# Patient Record
Sex: Male | Born: 1945 | Race: White | Hispanic: No | Smoking: Never smoker
Health system: Southern US, Community
[De-identification: ages and names within clinical notes are randomized; demographics above are authoritative.]

## PROBLEM LIST (undated history)

## (undated) DIAGNOSIS — C801 Malignant (primary) neoplasm, unspecified: Secondary | ICD-10-CM

## (undated) DIAGNOSIS — Z973 Presence of spectacles and contact lenses: Secondary | ICD-10-CM

## (undated) DIAGNOSIS — G8929 Other chronic pain: Secondary | ICD-10-CM

## (undated) DIAGNOSIS — I10 Essential (primary) hypertension: Secondary | ICD-10-CM

## (undated) DIAGNOSIS — H269 Unspecified cataract: Secondary | ICD-10-CM

## (undated) DIAGNOSIS — G43909 Migraine, unspecified, not intractable, without status migrainosus: Secondary | ICD-10-CM

## (undated) DIAGNOSIS — Z8489 Family history of other specified conditions: Secondary | ICD-10-CM

## (undated) DIAGNOSIS — I509 Heart failure, unspecified: Secondary | ICD-10-CM

## (undated) DIAGNOSIS — M545 Low back pain, unspecified: Secondary | ICD-10-CM

## (undated) DIAGNOSIS — I2510 Atherosclerotic heart disease of native coronary artery without angina pectoris: Secondary | ICD-10-CM

## (undated) DIAGNOSIS — G2581 Restless legs syndrome: Secondary | ICD-10-CM

## (undated) DIAGNOSIS — M1990 Unspecified osteoarthritis, unspecified site: Secondary | ICD-10-CM

## (undated) DIAGNOSIS — E7800 Pure hypercholesterolemia, unspecified: Secondary | ICD-10-CM

## (undated) HISTORY — PX: EYE SURGERY: SHX253

## (undated) HISTORY — PX: LUMBAR DISC SURGERY: SHX700

## (undated) HISTORY — PX: KNEE ARTHROSCOPY: SUR90

## (undated) HISTORY — PX: COLONOSCOPY: SHX174

## (undated) HISTORY — PX: BACK SURGERY: SHX140

## (undated) HISTORY — PX: JOINT REPLACEMENT: SHX530

## (undated) HISTORY — PX: FRACTURE SURGERY: SHX138

## (undated) HISTORY — PX: ANTERIOR CERVICAL DECOMP/DISCECTOMY FUSION: SHX1161

## (undated) HISTORY — PX: ANTERIOR CERVICAL DISCECTOMY: SHX1160

---

## 1999-04-22 ENCOUNTER — Encounter: Admission: RE | Admit: 1999-04-22 | Discharge: 1999-07-12 | Payer: Self-pay | Admitting: Specialist

## 1999-07-15 ENCOUNTER — Encounter: Payer: Self-pay | Admitting: Specialist

## 1999-07-15 ENCOUNTER — Ambulatory Visit (HOSPITAL_COMMUNITY): Admission: RE | Admit: 1999-07-15 | Discharge: 1999-07-15 | Payer: Self-pay | Admitting: Specialist

## 1999-07-29 ENCOUNTER — Ambulatory Visit (HOSPITAL_COMMUNITY): Admission: RE | Admit: 1999-07-29 | Discharge: 1999-07-29 | Payer: Self-pay | Admitting: Specialist

## 1999-07-29 ENCOUNTER — Encounter: Payer: Self-pay | Admitting: Specialist

## 2000-02-07 ENCOUNTER — Encounter: Payer: Self-pay | Admitting: Neurology

## 2000-02-07 ENCOUNTER — Ambulatory Visit (HOSPITAL_COMMUNITY): Admission: RE | Admit: 2000-02-07 | Discharge: 2000-02-07 | Payer: Self-pay | Admitting: Neurology

## 2002-07-15 ENCOUNTER — Encounter: Payer: Self-pay | Admitting: Specialist

## 2002-07-18 ENCOUNTER — Encounter: Payer: Self-pay | Admitting: Specialist

## 2002-07-18 ENCOUNTER — Inpatient Hospital Stay (HOSPITAL_COMMUNITY): Admission: RE | Admit: 2002-07-18 | Discharge: 2002-07-19 | Payer: Self-pay | Admitting: Specialist

## 2003-11-18 ENCOUNTER — Encounter: Admission: RE | Admit: 2003-11-18 | Discharge: 2003-11-18 | Payer: Self-pay | Admitting: Specialist

## 2004-02-01 ENCOUNTER — Encounter: Admission: RE | Admit: 2004-02-01 | Discharge: 2004-02-01 | Payer: Self-pay | Admitting: Specialist

## 2004-11-18 ENCOUNTER — Ambulatory Visit (HOSPITAL_COMMUNITY): Admission: RE | Admit: 2004-11-18 | Discharge: 2004-11-19 | Payer: Self-pay | Admitting: Specialist

## 2005-09-05 ENCOUNTER — Encounter: Admission: RE | Admit: 2005-09-05 | Discharge: 2005-09-05 | Payer: Self-pay | Admitting: Specialist

## 2006-01-19 ENCOUNTER — Inpatient Hospital Stay (HOSPITAL_COMMUNITY): Admission: RE | Admit: 2006-01-19 | Discharge: 2006-01-20 | Payer: Self-pay | Admitting: Specialist

## 2006-04-11 ENCOUNTER — Ambulatory Visit (HOSPITAL_COMMUNITY): Admission: RE | Admit: 2006-04-11 | Discharge: 2006-04-11 | Payer: Self-pay | Admitting: Specialist

## 2006-08-25 ENCOUNTER — Encounter: Admission: RE | Admit: 2006-08-25 | Discharge: 2006-08-25 | Payer: Self-pay | Admitting: Specialist

## 2006-12-25 ENCOUNTER — Encounter: Admission: RE | Admit: 2006-12-25 | Discharge: 2006-12-25 | Payer: Self-pay | Admitting: Specialist

## 2007-01-25 ENCOUNTER — Encounter: Admission: RE | Admit: 2007-01-25 | Discharge: 2007-01-25 | Payer: Self-pay | Admitting: Family Medicine

## 2007-02-06 ENCOUNTER — Encounter: Admission: RE | Admit: 2007-02-06 | Discharge: 2007-02-06 | Payer: Self-pay | Admitting: Specialist

## 2007-02-08 ENCOUNTER — Inpatient Hospital Stay (HOSPITAL_COMMUNITY): Admission: RE | Admit: 2007-02-08 | Discharge: 2007-02-14 | Payer: Self-pay | Admitting: Specialist

## 2007-02-08 HISTORY — PX: MAXIMUM ACCESS (MAS)POSTERIOR LUMBAR INTERBODY FUSION (PLIF) 3 LEVEL: SHX6370

## 2007-06-18 ENCOUNTER — Encounter: Admission: RE | Admit: 2007-06-18 | Discharge: 2007-07-03 | Payer: Self-pay | Admitting: Specialist

## 2007-07-10 ENCOUNTER — Encounter: Admission: RE | Admit: 2007-07-10 | Discharge: 2007-08-12 | Payer: Self-pay | Admitting: Specialist

## 2007-07-15 ENCOUNTER — Encounter: Admission: RE | Admit: 2007-07-15 | Discharge: 2007-08-12 | Payer: Self-pay | Admitting: Specialist

## 2007-09-17 ENCOUNTER — Encounter: Admission: RE | Admit: 2007-09-17 | Discharge: 2007-11-19 | Payer: Self-pay | Admitting: Specialist

## 2008-03-05 ENCOUNTER — Encounter: Admission: RE | Admit: 2008-03-05 | Discharge: 2008-03-05 | Payer: Self-pay | Admitting: Specialist

## 2008-10-02 ENCOUNTER — Encounter: Admission: RE | Admit: 2008-10-02 | Discharge: 2008-10-02 | Payer: Self-pay | Admitting: Family Medicine

## 2009-05-11 ENCOUNTER — Encounter: Admission: RE | Admit: 2009-05-11 | Discharge: 2009-05-11 | Payer: Self-pay | Admitting: Specialist

## 2009-08-05 ENCOUNTER — Encounter
Admission: RE | Admit: 2009-08-05 | Discharge: 2009-09-27 | Payer: Self-pay | Admitting: Physical Medicine & Rehabilitation

## 2009-08-10 ENCOUNTER — Ambulatory Visit: Payer: Self-pay | Admitting: Physical Medicine & Rehabilitation

## 2009-09-27 ENCOUNTER — Ambulatory Visit: Payer: Self-pay | Admitting: Physical Medicine & Rehabilitation

## 2009-10-28 ENCOUNTER — Ambulatory Visit: Payer: Self-pay | Admitting: Physical Medicine & Rehabilitation

## 2009-10-28 ENCOUNTER — Encounter
Admission: RE | Admit: 2009-10-28 | Discharge: 2009-10-28 | Payer: Self-pay | Admitting: Physical Medicine & Rehabilitation

## 2009-11-22 ENCOUNTER — Encounter
Admission: RE | Admit: 2009-11-22 | Discharge: 2009-11-25 | Payer: Self-pay | Admitting: Physical Medicine & Rehabilitation

## 2009-11-25 ENCOUNTER — Ambulatory Visit: Payer: Self-pay | Admitting: Physical Medicine & Rehabilitation

## 2010-02-14 ENCOUNTER — Encounter
Admission: RE | Admit: 2010-02-14 | Discharge: 2010-02-14 | Payer: Self-pay | Admitting: Physical Medicine & Rehabilitation

## 2010-04-19 ENCOUNTER — Inpatient Hospital Stay (HOSPITAL_COMMUNITY): Admission: RE | Admit: 2010-04-19 | Discharge: 2010-04-22 | Payer: Self-pay | Admitting: Specialist

## 2010-05-17 ENCOUNTER — Encounter
Admission: RE | Admit: 2010-05-17 | Discharge: 2010-06-29 | Payer: Self-pay | Source: Home / Self Care | Attending: Specialist | Admitting: Specialist

## 2010-07-05 ENCOUNTER — Encounter
Admission: RE | Admit: 2010-07-05 | Discharge: 2010-08-01 | Payer: Self-pay | Source: Home / Self Care | Attending: Specialist | Admitting: Specialist

## 2010-07-24 ENCOUNTER — Encounter: Payer: Self-pay | Admitting: Specialist

## 2010-08-29 ENCOUNTER — Ambulatory Visit: Payer: Medicare Other | Attending: Specialist | Admitting: Physical Therapy

## 2010-08-29 DIAGNOSIS — M25519 Pain in unspecified shoulder: Secondary | ICD-10-CM | POA: Insufficient documentation

## 2010-08-29 DIAGNOSIS — M25619 Stiffness of unspecified shoulder, not elsewhere classified: Secondary | ICD-10-CM | POA: Insufficient documentation

## 2010-08-29 DIAGNOSIS — IMO0001 Reserved for inherently not codable concepts without codable children: Secondary | ICD-10-CM | POA: Insufficient documentation

## 2010-09-06 ENCOUNTER — Ambulatory Visit: Payer: Medicare Other | Attending: Specialist | Admitting: Physical Therapy

## 2010-09-06 DIAGNOSIS — M25619 Stiffness of unspecified shoulder, not elsewhere classified: Secondary | ICD-10-CM | POA: Insufficient documentation

## 2010-09-06 DIAGNOSIS — IMO0001 Reserved for inherently not codable concepts without codable children: Secondary | ICD-10-CM | POA: Insufficient documentation

## 2010-09-06 DIAGNOSIS — M25519 Pain in unspecified shoulder: Secondary | ICD-10-CM | POA: Insufficient documentation

## 2010-09-09 ENCOUNTER — Ambulatory Visit: Payer: Medicare Other | Admitting: Physical Therapy

## 2010-09-14 LAB — CBC
HCT: 32 % — ABNORMAL LOW (ref 39.0–52.0)
Hemoglobin: 10.7 g/dL — ABNORMAL LOW (ref 13.0–17.0)
Hemoglobin: 9.7 g/dL — ABNORMAL LOW (ref 13.0–17.0)
MCH: 31.2 pg (ref 26.0–34.0)
MCHC: 32.8 g/dL (ref 30.0–36.0)
MCHC: 32.9 g/dL (ref 30.0–36.0)
MCV: 94.9 fL (ref 78.0–100.0)
Platelets: 152 10*3/uL (ref 150–400)
Platelets: 185 10*3/uL (ref 150–400)
RBC: 3.11 MIL/uL — ABNORMAL LOW (ref 4.22–5.81)
RBC: 3.41 MIL/uL — ABNORMAL LOW (ref 4.22–5.81)
RDW: 13.2 % (ref 11.5–15.5)
WBC: 11.1 10*3/uL — ABNORMAL HIGH (ref 4.0–10.5)
WBC: 17.7 10*3/uL — ABNORMAL HIGH (ref 4.0–10.5)

## 2010-09-14 LAB — URINALYSIS, ROUTINE W REFLEX MICROSCOPIC
Glucose, UA: NEGATIVE mg/dL
Protein, ur: 30 mg/dL — AB
Specific Gravity, Urine: 1.019 (ref 1.005–1.030)
Urobilinogen, UA: 0.2 mg/dL (ref 0.0–1.0)

## 2010-09-14 LAB — HEMOGLOBIN A1C
Hgb A1c MFr Bld: 5.6 % (ref <5.7)
Mean Plasma Glucose: 114 mg/dL (ref <117)

## 2010-09-14 LAB — URINE CULTURE: Colony Count: NO GROWTH

## 2010-09-14 LAB — BASIC METABOLIC PANEL
Calcium: 7.8 mg/dL — ABNORMAL LOW (ref 8.4–10.5)
Creatinine, Ser: 1.02 mg/dL (ref 0.4–1.5)
GFR calc Af Amer: >60 mL/min (ref >60)
GFR calc non Af Amer: >60 mL/min (ref >60)
Sodium: 137 mEq/L (ref 135–145)

## 2010-09-14 LAB — PROTIME-INR
INR: 1.06 (ref 0.00–1.49)
INR: 1.2 (ref 0.00–1.49)
INR: 1.88 — ABNORMAL HIGH (ref 0.00–1.49)
Prothrombin Time: 15.4 seconds — ABNORMAL HIGH (ref 11.6–15.2)

## 2010-09-14 LAB — URINE MICROSCOPIC-ADD ON

## 2010-09-15 LAB — COMPREHENSIVE METABOLIC PANEL
ALT: 21 U/L (ref 0–53)
AST: 24 U/L (ref 0–37)
CO2: 29 mEq/L (ref 19–32)
Calcium: 9 mg/dL (ref 8.4–10.5)
Chloride: 107 mEq/L (ref 96–112)
GFR calc Af Amer: >60 mL/min (ref >60)
GFR calc non Af Amer: >60 mL/min (ref >60)
Potassium: 4 mEq/L (ref 3.5–5.1)
Sodium: 141 mEq/L (ref 135–145)
Total Bilirubin: 0.8 mg/dL (ref 0.3–1.2)

## 2010-09-15 LAB — DIFFERENTIAL
Eosinophils Absolute: 0.2 10*3/uL (ref 0.0–0.7)
Eosinophils Relative: 3 % (ref 0–5)
Lymphs Abs: 2.2 10*3/uL (ref 0.7–4.0)

## 2010-09-15 LAB — URINALYSIS, ROUTINE W REFLEX MICROSCOPIC
Glucose, UA: NEGATIVE mg/dL
Hgb urine dipstick: NEGATIVE
Specific Gravity, Urine: 1.011 (ref 1.005–1.030)

## 2010-09-15 LAB — SURGICAL PCR SCREEN
MRSA, PCR: NEGATIVE
Staphylococcus aureus: POSITIVE — AB

## 2010-09-15 LAB — CBC
Hemoglobin: 13 g/dL (ref 13.0–17.0)
RBC: 4.13 MIL/uL — ABNORMAL LOW (ref 4.22–5.81)
WBC: 7.3 10*3/uL (ref 4.0–10.5)

## 2010-11-15 NOTE — Op Note (Signed)
Brian Gallegos, Brian Gallegos            ACCOUNT NO.:  [id number]   MEDICAL RECORD NO.:  [id number]          PATIENT TYPE:  INP   LOCATION:  2550                         FACILITY:  [facility]   PHYSICIAN:  Kerrin Champagne, M.D.   DATE OF BIRTH:  [birth year]   DATE OF PROCEDURE:  02/08/2007  DATE OF DISCHARGE:                               OPERATIVE REPORT   PREOPERATIVE DIAGNOSES:  1. 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  2. Left L4-5 recurrent disk protrusion with foraminal stenosis and      foraminal component to disk protrusion.  3. Right L3-4 herniated nucleus pulposus far lateral and      intraforaminal.  4. Left L5-S1 lateral recess narrowing.  5. Small disk degenerative tear, left L5-S1.   POSTOPERATIVE DIAGNOSES:  1. 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  2. Left L4-5 recurrent disk protrusion with foraminal stenosis and      foraminal component to disk protrusion.  3. Right L3-4 herniated nucleus pulposus far lateral and      intraforaminal.  4. Left L5-S1 lateral recess narrowing.  5. Small disk degenerative tear, left L5-S1.   PROCEDURE:  1. Right sided L3-4 transforaminal lumbar interbody fusion using the 9      mm DePuy Concord cage, local bone graft.  2. Left L2-3 transforaminal lumbar interbody fusion using a 9 mm      Concord DePuy cage with local bone graft.  3. Left L4-5 transforaminal lumbar interbody fusion using a 10 mm      DePuy Concord cage with local bone graft.  4. Left L5-S1 lateral recess decompression, posterolateral fusion L2      to L5 (three levels).  5. Internal fixation with pedicle screws and rods, L2 to L5, total of      three segments.  DePuy Monarch pedicle screws and rods used.  6. Single cross clamp at the L3-4 level.   SURGEON:  Kerrin Champagne, M.D.   ASSISTANT:  Wende Neighbors, P.A.   ANESTHESIA:  General via oral tracheal intubation, Dr. Gypsy Balsam.   FINDINGS:  As above, left L2-3 and left L4-5  foraminal entrapment,  recurrent disk protrusion left L4-5 lateral recess.  Right L3-4  foraminal disk protrusion with stenosis.  Left L5-S1 lateral recess  narrowing.   SPECIMENS:  None.   DISPOSITION:  Not applicable.   ESTIMATED BLOOD LOSS:  750 mL.   The Cell Saver blood returned 2-1/2 units.   COMPLICATIONS:  None.   The patient's intraoperative neural monitoring indicated soft tissue  resistance for the pedicle screws greater than 30 at the L2 level, L3  level and right L5 level.  The L4 level at 28 on both sides and on the  left side L5 22, all greater than 8 indicating normal resistance for  pedicle screw placement.  No intraoperative neural monitoring  abnormality noted during this case.   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  He has undergone numerous  surgical procedures including left sided L3-4 extraforaminal approach to  excision  of HNP, multiple L4-5 disk excisions for recurring herniated  nucleus pulposus, a left sided L5-S1 diskectomy in the past and a disk  protrusion which has been treated conservatively on the left side at the  L2-3 level.  The patient has been experiencing worsening pain and disk  comfort following an injury on the job.  Pain has persisted into his  left lower extremity despite having undergone a recent repeat excision  of herniated nucleus pulposus felt to be recurrent along the left side  at the L4-5 level.  He has required continued narcotic medicines, did  return to work at his usual job duties, however, he has required  persistent use of medicines in the form of strong narcotic medicines and  has shown a tendency to escalation of these medicines.  It is apparent  on his follow-up studies that he has severe foraminal entrapment that is  present on the left side at both L2-3 and L4-5 on the right side at the  L3-4 level.  These are in such areas that disk excision  alone does not  appear to be an option and the patient will likely require excision of  joints in order to undergo excision of disk herniation and foraminal  decompression at each segment.  We therefore recommended that he go  ahead with a three level transforaminal lumbar interbody fusion  procedure with disk excision and foraminal decompression at each segment  in the areas of foraminal disk protrusion, that would be in the left L2-  3, left L4-5, and right L3-4.  His studies dating into June do indicate  some lateral recess narrowing on the left side at the L5-S1 level.   This being the case, I recommended that he go ahead and have lateral  recess decompression on this side, however, as he does not have any  significant foraminal component or disk herniation noted on his studies,  no fusion is felt to be necessary.  It is also felt that this segment is  stabilized by numerous ligaments and to include it in the fusion mass  would decrease his ability to compensate for a three level fusion.   DESCRIPTION OF PROCEDURE:  After adequate general anesthesia, with the  patient in a prone position, the [place] table frame was used.  A Foley  catheter was placed.  He also had an arterial line placed and several  venous IV access.   The patient had preoperative antibiotics.  Neural monitoring leads were  placed.  He had TED compression stockings and PAS hose.  Standard prep  with DuraPrep solution from the mid dorsal spine to the sacral level,  draped in the usual manner, and iodine body drape was used.   The incision was made ellipsing the old incision scar extending from L2  to S1 after infiltration with Marcaine 0.5% with 1:200,000 epinephrine.  This was carried down to the lumbodorsal fascia.  The incision was  carried superiorly to the L2 level.  The lumbodorsal fascia was then  incised on both sides of the spinous processes extending from L1 down to  S1.  Cobbs used to elevate the  paralumbar muscles bilaterally from L1  down to S1.  Initially, cerebellar retractors were used.  Bleeders  controlled using electrocautery.  The exposure was carried out over the  lateral aspects of the facets after first applying clamps to the  expected spinous processes of L3 and L4.  Intraoperative C-arm  fluoroscopy used to assess these levels and determine  they are to be the  correct levels.  Each were then marked with a single suture to continue  their identification for the remainder of the case.   Cobbs were then used to further elevate the paralumbar muscles removing  the facet capsules at the L4-5 level, L3-4 level and L2-3 level  bilaterally, preserving the capsules at the L1-2 level.  The exposure  was carried out laterally to the tips of the transverse processes to the  L2, L3, L4 and L5, these being at the L1-2 facet level, L2-3, L3-4 and  L4-5 facet levels.  Bleeders controlled using bipolar and monopolar  electrocautery.  The Viper retractor was inserted and good retraction  was obtained and excellent exposure.  Each of the gutters were then  packed with sponges.  An osteotome was then used to resect the inferior  articular process on the right side at the L3 level and then the  superior articular process at L4 and then resect the ligamentum flavum  extended from the medial aspect and superior articular process of L4 in  its entirety.  This allowed access into the foramen on the right side at  L3-4.   Bone was morcellized to be used for bone graft later on in the  procedure.  Care was taken to remove the entire superior articular  process of L4 in order to decompress the neuroforamen so that the disk  space could be entered in order to perform both diskectomy as well as  placement of the transforaminal lumbar interbody fusion cage.  Thecal  sac was mobilized medially and the L3 nerve root mobilized superiorly  such that the disk could be excised in its entirety.  The  15-blade  scalpel was used to incise the disk and pituitary rongeurs used to  debride the disk of intravertebral disk material.  Care was taken to  ensure all disk was debrided and careful exploration down laterally  beneath the L3 nerve root to ensure that there was no disk material  fragments extending laterally.  The L3 nerve root was completely  decompressed at this point.  Thrombin soaked Gelfoam was placed into the  lateral recess in this eye and then attention turned to the left side,  where similarly, facetectomies were carried down on the left side at L2-  3 and L4-5, resecting the inferior articular process of L2 and of L4 on  the left side and then the superior articular process overlying the  neuroforamen at the superior articular process of L5 and of L3.  The  ligamentum flavum was excised in its entirety over the left side.  Both  of these segments decompressing the lateral aspect of the thecal sac and  the neuroforamen for the left L2 nerve root and the left L4 nerve root.  The nerve roots were mobilized superiorly and the thecal sac retracted  medially using Penfield 4 and D'Erricos.  The disk space is identified,  bipolar electrocautery used to control small venous bleeders and then an  incision made into the disk using 15 blade scalpel in both segments.  The disk was excised using pituitary rongeurs.  Thrombin soaked Gelfoam  then placed.  Attention turned to the left L5-S1 level where a Boss  McCullough retractor was inserted in order to lift the soft tissues away  from this area.  A curet used to carefully define the borders of the  left L5-S1 level.  The patient then had a 3-mm Kerrison used to remove a  small portion of the anterior aspect of the lamina at L5 on the left  side up to the insertion of ligamentum flavum.  This was then accessed  and lifted and excised off of the thecal sac on the left side.  The  ligamentum flavum was excised off of the medial aspect of  the L5-S1  facet decompressing up to the very top of the facet where on MRI it was  noted to show some impression on the thecal sac in the L5 and S1 nerve  roots.  This decompressed the lateral recess well to the level of the  medial aspect of the S1 pedicle.  The S1 nerve root now felt to be  actually without compromise.  A hockey stick neural probe could be  passed out each of these neuroforamen without difficulty.  Thrombin  soaked Gelfoam then placed over the posterior aspect of this area of  lateral recess decompression.  Attention turned then to placement of  pedicle screws.   This was first done on the left side in which pedicle screws were placed  at the L2, L3, L4 and L5 levels, first at the L2 level using an awl to  make an entry point at the intersection of the transverse process with  lateral aspect of the patient's pedicle at this level at the region of  the superior articular process of L2.  Observing on C-arm correct  alignment position of this, then using a straight pedicle finder to  probe the pedicle, the ball tip probe used to probe the channel ensuring  its patency.  There was no evidence of ruptured cortex.  Tapping was  performed using a 5.5 tap and a 6.25 screw was chosen 40 mm in length  judged by C-arm fluoroscopy with the straight pedicle finder in place.  Note that prior to placement of the pedicle screw, a bone marrow  aspirate was then obtained for a VITOSS charging using 10-mm strips of  VITOSS, 10 mm of bone marrow was aspirated from the left L2 vertebral  body.  This was done using the Jamshidi-type needle.  With this  completed, then the 40 mm x 6.25 screw was placed after decorticating  the transverse process, charging the VITOSS and placing VITOSS over the  transverse process to corticate.  The screw was then placed without  difficulty.  In a similar fashion, this was carried out then on the left  side at L3, L4 and L5.  Each step of the way, the ball  tip probe is used  to probe the channel after using the pedicle screw, pedicle finder to  probe the pedicle and also after using the tapping device.  At L3, a 40  mm x 625 screw was also used.  Good decortication also was carried out  at this point prior to placement of the screw.  Further bone marrow  aspirate was placed on VITOSS aspirating from the left L3 vertebral  body.   After completion of the aspiration, tapping was performed using 1  o'clock tap and then placing a 40 x 6.25 screw.  This completed the  fixation of the L3 level on the left and then noted again that similarly  the same procedure was carried out on the left at L4 pedicle and then L5  pedicle.  Each of them checking with C-arm fluoroscopy as well as ball  tip probe to ensure patencies of the pedicles before and after tapping.  At the L4 and L5 levels,  a 7.0 screw was used and this was done without  difficulty.  Then on the right side similarly, this was performed  exactly the opposite of the left side using an awl for initial  penetration of the lateral cortex of the pedicle, pedicle finder to  probe the pedicle using C-arm fluoroscopy to ascertain correct position  alignment using a ball tip probe to check for patency of the pedicle and  ensure no broaching of the cortex, tapping appropriate sized tap 1 size  smaller, then using the appropriate size screw each at 40 mm in length  were placed.  Decortication of the transverse process were carried out  at each level and VITOSS with a total of 20 mL was used, 10 mL on each  side, the strips cut side to side in 3 to 4 mm widths.  This completed  the screw placement so that intraoperative testing was performed for  soft tissue resistance using the intraoperative neural monitoring.  This  was done without difficulty.  The values greater than 30 at L2 both  side, greater than 30 at L3 both sides, greater than 30 on the right  side at L5, 28 bilaterally at L4 and 22 on the  left side at L5.  These  were all greater than 8 indicating correct soft tissue resistance.  Intraoperatively, there appeared to be no significant nerve changes  during the case.   With this completed, attention was turned to perform the TLIF, first on  the right side at the L3-4 level.  On this level, the thecal sac had already been retracted and the nerve root retracted superiorly and these  were then retracted again using the D'Errico retractor at L4.  Pituitary  rongeurs used to debride the disk space.  The disk space was then  dilated using 8 and 9 mm dilators.  Additional 10-mm dilator was  inserted, however, it was felt that this was too tight and a 9-mm cage  was chosen.  The disk space after dilation was then debrided using  pituitary rongeurs straight and upbiting and then the end plates  debrided of cartilaginous end plate material using first a straight  curet on both sides and then the upbiting to the right and upbiting to  the left curets.  Green curets were then used first straight and then  the upbiting on both sides to further debride the end plates of  cartilaginous material.  With this completed, then trial was performed  using 8 and then 9 mm trial.  The 9 gave the most significant and the  best fit.  The 9-mm cage again had been packed.  Additional local bone  graft had been harvested from facetectomies and placed within the  intravertebral disk space.  Then the [facility] 9-mm cage was then directed  into the disk space about 35 degrees of convergence and then impacted  into place.  Care was taken to ensure the cage was in the correct  position alignment with the teeth towards the end plates on both sides.  This was a parallel type of implant.  Then bleeders controlled using  bipolar electrocautery and thrombin soaked Gelfoam placed.  Care was  taken to ensure the L3 nerve root was exiting without any further nerve  compression within the neuroforamen.  The L4 nerve  root also appeared to  be well decompressed as it passed along the medial aspect of the L4  pedicle.   Attention then turned to the left side  where the left L4-5 TLIF was  performed, again, retracting the thecal sac at the L4-5 level and the  left L4 nerve root was mobilized superiorly.  The disk had previously  been incised.  Pituitary rongeur used to debride the disk of  intervertebral disk material.  The disk space was then dilated, first to  8, then 9, then 10, then 11 mm.  Eventually a 10-mm cage was chosen.  This was done after previous curetting the disk space of cartilaginous  end plate material using the straight curets, upbiting right and  upbiting left curets as well as ring curets straight and upbiting.  A  pituitary rongeur was used to debride the intervertebral disk space of  all cartilaginous end plate material and degenerative disk material and  trial performed using 8, 9, 10 and then an 11-mm trial and the 11 mm is  felt to be the excessive.  The 10-mm implant was chosen.  The implant  was packed with disk material obtained from the previous facetectomies.  Additional bone graft was placed within the intervertebral disk space  from a previous facetectomy.  The cage was then placed over the  neuroforamen and this was a lordotic type of cage.  The missing tooth  placed medially and the cage then impacted into place with the teeth  facing superiorly and inferiorly as appropriate.  This was subset  beneath the posterior aspect of the disk space without difficulty at  about a 35 degree angle.  Care was taken to ensure there was no bone  within the area of the insertion of the cage from bone placement  previously.  The L4 nerve root was noted to be exiting without further  nerve compression using a hockey stick nerve probe to demonstrate the  patency of the neuroforamen as well as the L5 nerve root as it passed  medial to the L5 pedicle out the L5 neuroforamen.   Attention  then turned to the left L2-3 TLIF.  The thecal sac had been  retracted medially as well as superiorly the L2 nerve root previously  and disk excised.  The disk was further excised and then the dilatation  of the disk space carried out 8, 9 and then 10 mm.  Pituitary rongeurs  were used to debride the disk further and then curettage carried out.  Trial was then carried out with 8 mm and 9 mm.  The 9 mm provided  excellent fit, no further distraction was necessary and then we stayed  with the 9 mm Concord cage.  This was packed with local bone graft  material.  The intravertebral disk space had been debrided using  curettage, straight as well as upbiting to the right and upbiting to the  left curets as well as ring curets straight and upbiting.  The disk  space was then packed with further bone graft material.  The cage was  placed over the disk space and impacted into place at a 35 degree angle.  This completed the placement of the TLIF cages at all of the three  levels.  Care was taken to ensure there was no bony debris within the  intravertebral canal within the posterior aspect of the disk space.  Each of the nerve roots again noted to be exiting freely.  There is no  significant bleeding present.  The 95 mm length precontoured rods were  then placed within the fasteners and the screws from L2 to L5 both  sides.  Each  of these fasteners had been loosened with the correct head  breaker.  The 95-mm rods appeared to fit perfect into the fasteners,  first on the left side, then the right side.  On the left side, the cap  was placed at L2, L3, L4 and L5.  At the L2 level, the cap was tightened  to 80 foot pounds using a torque device.  Then compression obtained  between the L2 level and L3 level on the left side and the cap tightened  at the L3 level then to 80 foot pounds.  The right rod was then placed  and each of the caps  placed from L2 to L5 and again the rod position  and good position  alignment lordosis anterior and then the L2 cap was  tightened to 80 foot pounds connecting the fastener to the rod.  With  this completed, then compression obtained between the L2 and L3  fasteners and the L3 fastener was then tightened to 80 foot pounds.  On  the right side, then the compression was obtained between the L3 and L4  fasteners and the L4 fastener cap was tightened to 80 foot pounds.  Then  on the left side, similarly, this was done between L3 and L4 compressing  and then tightening the L4 cap to 80 foot pounds.  Then on the left side  at the L4-5 level, compression obtained between the L4 and L5 fasteners  and the L5 fastener cap was then tightened to 80 foot pounds.  Similarly  on the right side, compression obtained between the L4 and L5 fasteners  and the L5 fastener was then tightened to 80 foot pounds.  Irrigation  was further carried out at this point.  An additional gram of Ancef was  provided as the case had gone almost 5 hours at this point.  Following  irrigation, Leksell rongeur was used to remove the small portion of the  inferior aspect of the spinous process of L3 at the L3-4 interspace and  the device used for measuring the cross-link was used to do such and a  #6 cross-link was chosen.  The cross-link was then attached to the right  transverse rod as well as the left transverse rod, held in place and  then tightened using the torque device to the rods on both sides.  Similarly, then the central screw for the central legs of the cross- clamp were then tightened the appropriate torque.  This completed the  fixation portion of the case.  Any additional remaining local bone graft  was then placed over the posterior-lateral regions both on the right  side and left side.  Medium Hemovac drain was placed in the depth of the  incision exiting over the left lower lumbar spine.  After further  irrigation, debridement was carried out over the paralumbar muscles both   sides and the detrusor tissue did not appear to be viable.  Lumbodorsal  fascia was then approximated to the interspinous ligaments and the  supraspinous ligaments with interrupted #1 Vicryl sutures both  superiorly and inferiorly in the midline of the incision.  Deep  subcutaneous layers were then approximated with interrupted #1 and 0  Vicryl sutures, more superficial layers with interrupted 2-0 Vicryl  sutures, skin closed with a running subcutaneous stitch of 4-0 Vicryl.  Tincture of benzoin and Steri-Strips applied, 4 x 4's, ABD pad affixed  to the skin with Hypafix tape.  Note that intraoperative C-arm  fluoroscopy used obtained  following the cross-link demonstrating  excellent position alignment of hardware.  Pedicle screws and rods all  appeared to be in good position alignment as did the TLIFs at each  level.  Marker was placed on the right side during the film performed.  Following applying dressing, 4 x 4's, ABD pads  fixed to the skin with Hypafix tape, the Hemovac charged and taped to  the skin with a pink tape.  The patient was then returned to the supine  position, reactivated, extubated and returned to the recovery room in  satisfactory condition.  All the sponge counts were correct.      Kerrin Champagne, M.D.  Electronically Signed     JEN/MEDQ  D:  02/08/2007  T:  02/09/2007  Job:  [id number]

## 2010-11-18 NOTE — Discharge Summary (Signed)
Brian Gallegos, Brian Gallegos            ACCOUNT NO.:  [id number]   MEDICAL RECORD NO.:  [id number]          PATIENT TYPE:  INP   LOCATION:  5022                         FACILITY:  [facility]   PHYSICIAN:  Kerrin Champagne, M.D.   DATE OF BIRTH:  [birth year]   DATE OF ADMISSION:  02/08/2007  DATE OF DISCHARGE:  02/14/2007                               DISCHARGE SUMMARY   ADMISSION DIAGNOSES:  1. 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  2. Left L4-5 recurrent disk protrusion with foraminal stenosis and      foraminal component to disk protrusion.  3. Right L3-4 herniated nucleus pulposus, far lateral and      intraforaminal.  4. Left L5-S1 lateral recess narrowing.  5. Small disk degenerative tear, left L5-S1.  6. History of migraine headaches.  7. Benign prostatic hypertrophy.  8. History of venous stasis with recurring lower extremity edema,      possible peripheral vascular disease.  9. Hypercholesterolemia.   DISCHARGE DIAGNOSES:  1. 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  2. Left L4-5 recurrent disk protrusion with foraminal stenosis and      foraminal component to disk protrusion.  3. Right L3-4 herniated nucleus pulposus, far lateral and      intraforaminal.  4. Left L5-S1 lateral recess narrowing.  5. Small disk degenerative tear, left L5-S1.  6. History of migraine headaches.  7. Benign prostatic hypertrophy.  8. History of venous stasis with recurring lower extremity edema,      possible peripheral vascular disease.  9. Hypercholesterolemia.  10.Posthemorrhagic anemia.  11.Postoperative constipation, resolved prior to discharge.   PROCEDURES:  1. On February 08, 2007, the patient underwent right-sided, L3-4,      transforaminal lumbar interbody fusion using local bone graft and      DePuy Concord cage.  2. Left L2-3 transforaminal lumbar interbody fusion, Concord DePuy      cage and local bone graft.  3. Left L4-5 transforaminal lumbar interbody fusion using  DePuy      Concord cage and local bone graft.  4. Left L5-S1 lateral recess decompression and posterolateral fusion      L2-5, three levels.  5. Internal fixation with pedicle screws and rods at L2-5, total of      three segments.  DePuy Monarch pedicle screws and rods used.  6. Single crossclamp at the L3-4 level performed by Dr. Otelia Sergeant,      assisted by Maud Deed, PA-C, under general anesthesia.   CONSULTATIONS:  None.   HISTORY OF PRESENT ILLNESS:  The patient is a 65 year old male with  previous surgical procedures of the lumbar spine including left-sided,  L3-4, extraforaminal approach to excision of HNP as well as multiple L4-  5 disk excisions for recurring herniated nucleus pulposus and a left-  sided, L5-S1 diskectomy and also, conservative treatment for a left-  sided L2-3 disk protrusion.  He has had worsening of his back pain  following an injury on the job.  He has had continual left lower  extremity pain despite recent repeat excision of herniated nucleus  pulposus felt to be recurrent along the left side at  the L4-5 level.  He  now is unable to continue his usual job and has begun requiring  continued narcotic medications.  Followup studies have shown severe  foraminal entrapment at left L2-3 and L4-5 and on the right at L3-4.  It  was felt that he would best benefit from a three-level transforaminal  lumbar interbody fusion procedure with disk excision and foraminal  decompression at each segment in the area of foraminal disk protrusion.  It was felt that he would benefit from the procedure as stated above.  The patient wished to proceed and was admitted for this procedure.   HOSPITAL COURSE:  The patient tolerated the procedure under general  anesthesia without complications.  Postoperatively, neurovascular  function of the lower extremities was noted to be intact.  Hemovac drain  was discontinued on the first postoperative day.  He had daily dressing  changes  thereafter.  He did have small amounts of bloody and serous  drainage from his wound throughout the hospital stay.  The wound was  cleansed with Betadine on a daily basis.  Steri-Strips were reapplied on  the fourth postoperative day.  Steadily, the amount of drainage did  decrease.  No purulence or erythema about the wound during the hospital  stay.  The patient was started on physical therapy for ambulation and  gait training.  He was taught donning and doffing of the Aspen LSO and  was able to do this independently at discharge.  At the time of  discharge, he was ambulating utilizing a walker.  The patient received  occupational therapy for ADLs and was independent with these at  discharge.  He did develop significant constipation with nausea and  vomiting associated with this on postop day #5.  Eventually, he was able  to have a bowel movement and his symptoms of nausea and vomiting  resolved.  The patient's Foley catheter was discontinued and he was able  to void without difficulty.  He did have a mildly elevated.  He did have  an elevated WBC to 21.3 on postop day #3.  He also was noted to have  elevated fever.  Urine was negative for urinary tract infection.  Culture with no growth from the urine.  The patient's WBC decreased to  13.5 on postop day #4 and he was afebrile.  Hemoglobin dropped  postoperatively to 8.8 and 26.  He was treated with Trinsicon for  supplementation.  Oral supplement also given for potassium level  dropping to 3.2.  At the time of discharge, he she was able to take a  regular diet.  The patient utilized PCA analgesics initially for his  discomfort and was weaned to oral analgesics utilizing OxyContin and  OxyIR at the time of discharge.  Other pertinent laboratory values on  February 12, 2007, show WBC was 13.5, hemoglobin 8.8 and hematocrit 26.  Chemistry values within normal limits with exception of potassium 3.2 on  August 13, which was supplemented orally.   Urine culture on August 11,  showed final growth of 25,000 colonies of Staphylococcus species  coagulase-negative.  EKG on admission sinus bradycardia, otherwise  normal EKG.   DISPOSITION:  The patient was discharged to his home.   ACTIVITY:  Arrangements made for home health physical therapy.  He was  instructed in no bending, lifting or twisting and to wear his brace at  all times when out of bed.   WOUND CARE:  He will keep his wound covered with a dressing and  will  keep it dry until there is no further drainage.  He will resume a  regular diet.  The patient was instructed to follow up with Dr. Otelia Sergeant 7-  10 days after discharge.   DISCHARGE MEDICATIONS:  Medication reconciliation form was given to the  patient and he will resume all his home medications as taken prior to  admission with the exception of aspirin.  1. OxyContin 20 mg one every 12 hours.  2. Percocet 5/325 one to two every 4-6 hours as needed for pain.  3. Robaxin 500 mg one every 8 hours as needed for spasm.  4. Ambien 5 mg at bedtime.  5. Trinsicon twice daily.   All questions encouraged and answered prior to discharge.   CONDITION ON DISCHARGE:  Stable.      Wende Neighbors, P.A.      Kerrin Champagne, M.D.  Electronically Signed    SMV/MEDQ  D:  05/06/2007  T:  05/07/2007  Job:  [id number]

## 2010-11-18 NOTE — H&P (Signed)
NAME:  Brian Gallegos, Brian Gallegos                      ACCOUNT NO.:  [id number]   MEDICAL RECORD NO.:  [id number]                   PATIENT TYPE:  INP   LOCATION:                                       FACILITY:  [facility]   PHYSICIAN:  Kerrin Champagne, M.D.                DATE OF BIRTH:  1946-03-18   DATE OF ADMISSION:  07/18/2002  DATE OF DISCHARGE:                                HISTORY & PHYSICAL   CHIEF COMPLAINT:  Neck pain and left arm pain.   HISTORY OF PRESENT ILLNESS:  The patient is a 65 year old male with a long  history of neck pain dating back to the year 2000 and also with left arm  pain.  The patient was picking up a limb in his yard back in 9/03 and had  increasing discomfort.  He had pain into his left arm and shoulder and  radiating downwards over the outer aspect of his arm towards his elbow.  He  also has left parascapular discomfort.  He also states that he has  experienced some mild lumbar pain as well, discomfort at present in the left  arm when he sleeps on that side and pain with elevation.   PAST MEDICAL HISTORY:  Hypertension.   PAST SURGICAL HISTORY:  Lumbar disk surgery, knee surgery two times on the  left and one time on the right.   MEDICATIONS:  Cardura 8 mg one p.o. q.d., aspirin 81 mg one p.o. q.d., HCTZ  25 mg one p.o. q.d., Plendil 5 mg one p.o. q.d., and Prozac 20 mg one p.o.  q.d.   ALLERGIES:  Developed itching with IV pain medicines 18 years ago with his  surgery.   SOCIAL HISTORY:  The patient denies any tobacco, he is a social alcohol  drinker, he is married and lives in a one story house, his wife will be  taking care of him after surgery.   FAMILY HISTORY:  Father deceased, myocardial infarction, hypertension,  cancer.  Mother with stroke.   REVIEW OF SYSTEMS:  GENERAL:  Denies fevers, chills, night sweats, bleeding  tendencies.  CNS:  Denies blurry or double vision, fevers, headaches,  paralysis.  RESPIRATORY:  Denies shortness of breath,  productive cough,  hemoptysis.  CV:  Denies chest pain, angina, orthopnea.  GI:  Positive  diarrhea.  Denies nausea, vomiting, constipation, melena or bloody stools.  GU:  Denies dysuria, hematuria, discharge.  MUSCULOSKELETAL:  Per HPI.   PHYSICAL EXAMINATION:  VITAL SIGNS:  Blood pressure 130/90, pulse 100,  respirations 16.  GENERAL:  Well-developed, well-nourished 65 year old male.  HEENT:  Normocephalic, atraumatic.  Pupils equal, round and reactive to  light.  NECK:  Supple, no carotid bruit noted.  CHEST:  Clear to auscultation bilaterally, no wheezes or crackles.  HEART:  Regular rhythm, tachycardic at 100, no murmurs, rubs or gallops.  ABDOMEN:  Soft, nontender, non-distended, positive bowel sounds.  EXTREMITIES:  Biceps 2+ and symmetric, triceps 2+ and symmetric,  brachioradialis 2+ on the right and 1+ on the left.  Positive impingement  sign, left shoulder.  No paresthesias.  Positive Spurling's sign on left  side of the neck, complaining of numbness on the left outer arm.  SKIN:  No rashes or lesions.   X-RAY:  MRI reveals soft disk herniation at C6-7.   IMPRESSION:  1. Herniated nucleus pulposus at C6-7.  2. Hypertension.   PLAN:  The patient will be admitted to [facility] on 07/18/02 and  undergo AC DF, C6-7 and right iliac crest bone graft by Dr. Vira Browns.        Clarene Reamer, P.A.-C.                   Kerrin Champagne, M.D.    SW/MEDQ  D:  07/07/2002  T:  07/07/2002  Job:  [id number]

## 2011-04-17 LAB — CBC
HCT: 26 — ABNORMAL LOW
Hemoglobin: 10.5 — ABNORMAL LOW
Hemoglobin: 8.8 — ABNORMAL LOW
MCHC: 33.7
MCHC: 33.9
MCHC: 33.9
MCHC: 34
MCV: 93.6
MCV: 94.5
Platelets: 211
RBC: 2.79 — ABNORMAL LOW
RBC: 2.99 — ABNORMAL LOW
RBC: 4.56
RDW: 13.6
RDW: 13.8
RDW: 14.4 — ABNORMAL HIGH
WBC: 21.3 — ABNORMAL HIGH
WBC: 21.9 — ABNORMAL HIGH

## 2011-04-17 LAB — BASIC METABOLIC PANEL
CO2: 29
CO2: 32
Calcium: 7.9 — ABNORMAL LOW
Calcium: 9.6
Chloride: 101
Creatinine, Ser: 1.03
GFR calc Af Amer: >60
GFR calc non Af Amer: >60
Glucose, Bld: 115 — ABNORMAL HIGH
Glucose, Bld: 97
Potassium: 3.6
Potassium: 4.3
Sodium: 139
Sodium: 140

## 2011-04-17 LAB — CROSSMATCH
ABO/RH(D): O POS
Antibody Screen: NEGATIVE

## 2011-04-17 LAB — URINALYSIS, ROUTINE W REFLEX MICROSCOPIC
Bilirubin Urine: NEGATIVE
Ketones, ur: 15 — AB
Protein, ur: NEGATIVE
Urobilinogen, UA: 0.2

## 2011-04-17 LAB — URINE CULTURE: Colony Count: 25000

## 2011-04-17 LAB — URINE MICROSCOPIC-ADD ON

## 2011-04-17 LAB — HEMOGLOBIN AND HEMATOCRIT, BLOOD
HCT: 31.3 — ABNORMAL LOW
Hemoglobin: 10.8 — ABNORMAL LOW

## 2014-03-24 DIAGNOSIS — M5440 Lumbago with sciatica, unspecified side: Secondary | ICD-10-CM | POA: Insufficient documentation

## 2014-03-24 DIAGNOSIS — M961 Postlaminectomy syndrome, not elsewhere classified: Secondary | ICD-10-CM | POA: Insufficient documentation

## 2014-04-24 ENCOUNTER — Encounter (HOSPITAL_COMMUNITY): Payer: Self-pay | Admitting: Pharmacy Technician

## 2014-04-27 ENCOUNTER — Other Ambulatory Visit: Payer: Self-pay | Admitting: Anesthesiology

## 2014-04-29 ENCOUNTER — Encounter (HOSPITAL_COMMUNITY): Payer: Self-pay

## 2014-04-29 ENCOUNTER — Encounter (HOSPITAL_COMMUNITY)
Admission: RE | Admit: 2014-04-29 | Discharge: 2014-04-29 | Disposition: A | Discharge status: Home / Self Care | Payer: Medicare Other | Source: Ambulatory Visit | Attending: Anesthesiology | Admitting: Anesthesiology

## 2014-04-29 DIAGNOSIS — Z6834 Body mass index (BMI) 34.0-34.9, adult: Secondary | ICD-10-CM | POA: Insufficient documentation

## 2014-04-29 DIAGNOSIS — Z01818 Encounter for other preprocedural examination: Secondary | ICD-10-CM | POA: Insufficient documentation

## 2014-04-29 DIAGNOSIS — E780 Pure hypercholesterolemia: Secondary | ICD-10-CM | POA: Insufficient documentation

## 2014-04-29 DIAGNOSIS — G2581 Restless legs syndrome: Secondary | ICD-10-CM | POA: Insufficient documentation

## 2014-04-29 DIAGNOSIS — R001 Bradycardia, unspecified: Secondary | ICD-10-CM | POA: Diagnosis not present

## 2014-04-29 DIAGNOSIS — I10 Essential (primary) hypertension: Secondary | ICD-10-CM | POA: Diagnosis not present

## 2014-04-29 DIAGNOSIS — I447 Left bundle-branch block, unspecified: Secondary | ICD-10-CM | POA: Diagnosis not present

## 2014-04-29 DIAGNOSIS — I440 Atrioventricular block, first degree: Secondary | ICD-10-CM | POA: Insufficient documentation

## 2014-04-29 HISTORY — DX: Restless legs syndrome: G25.81

## 2014-04-29 HISTORY — DX: Essential (primary) hypertension: I10

## 2014-04-29 HISTORY — DX: Unspecified cataract: H26.9

## 2014-04-29 HISTORY — DX: Presence of spectacles and contact lenses: Z97.3

## 2014-04-29 HISTORY — DX: Unspecified osteoarthritis, unspecified site: M19.90

## 2014-04-29 LAB — BASIC METABOLIC PANEL
Anion gap: 12 (ref 5–15)
BUN: 10 mg/dL (ref 6–23)
CALCIUM: 9.3 mg/dL (ref 8.4–10.5)
CO2: 27 meq/L (ref 19–32)
CREATININE: 0.92 mg/dL (ref 0.50–1.35)
Chloride: 99 mEq/L (ref 96–112)
GFR calc Af Amer: >90 mL/min (ref >90)
GFR calc non Af Amer: 85 mL/min — ABNORMAL LOW (ref >90)
GLUCOSE: 106 mg/dL — AB (ref 70–99)
Potassium: 4.1 mEq/L (ref 3.7–5.3)
SODIUM: 138 meq/L (ref 137–147)

## 2014-04-29 LAB — SURGICAL PCR SCREEN
MRSA, PCR: NEGATIVE
Staphylococcus aureus: POSITIVE — AB

## 2014-04-29 LAB — CBC
HCT: 38.4 % — ABNORMAL LOW (ref 39.0–52.0)
Hemoglobin: 13 g/dL (ref 13.0–17.0)
MCH: 32.9 pg (ref 26.0–34.0)
MCHC: 33.9 g/dL (ref 30.0–36.0)
MCV: 97.2 fL (ref 78.0–100.0)
Platelets: 210 10*3/uL (ref 150–400)
RBC: 3.95 MIL/uL — ABNORMAL LOW (ref 4.22–5.81)
RDW: 13.1 % (ref 11.5–15.5)
WBC: 6.5 10*3/uL (ref 4.0–10.5)

## 2014-04-29 NOTE — Progress Notes (Signed)
Anesthesia Chart Review:  Pt is [age] year old male scheduled for lumbar spinal cord stimulator implant on 05/08/2014 with Dr. Ollen BowlHarkins.   PMH: HTN, hypercholesterolemia, restless leg syndrome. Never smoker. BMI 34  Medications include: ASA, lisinopril, HCTZ, atenolol, clonidine, cardura, simvastatin, requip, gabapentin, amitripyline, norco  Preoperative labs reviewed.   EKG: sinus bradycardia (59 bpm) with 1st degree AV block Incomplete LBBB Minimal voltage criteria for LVH, may be normal variant  Reviewed case with Dr. Jean RosenthalJackson. Reassess DOS and as long as pt remains symptom-free, I anticipate he can proceed with surgery as scheduled.   Rica Mastngela Aviance [place], FNP-BC [facility]esthesiology Phone: [phone] 04/30/2014 3:42 PM

## 2014-04-29 NOTE — Progress Notes (Signed)
Pt denies SOB, chest pain, and being under the care of a cardiologist. Pt denies having an echo and cardiac cath. Pt denies having a chest x ray and EKG within the last year. Pt PCP is Dr[facility] Sigmund [facility]elLisa Miller [facility]. Pt chart forwarded to [place] ( anesthesia) to review abnormal EKG.

## 2014-04-29 NOTE — Pre-Procedure Instructions (Signed)
Brian GuernseyClifford P Gallegos  04/29/2014   Your procedure is scheduled on: Friday, May 08, 2014  [facility] at 5:30 AM.  Call this number if you have problems the morning of surgery: [phone]   Remember:   Do not eat food or drink liquids after midnight Thursday, May 07, 2014   Take these medicines the morning of surgery with A SIP OF WATER: atenolol (TENORMIN), cloNIDine (CATAPRES), doxazosin (CARDURA), HYDROcodone- (NORCO)  Stop taking Aspirin, vitamins, and herbal medications. Do not take any NSAIDs ie: Ibuprofen, Advil, Naproxen or any medication containing Aspirin; stop 5 days prior to procedure ( Monday, May 04, 2014 )   Do not wear jewelry, make-up or nail polish.  Do not wear lotions, powders, or perfumes. You may not wear deodorant.  Do not shave 48 hours prior to surgery. Men may shave face and neck.  Do not bring valuables to the hospital.  [facility] any belongings or valuables.               Contacts, dentures or bridgework may not be worn into surgery.  Leave suitcase in the car. After surgery it may be brought to your room.  For patients admitted to the hospital, discharge time is determined by your treatment team.               Patients discharged the day of surgery will not be allowed to drive home.  Name and phone number of your driver:   Special Instructions:  Special Instructions:Special Instructions: [facility] - Preparing for Surgery  Before surgery, you can play an important role.  Because skin is not sterile, your skin needs to be as free of germs as possible.  You can reduce the number of germs on you skin by washing with CHG (chlorahexidine gluconate) soap before surgery.  CHG is an antiseptic cleaner which kills germs and bonds with the skin to continue killing germs even after washing.  Please DO NOT use if you have an allergy to CHG or antibacterial soaps.  If your skin becomes reddened/irritated  stop using the CHG and inform your nurse when you arrive at Short Stay.  Do not shave (including legs and underarms) for at least 48 hours prior to the first CHG shower.  You may shave your face.  Please follow these instructions carefully:   1.  Shower with CHG Soap the night before surgery and the morning of Surgery.  2.  If you choose to wash your hair, wash your hair first as usual with your normal shampoo.  3.  After you shampoo, rinse your hair and body thoroughly to remove the Shampoo.  4.  Use CHG as you would any other liquid soap.  You can apply chg directly  to the skin and wash gently with scrungie or a clean washcloth.  5.  Apply the CHG Soap to your body ONLY FROM THE NECK DOWN.  Do not use on open wounds or open sores.  Avoid contact with your eyes, ears, mouth and genitals (private parts).  Wash genitals (private parts) with your normal soap.  6.  Wash thoroughly, paying special attention to the area where your surgery will be performed.  7.  Thoroughly rinse your body with warm water from the neck down.  8.  DO NOT shower/wash with your normal soap after using and rinsing off the CHG Soap.  9.  Pat yourself dry with a clean towel.  10.  Wear clean pajamas.            11.  Place clean sheets on your bed the night of your first shower and do not sleep with pets.  Day of Surgery  Do not apply any lotions/deodorants the morning of surgery.  Please wear clean clothes to the hospital/surgery center.   Please read over the following fact sheets that you were given: Pain Booklet, Coughing and Deep Breathing, MRSA Information and Surgical Site Infection Prevention

## 2014-04-29 NOTE — Progress Notes (Signed)
04/29/14 1123  OBSTRUCTIVE SLEEP APNEA  Have you ever been diagnosed with sleep apnea through a sleep study? No  Do you snore loudly (loud enough to be heard through closed doors)?  0  Do you often feel tired, fatigued, or sleepy during the daytime? 0  Has anyone observed you stop breathing during your sleep? 0  Do you have, or are you being treated for high blood pressure? 1  BMI more than 35 kg/m2? 0  Age over 68 years old? 1  Neck circumference greater than 40 cm/16 inches? 1  Gender: 1  Obstructive Sleep Apnea Score 4

## 2014-05-07 MED ORDER — CEFAZOLIN SODIUM-DEXTROSE 2-3 GM-% IV SOLR
2.0000 g | INTRAVENOUS | Status: AC
  Administered 2014-05-08: 2 g via INTRAVENOUS
  Filled 2014-05-07: qty 50

## 2014-05-07 NOTE — H&P (Signed)
Brian Gallegos is an 68 y.o. male.   Chief Complaint: back pain with radiation into the lower extremities. HPI: patient with previous history of back pain with radicular symptoms, status post lumbar surgical intervention, failed other conservative management.  Under the care of Dr. Laurence Gallegos, the patient underwent a trial of spinal cord stimulator therapy.  He had excellent results including increased functionality, and greater than 50% reduction in his overall pain complex.  He has referred to me for permanent implantation.  I have met the patient, described surgical procedure to him, and it's accordant risks, benefits and alternatives.  He has expressed his interest in proceeding.    Past Medical History  Diagnosis Date  . Hypertension   . Wears glasses   . Early cataracts, bilateral   . H/O hypercholesterolemia   . Restless leg syndrome   . Chronic back pain   . Arthritis   . Headache     Past Surgical History  Procedure Laterality Date  . Back surgery    . Knee arthroscopy      B/L  . Joint replacement      Left knee  . Colonoscopy      Family History  Problem Relation Age of Onset  . Aneurysm Mother   . Heart attack Father   . Hypertension Father   . Cancer Other    Social History:  reports that he has never smoked. He has never used smokeless tobacco. He reports that he drinks alcohol. He reports that he does not use illicit drugs.  Allergies: No Known Allergies  Medications Prior to Admission  Medication Sig Dispense Refill  . amitriptyline (ELAVIL) 25 MG tablet Take 25 mg by mouth at bedtime.    Marland Kitchen aspirin EC 81 MG tablet Take 81 mg by mouth daily.    Marland Kitchen atenolol (TENORMIN) 50 MG tablet Take 50 mg by mouth daily.    . Cholecalciferol 1000 UNITS tablet Take 1,000 Units by mouth daily.    . cloNIDine (CATAPRES) 0.2 MG tablet Take 0.2 mg by mouth 2 (two) times daily.    Marland Kitchen doxazosin (CARDURA) 8 MG tablet Take 8 mg by mouth daily.    Marland Kitchen gabapentin (NEURONTIN) 400  MG capsule Take 400 mg by mouth at bedtime.    . hydrochlorothiazide (HYDRODIURIL) 25 MG tablet Take 25 mg by mouth daily.    Marland Kitchen HYDROcodone-acetaminophen (NORCO) 7.5-325 MG per tablet Take 1 tablet by mouth every 8 (eight) hours.     Marland Kitchen lisinopril (PRINIVIL,ZESTRIL) 40 MG tablet Take 40 mg by mouth daily.    . methocarbamol (ROBAXIN) 750 MG tablet Take 750 mg by mouth 3 (three) times daily.    Marland Kitchen rOPINIRole (REQUIP) 0.5 MG tablet Take 1.5 mg by mouth at bedtime.    . simvastatin (ZOCOR) 80 MG tablet Take 40 mg by mouth every evening.      No results found for this or any previous visit (from the past 48 hour(s)). No results found.  Review of Systems  Constitutional: Negative.   HENT: Negative.   Eyes: Negative.   Respiratory: Negative.   Cardiovascular: Negative.   Gastrointestinal: Negative.   Musculoskeletal: Positive for back pain and joint pain. Negative for myalgias and falls.  Skin: Negative.   Neurological: Negative.   Endo/Heme/Allergies: Negative.   Psychiatric/Behavioral: Negative.     There were no vitals taken for this visit. Physical Exam  Constitutional: He is oriented to person, place, and time. He appears well-developed and well-nourished.  HENT:  Head: Normocephalic and atraumatic.  Eyes: EOM are normal. Pupils are equal, round, and reactive to light.  Neck: Normal range of motion.  Cardiovascular: Normal rate and regular rhythm.   Musculoskeletal: Normal range of motion.  Neurological: He is alert and oriented to person, place, and time.  Skin: Skin is warm and dry.  Psychiatric: He has a normal mood and affect. His behavior is normal. Judgment and thought content normal.     Assessment/Plan 1) chronic pain 2) lumbar post-laminectomy syndrome  PLAN: permanent SCS implant  Brian Gallegos C 05/07/2014, 7:10 AM

## 2014-05-08 ENCOUNTER — Ambulatory Visit (HOSPITAL_COMMUNITY): Payer: Medicare Other

## 2014-05-08 ENCOUNTER — Encounter (HOSPITAL_COMMUNITY)
Admission: RE | Disposition: A | Discharge status: Home / Self Care | Payer: Self-pay | Source: Ambulatory Visit | Attending: Anesthesiology

## 2014-05-08 ENCOUNTER — Ambulatory Visit (HOSPITAL_COMMUNITY): Payer: Medicare Other | Admitting: Certified Registered Nurse Anesthetist

## 2014-05-08 ENCOUNTER — Ambulatory Visit (HOSPITAL_COMMUNITY): Payer: Medicare Other | Admitting: Emergency Medicine

## 2014-05-08 ENCOUNTER — Ambulatory Visit (HOSPITAL_COMMUNITY)
Admission: RE | Admit: 2014-05-08 | Discharge: 2014-05-08 | Disposition: A | Discharge status: Home / Self Care | Payer: Medicare Other | Source: Ambulatory Visit | Attending: Anesthesiology | Admitting: Anesthesiology

## 2014-05-08 ENCOUNTER — Encounter (HOSPITAL_COMMUNITY): Payer: Self-pay | Admitting: Surgery

## 2014-05-08 DIAGNOSIS — G8929 Other chronic pain: Secondary | ICD-10-CM | POA: Diagnosis not present

## 2014-05-08 DIAGNOSIS — M5432 Sciatica, left side: Secondary | ICD-10-CM

## 2014-05-08 DIAGNOSIS — M961 Postlaminectomy syndrome, not elsewhere classified: Secondary | ICD-10-CM | POA: Diagnosis present

## 2014-05-08 DIAGNOSIS — E780 Pure hypercholesterolemia: Secondary | ICD-10-CM | POA: Insufficient documentation

## 2014-05-08 DIAGNOSIS — Y92239 Unspecified place in hospital as the place of occurrence of the external cause: Secondary | ICD-10-CM | POA: Insufficient documentation

## 2014-05-08 DIAGNOSIS — Y838 Other surgical procedures as the cause of abnormal reaction of the patient, or of later complication, without mention of misadventure at the time of the procedure: Secondary | ICD-10-CM | POA: Insufficient documentation

## 2014-05-08 DIAGNOSIS — M5430 Sciatica, unspecified side: Secondary | ICD-10-CM

## 2014-05-08 DIAGNOSIS — Z79899 Other long term (current) drug therapy: Secondary | ICD-10-CM | POA: Diagnosis not present

## 2014-05-08 DIAGNOSIS — I10 Essential (primary) hypertension: Secondary | ICD-10-CM | POA: Diagnosis not present

## 2014-05-08 DIAGNOSIS — Z7982 Long term (current) use of aspirin: Secondary | ICD-10-CM | POA: Diagnosis not present

## 2014-05-08 DIAGNOSIS — M1990 Unspecified osteoarthritis, unspecified site: Secondary | ICD-10-CM | POA: Insufficient documentation

## 2014-05-08 DIAGNOSIS — G2581 Restless legs syndrome: Secondary | ICD-10-CM | POA: Diagnosis not present

## 2014-05-08 DIAGNOSIS — M5416 Radiculopathy, lumbar region: Secondary | ICD-10-CM | POA: Insufficient documentation

## 2014-05-08 HISTORY — PX: SPINAL CORD STIMULATOR INSERTION: SHX5378

## 2014-05-08 SURGERY — INSERTION, SPINAL CORD STIMULATOR, LUMBAR
Anesthesia: Monitor Anesthesia Care | Site: Back

## 2014-05-08 MED ORDER — SODIUM CHLORIDE 0.9 % IR SOLN
Status: DC | PRN
  Administered 2014-05-08: 08:00:00

## 2014-05-08 MED ORDER — 0.9 % SODIUM CHLORIDE (POUR BTL) OPTIME
TOPICAL | Status: DC | PRN
  Administered 2014-05-08: 1000 mL

## 2014-05-08 MED ORDER — FENTANYL CITRATE 0.05 MG/ML IJ SOLN
INTRAMUSCULAR | Status: AC
  Filled 2014-05-08: qty 2

## 2014-05-08 MED ORDER — HYDROCODONE-ACETAMINOPHEN 7.5-325 MG PO TABS: 1.0000 | ORAL_TABLET | ORAL | Status: DC | PRN

## 2014-05-08 MED ORDER — PHENYLEPHRINE HCL 10 MG/ML IJ SOLN
INTRAMUSCULAR | Status: DC | PRN
  Administered 2014-05-08: 80 ug via INTRAVENOUS
  Administered 2014-05-08: 40 ug via INTRAVENOUS
  Administered 2014-05-08: 80 ug via INTRAVENOUS
  Administered 2014-05-08: 40 ug via INTRAVENOUS
  Administered 2014-05-08: 80 ug via INTRAVENOUS
  Administered 2014-05-08: 40 ug via INTRAVENOUS
  Administered 2014-05-08: 80 ug via INTRAVENOUS
  Administered 2014-05-08: 40 ug via INTRAVENOUS
  Administered 2014-05-08: 80 ug via INTRAVENOUS

## 2014-05-08 MED ORDER — LIDOCAINE HCL (CARDIAC) 20 MG/ML IV SOLN
INTRAVENOUS | Status: DC | PRN
  Administered 2014-05-08: 40 mg via INTRAVENOUS

## 2014-05-08 MED ORDER — ONDANSETRON HCL 4 MG/2ML IJ SOLN
INTRAMUSCULAR | Status: DC | PRN
  Administered 2014-05-08: 4 mg via INTRAVENOUS

## 2014-05-08 MED ORDER — CEPHALEXIN 500 MG PO CAPS: 500.0000 mg | ORAL_CAPSULE | Freq: Three times a day (TID) | ORAL | Status: DC

## 2014-05-08 MED ORDER — MEPERIDINE HCL 25 MG/ML IJ SOLN: 6.2500 mg | INTRAMUSCULAR | Status: DC | PRN

## 2014-05-08 MED ORDER — LACTATED RINGERS IV SOLN
INTRAVENOUS | Status: DC | PRN
  Administered 2014-05-08 (×2): via INTRAVENOUS

## 2014-05-08 MED ORDER — MIDAZOLAM HCL 2 MG/2ML IJ SOLN
INTRAMUSCULAR | Status: AC
  Filled 2014-05-08: qty 2

## 2014-05-08 MED ORDER — PROPOFOL INFUSION 10 MG/ML OPTIME
INTRAVENOUS | Status: DC | PRN
  Administered 2014-05-08: 100 ug/kg/min via INTRAVENOUS

## 2014-05-08 MED ORDER — ONDANSETRON HCL 4 MG/2ML IJ SOLN
INTRAMUSCULAR | Status: AC
  Filled 2014-05-08: qty 2

## 2014-05-08 MED ORDER — BUPIVACAINE-EPINEPHRINE (PF) 0.5% -1:200000 IJ SOLN
INTRAMUSCULAR | Status: DC | PRN
  Administered 2014-05-08: 13 mL via PERINEURAL
  Administered 2014-05-08: 10 mL via PERINEURAL

## 2014-05-08 MED ORDER — FENTANYL CITRATE 0.05 MG/ML IJ SOLN
INTRAMUSCULAR | Status: DC | PRN
  Administered 2014-05-08: 50 ug via INTRAVENOUS
  Administered 2014-05-08: 25 ug via INTRAVENOUS
  Administered 2014-05-08 (×2): 50 ug via INTRAVENOUS
  Administered 2014-05-08: 25 ug via INTRAVENOUS

## 2014-05-08 MED ORDER — FENTANYL CITRATE 0.05 MG/ML IJ SOLN
25.0000 ug | INTRAMUSCULAR | Status: DC | PRN
  Administered 2014-05-08 (×3): 50 ug via INTRAVENOUS

## 2014-05-08 MED ORDER — LIDOCAINE HCL (CARDIAC) 20 MG/ML IV SOLN
INTRAVENOUS | Status: AC
  Filled 2014-05-08: qty 5

## 2014-05-08 MED ORDER — MIDAZOLAM HCL 5 MG/5ML IJ SOLN
INTRAMUSCULAR | Status: DC | PRN
  Administered 2014-05-08: 1 mg via INTRAVENOUS

## 2014-05-08 MED ORDER — PHENYLEPHRINE 40 MCG/ML (10ML) SYRINGE FOR IV PUSH (FOR BLOOD PRESSURE SUPPORT)
PREFILLED_SYRINGE | INTRAVENOUS | Status: AC
  Filled 2014-05-08: qty 10

## 2014-05-08 MED ORDER — PROMETHAZINE HCL 25 MG/ML IJ SOLN: 6.2500 mg | INTRAMUSCULAR | Status: DC | PRN

## 2014-05-08 MED ORDER — FENTANYL CITRATE 0.05 MG/ML IJ SOLN
INTRAMUSCULAR | Status: AC
  Filled 2014-05-08: qty 5

## 2014-05-08 SURGICAL SUPPLY — 66 items
ADHESIVE MEDICAL (Stimulator) ×2 IMPLANT
ANCHOR CLICK (Anchor) ×1 IMPLANT
ANCHOR CLIK NEURO F/LEAD 2 (Anchor) ×1 IMPLANT
BAG DECANTER FOR FLEXI CONT (MISCELLANEOUS) ×2 IMPLANT
BENZOIN TINCTURE PRP APPL 2/3 (GAUZE/BANDAGES/DRESSINGS) IMPLANT
BINDER ABDOMINAL 12 ML 46-62 (SOFTGOODS) ×2 IMPLANT
BLADE CLIPPER SURG (BLADE) ×2 IMPLANT
CABLE/EXTENSION OR 1X16 61 (CABLE) ×4 IMPLANT
CHLORAPREP W/TINT 26ML (MISCELLANEOUS) ×2 IMPLANT
CONT SPEC 4OZ CLIKSEAL STRL BL (MISCELLANEOUS) ×2 IMPLANT
DRAPE C-ARM 42X72 X-RAY (DRAPES) ×4 IMPLANT
DRAPE C-ARMOR (DRAPES) ×2 IMPLANT
DRAPE LAPAROTOMY 100X72X124 (DRAPES) ×2 IMPLANT
DRAPE POUCH INSTRU U-SHP 10X18 (DRAPES) ×2 IMPLANT
DRAPE SURG 17X23 STRL (DRAPES) ×2 IMPLANT
DRSG OPSITE POSTOP 3X4 (GAUZE/BANDAGES/DRESSINGS) ×2 IMPLANT
DRSG OPSITE POSTOP 4X6 (GAUZE/BANDAGES/DRESSINGS) ×2 IMPLANT
DRSG TELFA 3X8 NADH (GAUZE/BANDAGES/DRESSINGS) IMPLANT
ELECT REM PT RETURN 9FT ADLT (ELECTROSURGICAL) ×2
ELECTRODE REM PT RTRN 9FT ADLT (ELECTROSURGICAL) ×1 IMPLANT
GAUZE SPONGE 4X4 16PLY XRAY LF (GAUZE/BANDAGES/DRESSINGS) ×2 IMPLANT
GLOVE BIOGEL PI IND STRL 7.0 (GLOVE) ×1 IMPLANT
GLOVE BIOGEL PI IND STRL 7.5 (GLOVE) ×1 IMPLANT
GLOVE BIOGEL PI INDICATOR 7.0 (GLOVE) ×1
GLOVE BIOGEL PI INDICATOR 7.5 (GLOVE) ×1
GLOVE ECLIPSE 7.5 STRL STRAW (GLOVE) ×2 IMPLANT
GLOVE EXAM NITRILE LRG STRL (GLOVE) IMPLANT
GLOVE EXAM NITRILE MD LF STRL (GLOVE) IMPLANT
GLOVE EXAM NITRILE XL STR (GLOVE) IMPLANT
GLOVE EXAM NITRILE XS STR PU (GLOVE) IMPLANT
GLOVE INDICATOR 7.5 STRL GRN (GLOVE) ×4 IMPLANT
GOWN STRL REUS W/ TWL LRG LVL3 (GOWN DISPOSABLE) ×1 IMPLANT
GOWN STRL REUS W/ TWL XL LVL3 (GOWN DISPOSABLE) ×1 IMPLANT
GOWN STRL REUS W/TWL 2XL LVL3 (GOWN DISPOSABLE) IMPLANT
GOWN STRL REUS W/TWL LRG LVL3 (GOWN DISPOSABLE) ×1
GOWN STRL REUS W/TWL XL LVL3 (GOWN DISPOSABLE) ×1
IPG PRECISION SPECTRA (Stimulator) ×2 IMPLANT
KIT BASIN OR (CUSTOM PROCEDURE TRAY) ×2 IMPLANT
KIT CHARGING (KITS) ×1
KIT CHARGING PRECISION NEURO (KITS) ×1 IMPLANT
KIT REMOTE CONTROL PRECISION (KITS) ×2 IMPLANT
KIT ROOM TURNOVER OR (KITS) ×2 IMPLANT
KIT SPLITTER 30CM 2X8 (Stimulator) ×4 IMPLANT
LEAD KIT CONTACT INFINION 16 (Stimulator) ×4 IMPLANT
LIQUID BAND (GAUZE/BANDAGES/DRESSINGS) ×4 IMPLANT
NEEDLE 18GX1X1/2 (RX/OR ONLY) (NEEDLE) IMPLANT
NEEDLE HYPO 25X1 1.5 SAFETY (NEEDLE) ×2 IMPLANT
NS IRRIG 1000ML POUR BTL (IV SOLUTION) ×2 IMPLANT
PACK LAMINECTOMY NEURO (CUSTOM PROCEDURE TRAY) ×2 IMPLANT
PAD ARMBOARD 7.5X6 YLW CONV (MISCELLANEOUS) ×2 IMPLANT
SPONGE LAP 4X18 X RAY DECT (DISPOSABLE) ×2 IMPLANT
SPONGE SURGIFOAM ABS GEL SZ50 (HEMOSTASIS) IMPLANT
STAPLER SKIN PROX WIDE 3.9 (STAPLE) ×2 IMPLANT
STRIP CLOSURE SKIN 1/2X4 (GAUZE/BANDAGES/DRESSINGS) IMPLANT
SUT MNCRL AB 4-0 PS2 18 (SUTURE) ×2 IMPLANT
SUT SILK 0 (SUTURE) ×1
SUT SILK 0 MO-6 18XCR BRD 8 (SUTURE) ×1 IMPLANT
SUT SILK 0 TIES 10X30 (SUTURE) IMPLANT
SUT SILK 2 0 TIES 10X30 (SUTURE) IMPLANT
SUT VIC AB 2-0 CP2 18 (SUTURE) ×6 IMPLANT
SYR EPIDURAL 5ML GLASS (SYRINGE) ×2 IMPLANT
SYRINGE 10CC LL (SYRINGE) IMPLANT
TOWEL OR 17X24 6PK STRL BLUE (TOWEL DISPOSABLE) ×2 IMPLANT
TOWEL OR 17X26 10 PK STRL BLUE (TOWEL DISPOSABLE) ×2 IMPLANT
WATER STERILE IRR 1000ML POUR (IV SOLUTION) ×2 IMPLANT
YANKAUER SUCT BULB TIP NO VENT (SUCTIONS) ×2 IMPLANT

## 2014-05-08 NOTE — Anesthesia Preprocedure Evaluation (Addendum)
Anesthesia Evaluation  Patient identified by MRN, date of birth, ID band Patient awake    Reviewed: Allergy & Precautions, H&P , NPO status , Patient's Chart, lab work & pertinent test results, reviewed documented beta blocker date and time   Airway Mallampati: II  TM Distance: >3 FB Neck ROM: Full    Dental  (+) Dental Advisory Given, Teeth Intact   Pulmonary  breath sounds clear to auscultation        Cardiovascular hypertension, Pt. on medications and Pt. on home beta blockers Rhythm:Regular     Neuro/Psych  Headaches,    GI/Hepatic   Endo/Other  Morbid obesity  Renal/GU      Musculoskeletal  (+) Arthritis -,   Abdominal (+)  Abdomen: soft.    Peds  Hematology   Anesthesia Other Findings   Reproductive/Obstetrics                           Anesthesia Physical Anesthesia Plan  ASA: III  Anesthesia Plan: MAC   Post-op Pain Management:    Induction: Intravenous  Airway Management Planned: Simple Face Mask and Natural Airway  Additional Equipment:   Intra-op Plan:   Post-operative Plan:   Informed Consent: I have reviewed the patients History and Physical, chart, labs and discussed the procedure including the risks, benefits and alternatives for the proposed anesthesia with the patient or authorized representative who has indicated his/her understanding and acceptance.   Dental advisory given  Plan Discussed with: CRNA, Anesthesiologist and Surgeon  Anesthesia Plan Comments:         Anesthesia Quick Evaluation

## 2014-05-08 NOTE — Op Note (Signed)
PREOP DX: 1) lumbago  2) lumbar radiculopathy  3) lumbar post-laminectomy syndrome  4) chronic pain  POSTOP DX: 1) lumbago  2) lumbar radiculopathy  3) lumbar post-laminectomy syndrome  4) chronic pain PROCEDURES PERFORMED:1) intraop fluoro 2) placement of 2 16 contact [employer] Infinion leads 3) placement of Spectra SCS generator  SURGEON:Marylynne Keelin  ASSISTANT: NONE  ANESTHESIA: MAC  EBL: <20cc  DESCRIPTION OF PROCEDURE: After a discussion of risks, benefits and alternatives, informed consent was obtained. The patient was taken to the OR, turned prone onto a Jackson table, all pressure points padded, SCD's placed, and an adequate plane of anesthesia induced. A timeout was taken to verify the correct patient, position, personnel, availability of appropriate equipment, and administration of perioperative antibiotics.  The thoracic and lumbar areas were widely prepped with chloraprep and draped into a sterile field. Fluoroscopy was used to plan a left paramedian incision at the L1-L2 levels, and an incision made with a 10 blade and carried down to the dorsolumbar fascia with the bovie and blunt dissection. Retractors were placed and a 14g [employer] tuohy needle placed into the epidural space at the T12-L1 interspace using biplanar fluoro and loss-of-resistance technique. The needle was aspirated without any return of fluid. A [employer] INFINION lead was introduced and under live AP fluoro advanced until the distal-most contact overlay the inferior aspect T7 vertebral body shadow with the rest of the contacts distributed over the T8and T9 vertebral bodies in a position just right of anatomic midline. A second Infinion lead was placed just left of anatomic midline in the same levels using the same technique.   The patient was awakened and the leads  tested; impedances were good, and the patient reported good coverage with amplitudes in the 3-7 mA range. 0 silk sutures were placed in the fascia adjacent to the needles. The needles and stylets were removed under fluoroscopy with no lead migration noted. Leads were then fixed to the fascia with clik anchors; repeat images were obtained to verify that there had been no lead migration.  The incision was inspected and hemostasis obtained with the bipolar cautery.  Attention was then turned to creation of a subcutaneous pocket. At the right flank, a 3 cm incision was made with a 10 blade and using the bovie and blunt dissection a pocket of size appropriate to place a SCS generator. The pocket was trialed, and found to be of adequate size. The pocket was inspected for hemostasis, which was found to be excellent. Using a reverse seldinger technique, the leads were tunneled to the pocket site, and the leads inserted into the SCS generator. Impedances were checked, and all found to be excellent. The leads were then all fixed into position with a self-torquing wrench. The wiring was all carefully coiled, placed behind the generator and placed in the pocket.  Both incisions were copiously irrigated with bacitracin-containing irrigation. The lumbar incision was closed in 2 deep layers of interrupted 2-0 vicryl and the skin closed with a running 3-0 monocryl subcuticular suture and dermabond. The pocket incision was closed with a deeper layer of 2-0 vicryl interrupted sutures, and the skin closed with a  running 3-0 monocryl subcuticular suture and dermabond. Sterile dressings were applied. Needle, sponge, and instrument counts were correct x2 at the end of the case.  The patient was then carefully awakened from anesthesia, turned supine, an abdominal binder placed, and the patient taken to the recovery room where he underwent complex spinal cord stimulator programming.  COMPLICATIONS: NONE  CONDITION: Stable  throughout the course of the procedure and immediately afterward  DISPOSITION: discharge to home, with antibiotics and pain medicine. Discussed care with the patient and spouse. Followup in clinic will be scheduled in 10-14 days.

## 2014-05-08 NOTE — Anesthesia Postprocedure Evaluation (Signed)
  Anesthesia Post-op Note  Patient: Brian Gallegos  Procedure(s) Performed: Procedure(s): LUMBAR SPINAL CORD STIMULATOR IMPLANT  (N/A)  Patient Location: PACU  Anesthesia Type:General  Level of Consciousness: awake, alert  and oriented  Airway and Oxygen Therapy: Patient Spontanous Breathing and Patient connected to nasal cannula oxygen  Post-op Pain: mild  Post-op Assessment: Post-op Vital signs reviewed, Patient's Cardiovascular Status Stable, Respiratory Function Stable and Patent Airway  Post-op Vital Signs: Reviewed and stable  Last Vitals:  Filed Vitals:   05/08/14 0942  BP: 138/71  Pulse: 64  Temp:   Resp: 16    Complications: No apparent anesthesia complications

## 2014-05-08 NOTE — Transfer of Care (Signed)
Immediate Anesthesia Transfer of Care Note  Patient: Brian GuernseyClifford P Holaway  Procedure(s) Performed: Procedure(s): LUMBAR SPINAL CORD STIMULATOR IMPLANT  (N/A)  Patient Location: PACU  Anesthesia Type:MAC  Level of Consciousness: awake, alert  and oriented  Airway & Oxygen Therapy: Patient Spontanous Breathing and Patient connected to nasal cannula oxygen  Post-op Assessment: Report given to PACU RN, Post -op Vital signs reviewed and stable and Patient moving all extremities X 4  Post vital signs: Reviewed and stable  Complications: No apparent anesthesia complications

## 2014-05-08 NOTE — Anesthesia Procedure Notes (Signed)
Procedure Name: MAC Date/Time: 05/08/2014 7:30 AM Performed by: Elberta LeatherwoodURNER, Wolfe Camarena E Pre-anesthesia Checklist: Patient identified, Emergency Drugs available, Suction available and Patient being monitored Patient Re-evaluated:Patient Re-evaluated prior to inductionOxygen Delivery Method: Simple face mask Preoxygenation: Pre-oxygenation with 100% oxygen Intubation Type: IV induction Placement Confirmation: positive ETCO2 and breath sounds checked- equal and bilateral Dental Injury: Teeth and Oropharynx as per pre-operative assessment

## 2014-05-11 ENCOUNTER — Ambulatory Visit
Admission: RE | Admit: 2014-05-11 | Discharge: 2014-05-11 | Disposition: A | Discharge status: Home / Self Care | Payer: 59 | Source: Ambulatory Visit | Attending: Anesthesiology | Admitting: Anesthesiology

## 2014-05-11 ENCOUNTER — Other Ambulatory Visit: Payer: Self-pay | Admitting: Anesthesiology

## 2014-05-11 DIAGNOSIS — M5442 Lumbago with sciatica, left side: Secondary | ICD-10-CM

## 2014-05-12 ENCOUNTER — Encounter (HOSPITAL_COMMUNITY): Payer: Self-pay | Admitting: Anesthesiology

## 2014-06-02 HISTORY — PX: TOE FUSION: SHX1070

## 2015-03-04 HISTORY — PX: GLAUCOMA SURGERY: SHX656

## 2015-05-03 ENCOUNTER — Emergency Department (HOSPITAL_COMMUNITY): Payer: Medicare PPO

## 2015-05-03 ENCOUNTER — Emergency Department (HOSPITAL_COMMUNITY)
Admission: EM | Admit: 2015-05-03 | Discharge: 2015-05-03 | Discharge status: Against Medical Advice | Payer: Medicare PPO | Attending: Emergency Medicine | Admitting: Emergency Medicine

## 2015-05-03 ENCOUNTER — Encounter (HOSPITAL_COMMUNITY): Payer: Self-pay | Admitting: *Deleted

## 2015-05-03 DIAGNOSIS — F1010 Alcohol abuse, uncomplicated: Secondary | ICD-10-CM | POA: Insufficient documentation

## 2015-05-03 DIAGNOSIS — I10 Essential (primary) hypertension: Secondary | ICD-10-CM | POA: Insufficient documentation

## 2015-05-03 DIAGNOSIS — R079 Chest pain, unspecified: Secondary | ICD-10-CM | POA: Diagnosis present

## 2015-05-03 DIAGNOSIS — M62838 Other muscle spasm: Secondary | ICD-10-CM | POA: Diagnosis not present

## 2015-05-03 NOTE — ED Notes (Addendum)
Pt reports intermittent central chest pain x3 days, yesterday was having spasms in left side of chest. Today pain was worse. Pain 9/10, pt took oxycodone 10mg  at 0930, pain now 6/10. Denies SOB. Denies n/v. Pt has spinal stimulator. Pt drinks ETOH daily, has weaned down to 4 drinks/day.   pts father died of heart attack at age 69.

## 2015-05-03 NOTE — ED Notes (Signed)
Called pt for blood draw, no answer.  Notified nurse

## 2015-05-03 NOTE — ED Notes (Signed)
rn called for pt, even went outside and called for pt, no response.

## 2015-08-06 DIAGNOSIS — T402X5A Adverse effect of other opioids, initial encounter: Secondary | ICD-10-CM | POA: Insufficient documentation

## 2015-10-11 DIAGNOSIS — R9439 Abnormal result of other cardiovascular function study: Secondary | ICD-10-CM | POA: Diagnosis present

## 2015-10-11 DIAGNOSIS — R0789 Other chest pain: Secondary | ICD-10-CM | POA: Diagnosis present

## 2015-10-11 NOTE — H&P (Signed)
OFFICE VISIT NOTES COPIED TO EPIC FOR DOCUMENTATION  . Brian Gallegos 10/07/2015 9:57 AM Location: [facility] Patient #: [phone] DOB: 1946-05-12 Married / Language: [place] / Race: White Male   History of Present Illness Brian Gallegos K. Vyas MD; 10-07-2015 2:06 PM) Patient words: F/U for abnormal nuclear stress test; last office visit 09/17/2015.  The patient is a 70 year old male who presents for a Follow-up for Preoperative evaluation. [name] is 70 years old white male. Patient comes today to discuss about abnormal stress nuclear scans and further management. He has been advised knee replacement surgery for arthritis of right knee.  Patient denies any complaints of chest pain presently. In October 2016, he had the episode of chest pain associated with high blood pressure. He did not have any further cardiac evaluation. Patient denies shortness of breath, orthopnea or PND. No palpitation, dizziness, near-syncope or syncope. No history of swelling on the legs and no claudication.  Patient has hypertension, prediabetes, hypercholesterolemia. He does not smoke.  No history of thyroid problems. No history of TIA or CVA. No history of myocardial infarction.   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  Family History (Brian Gallegos; 10-07-2015 1:24 PM) Mother Deceased. at age 48 from brain aneurysm; no heart attacks or strokes, no cardiovascular conditions Father Deceased. at age 75 from massive heart attack; one previous MI one week before the fatal event; no strokes, no other known cardiovascular  conditions Sister 1 In good health. 2 years older; no cardiovascular conditions  Social History (Brian Gallegos; 2015/10/07 1:24 PM) Current tobacco use Never smoker. Alcohol Use Moderate alcohol use. 2 beers every night Marital status Married. Living Situation Lives with spouse. Number of Children 2.  Past Surgical History (Brian Gallegos; 2015/10/07 1:24 PM) Spinal Fusion - [id number] Spinal Fusion - Lower [id number] Total Knee Replacement - Left10/2010 Spinal Cord Stimulator Implanted11/2015 Foot Surgery12/2015 Cataract Extraction-Left09/2016  Medication History (Brian Gallegos; 10/07/2015 1:29 PM) Vitamin E (400UNIT Capsule, 1 Oral daily) Active. Aspirin (  Tablet DR, 1 Oral daily) Active. Vitamin B12 ( Tablet ER, 1 Oral daily) Active. Vitamin D3 (1000UNIT Tablet, 1 Oral daily) Active. Amitriptyline HCl (  Tablet, 1 Oral at bedtime) Active. CloNIDine HCl (0.2MG  Tablet, 1 Oral two times daily) Active. HydroCHLOROthiazide (  Tablet, 1 Oral daily) Active. Simvastatin (  Tablet, 1/2 Oral daily) Active. Amitiza ( Capsule, 1 Oral two times daily) Active. Atenolol (  Tablet, 1 Oral two times daily) Active. OxyCODONE HCl (  Tablet, 1 Oral every 8 hours as needed for pain) Active. Doxazosin Mesylate (  Tablet, 1 Oral daily) Active. ROPINIRole HCl (0.5MG  Tablet, 2 Oral two times daily as needed) Active. Lisinopril (  Tablet, 1 Oral two times daily) Active. Methocarbamol (  Tablet, 1 Oral three times daily as needed) Active. Gabapentin (  Capsule, 1 Oral daily) Active. Medications Reconciled (Pt brought list)  Diagnostic Studies History Alysia Penna Reader; 10/07/2015 10:00 AM) Nuclear stress test03/25/2017 1. The resting electrocardiogram demonstrated normal sinus rhythm and no resting arrhythmias. Stress EKG is non-diagnostic for ischemia as it a pharmacologic stress using Lexiscan.Stress symptoms included dyspnea. 2. The  LV is dilated both at rest and appears slightly more dilated in stress images. The LV end diastolic volume was 186 mL. The perfusion imaging study demonstrates a medium sized mild ischemia  in the inferior wall. The LV systolic function is moderately depressed at 36% with mild inferior hypokinesis. This is a high risk study, consider further cardiac work-up.    Review of Systems Brian Gallegos(Brian K. Vyas MD; 09/28/2015 2:56 PM)  Note: GENERAL- Not feeling tired or fatigue, No fever, chills. No recent weight change. CARDIO VASCULAR- No chest pain, shortness of breath, orthopnea or PND. No palpitation, dizziness, fainting. Has hypertension & h/o high cholesterol. No swelling on legs. No claudication in legs, No cramps. No h/o DVT PULMONARY- No cough, phlegm, wheezing, not feeling congested in chest. GASTROINTESTINAL- No abdominal pain, nausea, vomiting or diarrhea. No dark tarry stools. Normal appetite. No heartburn. No jaundice. ENDOCRINE- No Thyroid problem, No feeling of excessive heat or cold, No polydipsia or polyuria. Has Pre Diabetes. NEUROLOGICAL- No focal motor or sensory symptoms, Good coordination. No seizures. MUSCULOSKELETAL- No generalized myalgias or muscle weakness. No joint swelling SKIN- No skin rash, No pruritus HEMATOLOGY- No anemia, petechiae, excessive bruising, epistaxis, GI bleed or any abnormal bleeding.   Vitals Brian Gallegos(Brian K. Vyas MD; 09/28/2015 1:39 PM) 09/28/2015 1:25 PM Weight: 232.56 lb Height: 69in Body Surface Area: 2.2 m Body Mass Index: 34.34 kg/m  Pulse: 74 (Regular)  P.OX: 94% (Room air) BP: 152/80 (Sitting, Left Arm, Standard)       Physical Exam Brian Gallegos(Brian K. Vyas MD; 09/28/2015 2:57 PM) The physical exam findings are as follows: Note:GENERAL APPEARANCE- Alert, Oriented. Well built, obese. Pt. wears glasses. HEENT- Unremarkable, fundi were not examined. NECK- No JVD. Carotid pulses are 2+, No bruits audible. No thyromegaly. No  lymphadenopathy. HEART- Palpation- Apex beat is palpable in left 5th intercostal space, inside midclavicular line. No heave or thrills palpable. Auscultation- Normal S1, S2. No gallops. Grade 1/6 ESM is audible in the aortic area. CHEST- Normal shape. Normal percussion. Auscultation- Normal breath sounds, No crepitations. No wheezing. ABDOMEN- Palpation- Soft, Nontender. No hepatosplenomegaly. No masses felt. Auscultation- Normal bowel sounds. No bruits audible. EXTREMITIES- No Clubbing or Cyanosis. No edema on legs or feet. PERIPHERAL PULSES- Both femoral pulses- 2+, No bruits audible. 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 The resting electrocardiogram demonstrated normal sinus rhythm and no resting arrhythmias. Stress EKG is non-diagnostic for ischemia as it a pharmacologic stress using Lexiscan.Stress symptoms included dyspnea. 2. The LV is dilated both at rest and appears slightly more dilated in stress images. The LV end diastolic volume was 186 mL. The perfusion imaging study demonstrates a medium sized mild ischemia in the inferior wall. The LV systolic function is moderately depressed at 36% with mild inferior hypokinesis. This is a high risk study, consider further cardiac work-up. Cardiomyopathy (I42.9)  Echocardiogram 10/07/2015: Left ventricle cavity is borderline dilated at 5.4cm. Moderate concentric hypertrophy of the left ventricle. Doppler evidence of grade I (impaired) diastolic dysfunction. Left ventricle regional wall motion findings: No wall motion abnormalities. Visual EF is 50-55%. Calculated EF 50%. Left atrial cavity is severely dilated measuring 5.2 cm. Right atrial cavity is mildly dilated. Mild calcification of the mitral valve annulus. Mild mitral regurgitation. Trace tricuspid regurgitation. Unable to estimate PA pressure due to  absence/minimal TR signal.  Chest pain (R07.9) Future Plans 10/04/2015: METABOLIC PANEL, BASIC (16109(80048) - one time 10/04/2015: CBC & PLATELETS (AUTO) (60454(85027) - one time 10/04/2015: PT (PROTHROMBIN TIME) ([id number]) - one time  Benign essential hypertension (I10) Current Plans Started AmLODIPine Besylate 2.5MG , 1 (one) Tablet  daily, #30, 30 days starting 09/28/2015, Ref. x4. Hypercholesteremia (E78.00)  Note/Plan:Results of Lexiscan Myoview scans were explained to the patient and his wife. He has high risk findings of inferior ischemia, dilated left ventricle and reduced ejection fraction of 36%. He does have multiple major coronary risk factors as mentioned above in HPI. In view of high risk findings, I have recommended cardiac catheterization prior to knee surgery. We discussed regarding risks, benefits, alternatives to this including continued medical therapy. Patient wants to proceed. Understands <1-2% risk of death, stroke, MI, urgent CABG, bleeding, infection, renal failure but not limited to these. Pt. verbalized understanding, has been scheduled for cardiac catheterization, and possible angioplasty/stent implant by Dr. Jacinto Halim.  His systolic blood pressure is still mildly elevated, I have added amlodipine 2.5 mg daily. I have advised him to continue all the present medications for blood pressure, cholesterol as well as low-dose aspirin. He has been scheduled for echocardiogram for better assessment of left ventricular size and systolic function.  Primary prevention was again discussed with the patient. He was advised ADA, low-salt, low-cholesterol diet and was also advised calorie restriction to lose weight.  I will see him in follow-up after the cardiac catheterization. I will make further recommendations regarding knee surgery after the cardiac catheterization.  CC: Jarrett Soho, PA; Dr. Vira Browns.  Signed electronically by Earl Many, MD (09/28/2015 4:48 PM)

## 2015-10-12 ENCOUNTER — Encounter (HOSPITAL_COMMUNITY): Payer: Self-pay | Admitting: Cardiology

## 2015-10-12 ENCOUNTER — Encounter (HOSPITAL_COMMUNITY)
Admission: RE | Disposition: A | Discharge status: Home / Self Care | Payer: Self-pay | Source: Ambulatory Visit | Attending: Cardiology

## 2015-10-12 ENCOUNTER — Ambulatory Visit (HOSPITAL_COMMUNITY)
Admission: RE | Admit: 2015-10-12 | Discharge: 2015-10-12 | Disposition: A | Discharge status: Home / Self Care | Payer: Medicare Other | Source: Ambulatory Visit | Attending: Cardiology | Admitting: Cardiology

## 2015-10-12 DIAGNOSIS — I25119 Atherosclerotic heart disease of native coronary artery with unspecified angina pectoris: Secondary | ICD-10-CM | POA: Insufficient documentation

## 2015-10-12 DIAGNOSIS — M1990 Unspecified osteoarthritis, unspecified site: Secondary | ICD-10-CM | POA: Diagnosis not present

## 2015-10-12 DIAGNOSIS — Z7982 Long term (current) use of aspirin: Secondary | ICD-10-CM | POA: Diagnosis not present

## 2015-10-12 DIAGNOSIS — Z955 Presence of coronary angioplasty implant and graft: Secondary | ICD-10-CM

## 2015-10-12 DIAGNOSIS — G43909 Migraine, unspecified, not intractable, without status migrainosus: Secondary | ICD-10-CM | POA: Diagnosis not present

## 2015-10-12 DIAGNOSIS — I2584 Coronary atherosclerosis due to calcified coronary lesion: Secondary | ICD-10-CM | POA: Diagnosis not present

## 2015-10-12 DIAGNOSIS — Z9861 Coronary angioplasty status: Secondary | ICD-10-CM

## 2015-10-12 DIAGNOSIS — I429 Cardiomyopathy, unspecified: Secondary | ICD-10-CM | POA: Diagnosis not present

## 2015-10-12 DIAGNOSIS — E7800 Pure hypercholesterolemia, unspecified: Secondary | ICD-10-CM | POA: Diagnosis not present

## 2015-10-12 DIAGNOSIS — R0789 Other chest pain: Secondary | ICD-10-CM | POA: Diagnosis present

## 2015-10-12 DIAGNOSIS — R9439 Abnormal result of other cardiovascular function study: Secondary | ICD-10-CM | POA: Diagnosis present

## 2015-10-12 DIAGNOSIS — G4700 Insomnia, unspecified: Secondary | ICD-10-CM | POA: Insufficient documentation

## 2015-10-12 DIAGNOSIS — I2510 Atherosclerotic heart disease of native coronary artery without angina pectoris: Secondary | ICD-10-CM | POA: Diagnosis present

## 2015-10-12 DIAGNOSIS — Z981 Arthrodesis status: Secondary | ICD-10-CM | POA: Insufficient documentation

## 2015-10-12 DIAGNOSIS — I10 Essential (primary) hypertension: Secondary | ICD-10-CM | POA: Diagnosis not present

## 2015-10-12 DIAGNOSIS — Z8249 Family history of ischemic heart disease and other diseases of the circulatory system: Secondary | ICD-10-CM | POA: Insufficient documentation

## 2015-10-12 DIAGNOSIS — I081 Rheumatic disorders of both mitral and tricuspid valves: Secondary | ICD-10-CM | POA: Insufficient documentation

## 2015-10-12 HISTORY — DX: Family history of other specified conditions: Z84.89

## 2015-10-12 HISTORY — DX: Low back pain, unspecified: M54.50

## 2015-10-12 HISTORY — PX: CARDIAC CATHETERIZATION: SHX172

## 2015-10-12 HISTORY — PX: CORONARY ANGIOPLASTY: SHX604

## 2015-10-12 HISTORY — DX: Other chronic pain: G89.29

## 2015-10-12 HISTORY — DX: Migraine, unspecified, not intractable, without status migrainosus: G43.909

## 2015-10-12 HISTORY — DX: Pure hypercholesterolemia, unspecified: E78.00

## 2015-10-12 HISTORY — DX: Low back pain: M54.5

## 2015-10-12 LAB — POCT ACTIVATED CLOTTING TIME
Activated Clotting Time: 240 seconds
Activated Clotting Time: 255 seconds

## 2015-10-12 SURGERY — LEFT HEART CATH AND CORONARY ANGIOGRAPHY

## 2015-10-12 MED ORDER — ROPINIROLE HCL 0.5 MG PO TABS
1.5000 mg | ORAL_TABLET | Freq: Every day | ORAL | Status: DC
  Filled 2015-10-12: qty 1

## 2015-10-12 MED ORDER — LIDOCAINE HCL (PF) 1 % IJ SOLN
INTRAMUSCULAR | Status: DC | PRN
  Administered 2015-10-12: 4 mL

## 2015-10-12 MED ORDER — HEPARIN SODIUM (PORCINE) 1000 UNIT/ML IJ SOLN
INTRAMUSCULAR | Status: AC
  Filled 2015-10-12: qty 1

## 2015-10-12 MED ORDER — HYDROMORPHONE HCL 1 MG/ML IJ SOLN
INTRAMUSCULAR | Status: AC
  Filled 2015-10-12: qty 1

## 2015-10-12 MED ORDER — SODIUM CHLORIDE 0.9% FLUSH: 3.0000 mL | Freq: Two times a day (BID) | INTRAVENOUS | Status: DC

## 2015-10-12 MED ORDER — LABETALOL HCL 5 MG/ML IV SOLN
15.0000 mg | Freq: Once | INTRAVENOUS | Status: AC
  Administered 2015-10-12: 15 mg via INTRAVENOUS
  Filled 2015-10-12: qty 4

## 2015-10-12 MED ORDER — HEPARIN (PORCINE) IN NACL 2-0.9 UNIT/ML-% IJ SOLN
INTRAMUSCULAR | Status: AC
  Filled 2015-10-12: qty 1000

## 2015-10-12 MED ORDER — ACETAMINOPHEN 325 MG PO TABS: 650.0000 mg | ORAL_TABLET | ORAL | Status: DC | PRN

## 2015-10-12 MED ORDER — TICAGRELOR 90 MG PO TABS
ORAL_TABLET | ORAL | Status: AC
  Filled 2015-10-12: qty 2

## 2015-10-12 MED ORDER — TICAGRELOR 90 MG PO TABS
ORAL_TABLET | ORAL | Status: DC | PRN
  Administered 2015-10-12: 180 mg via ORAL

## 2015-10-12 MED ORDER — SODIUM CHLORIDE 0.9 % WEIGHT BASED INFUSION: 1.0000 mL/kg/h | INTRAVENOUS | Status: DC

## 2015-10-12 MED ORDER — NITROGLYCERIN 1 MG/10 ML FOR IR/CATH LAB
INTRA_ARTERIAL | Status: AC
  Filled 2015-10-12: qty 10

## 2015-10-12 MED ORDER — ASPIRIN EC 81 MG PO TBEC: 81.0000 mg | DELAYED_RELEASE_TABLET | Freq: Every day | ORAL | Status: DC

## 2015-10-12 MED ORDER — HEPARIN SODIUM (PORCINE) 1000 UNIT/ML IJ SOLN
INTRAMUSCULAR | Status: DC | PRN
  Administered 2015-10-12: 3000 [IU] via INTRAVENOUS
  Administered 2015-10-12: 4000 [IU] via INTRAVENOUS
  Administered 2015-10-12: 5000 [IU] via INTRAVENOUS

## 2015-10-12 MED ORDER — MIDAZOLAM HCL 2 MG/2ML IJ SOLN
INTRAMUSCULAR | Status: AC
  Filled 2015-10-12: qty 2

## 2015-10-12 MED ORDER — SODIUM CHLORIDE 0.9% FLUSH: 3.0000 mL | INTRAVENOUS | Status: DC | PRN

## 2015-10-12 MED ORDER — VERAPAMIL HCL 2.5 MG/ML IV SOLN
INTRA_ARTERIAL | Status: DC | PRN
  Administered 2015-10-12: 11:00:00 via INTRA_ARTERIAL

## 2015-10-12 MED ORDER — IOPAMIDOL (ISOVUE-370) INJECTION 76%
INTRAVENOUS | Status: AC
  Filled 2015-10-12: qty 50

## 2015-10-12 MED ORDER — CLONIDINE HCL 0.1 MG PO TABS
0.2000 mg | ORAL_TABLET | Freq: Two times a day (BID) | ORAL | Status: DC
  Administered 2015-10-12: 0.2 mg via ORAL

## 2015-10-12 MED ORDER — SODIUM CHLORIDE 0.9 % WEIGHT BASED INFUSION
3.0000 mL/kg/h | INTRAVENOUS | Status: AC
  Administered 2015-10-12: 3 mL/kg/h via INTRAVENOUS

## 2015-10-12 MED ORDER — IOPAMIDOL (ISOVUE-370) INJECTION 76%
INTRAVENOUS | Status: DC | PRN
  Administered 2015-10-12: 115 mL via INTRA_ARTERIAL

## 2015-10-12 MED ORDER — IOPAMIDOL (ISOVUE-370) INJECTION 76%
INTRAVENOUS | Status: AC
Start: 2015-10-12 — End: 2015-10-12
  Filled 2015-10-12: qty 100

## 2015-10-12 MED ORDER — AMLODIPINE BESYLATE 5 MG PO TABS: 2.5000 mg | ORAL_TABLET | Freq: Every day | ORAL | Status: DC

## 2015-10-12 MED ORDER — SODIUM CHLORIDE 0.9 % WEIGHT BASED INFUSION
3.0000 mL/kg/h | INTRAVENOUS | Status: DC
  Administered 2015-10-12: 3 mL/kg/h via INTRAVENOUS

## 2015-10-12 MED ORDER — HEPARIN (PORCINE) IN NACL 2-0.9 UNIT/ML-% IJ SOLN
INTRAMUSCULAR | Status: DC | PRN
  Administered 2015-10-12: 1000 mL

## 2015-10-12 MED ORDER — SODIUM CHLORIDE 0.9 % IV SOLN: 250.0000 mL | INTRAVENOUS | Status: DC | PRN

## 2015-10-12 MED ORDER — AMITRIPTYLINE HCL 25 MG PO TABS: 25.0000 mg | ORAL_TABLET | Freq: Every day | ORAL | Status: DC

## 2015-10-12 MED ORDER — ATENOLOL 50 MG PO TABS: 50.0000 mg | ORAL_TABLET | Freq: Every day | ORAL | Status: DC

## 2015-10-12 MED ORDER — ONDANSETRON HCL 4 MG/2ML IJ SOLN: 4.0000 mg | Freq: Four times a day (QID) | INTRAMUSCULAR | Status: DC | PRN

## 2015-10-12 MED ORDER — HYDRALAZINE HCL 20 MG/ML IJ SOLN
10.0000 mg | Freq: Once | INTRAMUSCULAR | Status: AC
  Administered 2015-10-12: 10 mg via INTRAVENOUS
  Filled 2015-10-12: qty 1

## 2015-10-12 MED ORDER — VITAMIN D 1000 UNITS PO TABS: 1000.0000 [IU] | ORAL_TABLET | Freq: Every day | ORAL | Status: DC

## 2015-10-12 MED ORDER — GABAPENTIN 400 MG PO CAPS
400.0000 mg | ORAL_CAPSULE | Freq: Every day | ORAL | Status: DC
  Filled 2015-10-12: qty 1

## 2015-10-12 MED ORDER — METHOCARBAMOL 750 MG PO TABS
750.0000 mg | ORAL_TABLET | Freq: Three times a day (TID) | ORAL | Status: DC | PRN
  Administered 2015-10-12: 13:00:00 750 mg via ORAL
  Filled 2015-10-12 (×3): qty 1

## 2015-10-12 MED ORDER — TICAGRELOR 90 MG PO TABS: 90.0000 mg | ORAL_TABLET | Freq: Two times a day (BID) | ORAL | Status: DC

## 2015-10-12 MED ORDER — CLONIDINE HCL 0.1 MG PO TABS
0.2000 mg | ORAL_TABLET | Freq: Once | ORAL | Status: AC
  Filled 2015-10-12: qty 2

## 2015-10-12 MED ORDER — DOXAZOSIN MESYLATE 8 MG PO TABS: 8.0000 mg | ORAL_TABLET | Freq: Every day | ORAL | Status: DC

## 2015-10-12 MED ORDER — ATORVASTATIN CALCIUM 40 MG PO TABS
40.0000 mg | ORAL_TABLET | Freq: Every day | ORAL | Status: DC
  Administered 2015-10-12: 40 mg via ORAL
  Filled 2015-10-12: qty 1

## 2015-10-12 MED ORDER — HYDROMORPHONE HCL 1 MG/ML IJ SOLN
INTRAMUSCULAR | Status: DC | PRN
  Administered 2015-10-12 (×4): 0.5 mg via INTRAVENOUS

## 2015-10-12 MED ORDER — HYDRALAZINE HCL 20 MG/ML IJ SOLN
10.0000 mg | Freq: Once | INTRAMUSCULAR | Status: AC
  Administered 2015-10-12: 18:00:00 10 mg via INTRAVENOUS
  Filled 2015-10-12: qty 1

## 2015-10-12 MED ORDER — LISINOPRIL 40 MG PO TABS: 40.0000 mg | ORAL_TABLET | Freq: Every day | ORAL | Status: DC

## 2015-10-12 MED ORDER — MIDAZOLAM HCL 2 MG/2ML IJ SOLN
INTRAMUSCULAR | Status: DC | PRN
  Administered 2015-10-12: 1 mg via INTRAVENOUS
  Administered 2015-10-12: 2 mg via INTRAVENOUS
  Administered 2015-10-12: 1 mg via INTRAVENOUS

## 2015-10-12 MED ORDER — HYDROMORPHONE HCL 1 MG/ML IJ SOLN
1.0000 mg | INTRAMUSCULAR | Status: DC | PRN
  Administered 2015-10-12 (×2): 1 mg via INTRAVENOUS
  Filled 2015-10-12 (×2): qty 1

## 2015-10-12 MED ORDER — HYDROCHLOROTHIAZIDE 25 MG PO TABS: 25.0000 mg | ORAL_TABLET | Freq: Every day | ORAL | Status: DC

## 2015-10-12 MED ORDER — VERAPAMIL HCL 2.5 MG/ML IV SOLN
INTRAVENOUS | Status: AC
  Filled 2015-10-12: qty 2

## 2015-10-12 MED ORDER — TICAGRELOR 90 MG PO TABS
90.0000 mg | ORAL_TABLET | Freq: Two times a day (BID) | ORAL | Status: DC
  Filled 2015-10-12: qty 1

## 2015-10-12 MED ORDER — LIDOCAINE HCL (PF) 1 % IJ SOLN
INTRAMUSCULAR | Status: AC
  Filled 2015-10-12: qty 30

## 2015-10-12 MED ORDER — ASPIRIN 81 MG PO CHEW: 81.0000 mg | CHEWABLE_TABLET | ORAL | Status: DC

## 2015-10-12 SURGICAL SUPPLY — 15 items
BALLN ~~LOC~~ EUPHORA RX 4.5X12 (BALLOONS) ×4
BALLOON ~~LOC~~ EUPHORA RX 4.5X12 (BALLOONS) ×2 IMPLANT
CATH OPTITORQUE TIG 4.5 5F (CATHETERS) ×4 IMPLANT
DEVICE RAD COMP TR BAND LRG (VASCULAR PRODUCTS) ×4 IMPLANT
GLIDESHEATH SLEND A-KIT 6F 20G (SHEATH) ×4 IMPLANT
GUIDE CATH RUNWAY 6FR FR4 (CATHETERS) ×4 IMPLANT
KIT ENCORE 26 ADVANTAGE (KITS) ×4 IMPLANT
KIT HEART LEFT (KITS) ×4 IMPLANT
PACK CARDIAC CATHETERIZATION (CUSTOM PROCEDURE TRAY) ×4 IMPLANT
STENT RESOLUTE INTEG 4.0X18 (Permanent Stent) ×4 IMPLANT
STOPCOCK MORSE 400PSI 3WAY (MISCELLANEOUS) ×4 IMPLANT
TRANSDUCER W/STOPCOCK (MISCELLANEOUS) ×4 IMPLANT
TUBING CIL FLEX 10 FLL-RA (TUBING) ×8 IMPLANT
WIRE COUGAR XT STRL 190CM (WIRE) ×4 IMPLANT
WIRE SAFE-T 1.5MM-J .035X260CM (WIRE) ×4 IMPLANT

## 2015-10-12 NOTE — Interval H&P Note (Signed)
History and Physical Interval Note:  10/12/2015 10:14 AM  Brian Gallegos  has presented today for surgery, with the diagnosis of abnormal stress  The various methods of treatment have been discussed with the patient and family. After consideration of risks, benefits and other options for treatment, the patient has consented to  Procedure(s): Left Heart Cath and Coronary Angiography (N/A) and possible PCI  as a surgical intervention .  The patient's history has been reviewed, patient examined, no change in status, stable for surgery.  I have reviewed the patient's chart and labs.  Questions were answered to the patient's satisfaction.    Ischemic Symptoms? CCS I (Ordinary physical activity does not cause anginal symptoms) Anti-ischemic Medical Therapy? Maximal Medical Therapy (2 or more classes of medications) Non-invasive Test Results? High-risk stress test findings: cardiac mortality >3%/yr Prior CABG? No Previous CABG   Patient Information:   1-2V CAD, no prox LAD  A (8)  Indication: 19; Score: 8   Patient Information:   CTO of 1 vessel, no other CAD  A (7)  Indication: 29; Score: 7   Patient Information:   1V CAD with prox LAD  A (9)  Indication: 35; Score: 9   Patient Information:   2V-CAD with prox LAD  A (9)  Indication: 41; Score: 9   Patient Information:   3V-CAD without LMCA  A (9)  Indication: 47; Score: 9   Patient Information:   3V-CAD without LMCA With Abnormal LV systolic function  A (9)  Indication: 48; Score: 9   Patient Information:   LMCA-CAD  A (9)  Indication: 49; Score: 9   Patient Information:   2V-CAD with prox LAD PCI  A (7)  Indication: 62; Score: 7   Patient Information:   2V-CAD with prox LAD CABG  A (8)  Indication: 62; Score: 8   Patient Information:   3V-CAD without LMCA With Low CAD burden(i.e., 3 focal stenoses, low SYNTAX score) PCI  A (7)  Indication: 63; Score: 7   Patient Information:    3V-CAD without LMCA With Low CAD burden(i.e., 3 focal stenoses, low SYNTAX score) CABG  A (9)  Indication: 63; Score: 9   Patient Information:   3V-CAD without LMCA E06c - Intermediate-high CAD burden (i.e., multiple diffuse lesions, presence of CTO, or high SYNTAX score) PCI  U (4)  Indication: 64; Score: 4   Patient Information:   3V-CAD without LMCA E06c - Intermediate-high CAD burden (i.e., multiple diffuse lesions, presence of CTO, or high SYNTAX score) CABG  A (9)  Indication: 64; Score: 9   Patient Information:   LMCA-CAD With Isolated LMCA stenosis  PCI  U (6)  Indication: 65; Score: 6   Patient Information:   LMCA-CAD With Isolated LMCA stenosis  CABG  A (9)  Indication: 65; Score: 9   Patient Information:   LMCA-CAD Additional CAD, low CAD burden (i.e., 1- to 2-vessel additional involvement, low SYNTAX score) PCI  U (5)  Indication: 66; Score: 5   Patient Information:   LMCA-CAD Additional CAD, low CAD burden (i.e., 1- to 2-vessel additional involvement, low SYNTAX score) CABG  A (9)  Indication: 66; Score: 9   Patient Information:   LMCA-CAD Additional CAD, intermediate-high CAD burden (i.e., 3-vessel involvement, presence of CTO, or high SYNTAX score) PCI  I (3)  Indication: 67; Score: 3   Patient Information:   LMCA-CAD Additional CAD, intermediate-high CAD burden (i.e., 3-vessel involvement, presence of CTO, or high SYNTAX score) CABG  A (9)  Indication: 67; Score: 9  Sussan Meter

## 2015-10-12 NOTE — Progress Notes (Signed)
CARDIAC REHAB PHASE I   PRE:  Rate/Rhythm: 69 SR    BP: sitting 195/95    SaO2: 98 RA  MODE:  Ambulation: 260 ft   POST:  Rate/Rhythm: 84 SR    BP: sitting 222/86     SaO2: 100 RA  Tolerated well except for back pain. BP elevated. Return to bed. Ed completed with pt and wife. Voiced understanding. He needs diet work and more ex. Understands importance of Brilinta/ASA. Will send referral to [place] [facility]. [phone]   Brian MassonRandi Kristan [name] CES, ACSM 10/12/2015 2:18 PM

## 2015-10-12 NOTE — Care Management Note (Signed)
Case Management Note  Patient Details  Name: Brian Gallegos MRN: [id number] Date of Birth: 04/03/1946  Subjective/Objective:  Patient is from home with spouse, pta indep,  Has Brilinta 30 day savings card co pay is $6.54, [facility] filled 10 for him and they will get the rest tomorrow.  Patient has no other needs.                   Action/Plan:   Expected Discharge Date:                  Expected Discharge Plan:  Home/Self Care  In-House Referral:     Discharge planning Services  CM Consult  Post Acute Care Choice:    Choice offered to:     DME Arranged:    DME Agency:     HH Arranged:    HH Agency:     Status of Service:  Completed, signed off  Medicare Important Message Given:    Date Medicare IM Given:    Medicare IM give by:    Date Additional Medicare IM Given:    Additional Medicare Important Message give by:     If discussed at Long Length of Stay Meetings, dates discussed:    Additional Comments:  Leone Havenaylor, Francile Woolford Clinton, RN 10/12/2015, 1:25 PM

## 2015-10-12 NOTE — Progress Notes (Signed)
Pt's BP elevated.  Dr. Jacinto HalimGanji paged.  Awaiting return call.

## 2015-10-12 NOTE — Discharge Instructions (Addendum)
Aspirin and Your Heart  Aspirin is a medicine that affects the way blood clots. Aspirin can be used to help reduce the risk of blood clots, heart attacks, and other heart-related problems.  SHOULD I TAKE ASPIRIN? Your health care provider will help you determine whether it is safe and beneficial for you to take aspirin daily. Taking aspirin daily may be beneficial if you:  Have had a heart attack or chest pain.  Have undergone open heart surgery such as coronary artery bypass surgery (CABG).  Have had coronary angioplasty.  Have experienced a stroke or transient ischemic attack (TIA).  Have peripheral vascular disease (PVD).  Have chronic heart rhythm problems such as atrial fibrillation. ARE THERE ANY RISKS OF TAKING ASPIRIN DAILY? Daily use of aspirin can increase your risk of side effects. Some of these include:  Bleeding. Bleeding problems can be minor or serious. An example of a minor problem is a cut that does not stop bleeding. An example of a more serious problem is stomach bleeding or bleeding into the brain. Your risk of bleeding is increased if you are also taking non-steroidal anti-inflammatory medicine (NSAIDs).  Increased bruising.  Upset stomach.  An allergic reaction. People who have nasal polyps have an increased risk of developing an aspirin allergy. WHAT ARE SOME GUIDELINES I SHOULD FOLLOW WHEN TAKING ASPIRIN?   Take aspirin only as directed by your health care provider. Make sure you understand how much you should take and what form you should take. The two forms of aspirin are:  Non-enteric-coated. This type of aspirin does not have a coating and is absorbed quickly. Non-enteric-coated aspirin is usually recommended for people with chest pain. This type of aspirin also comes in a chewable form.  Enteric-coated. This type of aspirin has a special coating that releases the medicine very slowly. Enteric-coated aspirin causes less stomach upset than non-enteric-coated  aspirin. This type of aspirin should not be chewed or crushed.  Drink alcohol in moderation. Drinking alcohol increases your risk of bleeding. WHEN SHOULD I SEEK MEDICAL CARE?   You have unusual bleeding or bruising.  You have stomach pain.  You have an allergic reaction. Symptoms of an allergic reaction include:  Hives.  Itchy skin.  Swelling of the lips, tongue, or face.  You have ringing in your ears. WHEN SHOULD I SEEK IMMEDIATE MEDICAL CARE?   Your bowel movements are bloody, dark red, or black in color.  You vomit or cough up blood.  You have blood in your urine.  You cough, wheeze, or feel short of breath. If you have any of the following symptoms, this is an emergency. Do not wait to see if the pain will go away. Get medical help at once. Call your local emergency services (911 in the [place] Do not drive yourself to the hospital.  You have severe chest pain, especially if the pain is crushing or pressure-like and spreads to the arms, back, neck, or jaw.  You have stroke-like symptoms, such as:   Loss of vision.   Difficulty talking.   Numbness or weakness on one side of your body.   Numbness or weakness in your arm or leg.   Not thinking clearly or feeling confused.    This information is not intended to replace advice given to you by your health care provider. Make sure you discuss any questions you have with your health care provider.   Document Released: 06/01/2008 Document Revised: 07/10/2014 Document Reviewed: 09/24/2013 Elsevier Interactive Patient Education 2016 [employer]  Coronary Angioplasty, Care After Refer to this sheet in the next few weeks. These instructions provide you with information about caring for yourself after your procedure. Your health care provider may also give you more specific instructions. Your treatment has been planned according to current medical practices, but problems sometimes occur. Call your health care  provider if you have any problems or questions after your procedure. WHAT TO EXPECT AFTER THE PROCEDURE After your procedure, it is typical to have the following:  Bruising at the catheter site that usually fades within 1-2 weeks.  Blood collecting in the tissue (hematoma) that may be painful to the touch. It should usually decrease in size and tenderness within 1-2 weeks. HOME CARE INSTRUCTIONS  Take medicines only as directed by your health care provider. Blood thinners may be prescribed after your procedure to improve blood flow.  You may shower 24-48 hours after the procedure or as directed by your health care provider. Remove the bandage (dressing) and gently wash the site with plain soap and water. Pat the area dry with a clean towel. Do not rub the site, because this may cause bleeding.  Do not take baths, swim, or use a hot tub until your health care provider approves.  Check your insertion site every day for redness, swelling, or drainage.  Do not apply powder or lotion to the site.  Do not lift over 10 lb (4.5 kg) for 5 days after your procedure or as directed by your health care provider.  Ask your health care provider when it is okay to:  Return to work or school.  Resume usual physical activities or sports.  Resume sexual activity.  Eat a heart-healthy diet. This should include plenty of fresh fruits and vegetables. Meat should be lean cuts. Avoid the following types of food:  Food that is high in salt.  Canned or highly processed food.  Food that is high in saturated fat or sugar.  Fried food.  Make any other lifestyle changes as recommended by your health care provider. This may include:  Not using any tobacco products, including cigarettes, chewing tobacco, or electronic cigarettes. If you need help quitting, ask your health care provider.  Managing your weight.  Getting regular exercise.  Managing your blood pressure.  Limiting your alcohol  intake.  Managing other health problems, such as diabetes.  Keep all follow-up visits as directed by your health care provider. This is important. SEEK MEDICAL CARE IF:  You have a fever.  You have chills.  You have increased bleeding from the catheter insertion site. Hold pressure on the site. SEEK IMMEDIATE MEDICAL CARE IF:  You develop chest pain or shortness of breath, feel faint, or pass out.  You have unusual pain at the catheter insertion site.  You have redness, warmth, or swelling at the catheter insertion site.  You have drainage (other than a small amount of blood on the dressing) from the catheter insertion site.  The catheter insertion site is bleeding, and the bleeding does not stop after 30 minutes of holding steady pressure on the site.  You develop bleeding from any other place, such as from the rectum. There may be bright red blood in your urine or stool, or it may appear as black, tarry stool.   This information is not intended to replace advice given to you by your health care provider. Make sure you discuss any questions you have with your health care provider.   Document Released: 01/05/2005 Document Revised:  07/10/2014 Document Reviewed: 02/24/2013 Elsevier Interactive Patient Education 2016 [employer].  Exercise Stress Electrocardiogram An exercise stress electrocardiogram is a test to check how blood flows to your heart. It is done to find areas of poor blood flow. You will need to walk on a treadmill for this test. The electrocardiogram will record your heartbeat when you are at rest and when you are exercising. BEFORE THE PROCEDURE  Do not have drinks with caffeine or foods with caffeine for 24 hours before the test, or as told by your doctor. This includes coffee, tea (even decaf tea), sodas, chocolate, and cocoa.  Follow your doctor's instructions about eating and drinking before the test.  Ask your doctor what medicines you should or should not  take before the test. Take your medicines with water unless told by your doctor not to.  If you use an inhaler, bring it with you to the test.  Bring a snack to eat after the test.  Do not  smoke for 4 hours before the test.  Do not put lotions, powders, creams, or oils on your chest before the test.  Wear comfortable shoes and clothing. PROCEDURE  You will have patches put on your chest. Small areas of your chest may need to be shaved. Wires will be connected to the patches.  Your heart rate will be watched while you are resting and while you are exercising.  You will walk on the treadmill. The treadmill will slowly get faster to raise your heart rate.  The test will take about 1-2 hours. AFTER THE PROCEDURE  Your heart rate and blood pressure will be watched after the test.  You may return to your normal diet, activities, and medicines or as told by your doctor.   This information is not intended to replace advice given to you by your health care provider. Make sure you discuss any questions you have with your health care provider.   Document Released: 12/06/2007 Document Revised: 07/10/2014 Document Reviewed: 02/24/2013 Elsevier Interactive Patient Education 2016 [employer]  Coronary Angiogram With Stent, Care After Refer to this sheet in the next few weeks. These instructions provide you with information about caring for yourself after your procedure. Your health care provider may also give you more specific instructions. Your treatment has been planned according to current medical practices, but problems sometimes occur. Call your health care provider if you have any problems or questions after your procedure. WHAT TO EXPECT AFTER THE PROCEDURE  After your procedure, it is typical to have the following:  Bruising at the catheter insertion site that usually fades within 1-2 weeks.  Blood collecting in the tissue (hematoma) that may be painful to the touch. It should  usually decrease in size and tenderness within 1-2 weeks. HOME CARE INSTRUCTIONS  Take medicines only as directed by your health care provider. Blood thinners may be prescribed after your procedure to improve blood flow through the stent.  You may shower 24-48 hours after the procedure or as directed by your health care provider. Remove the bandage (dressing) and gently wash the catheter insertion site with plain soap and water. Pat the area dry with a clean towel. Do not rub the site, because this may cause bleeding.  Do not take baths, swim, or use a hot tub until your health care provider approves.  Check your catheter insertion site every day for redness, swelling, or drainage.  Do not apply powder or lotion to the site.  Do not lift over 10  lb (4.5 kg) for 5 days after your procedure or as directed by your health care provider.  Ask your health care provider when it is okay to:  Return to work or school.  Resume usual physical activities or sports.  Resume sexual activity.  Eat a heart-healthy diet. This should include plenty of fresh fruits and vegetables. Meat should be lean cuts. Avoid the following types of food:  Food that is high in salt.  Canned or highly processed food.  Food that is high in saturated fat or sugar.  Fried food.  Make any other lifestyle changes as recommended by your health care provider. These may include:  Not using any tobacco products, including cigarettes, chewing tobacco, or electronic cigarettes.If you need help quitting, ask your health care provider.  Managing your weight.  Getting regular exercise.  Managing your blood pressure.  Limiting your alcohol intake.  Managing other health problems, such as diabetes.  If you need an MRI after your heart stent has been placed, be sure to tell the health care provider who orders the MRI that you have a heart stent.  Keep all follow-up visits as directed by your health care provider. This  is important. SEEK MEDICAL CARE IF:  You have a fever.  You have chills.  You have increased bleeding from the catheter insertion site. Hold pressure on the site. SEEK IMMEDIATE MEDICAL CARE IF:  You develop chest pain or shortness of breath, feel faint, or pass out.  You have unusual pain at the catheter insertion site.  You have redness, warmth, or swelling at the catheter insertion site.  You have drainage (other than a small amount of blood on the dressing) from the catheter insertion site.  The catheter insertion site is bleeding, and the bleeding does not stop after 30 minutes of holding steady pressure on the site.  You develop bleeding from any other place, such as from your rectum. There may be bright red blood in your urine or stool, or it may appear as black, tarry stool.   This information is not intended to replace advice given to you by your health care provider. Make sure you discuss any questions you have with your health care provider.   Document Released: 01/06/2005 Document Revised: 07/10/2014 Document Reviewed: 11/11/2012 Elsevier Interactive Patient Education 2016 [employer].  Exercise Stress Electrocardiogram An exercise stress electrocardiogram is a test that is done to evaluate the blood supply to your heart. This test may also be called exercise stress electrocardiography. The test is done while you are walking on a treadmill. The goal of this test is to raise your heart rate. This test is done to find areas of poor blood flow to the heart by determining the extent of coronary artery disease (CAD).   CAD is defined as narrowing in one or more heart (coronary) arteries of more than 70%. If you have an abnormal test result, this may mean that you are not getting adequate blood flow to your heart during exercise. Additional testing may be needed to understand why your test was abnormal. LET [facility] CARE PROVIDER KNOW ABOUT:   Any allergies you have.  All  medicines you are taking, including vitamins, herbs, eye drops, creams, and over-the-counter medicines.  Previous problems you or members of your family have had with the use of anesthetics.  Any blood disorders you have.  Previous surgeries you have had.  Medical conditions you have.  Possibility of pregnancy, if this applies. RISKS AND COMPLICATIONS  Generally, this is a safe procedure. However, as with any procedure, complications can occur. Possible complications can include:  Pain or pressure in the following areas:  Chest.  Jaw or neck.  Between your shoulder blades.  Radiating down your left arm.  Dizziness or light-headedness.  Shortness of breath.  Increased or irregular heartbeats.  Nausea or vomiting.  Heart attack (rare). BEFORE THE PROCEDURE  Avoid all forms of caffeine 24 hours before your test or as directed by your health care provider. This includes coffee, tea (even decaffeinated tea), caffeinated sodas, chocolate, cocoa, and certain pain medicines.  Follow your health care provider's instructions regarding eating and drinking before the test.  Take your medicines as directed at regular times with water unless instructed otherwise. Exceptions may include:  If you have diabetes, ask how you are to take your insulin or pills. It is common to adjust insulin dosing the morning of the test.  If you are taking beta-blocker medicines, it is important to talk to your health care provider about these medicines well before the date of your test. Taking beta-blocker medicines may interfere with the test. In some cases, these medicines need to be changed or stopped 24 hours or more before the test.  If you wear a nitroglycerin patch, it may need to be removed prior to the test. Ask your health care provider if the patch should be removed before the test.  If you use an inhaler for any breathing condition, bring it with you to the test.  If you are an outpatient,  bring a snack so you can eat right after the stress phase of the test.  Do not smoke for 4 hours prior to the test or as directed by your health care provider.  Do not apply lotions, powders, creams, or oils on your chest prior to the test.  Wear loose-fitting clothes and comfortable shoes for the test. This test involves walking on a treadmill. PROCEDURE  Multiple patches (electrodes) will be put on your chest. If needed, small areas of your chest may have to be shaved to get better contact with the electrodes. Once the electrodes are attached to your body, multiple wires will be attached to the electrodes and your heart rate will be monitored.  Your heart will be monitored both at rest and while exercising.  You will walk on a treadmill. The treadmill will be started at a slow pace. The treadmill speed and incline will gradually be increased to raise your heart rate. AFTER THE PROCEDURE  Your heart rate and blood pressure will be monitored after the test.  You may return to your normal schedule including diet, activities, and medicines, unless your health care provider tells you otherwise.   This information is not intended to replace advice given to you by your health care provider. Make sure you discuss any questions you have with your health care provider.   Document Released: 06/16/2000 Document Revised: 06/24/2013 Document Reviewed: 02/24/2013 Elsevier Interactive Patient Education 2016 [employer].  Pharmacologic Stress Echocardiogram A pharmacologic stress echocardiogram is a heart (cardiac) test used to look at how well your heart muscle and valves are working. For this test, a medicine is used to increase your heart rate and blood pressure. This medicine is used when you are not able to exercise on a treadmill. It makes your heart work in a way similar to exercise. An echocardiogram uses sound waves (ultrasound) to make an image of your heart.  BEFORE THE PROCEDURE  Do  not  drink or eat foods with caffeine for 24 hours before the test.  Follow your doctor's instructions about eating and drinking before the test.  Ask your doctor what medicines you should or should not take before the test. Take your medicines with water unless told by your doctor not to.  If you use an inhaler, bring it with you to the test.  Bring a snack to eat after the stress part of your test.  Do not  smoke for 4 hours before the test.  Wear comfortable shoes and clothing. PROCEDURE  You will be hooked up to a monitor to watch your heart rate.  A tube (IV) will be put in your hand or arm. The medicine will be given through the tube.  The test will be done before the medicine is given while you are at rest. It will be done again after the medicine is given.  A clear gel is placed on your chest.  A wand-like device is moved over the gel on your chest.  Pictures are then taken using sound waves that come from the wand.  The medicine will cause your heart to beat as if you were exercising. AFTER THE PROCEDURE  Your heart rate and blood pressure will be watched.  You may return to your normal diet, activities, and medicines as told by your doctor.   This information is not intended to replace advice given to you by your health care provider. Make sure you discuss any questions you have with your health care provider.   Document Released: 04/16/2009 Document Revised: 06/24/2013 Document Reviewed: 02/24/2013 Elsevier Interactive Patient Education 2016 Elsevier Inc.  Pharmacologic Stress Electrocardiogram A pharmacologic stress electrocardiogram is a heart (cardiac) test that uses nuclear imaging to evaluate the blood supply to your heart. This test may also be called a pharmacologic stress electrocardiography. Pharmacologic means that a medicine is used to increase your heart rate and blood pressure.  This stress test is done to find areas of poor blood flow to the heart by  determining the extent of coronary artery disease (CAD). Some people exercise on a treadmill, which naturally increases the blood flow to the heart. For those people unable to exercise on a treadmill, a medicine is used. This medicine stimulates your heart and will cause your heart to beat harder and more quickly, as if you were exercising.  Pharmacologic stress tests can help determine:  The adequacy of blood flow to your heart during increased levels of activity in order to clear you for discharge home.  The extent of coronary artery blockage caused by CAD.  Your prognosis if you have suffered a heart attack.  The effectiveness of cardiac procedures done, such as an angioplasty, which can increase the circulation in your coronary arteries.  Causes of chest pain or pressure. [facility] KNOW ABOUT:  Any allergies you have.  All medicines you are taking, including vitamins, herbs, eye drops, creams, and over-the-counter medicines.  Previous problems you or members of your family have had with the use of anesthetics.  Any blood disorders you have.  Previous surgeries you have had.  Medical conditions you have.  Possibility of pregnancy, if this applies.  If you are currently breastfeeding. RISKS AND COMPLICATIONS Generally, this is a safe procedure. However, as with any procedure, complications can occur. Possible complications include:  You develop pain or pressure in the following areas:  Chest.  Jaw or neck.  Between your shoulder blades.  Radiating  down your left arm.  Headache.  Dizziness or light-headedness.  Shortness of breath.  Increased or irregular heartbeat.  Low blood pressure.  Nausea or vomiting.  Flushing.  Redness going up the arm and slight pain during injection of medicine.  Heart attack (rare). BEFORE THE PROCEDURE   Avoid all forms of caffeine for 24 hours before your test or as directed by your health care provider.  This includes coffee, tea (even decaffeinated tea), caffeinated sodas, chocolate, cocoa, and certain pain medicines.  Follow your health care provider's instructions regarding eating and drinking before the test.  Take your medicines as directed at regular times with water unless instructed otherwise. Exceptions may include:  If you have diabetes, ask how you are to take your insulin or pills. It is common to adjust insulin dosing the morning of the test.  If you are taking beta-blocker medicines, it is important to talk to your health care provider about these medicines well before the date of your test. Taking beta-blocker medicines may interfere with the test. In some cases, these medicines need to be changed or stopped 24 hours or more before the test.  If you wear a nitroglycerin patch, it may need to be removed prior to the test. Ask your health care provider if the patch should be removed before the test.  If you use an inhaler for any breathing condition, bring it with you to the test.  If you are an outpatient, bring a snack so you can eat right after the stress phase of the test.  Do not smoke for 4 hours prior to the test or as directed by your health care provider.  Do not apply lotions, powders, creams, or oils on your chest prior to the test.  Wear comfortable shoes and clothing. Let your health care provider know if you were unable to complete or follow the preparations for your test. PROCEDURE   Multiple patches (electrodes) will be put on your chest. If needed, small areas of your chest may be shaved to get better contact with the electrodes. Once the electrodes are attached to your body, multiple wires will be attached to the electrodes, and your heart rate will be monitored.  An IV access will be started. A nuclear trace (isotope) is given. The isotope may be given intravenously, or it may be swallowed. Nuclear refers to several types of radioactive isotopes, and the  nuclear isotope lights up the arteries so that the nuclear images are clear. The isotope is absorbed by your body. This results in low radiation exposure.  A resting nuclear image is taken to show how your heart functions at rest.  A medicine is given through the IV access.  A second scan is done about 1 hour after the medicine injection and determines how your heart functions under stress.  During this stress phase, you will be connected to an electrocardiogram machine. Your blood pressure and oxygen levels will be monitored. AFTER THE PROCEDURE   Your heart rate and blood pressure will be monitored after the test.  You may return to your normal schedule, including diet,activities, and medicines, unless your health care provider tells you otherwise.   This information is not intended to replace advice given to you by your health care provider. Make sure you discuss any questions you have with your health care provider.   Document Released: 11/05/2008 Document Revised: 06/24/2013 Document Reviewed: 02/24/2013 Elsevier Interactive Patient Education 2016 [employer]  Sexual Activity and Heart Disease Sexual intimacy  is an important part of your well-being. After a cardiac event, you may be worried about having sex. Most people can continue to have an active sex life after a cardiac event. If you or your partner have any questions about sexual activity, be sure to talk to your health care provider.  WHEN CAN YOU HAVE SEX AFTER A CARDIAC EVENT?  Resuming sexual activity (intercourse, masturbation, or oral sex) depends on the type of cardiac event you had. If you had a heart attack, it may be okay to have sex after 10 days. If you had a complicated cardiac event or heart surgery, you may not be able to have sex for as long as 8 weeks. Talk to your health care provider about safely resuming sex after your cardiac event. HOW DO YOU KNOW IF YOU ARE READY TO RETURN TO SEXUAL ACTIVITY? Returning  to sexual activity depends on your physical comfort, mental readiness, and previous sexual habits. Physically, sexual activity is no more work than climbing two flights of stairs or walking briskly for 20 minutes. If you can do these activities without having angina, having shortness of breath, or becoming overly tired, it is okay to have sex. WHAT DO I NEED TO KNOW ABOUT SEXUAL ACTIVITY AFTER A CARDIAC EVENT? After a cardiac event, some prescribed medicines may affect your sexual function. These effects can include little or no desire for sex, difficulty achieving or maintaining an erection, decreased vaginal lubrication, or the inability to achieve an orgasm. If any of these happen, do not stop taking your medicine. Talk to your health care provider about sexual dysfunction problems.   Talk to your health care provider before taking herbal medicines or vitamins. These can interfere with prescribed medicines and heart function.  If you take medicine for sexual dysfunction, avoid medicine such as nitroglycerin or long-acting nitrate medicine for 24 hours. Using a sexual dysfunction drug and a nitrate medicine together can cause a serious drop in blood pressure.  If you have angina and have taken a sexual dysfunction drug, go to your local emergency department for treatment. The emotional stress of a cardiac event can affect you and your partner's intimacy. It is important to:  Choose a relaxing atmosphere.  Talk openly and honestly with each other.  Be patient with each other.  Increase and strengthen intimacy by doing things such as caressing, touching, and holding each other. HOW DO I CARE FOR MYSELF AT HOME?  Avoid having sex after a heavy meal.  Avoid having too much alcohol before sex.  Participating in a cardiac rehabilitation program or regular exercise can benefit your sex life. It builds strength and endurance. After exercising, have sex when you feel rested and relaxed.  Ask your  partner to take a more active role during sex.  Ask your health care provider when it is okay to have anal sex.  If you have angina during sex and are not on a sexual dysfunction drug, stop having sex and take a nitroglycerin as directed. If the angina goes away, you may resume sexual activity.  Always talk to your health care provider if you are experiencing sexual dysfunction, depression, or any type of pain.   This information is not intended to replace advice given to you by your health care provider. Make sure you discuss any questions you have with your health care provider.   Document Released: 04/09/2013 Document Revised: 07/10/2014 Document Reviewed: 04/09/2013 [employer] Interactive Patient Education [employer]

## 2015-10-12 NOTE — Progress Notes (Signed)
Per Minna AntisLorna RN, Dr. Nadara EatonGangi states ok to d/c pt home; aware of vitals

## 2015-10-12 NOTE — Progress Notes (Addendum)
Dr. Jacinto HalimGanji paged while in cath lab and informed of pt's BP in the low 200's systolic.  Orders received for labetalol 15mg  IV x 1 and if not effective to give Hydralazine 10mg  IV x 1.

## 2015-10-12 NOTE — Progress Notes (Signed)
Right TR Band removed and gauze drsg with tegaderm applied. Site soft, with bruising but no hematoma. Level 1. CSM wnl. Palp right 3t radial and ulnar pulses. Pt is aware of rue restrictions and to notify MD with any s/s of bleeding or infection.

## 2015-11-02 IMAGING — RF DG THORACIC SPINE 1V
1 series · 1 of 1 positions shown · non-contrast
Comparison: Chest radiographs 04/15/2010

CLINICAL DATA: 68-year-old male undergoing thoracic spinal
stimulator placement. Sciatic pain. Initial encounter.

EXAM:
OPERATIVE THORACIC SPINE 1 VIEW

[Series 1: run · 1 of 1 slices shown]
[im 1/1]
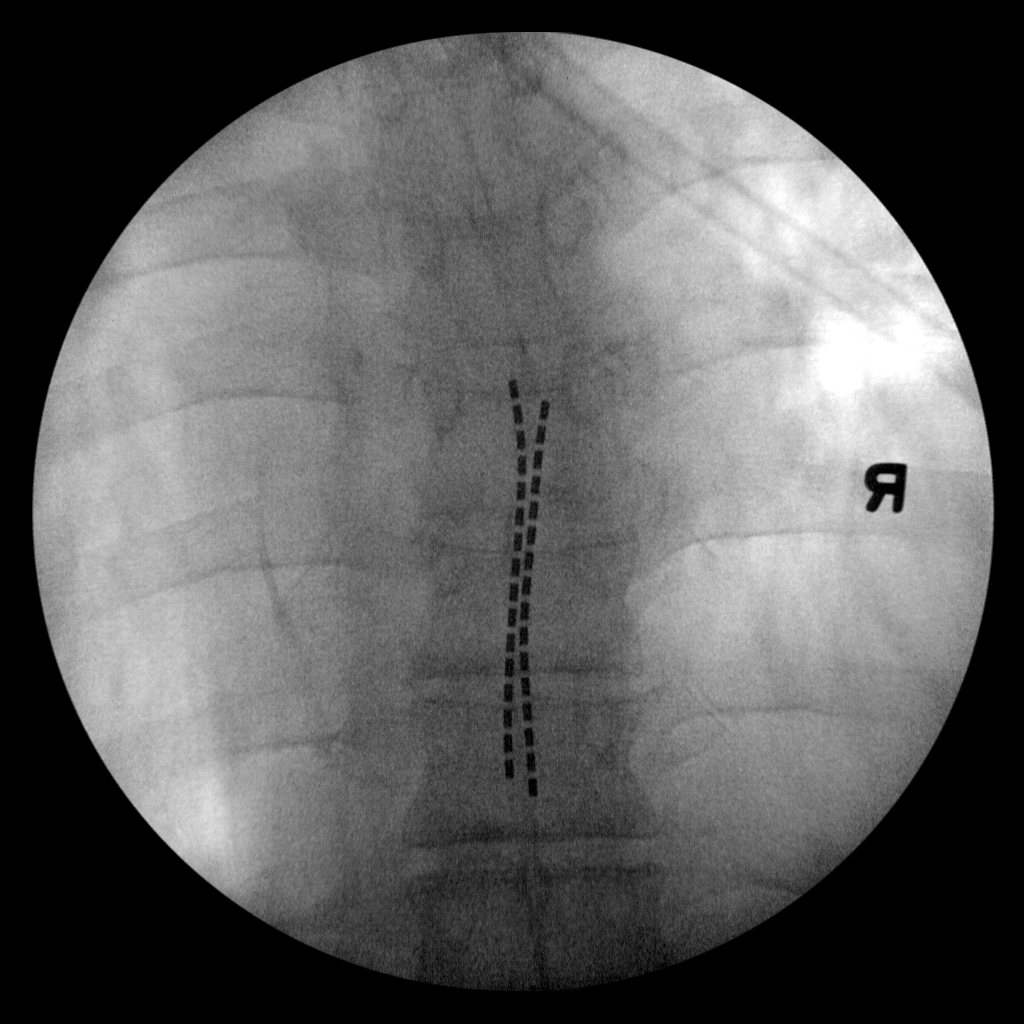

[1 of 1 positions shown; findings below may reference images not displayed]

FINDINGS: Intraoperative fluoroscopic frontal view of the thoracic spine.
Spinal stimulator device projects over the thoracic vertebral column
midline. Visible landmarks are insufficient to establish the exact
thoracic level, but given the mild degree of dextro convex curvature
when compared to the prior chest radiographs suspect the epicenter
of the stimulator is at the T7 level.
IMPRESSION: Thoracic spinal stimulator placement.

## 2015-11-05 IMAGING — CT CT L SPINE W/O CM
4 of 11 series · 13 of 34 positions shown, 14 images · non-contrast
Comparison: Lumbar MRI 07/04/2013

CLINICAL DATA: Low back pain with sciatica.  Left-sided pain.

EXAM:
CT LUMBAR SPINE WITHOUT CONTRAST
TECHNIQUE: Multidetector CT imaging of the lumbar spine was performed without
intravenous contrast administration. Multiplanar CT image
reconstructions were also generated.

[Series 2: l spine bone · axial · 0.34mm/px · z∈[-16,+71]mm · 2 of 106 slices shown, 3 images]
[im 36/106  soft-tissue]
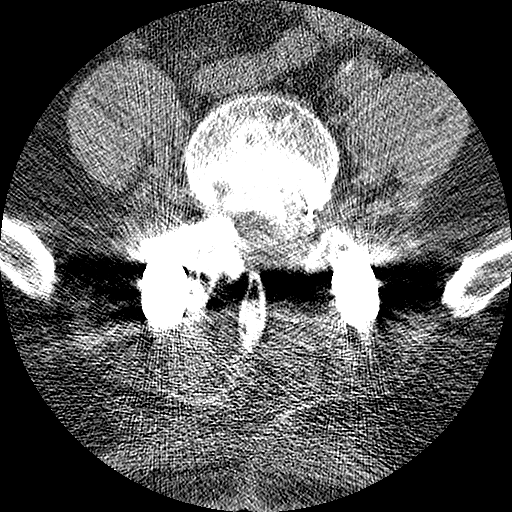
[im 36/106  bone]
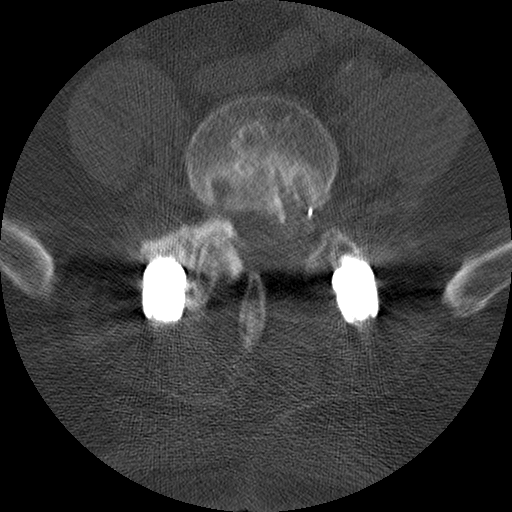
[im 71/106  bone]
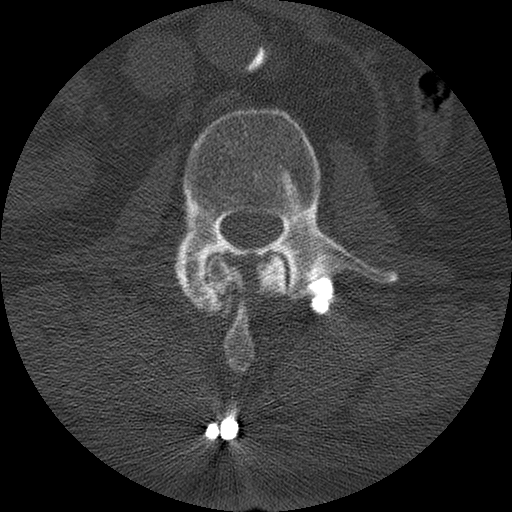

[Series 3: l spine soft · axial · 0.34mm/px · z∈[-16,+71]mm · 2 of 106 slices shown]
[im 36/106  soft-tissue]
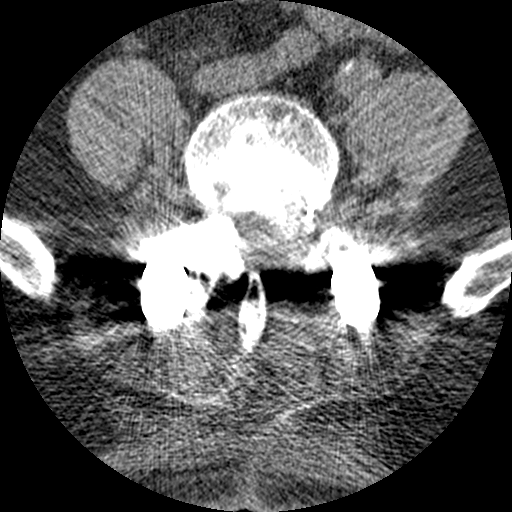
[im 71/106  soft-tissue]
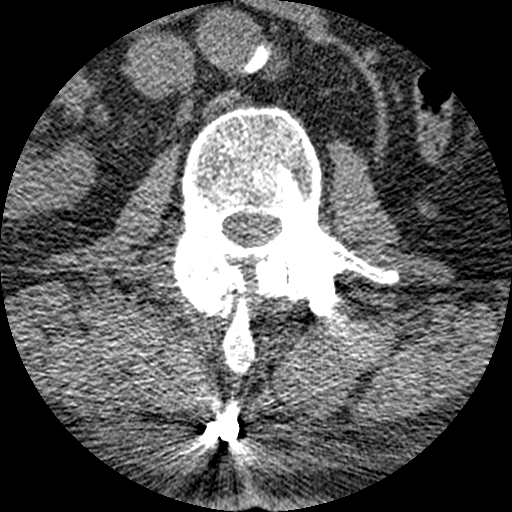

[Series 400: cor upper · coronal · 0.52mm/px · 6 of 79 slices shown]
[im 2/79  soft-tissue]
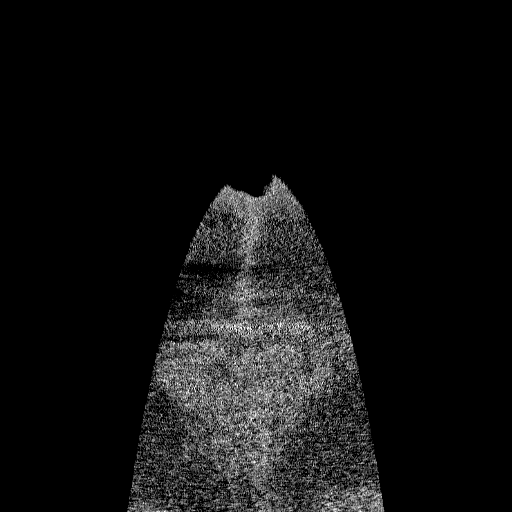
[im 14/79  bone]
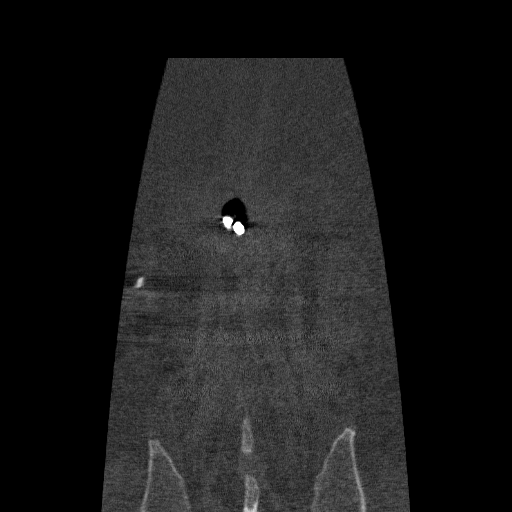
[im 27/79  bone]
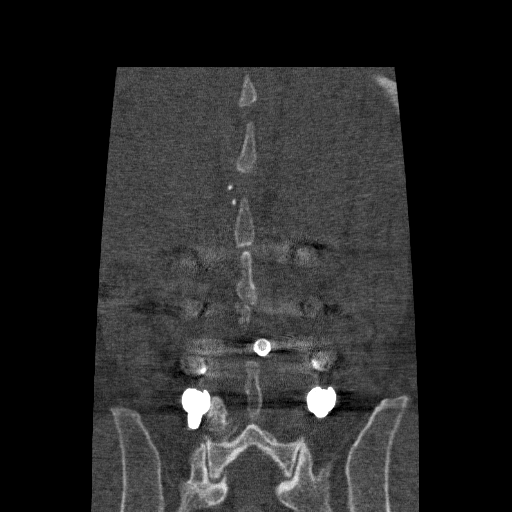
[im 40/79  bone]
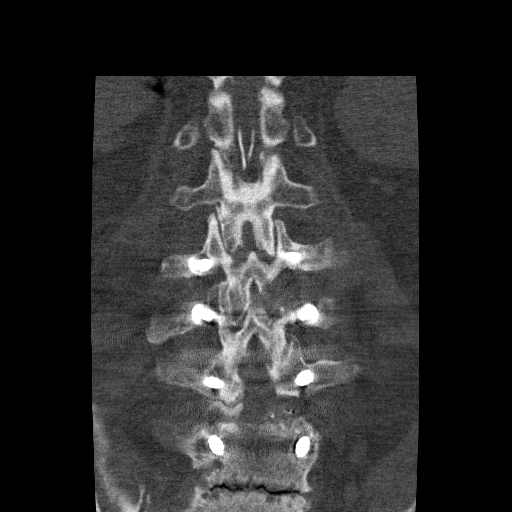
[im 53/79  bone]
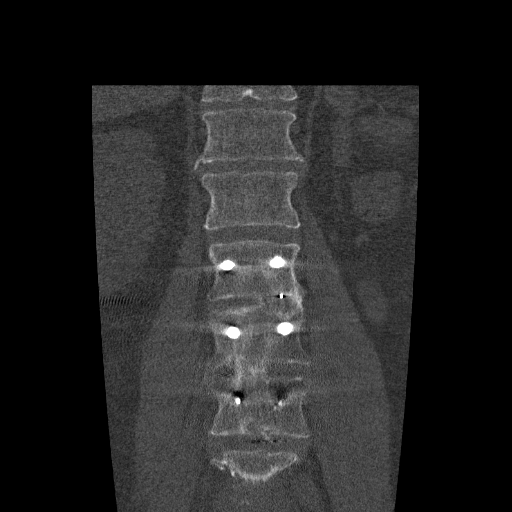
[im 66/79  bone]
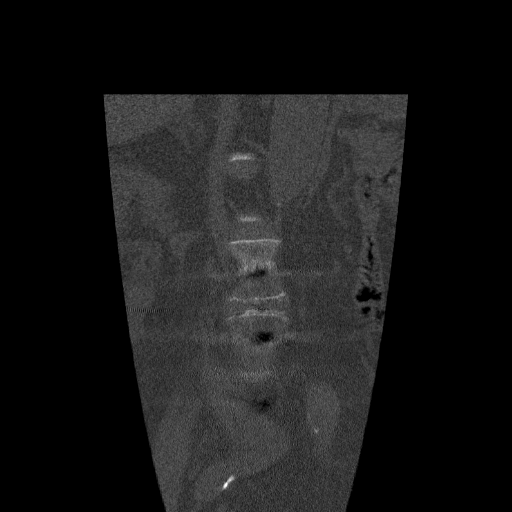

[Series 401: cor lower · coronal · 0.52mm/px · 3 of 79 slices shown]
[im 16/79  bone]
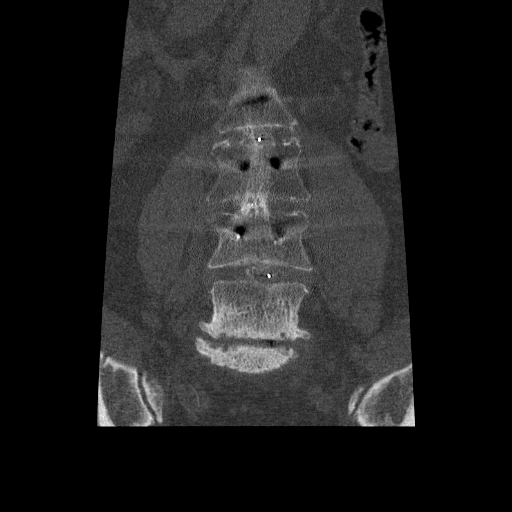
[im 32/79  bone]
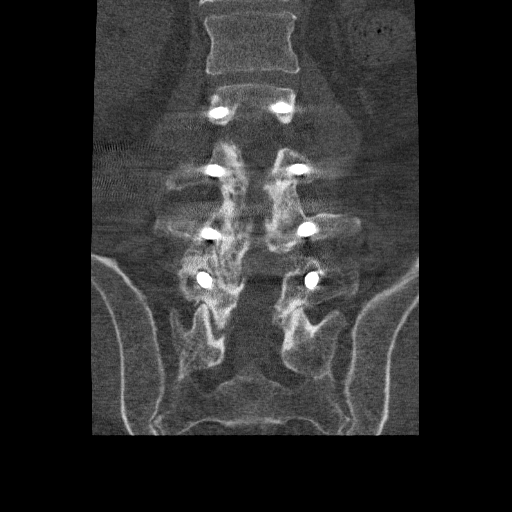
[im 47/79  bone]
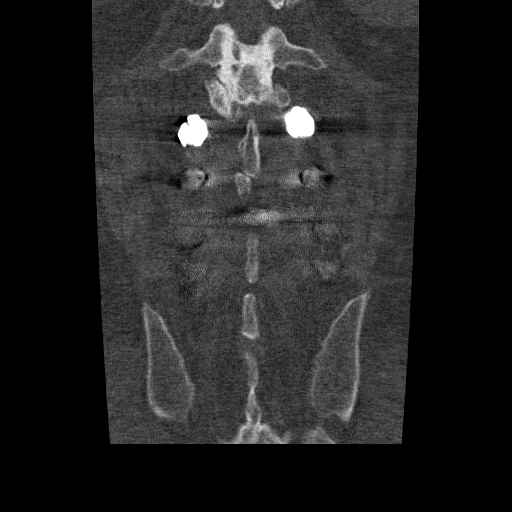

[13 of 34 positions shown; findings below may reference images not displayed]

FINDINGS: Pedicle screw and interbody fusion L2-3, L3-4 and L4-5. Spinal cord
stimulator is noted extending above the field of view.

Normal alignment.  Negative for fracture or mass lesion.

T12-L1:  Mild disc and facet degeneration with mild spinal stenosis

L1-2: Moderate facet hypertrophy. Disc bulging and mild spinal
stenosis.

L2-3: Pedicle screw and interbody fusion. Solid bony fusion. No
significant spinal stenosis

L3-4: Pedicle screw and interbody fusion. Solid fusion. Considerable
streak artifact through the canal without significant spinal
stenosis

L4-5: Pedicle screw and interbody fusion with posterior laminectomy.
Possible early bony fusion however pseudarthrosis not excluded at
this level. Marked right foraminal encroachment due to spurring.
Moderate left foraminal encroachment due to spurring

L5-S1: Disc degeneration and spondylosis. Bilateral facet
hypertrophy. Marked foraminal encroachment bilaterally with
impingement of the L5 nerve root bilaterally.
IMPRESSION: Mild spinal stenosis T12-L1 and L1-2.

Solid fusion L2-3 and L3-4. Possible pseudarthrosis versus early
fusion at L4-5.

Marked right foraminal encroachment at L4-5 with moderate left
foraminal encroachment L4-5

Marked foraminal encroachment bilaterally L5-S1.

## 2015-11-16 ENCOUNTER — Encounter (HOSPITAL_COMMUNITY)
Admission: RE | Admit: 2015-11-16 | Discharge: 2015-11-16 | Disposition: A | Discharge status: Home / Self Care | Payer: Medicare Other | Source: Ambulatory Visit | Attending: Cardiology | Admitting: Cardiology

## 2015-11-16 VITALS — Ht 69.0 in | Wt 223.3 lb

## 2015-11-16 DIAGNOSIS — I10 Essential (primary) hypertension: Secondary | ICD-10-CM | POA: Diagnosis not present

## 2015-11-16 DIAGNOSIS — Z79899 Other long term (current) drug therapy: Secondary | ICD-10-CM | POA: Diagnosis not present

## 2015-11-16 DIAGNOSIS — E7800 Pure hypercholesterolemia, unspecified: Secondary | ICD-10-CM | POA: Diagnosis not present

## 2015-11-16 DIAGNOSIS — Z955 Presence of coronary angioplasty implant and graft: Secondary | ICD-10-CM | POA: Diagnosis present

## 2015-11-16 NOTE — Progress Notes (Signed)
Cardiac Rehab Medication Review by a Pharmacist  Does the patient  feel that his/her medications are working for him/her?  yes  Has the patient been experiencing any side effects to the medications prescribed?  yes SOB from [place]il[place] (has improved).   Does the patient measure his/her own blood pressure or blood glucose at home?  no   Does the patient have any problems obtaining medications due to transportation or finances?   no  Understanding of regimen: excellent Understanding of indications: excellent Potential of compliance: excellent   Pharmacist comments: [name] has good understanding of his medications and what they are for. He reports great compliance and feels that they are working for him.  Greggory Stallionristy Reyes, PharmD Clinical Pharmacy Resident Pager # [phone] 11/16/2015 2:24 PM

## 2015-11-18 ENCOUNTER — Encounter (HOSPITAL_COMMUNITY): Payer: Self-pay

## 2015-11-18 NOTE — Progress Notes (Signed)
Cardiac Individual Treatment Plan  Patient Details  Name: Brian Gallegos MRN: [id number] Date of Birth: [birth year] Referring Provider:        CARDIAC REHAB PHASE II ORIENTATION from 11/16/2015 in [facility] CARDIAC [facility]   Referring Provider  Florian Buff MD      Initial Encounter Date:       CARDIAC REHAB PHASE II ORIENTATION from 11/16/2015 in [facility] CARDIAC REHAB   Date  11/16/15   Referring Provider  Florian Buff MD      Visit Diagnosis: Stented coronary artery  Patient's Home Medications on Admission:  Current outpatient prescriptions:  .  amitriptyline (ELAVIL) 25 MG tablet, Take 50 mg by mouth at bedtime. , Disp: , Rfl:  .  aspirin EC 81 MG tablet, Take 81 mg by mouth daily., Disp: , Rfl:  .  atenolol (TENORMIN) 50 MG tablet, Take 50 mg by mouth 2 (two) times daily. , Disp: , Rfl:  .  Cholecalciferol 1000 UNITS tablet, Take 1,000 Units by mouth daily., Disp: , Rfl:  .  cloNIDine (CATAPRES) 0.2 MG tablet, Take 0.2 mg by mouth 2 (two) times daily., Disp: , Rfl:  .  doxazosin (CARDURA) 8 MG tablet, Take 8 mg by mouth daily., Disp: , Rfl:  .  gabapentin (NEURONTIN) 400 MG capsule, Take 400 mg by mouth at bedtime., Disp: , Rfl:  .  hydrochlorothiazide (HYDRODIURIL) 25 MG tablet, Take 25 mg by mouth daily., Disp: , Rfl:  .  lisinopril (PRINIVIL,ZESTRIL) 40 MG tablet, Take 40 mg by mouth 2 (two) times daily. , Disp: , Rfl:  .  lubiprostone (AMITIZA) 8 MCG capsule, Take 8 mcg by mouth 2 (two) times daily with a meal., Disp: , Rfl:  .  methocarbamol (ROBAXIN) 750 MG tablet, Take 750 mg by mouth 3 (three) times daily as needed for muscle spasms. , Disp: , Rfl:  .  oxycodone (OXY-IR) 5 MG capsule, Take 10 mg by mouth every 8 (eight) hours., Disp: , Rfl:  .  pravastatin (PRAVACHOL) 40 MG tablet, Take 40 mg by mouth at bedtime., Disp: , Rfl:  .  rOPINIRole (REQUIP) 0.5 MG tablet, Take 1.5 mg by mouth 4 (four) times daily as needed. , Disp: ,  Rfl:  .  ticagrelor (BRILINTA) 90 MG TABS tablet, Take 1 tablet (90 mg total) by mouth 2 (two) times daily., Disp: 60 tablet, Rfl: 0 .  vitamin B-12 (CYANOCOBALAMIN) 1000 MCG tablet, Take 1,000 mcg by mouth daily., Disp: , Rfl:  .  vitamin E 400 UNIT capsule, Take 400 Units by mouth daily., Disp: , Rfl:  .  amLODipine (NORVASC) 2.5 MG tablet, Take 2.5 mg by mouth daily. Reported on 11/16/2015, Disp: , Rfl:  .  HYDROcodone-acetaminophen (NORCO) 7.5-325 MG per tablet, Take 1 tablet by mouth every 4 (four) hours as needed for moderate pain. (Patient not taking: Reported on 10/07/2015), Disp: 30 tablet, Rfl: 0 .  simvastatin (ZOCOR) 80 MG tablet, Take 40 mg by mouth every evening. Reported on 11/16/2015, Disp: , Rfl:   Past Medical History: Past Medical History  Diagnosis Date  . Hypertension   . Wears glasses   . Early cataracts, bilateral   . Hypercholesterolemia   . Restless leg syndrome   . Chronic lower back pain   . Family history of adverse reaction to anesthesia     "son had ligament repair of elbow in 2016; woke up nauseated and disoriented"  . Migraine     "none in years" (  10/12/2015)  . Arthritis     "all over"    Tobacco Use: History  Smoking status  . Never Smoker   Smokeless tobacco  . Never Used    Labs: Recent Review Flowsheet Data    Labs for ITP Cardiac and Pulmonary Rehab Latest Ref Rng 04/20/2010   Hemoglobin A1c <5.7 % 5.6 (NOTE)                                                                       According to the ADA Clinical Practice Recommendations for 2011, when HbA1c is used as a screening test:   >=6.5%   Diagnostic of Diabetes Mellitus           (if abnormal result is confirmed)  5.7-6.4%   Increased risk of developing Diabetes Mellitus  References:Diagnosis and Classification of Diabetes Mellitus,Diabetes Care,2011,34(Suppl 1):S62-S69 and Standards of Medical Care in         Diabetes - 2011,Diabetes Care,2011,34 (Suppl 1):S11-S61.      Capillary Blood  Glucose: No results found for: GLUCAP   Exercise Target Goals: Date: 11/16/15  Exercise Program Goal: Individual exercise prescription set with THRR, safety & activity barriers. Participant demonstrates ability to understand and report RPE using BORG scale, to self-measure pulse accurately, and to acknowledge the importance of the exercise prescription.  Exercise Prescription Goal: Starting with aerobic activity 30 plus minutes a day, 3 days per week for initial exercise prescription. Provide home exercise prescription and guidelines that participant acknowledges understanding prior to discharge.  Activity Barriers & Risk Stratification:     Activity Barriers & Cardiac Risk Stratification - 11/16/15 1354    Activity Barriers & Cardiac Risk Stratification   Activity Barriers Neck/Spine Problems;Joint Problems;Left Knee Replacement      6 Minute Walk:     6 Minute Walk      11/16/15 1705       6 Minute Walk   Phase Initial     Distance 770 feet     Walk Time 3.82 minutes     # of Rest Breaks 1     MPH 2.29     METS 1.63     RPE 15     VO2 Peak 5.72     Symptoms Yes (comment)     Comments Bilateral hip pain     Resting HR 66 bpm     Resting BP 132/77 mmHg     Max Ex. HR 73 bpm     Max Ex. BP 162/82 mmHg     2 Minute Post BP 138/62 mmHg        Initial Exercise Prescription:     Initial Exercise Prescription - 11/17/15 0800    Date of Initial Exercise RX and Referring Provider   Date 11/16/15   Referring Provider Florian Buff MD   Recumbant Bike   Level 1   Watts 15   Minutes 10   METs 2.47   NuStep   Level 1   Minutes 10   METs 2   Arm Ergometer   Level 1   Watts 25   Minutes 10   METs 2.31   Prescription Details   Frequency (times per week) 3   Duration Progress to 30 minutes of  continuous aerobic without signs/symptoms of physical distress   Intensity   THRR 40-80% of Max Heartrate 76-121 bpm   Ratings of Perceived Exertion 11-13   Perceived  Dyspnea 0-4   Progression   Progression Continue to progress workloads to maintain intensity without signs/symptoms of physical distress.   Resistance Training   Training Prescription Yes   Weight 1-2 lbs.      Perform Capillary Blood Glucose checks as needed.  Exercise Prescription Changes:   Exercise Comments:   Discharge Exercise Prescription (Final Exercise Prescription Changes):   Nutrition:  Target Goals: Understanding of nutrition guidelines, daily intake of sodium 1500mg , cholesterol 200mg , calories 30% from fat and 7% or less from saturated fats, daily to have 5 or more servings of fruits and vegetables.  Biometrics:     Pre Biometrics - 11/16/15 1703    Pre Biometrics   Height 5\' 9"  (1.753 m)   Weight 223 lb 5.2 oz (101.3 kg)   Waist Circumference 42.5 inches   Hip Circumference 46 inches   Waist to Hip Ratio 0.92 %   BMI (Calculated) 33   Triceps Skinfold 30 mm   % Body Fat 33.5 %   Grip Strength 42 kg   Flexibility 0 in   Single Leg Stand 6.5 seconds       Nutrition Therapy Plan and Nutrition Goals:     Nutrition Therapy & Goals - 11/17/15 0915    Nutrition Therapy   Diet Therapeutic Lifestyle Changes   Personal Nutrition Goals   Personal Goal #1 0.5-2 lb wt loss per week to a goal wt loss of 6-24 lb at graduation from Cardiac Rehab   Intervention Plan   Intervention Prescribe, educate and counsel regarding individualized specific dietary modifications aiming towards targeted core components such as weight, hypertension, lipid management, diabetes, heart failure and other comorbidities.   Expected Outcomes Short Term Goal: Understand basic principles of dietary content, such as calories, fat, sodium, cholesterol and nutrients.;Long Term Goal: Adherence to prescribed nutrition plan.      Nutrition Discharge: Nutrition Scores:   Nutrition Goals Re-Evaluation:   Psychosocial: Target Goals: Acknowledge presence or absence of depression,  maximize coping skills, provide positive support system. Participant is able to verbalize types and ability to use techniques and skills needed for reducing stress and depression.  Initial Review & Psychosocial Screening:     Initial Psych Review & Screening - 11/18/15 1244    Family Dynamics   Good Support System? Yes   Barriers   Psychosocial barriers to participate in program There are no identifiable barriers or psychosocial needs.   Screening Interventions   Interventions Encouraged to exercise      Quality of Life Scores:     Quality of Life - 11/17/15 0824    Quality of Life Scores   Health/Function Pre 22.7 %   Socioeconomic Pre 25.67 %   Psych/Spiritual Pre 26.57 %   Family Pre 27.6 %   GLOBAL Pre 24.8 %      PHQ-9:     Recent Review Flowsheet Data    There is no flowsheet data to display.      Psychosocial Evaluation and Intervention:   Psychosocial Re-Evaluation:   Vocational Rehabilitation: Provide vocational rehab assistance to qualifying candidates.   Vocational Rehab Evaluation & Intervention:     Vocational Rehab - 11/16/15 1358    Initial Vocational Rehab Evaluation & Intervention   Assessment shows need for Vocational Rehabilitation No      Education: Education Goals:  Education classes will be provided on a weekly basis, covering required topics. Participant will state understanding/return demonstration of topics presented.  Learning Barriers/Preferences:     Learning Barriers/Preferences - 11/16/15 1352    Learning Barriers/Preferences   Learning Barriers None   Learning Preferences Computer/Internet;Written Material      Education Topics: Count Your Pulse:  -Group instruction provided by verbal instruction, demonstration, patient participation and written materials to support subject.  Instructors address importance of being able to find your pulse and how to count your pulse when at home without a heart monitor.  Patients get  hands on experience counting their pulse with staff help and individually.   Heart Attack, Angina, and Risk Factor Modification:  -Group instruction provided by verbal instruction, video, and written materials to support subject.  Instructors address signs and symptoms of angina and heart attacks.    Also discuss risk factors for heart disease and how to make changes to improve heart health risk factors.   Functional Fitness:  -Group instruction provided by verbal instruction, demonstration, patient participation, and written materials to support subject.  Instructors address safety measures for doing things around the house.  Discuss how to get up and down off the floor, how to pick things up properly, how to safely get out of a chair without assistance, and balance training.   Meditation and Mindfulness:  -Group instruction provided by verbal instruction, patient participation, and written materials to support subject.  Instructor addresses importance of mindfulness and meditation practice to help reduce stress and improve awareness.  Instructor also leads participants through a meditation exercise.    Stretching for Flexibility and Mobility:  -Group instruction provided by verbal instruction, patient participation, and written materials to support subject.  Instructors lead participants through series of stretches that are designed to increase flexibility thus improving mobility.  These stretches are additional exercise for major muscle groups that are typically performed during regular warm up and cool down.   Hands Only CPR Anytime:  -Group instruction provided by verbal instruction, video, patient participation and written materials to support subject.  Instructors co-teach with AHA video for hands only CPR.  Participants get hands on experience with mannequins.   Nutrition I class: Heart Healthy Eating:  -Group instruction provided by PowerPoint slides, verbal discussion, and written  materials to support subject matter. The instructor gives an explanation and review of the Therapeutic Lifestyle Changes diet recommendations, which includes a discussion on lipid goals, dietary fat, sodium, fiber, plant stanol/sterol esters, sugar, and the components of a well-balanced, healthy diet.   Nutrition II class: Lifestyle Skills:  -Group instruction provided by PowerPoint slides, verbal discussion, and written materials to support subject matter. The instructor gives an explanation and review of label reading, grocery shopping for heart health, heart healthy recipe modifications, and ways to make healthier choices when eating out.   Diabetes Question & Answer:  -Group instruction provided by PowerPoint slides, verbal discussion, and written materials to support subject matter. The instructor gives an explanation and review of diabetes co-morbidities, pre- and post-prandial blood glucose goals, pre-exercise blood glucose goals, signs, symptoms, and treatment of hypoglycemia and hyperglycemia, and foot care basics.   Diabetes Blitz:  -Group instruction provided by PowerPoint slides, verbal discussion, and written materials to support subject matter. The instructor gives an explanation and review of the physiology behind type 1 and type 2 diabetes, diabetes medications and rational behind using different medications, pre- and post-prandial blood glucose recommendations and Hemoglobin A1c goals, diabetes diet, and  exercise including blood glucose guidelines for exercising safely.    Portion Distortion:  -Group instruction provided by PowerPoint slides, verbal discussion, written materials, and food models to support subject matter. The instructor gives an explanation of serving size versus portion size, changes in portions sizes over the last 20 years, and what consists of a serving from each food group.   Stress Management:  -Group instruction provided by verbal instruction, video, and  written materials to support subject matter.  Instructors review role of stress in heart disease and how to cope with stress positively.     Exercising on Your Own:  -Group instruction provided by verbal instruction, power point, and written materials to support subject.  Instructors discuss benefits of exercise, components of exercise, frequency and intensity of exercise, and end points for exercise.  Also discuss use of nitroglycerin and activating EMS.  Review options of places to exercise outside of rehab.  Review guidelines for sex with heart disease.   Cardiac Drugs I:  -Group instruction provided by verbal instruction and written materials to support subject.  Instructor reviews cardiac drug classes: antiplatelets, anticoagulants, beta blockers, and statins.  Instructor discusses reasons, side effects, and lifestyle considerations for each drug class.   Cardiac Drugs II:  -Group instruction provided by verbal instruction and written materials to support subject.  Instructor reviews cardiac drug classes: angiotensin converting enzyme inhibitors (ACE-I), angiotensin II receptor blockers (ARBs), nitrates, and calcium channel blockers.  Instructor discusses reasons, side effects, and lifestyle considerations for each drug class.   Anatomy and Physiology of the Circulatory System:  -Group instruction provided by verbal instruction, video, and written materials to support subject.  Reviews functional anatomy of heart, how it relates to various diagnoses, and what role the heart plays in the overall system.   Knowledge Questionnaire Score:     Knowledge Questionnaire Score - 11/17/15 0823    Knowledge Questionnaire Score   Pre Score 19/24      Core Components/Risk Factors/Patient Goals at Admission:     Personal Goals and Risk Factors at Admission - 11/16/15 1659    Core Components/Risk Factors/Patient Goals on Admission    Weight Management Yes   Intervention Weight Management:  Develop a combined nutrition and exercise program designed to reach desired caloric intake, while maintaining appropriate intake of nutrient and fiber, sodium and fats, and appropriate energy expenditure required for the weight goal.;Weight Management: Provide education and appropriate resources to help participant work on and attain dietary goals.;Weight Management/Obesity: Establish reasonable short term and long term weight goals.;Obesity: Provide education and appropriate resources to help participant work on and attain dietary goals.   Admit Weight 223 lb 5.2 oz (101.3 kg)   Goal Weight: Short Term 217 lb (98.431 kg)   Goal Weight: Long Term 200 lb (90.719 kg)   Expected Outcomes Short Term: Continue to assess and modify interventions until short term weight is achieved;Long Term: Adherence to nutrition and physical activity/exercise program aimed toward attainment of established weight goal;Weight Maintenance: Understanding of the daily nutrition guidelines, which includes 25-35% calories from fat, 7% or less cal from saturated fats, less than 200mg  cholesterol, less than 1.5gm of sodium, & 5 or more servings of fruits and vegetables daily;Weight Loss: Understanding of general recommendations for a balanced deficit meal plan, which promotes 1-2 lb weight loss per week and includes a negative energy balance of [id number] kcal/d;Understanding recommendations for meals to include 15-35% energy as protein, 25-35% energy from fat, 35-60% energy from carbohydrates, less than 200mg   of dietary cholesterol, 20-35 gm of total fiber daily;Understanding of distribution of calorie intake throughout the day with the consumption of 4-5 meals/snacks;Weight Gain: Understanding of general recommendations for a high calorie, high protein meal plan that promotes weight gain by distributing calorie intake throughout the day with the consumption for 4-5 meals, snacks, and/or supplements   Increase Strength and Stamina Yes    Intervention Provide advice, education, support and counseling about physical activity/exercise needs.;Develop an individualized exercise prescription for aerobic and resistive training based on initial evaluation findings, risk stratification, comorbidities and participant's personal goals.   Expected Outcomes Achievement of increased cardiorespiratory fitness and enhanced flexibility, muscular endurance and strength shown through measurements of functional capacity and personal statement of participant.   Hypertension Yes   Intervention Provide education on lifestyle modifcations including regular physical activity/exercise, weight management, moderate sodium restriction and increased consumption of fresh fruit, vegetables, and low fat dairy, alcohol moderation, and smoking cessation.;Monitor prescription use compliance.   Expected Outcomes Short Term: Continued assessment and intervention until BP is < 140/30mm HG in hypertensive participants. < 130/43mm HG in hypertensive participants with diabetes, heart failure or chronic kidney disease.;Long Term: Maintenance of blood pressure at goal levels.   Lipids Yes   Intervention Provide education and support for participant on nutrition & aerobic/resistive exercise along with prescribed medications to achieve LDL 70mg , HDL >40mg .   Expected Outcomes Short Term: Participant states understanding of desired cholesterol values and is compliant with medications prescribed. Participant is following exercise prescription and nutrition guidelines.;Long Term: Cholesterol controlled with medications as prescribed, with individualized exercise RX and with personalized nutrition plan. Value goals: LDL < 70mg , HDL > 40 mg.      Core Components/Risk Factors/Patient Goals Review:    Core Components/Risk Factors/Patient Goals at Discharge (Final Review):    ITP Comments:   Comments:Patient attended orientation from 1330 to 1530 to review rules and guidelines for  program. Completed 6 minute walk test, Intitial ITP, and exercise prescription.  VSS. Telemetry-Sinus Rhythm with Arrythmia. Mr Bowker complained of having bilateral hip pain 3 minutes and 49 minutes into exercise. Exercise stopped. Blood pressure 168/82 with a recheck blood pressure of 138/80 after rest. Mr Kaneshiro wears a spinal cord stimulator for chronic back pain. Will check with Mr Kapusta's orthopedic doctor and neurologist to if there are any restrictions with Mr Glorioso proceeding with exercise at cardiac rehab. Patient is agreeable to the plan.

## 2015-11-22 ENCOUNTER — Encounter (HOSPITAL_COMMUNITY): Payer: Medicare Other

## 2015-11-24 ENCOUNTER — Encounter (HOSPITAL_COMMUNITY): Payer: Medicare Other

## 2015-11-26 ENCOUNTER — Encounter (HOSPITAL_COMMUNITY)
Admission: RE | Admit: 2015-11-26 | Discharge: 2015-11-26 | Disposition: A | Discharge status: Home / Self Care | Payer: Medicare Other | Source: Ambulatory Visit

## 2015-11-26 ENCOUNTER — Encounter (HOSPITAL_COMMUNITY): Payer: Medicare Other

## 2015-11-30 ENCOUNTER — Telehealth (HOSPITAL_COMMUNITY): Payer: Self-pay | Admitting: *Deleted

## 2015-12-01 ENCOUNTER — Encounter (HOSPITAL_COMMUNITY)
Admission: RE | Admit: 2015-12-01 | Discharge: 2015-12-01 | Disposition: A | Discharge status: Home / Self Care | Payer: Medicare Other | Source: Ambulatory Visit | Attending: Cardiology | Admitting: Cardiology

## 2015-12-01 ENCOUNTER — Encounter (HOSPITAL_COMMUNITY): Payer: Medicare Other

## 2015-12-01 DIAGNOSIS — Z955 Presence of coronary angioplasty implant and graft: Secondary | ICD-10-CM | POA: Diagnosis not present

## 2015-12-01 NOTE — Progress Notes (Signed)
Daily Session Note  Patient Details  Name: Brian Gallegos MRN: [id number] Date of Birth: Mar 05, 1946 Referring Provider:        CARDIAC REHAB PHASE II ORIENTATION from 11/16/2015 in [facility]   Referring Provider  Vear Clock MD      Encounter Date: 12/01/2015  Check In:   Capillary Blood Glucose: No results found for this or any previous visit (from the past 24 hour(s)).   Goals Met:  No report of cardiac concerns or symptoms  Goals Unmet:  Not Applicable  Comments: Pt started cardiac rehab today.  Pt tolerated light exercise without difficulty. VSS, telemetry-Sinus Rhythm, asymptomatic.  Medication list reconciled. Pt denies barriers to medicaiton compliance.  PSYCHOSOCIAL ASSESSMENT:  PHQ-0. Pt exhibits positive coping skills, hopeful outlook with supportive family. No psychosocial needs identified at this time, no psychosocial interventions necessary.    Pt enjoys playing golf and spending time with his grandchildren.   Pt oriented to exercise equipment and routine.    Understanding verbalized. [name] uses a spinal cord stimulator for control of his chronic pain and is in a pain management program .  [name] did not complain  Of any pain during exercise today. [name] uses a knee brace with exercise today. [name] did limited walking and did warm up on the arm ergometer instead of walking the track.   Dr. Fransico Him is Medical Director for Cardiac Rehab at [facility].

## 2015-12-03 ENCOUNTER — Encounter (HOSPITAL_COMMUNITY): Payer: Medicare Other

## 2015-12-06 ENCOUNTER — Encounter (HOSPITAL_COMMUNITY): Payer: Medicare Other

## 2015-12-08 ENCOUNTER — Encounter (HOSPITAL_COMMUNITY)
Admission: RE | Admit: 2015-12-08 | Discharge: 2015-12-08 | Disposition: A | Discharge status: Home / Self Care | Payer: Medicare Other | Source: Ambulatory Visit | Attending: Cardiology | Admitting: Cardiology

## 2015-12-08 ENCOUNTER — Telehealth (HOSPITAL_COMMUNITY): Payer: Self-pay | Admitting: *Deleted

## 2015-12-08 ENCOUNTER — Encounter (HOSPITAL_COMMUNITY): Payer: Medicare Other

## 2015-12-08 DIAGNOSIS — Z79899 Other long term (current) drug therapy: Secondary | ICD-10-CM | POA: Insufficient documentation

## 2015-12-08 DIAGNOSIS — Z955 Presence of coronary angioplasty implant and graft: Secondary | ICD-10-CM | POA: Insufficient documentation

## 2015-12-08 DIAGNOSIS — E7800 Pure hypercholesterolemia, unspecified: Secondary | ICD-10-CM | POA: Insufficient documentation

## 2015-12-08 DIAGNOSIS — I10 Essential (primary) hypertension: Secondary | ICD-10-CM | POA: Insufficient documentation

## 2015-12-10 ENCOUNTER — Encounter (HOSPITAL_COMMUNITY): Payer: Medicare Other

## 2015-12-13 ENCOUNTER — Encounter (HOSPITAL_COMMUNITY): Payer: Medicare Other

## 2015-12-15 ENCOUNTER — Encounter (HOSPITAL_COMMUNITY): Payer: Medicare Other

## 2015-12-17 ENCOUNTER — Encounter (HOSPITAL_COMMUNITY): Payer: Medicare Other

## 2015-12-20 ENCOUNTER — Encounter (HOSPITAL_COMMUNITY): Payer: Medicare Other

## 2015-12-22 ENCOUNTER — Encounter (HOSPITAL_COMMUNITY): Payer: Medicare Other

## 2015-12-24 ENCOUNTER — Encounter (HOSPITAL_COMMUNITY): Payer: Medicare Other

## 2015-12-24 ENCOUNTER — Encounter (HOSPITAL_COMMUNITY): Payer: Self-pay | Admitting: *Deleted

## 2015-12-24 NOTE — Progress Notes (Signed)
Discharge Summary  Patient Details  Name: Brian Gallegos MRN: [id number] Date of Birth: [birth year] Referring Provider:            CARDIAC REHAB PHASE II ORIENTATION from 11/16/2015 in [facility] CARDIAC [facility]   Referring Provider  Florian Buff MD       Number of Visits: 2  Reason for Discharge:  Early Exit:  nonattendance  Smoking History:  History  Smoking status  . Never Smoker   Smokeless tobacco  . Never Used    Diagnosis:  No diagnosis found.  ADL UCSD:   Initial Exercise Prescription:     Initial Exercise Prescription - 11/17/15 0800    Date of Initial Exercise RX and Referring Provider   Date 11/16/15   Referring Provider Florian Buff MD   Recumbant Bike   Level 1   Watts 15   Minutes 10   METs 2.47   NuStep   Level 1   Minutes 10   METs 2   Arm Ergometer   Level 1   Watts 25   Minutes 10   METs 2.31   Prescription Details   Frequency (times per week) 3   Duration Progress to 30 minutes of continuous aerobic without signs/symptoms of physical distress   Intensity   THRR 40-80% of Max Heartrate 76-121 bpm   Ratings of Perceived Exertion 11-13   Perceived Dyspnea 0-4   Progression   Progression Continue to progress workloads to maintain intensity without signs/symptoms of physical distress.   Resistance Training   Training Prescription Yes   Weight 1-2 lbs.      Discharge Exercise Prescription (Final Exercise Prescription Changes):   Functional Capacity:     6 Minute Walk      11/16/15 1705       6 Minute Walk   Phase Initial     Distance 770 feet     Walk Time 3.82 minutes     # of Rest Breaks 1     MPH 2.29     METS 1.63     RPE 15     VO2 Peak 5.72     Symptoms Yes (comment)     Comments Bilateral hip pain     Resting HR 66 bpm     Resting BP 132/77 mmHg     Max Ex. HR 73 bpm     Max Ex. BP 162/82 mmHg     2 Minute Post BP 138/62 mmHg        Psychological, QOL, Others - Outcomes: PHQ  2/9: Depression screen PHQ 2/9 12/01/2015  Decreased Interest 0  Down, Depressed, Hopeless 0  PHQ - 2 Score 0    Quality of Life:     Quality of Life - 11/17/15 0824    Quality of Life Scores   Health/Function Pre 22.7 %   Socioeconomic Pre 25.67 %   Psych/Spiritual Pre 26.57 %   Family Pre 27.6 %   GLOBAL Pre 24.8 %      Personal Goals: Goals established at orientation with interventions provided to work toward goal.     Personal Goals and Risk Factors at Admission - 11/16/15 1659    Core Components/Risk Factors/Patient Goals on Admission    Weight Management Yes   Intervention Weight Management: Develop a combined nutrition and exercise program designed to reach desired caloric intake, while maintaining appropriate intake of nutrient and fiber, sodium and fats, and appropriate energy expenditure required for the weight goal.;Weight Management:  Provide education and appropriate resources to help participant work on and attain dietary goals.;Weight Management/Obesity: Establish reasonable short term and long term weight goals.;Obesity: Provide education and appropriate resources to help participant work on and attain dietary goals.   Admit Weight 223 lb 5.2 oz (101.3 kg)   Goal Weight: Short Term 217 lb (98.431 kg)   Goal Weight: Long Term 200 lb (90.719 kg)   Expected Outcomes Short Term: Continue to assess and modify interventions until short term weight is achieved;Long Term: Adherence to nutrition and physical activity/exercise program aimed toward attainment of established weight goal;Weight Maintenance: Understanding of the daily nutrition guidelines, which includes 25-35% calories from fat, 7% or less cal from saturated fats, less than 200mg  cholesterol, less than 1.5gm of sodium, & 5 or more servings of fruits and vegetables daily;Weight Loss: Understanding of general recommendations for a balanced deficit meal plan, which promotes 1-2 lb weight loss per week and includes a  negative energy balance of [phone] kcal/d;Understanding recommendations for meals to include 15-35% energy as protein, 25-35% energy from fat, 35-60% energy from carbohydrates, less than 200mg  of dietary cholesterol, 20-35 gm of total fiber daily;Understanding of distribution of calorie intake throughout the day with the consumption of 4-5 meals/snacks;Weight Gain: Understanding of general recommendations for a high calorie, high protein meal plan that promotes weight gain by distributing calorie intake throughout the day with the consumption for 4-5 meals, snacks, and/or supplements   Increase Strength and Stamina Yes   Intervention Provide advice, education, support and counseling about physical activity/exercise needs.;Develop an individualized exercise prescription for aerobic and resistive training based on initial evaluation findings, risk stratification, comorbidities and participant's personal goals.   Expected Outcomes Achievement of increased cardiorespiratory fitness and enhanced flexibility, muscular endurance and strength shown through measurements of functional capacity and personal statement of participant.   Hypertension Yes   Intervention Provide education on lifestyle modifcations including regular physical activity/exercise, weight management, moderate sodium restriction and increased consumption of fresh fruit, vegetables, and low fat dairy, alcohol moderation, and smoking cessation.;Monitor prescription use compliance.   Expected Outcomes Short Term: Continued assessment and intervention until BP is < 140/9890mm HG in hypertensive participants. < 130/7680mm HG in hypertensive participants with diabetes, heart failure or chronic kidney disease.;Long Term: Maintenance of blood pressure at goal levels.   Lipids Yes   Intervention Provide education and support for participant on nutrition & aerobic/resistive exercise along with prescribed medications to achieve LDL 70mg , HDL >40mg .   Expected  Outcomes Short Term: Participant states understanding of desired cholesterol values and is compliant with medications prescribed. Participant is following exercise prescription and nutrition guidelines.;Long Term: Cholesterol controlled with medications as prescribed, with individualized exercise RX and with personalized nutrition plan. Value goals: LDL < 70mg , HDL > 40 mg.       Personal Goals Discharge:   Nutrition & Weight - Outcomes:     Pre Biometrics - 11/16/15 1703    Pre Biometrics   Height 5\' 9"  (1.753 m)   Weight 223 lb 5.2 oz (101.3 kg)   Waist Circumference 42.5 inches   Hip Circumference 46 inches   Waist to Hip Ratio 0.92 %   BMI (Calculated) 33   Triceps Skinfold 30 mm   % Body Fat 33.5 %   Grip Strength 42 kg   Flexibility 0 in   Single Leg Stand 6.5 seconds       Nutrition:     Nutrition Therapy & Goals - 11/17/15 0915    Nutrition Therapy  Diet Therapeutic Lifestyle Changes   Personal Nutrition Goals   Personal Goal #1 0.5-2 lb wt loss per week to a goal wt loss of 6-24 lb at graduation from Cardiac Rehab   Intervention Plan   Intervention Prescribe, educate and counsel regarding individualized specific dietary modifications aiming towards targeted core components such as weight, hypertension, lipid management, diabetes, heart failure and other comorbidities.   Expected Outcomes Short Term Goal: Understand basic principles of dietary content, such as calories, fat, sodium, cholesterol and nutrients.;Long Term Goal: Adherence to prescribed nutrition plan.      Nutrition Discharge:     Nutrition Assessments - 11/24/15 1553    MEDFICTS Scores   Pre Score 32      Education Questionnaire Score:   Mr Noreene FilbertSchmidt has been discharged from cardiac rehab. Mr Noreene FilbertSchmidt did not return after his first session.Gladstone LighterMaria Whitaker, RN,BSN 01/11/2016 10:16 AM

## 2015-12-27 ENCOUNTER — Encounter (HOSPITAL_COMMUNITY): Payer: Medicare Other

## 2015-12-29 ENCOUNTER — Encounter (HOSPITAL_COMMUNITY): Payer: Medicare Other

## 2015-12-31 ENCOUNTER — Encounter (HOSPITAL_COMMUNITY): Payer: Medicare Other

## 2016-01-03 ENCOUNTER — Encounter (HOSPITAL_COMMUNITY): Payer: Medicare Other

## 2016-01-05 ENCOUNTER — Encounter (HOSPITAL_COMMUNITY): Payer: Medicare Other

## 2016-01-07 ENCOUNTER — Encounter (HOSPITAL_COMMUNITY): Payer: Medicare Other

## 2016-01-10 ENCOUNTER — Encounter (HOSPITAL_COMMUNITY): Payer: Medicare Other

## 2016-01-11 NOTE — Addendum Note (Signed)
Encounter addended by: Cammy CopaMaria W Ronin Crager, RN on: 01/11/2016 10:21 AM<BR>     Documentation filed: Episodes

## 2016-01-12 ENCOUNTER — Encounter (HOSPITAL_COMMUNITY): Payer: Medicare Other

## 2016-01-14 ENCOUNTER — Encounter (HOSPITAL_COMMUNITY): Payer: Medicare Other

## 2016-01-17 ENCOUNTER — Encounter (HOSPITAL_COMMUNITY): Payer: Medicare Other

## 2016-01-19 ENCOUNTER — Encounter (HOSPITAL_COMMUNITY): Payer: Medicare Other

## 2016-01-21 ENCOUNTER — Encounter (HOSPITAL_COMMUNITY): Payer: Medicare Other

## 2016-01-24 ENCOUNTER — Encounter (HOSPITAL_COMMUNITY): Payer: Medicare Other

## 2016-01-26 ENCOUNTER — Encounter (HOSPITAL_COMMUNITY): Payer: Medicare Other

## 2016-01-28 ENCOUNTER — Encounter (HOSPITAL_COMMUNITY): Payer: Medicare Other

## 2016-01-31 ENCOUNTER — Encounter (HOSPITAL_COMMUNITY): Payer: Medicare Other

## 2016-02-02 ENCOUNTER — Encounter (HOSPITAL_COMMUNITY): Payer: Medicare Other

## 2016-02-04 ENCOUNTER — Encounter (HOSPITAL_COMMUNITY): Payer: Medicare Other

## 2016-02-07 ENCOUNTER — Encounter (HOSPITAL_COMMUNITY): Payer: Medicare Other

## 2016-02-09 ENCOUNTER — Encounter (HOSPITAL_COMMUNITY): Payer: Medicare Other

## 2016-02-11 ENCOUNTER — Encounter (HOSPITAL_COMMUNITY): Payer: Medicare Other

## 2016-02-14 ENCOUNTER — Encounter (HOSPITAL_COMMUNITY): Payer: Medicare Other

## 2016-02-16 ENCOUNTER — Encounter (HOSPITAL_COMMUNITY): Payer: Medicare Other

## 2016-02-18 ENCOUNTER — Encounter (HOSPITAL_COMMUNITY): Payer: Medicare Other

## 2016-02-21 ENCOUNTER — Encounter (HOSPITAL_COMMUNITY): Payer: Medicare Other

## 2016-02-23 ENCOUNTER — Encounter (HOSPITAL_COMMUNITY): Payer: Medicare Other

## 2016-02-25 ENCOUNTER — Encounter (HOSPITAL_COMMUNITY): Admission: RE | Admit: 2016-02-25 | Payer: Medicare Other | Source: Ambulatory Visit

## 2016-08-09 ENCOUNTER — Ambulatory Visit (INDEPENDENT_AMBULATORY_CARE_PROVIDER_SITE_OTHER): Payer: Self-pay | Admitting: Specialist

## 2017-06-14 ENCOUNTER — Other Ambulatory Visit (INDEPENDENT_AMBULATORY_CARE_PROVIDER_SITE_OTHER): Payer: Self-pay | Admitting: Specialist

## 2017-06-14 NOTE — Telephone Encounter (Signed)
Rx request 

## 2017-10-17 ENCOUNTER — Ambulatory Visit (INDEPENDENT_AMBULATORY_CARE_PROVIDER_SITE_OTHER): Payer: Medicare Other | Admitting: Specialist

## 2017-10-17 ENCOUNTER — Encounter (INDEPENDENT_AMBULATORY_CARE_PROVIDER_SITE_OTHER): Payer: Self-pay | Admitting: Specialist

## 2017-10-17 ENCOUNTER — Ambulatory Visit (INDEPENDENT_AMBULATORY_CARE_PROVIDER_SITE_OTHER): Payer: Medicare Other

## 2017-10-17 VITALS — BP 123/70 | HR 56 | Ht 69.0 in | Wt 210.0 lb

## 2017-10-17 DIAGNOSIS — M25551 Pain in right hip: Secondary | ICD-10-CM | POA: Diagnosis not present

## 2017-10-17 DIAGNOSIS — M4726 Other spondylosis with radiculopathy, lumbar region: Secondary | ICD-10-CM | POA: Diagnosis not present

## 2017-10-17 DIAGNOSIS — M25552 Pain in left hip: Secondary | ICD-10-CM

## 2017-10-17 DIAGNOSIS — M1711 Unilateral primary osteoarthritis, right knee: Secondary | ICD-10-CM

## 2017-10-17 DIAGNOSIS — M5417 Radiculopathy, lumbosacral region: Secondary | ICD-10-CM

## 2017-10-17 DIAGNOSIS — M25561 Pain in right knee: Secondary | ICD-10-CM | POA: Diagnosis not present

## 2017-10-17 DIAGNOSIS — Z981 Arthrodesis status: Secondary | ICD-10-CM | POA: Diagnosis not present

## 2017-10-17 MED ORDER — DICLOFENAC SODIUM 1 % TD GEL: 4.0000 g | Freq: Four times a day (QID) | TRANSDERMAL | 1 refills | Status: DC

## 2017-10-17 NOTE — Patient Instructions (Signed)
Avoid bending, stooping and avoid lifting weights greater than 10 lbs. Avoid prolong standing and walking. Avoid frequent bending and stooping  No lifting greater than 10 lbs. May use ice or moist heat for pain. Weight loss is of benefit. Handicap license is approved.  Knee is suffering from osteoarthritis, only real proven treatments are Weight loss, NSIADs like diclofenac gel or CBD Oil and exercise. Well padded shoes help. Ice the knee 2-3 times a day 15-20 mins at a time.

## 2017-10-17 NOTE — Progress Notes (Signed)
Office Visit Note   Patient: Brian Gallegos           Date of Birth: 07/30/1945           MRN: [id number] Visit Date: 10/17/2017              Requested by: Sigmund Hazel, MD [address] [place], [place] [address] PCP: Sigmund Hazel, MD   Assessment & Plan: Visit Diagnoses:  1. Pain in left hip   2. Pain in right hip   3. Right knee pain, unspecified chronicity   4. Primary osteoarthritis of right knee   5. History of lumbar spinal fusion   6. Other spondylosis with radiculopathy, lumbar region   7. Lumbosacral radiculopathy at L5     Plan: Avoid bending, stooping and avoid lifting weights greater than 10 lbs. Avoid prolong standing and walking. Avoid frequent bending and stooping  No lifting greater than 10 lbs. May use ice or moist heat for pain. Weight loss is of benefit. Handicap license is approved.  Knee is suffering from osteoarthritis, only real proven treatments are Weight loss, NSIADs like diclofenac gel or CBD Oil and exercise. Well padded shoes help. Ice the knee 2-3 times a day 15-20 mins at a time.   Follow-Up Instructions: Return in about 3 months (around 01/16/2018).   Orders:  Orders Placed This Encounter  Procedures  . Large Joint Inj  . XR HIP UNILAT W OR W/O PELVIS 2-3 VIEWS LEFT  . XR HIP UNILAT W OR W/O PELVIS 2-3 VIEWS RIGHT  . XR Knee 1-2 Views Right   No orders of the defined types were placed in this encounter.     Procedures: Large Joint Inj: R knee on 10/17/2017 12:24 PM Indications: pain Details: 25 G 1.5 in needle, anterolateral approach  Arthrogram: No  Medications: 40 mg methylPREDNISolone acetate 40 MG/ML; 4 mL bupivacaine 0.25 % Outcome: tolerated well, no immediate complications  Bandaid applied  Procedure, treatment alternatives, risks and benefits explained, specific risks discussed. Consent was given by the patient. Immediately prior to procedure a time out was called to verify the correct patient, procedure,  equipment, support staff and site/side marked as required. Patient was prepped and draped in the usual sterile fashion.       Clinical Data: No additional findings.   Subjective: Chief Complaint  Patient presents with  . Right Knee - Pain  . Right Hip - Pain  . Left Hip - Pain    72 year old male with previous history of lumbar fusion L3 to L5  With spondylosis L5-S1 and now knee arthritis.  Post left knee arthroplasty 6-7 years ago.  Now with right knee pain with standing and squatting and kneeling and stairclimbing. Has no night pain uses a pillow between the legs.    Review of Systems   Objective: Vital Signs: BP 123/70 (BP Location: Right Arm, Patient Position: Sitting, Cuff Size: Normal)   Pulse (!) 56   Ht 5\' 9"  (1.753 m)   Wt 210 lb (95.3 kg)   BMI 31.01 kg/m   Physical Exam  Ortho Exam  Specialty Comments:  No specialty comments available.  Imaging: No results found.   PMFS History: Patient Active Problem List   Diagnosis Date Noted  . Post PTCA 10/12/2015  . Abnormal cardiovascular stress test 10/11/2015  . Atypical chest pain 10/11/2015   Past Medical History:  Diagnosis Date  . Arthritis    "all over"  . Chronic lower back pain   .  Early cataracts, bilateral   . Family history of adverse reaction to anesthesia    "son had ligament repair of elbow in 2016; woke up nauseated and disoriented"  . Hypercholesterolemia   . Hypertension   . Migraine    "none in years" (10/12/2015)  . Restless leg syndrome   . Wears glasses     Family History  Problem Relation Age of Onset  . Aneurysm Mother   . Heart attack Father   . Hypertension Father   . Cancer Other     Past Surgical History:  Procedure Laterality Date  . ANTERIOR CERVICAL DECOMP/DISCECTOMY FUSION  ~ 2005  . ANTERIOR CERVICAL DISCECTOMY    . BACK SURGERY    . CARDIAC CATHETERIZATION N/A 10/12/2015   Procedure: Left Heart Cath and Coronary Angiography;  Surgeon: Yates DecampJay Ganji, MD;   [facility];  Service: Cardiovascular;  Laterality: N/A;  . CARDIAC CATHETERIZATION  10/12/2015   Procedure: Coronary Stent Intervention;  Surgeon: Yates DecampJay Ganji, MD;  [facility];  Service: Cardiovascular;;  . COLONOSCOPY    . CORONARY ANGIOPLASTY  10/12/2015   DES RCA  . EYE SURGERY    . FRACTURE SURGERY    . GLAUCOMA SURGERY Left 03/2015  . JOINT REPLACEMENT    . KNEE ARTHROSCOPY Bilateral   . LUMBAR DISC SURGERY  ~ 1986-02/08/2007 X 6  . MAXIMUM ACCESS (MAS)POSTERIOR LUMBAR INTERBODY FUSION (PLIF) 3 LEVEL  02/08/2007   w/rods and screws  . SPINAL CORD STIMULATOR INSERTION N/A 05/08/2014   Procedure: LUMBAR SPINAL CORD STIMULATOR IMPLANT ;  Surgeon: Gwynne EdingerPaul C Harkins, MD;  Location: [facility] NEURO ORS;  Service: Neurosurgery;  Laterality: N/A;  . TOE FUSION Right 06/2014   "outside part of big toe"  . TOTAL KNEE ARTHROPLASTY Left ~ 2011   Social History   Occupational History  . Not on file  Tobacco Use  . Smoking status: Never Smoker  . Smokeless tobacco: Never Used  Substance and Sexual Activity  . Alcohol use: Yes    Alcohol/week: 12.6 oz    Types: 21 Cans of beer per week    Comment: "drink beer qd"  . Drug use: No  . Sexual activity: Not Currently

## 2017-10-29 MED ORDER — BUPIVACAINE HCL 0.25 % IJ SOLN
4.0000 mL | INTRAMUSCULAR | Status: AC | PRN
  Administered 2017-10-17: 4 mL via INTRA_ARTICULAR

## 2017-10-29 MED ORDER — METHYLPREDNISOLONE ACETATE 40 MG/ML IJ SUSP
40.0000 mg | INTRAMUSCULAR | Status: AC | PRN
  Administered 2017-10-17: 40 mg via INTRA_ARTICULAR

## 2018-01-16 ENCOUNTER — Ambulatory Visit (INDEPENDENT_AMBULATORY_CARE_PROVIDER_SITE_OTHER): Payer: Medicare Other | Admitting: Specialist

## 2018-05-29 ENCOUNTER — Other Ambulatory Visit (INDEPENDENT_AMBULATORY_CARE_PROVIDER_SITE_OTHER): Payer: Self-pay | Admitting: Specialist

## 2018-05-29 NOTE — Telephone Encounter (Signed)
Gabapentin refill request 

## 2018-07-25 ENCOUNTER — Ambulatory Visit (INDEPENDENT_AMBULATORY_CARE_PROVIDER_SITE_OTHER): Payer: Medicare Other

## 2018-07-25 ENCOUNTER — Encounter (INDEPENDENT_AMBULATORY_CARE_PROVIDER_SITE_OTHER): Payer: Self-pay | Admitting: Specialist

## 2018-07-25 ENCOUNTER — Ambulatory Visit (INDEPENDENT_AMBULATORY_CARE_PROVIDER_SITE_OTHER): Payer: Medicare Other | Admitting: Specialist

## 2018-07-25 VITALS — BP 163/82 | HR 53 | Ht 69.0 in | Wt 210.0 lb

## 2018-07-25 DIAGNOSIS — M542 Cervicalgia: Secondary | ICD-10-CM | POA: Diagnosis not present

## 2018-07-25 DIAGNOSIS — M4722 Other spondylosis with radiculopathy, cervical region: Secondary | ICD-10-CM

## 2018-07-25 DIAGNOSIS — M79671 Pain in right foot: Secondary | ICD-10-CM | POA: Diagnosis not present

## 2018-07-25 DIAGNOSIS — M960 Pseudarthrosis after fusion or arthrodesis: Secondary | ICD-10-CM

## 2018-07-25 DIAGNOSIS — M4322 Fusion of spine, cervical region: Secondary | ICD-10-CM | POA: Diagnosis not present

## 2018-07-25 DIAGNOSIS — S92354S Nondisplaced fracture of fifth metatarsal bone, right foot, sequela: Secondary | ICD-10-CM

## 2018-07-25 NOTE — Patient Instructions (Addendum)
Avoid overhead lifting and overhead use of the arms. Do not lift greater than 5 lbs. Adjust head rest in vehicle to prevent hyperextension if rear ended. Take extra precautions to avoid falling. Stop golf for 3-4 weeks, walk flat footed and use can right hand. Apply voltaren gel to the right lateral foot tid or qid.

## 2018-07-25 NOTE — Progress Notes (Signed)
Office Visit Note   Patient: Brian Gallegos           Date of Birth: [birth year]           MRN: [id number] Visit Date: 07/25/2018              Requested by: Sigmund Hazel, MD [address] [place], [place] [address] PCP: Sigmund Hazel, MD   Assessment & Plan: Visit Diagnoses:  1. Neck pain   2. Pain in right foot   3. Other spondylosis with radiculopathy, cervical region   4. Cervical vertebral fusion   5. Pseudarthrosis following spinal fusion   6. Closed nondisplaced fracture of fifth metatarsal bone of right foot, sequela     Plan: Avoid overhead lifting and overhead use of the arms. Do not lift greater than 5 lbs. Adjust head rest in vehicle to prevent hyperextension if rear ended. Take extra precautions to avoid falling. Stop golf for 3-4 weeks, walk flat footed and use can right hand. Apply voltaren gel to the right lateral foot tid or qid.  Follow-Up Instructions: Return in about 3 weeks (around 08/15/2018).   Orders:  Orders Placed This Encounter  Procedures  . XR Foot Complete Right  . XR Cervical Spine 2 or 3 views  . MR Cervical Spine w/o contrast   No orders of the defined types were placed in this encounter.     Procedures: No procedures performed   Clinical Data: No additional findings.   Subjective: Chief Complaint  Patient presents with  . Left Arm - Numbness, Pain  . Neck - Pain  . Right Foot - Pain    73 year old male right handed with history of L2 to L5 fusion with good improvement in leg pain and claudication due to foramenal disc protrusions and stenosis. He is having pain in the left arm with persistent numbness and tingling. He is weak in the left arm, Previous 3 level cervical fusion    Review of Systems  Constitutional: Negative.   HENT: Negative.   Eyes: Negative.   Respiratory: Negative.   Cardiovascular: Negative.   Endocrine: Negative.   Genitourinary: Negative.   Musculoskeletal: Positive for back pain, neck pain  and neck stiffness.  Skin: Negative.   Allergic/Immunologic: Negative.   Hematological: Negative.   All other systems reviewed and are negative.    Objective: Vital Signs: BP (!) 163/82   Pulse (!) 53   Ht 5\' 9"  (1.753 m)   Wt 210 lb (95.3 kg)   BMI 31.01 kg/m   Physical Exam Constitutional:      Appearance: Normal appearance. He is normal weight.  HENT:     Head: Normocephalic and atraumatic.     Mouth/Throat:     Pharynx: No oropharyngeal exudate or posterior oropharyngeal erythema.  Eyes:     Extraocular Movements: Extraocular movements intact.     Pupils: Pupils are equal, round, and reactive to light.  Pulmonary:     Effort: Pulmonary effort is normal.     Breath sounds: Normal breath sounds.  Abdominal:     General: There is no distension.     Palpations: There is no mass.  Neurological:     Mental Status: He is alert.     Right Ankle Exam   Other  Pulse: present   Comments:  Swelling right lateral 5th MT base pain with inversion right foot over insertion site of peroneus longus and brevis base of right 5th MT. Tender to palpation right  5th MT.    Back Exam   Tenderness  The patient is experiencing tenderness in the cervical.  Range of Motion  Extension: abnormal  Flexion: abnormal  Lateral bend right: abnormal  Lateral bend left: abnormal  Rotation right: abnormal  Rotation left: abnormal   Muscle Strength  Right Quadriceps:  5/5  Left Quadriceps:  5/5  Right Hamstrings:  5/5  Left Hamstrings:  5/5   Comments:  Weakness left elbow extension (triceps) 4/5, and left finger extension 4/5.       Specialty Comments:  No specialty comments available.  Imaging: Xr Cervical Spine 2 Or 3 Views  Result Date: 07/25/2018 Ap and lateral flexion and extension radiographs of cervical spine with lucency through the C6-7 ACDF the plate and screws at this level intact. Previous healed ACDF C4-5, C5-6. No soft tissue swelling. There is loss of normal  lordosis cervical spine.  Old CT Scan 05/2009 of cervical spine showed pseudarthrosis C6-7. Findings today may be old or new if interval healing occurred since CT of 2010. MRI is recommended to assess for left C7 nerve compression and acute bone changes in the area of fusion C6-7.    PMFS History: Patient Active Problem List   Diagnosis Date Noted  . Post PTCA 10/12/2015  . Abnormal cardiovascular stress test 10/11/2015  . Atypical chest pain 10/11/2015   Past Medical History:  Diagnosis Date  . Arthritis    "all over"  . Chronic lower back pain   . Early cataracts, bilateral   . Family history of adverse reaction to anesthesia    "son had ligament repair of elbow in 2016; woke up nauseated and disoriented"  . Hypercholesterolemia   . Hypertension   . Migraine    "none in years" (10/12/2015)  . Restless leg syndrome   . Wears glasses     Family History  Problem Relation Age of Onset  . Aneurysm Mother   . Heart attack Father   . Hypertension Father   . Cancer Other     Past Surgical History:  Procedure Laterality Date  . ANTERIOR CERVICAL DECOMP/DISCECTOMY FUSION  ~ 2005  . ANTERIOR CERVICAL DISCECTOMY    . BACK SURGERY    . CARDIAC CATHETERIZATION N/A 10/12/2015   Procedure: Left Heart Cath and Coronary Angiography;  Surgeon: Yates DecampJay Ganji, MD;  [facility];  Service: Cardiovascular;  Laterality: N/A;  . CARDIAC CATHETERIZATION  10/12/2015   Procedure: Coronary Stent Intervention;  Surgeon: Yates DecampJay Ganji, MD;  [facility];  Service: Cardiovascular;;  . COLONOSCOPY    . CORONARY ANGIOPLASTY  10/12/2015   DES RCA  . EYE SURGERY    . FRACTURE SURGERY    . GLAUCOMA SURGERY Left 03/2015  . JOINT REPLACEMENT    . KNEE ARTHROSCOPY Bilateral   . LUMBAR DISC SURGERY  ~ 1986-02/08/2007 X 6  . MAXIMUM ACCESS (MAS)POSTERIOR LUMBAR INTERBODY FUSION (PLIF) 3 LEVEL  02/08/2007   w/rods and screws  . SPINAL CORD STIMULATOR INSERTION N/A 05/08/2014   Procedure:  LUMBAR SPINAL CORD STIMULATOR IMPLANT ;  Surgeon: Gwynne EdingerPaul C Harkins, MD;  Location: [facility];  Service: Neurosurgery;  Laterality: N/A;  . TOE FUSION Right 06/2014   "outside part of big toe"  . TOTAL KNEE ARTHROPLASTY Left ~ 2011   Social History   Occupational History  . Not on file  Tobacco Use  . Smoking status: Never Smoker  . Smokeless tobacco: Never Used  Substance and Sexual Activity  .  Alcohol use: Yes    Alcohol/week: 21.0 standard drinks    Types: 21 Cans of beer per week    Comment: "drink beer qd"  . Drug use: No  . Sexual activity: Not Currently

## 2018-08-15 ENCOUNTER — Telehealth (INDEPENDENT_AMBULATORY_CARE_PROVIDER_SITE_OTHER): Payer: Self-pay | Admitting: Specialist

## 2018-08-15 NOTE — Telephone Encounter (Signed)
[facility]

## 2018-08-15 NOTE — Telephone Encounter (Signed)
Pt called in said he's been waiting to hear back about getting a CAT Scan scheduled due to him not being able to have a MRI done due to having spinal cord stimulator.  He has an appt scheduled with Dr.Nitka in regards to that Monday but hell need to get it rescheduled if he cant get in to do that. [phone]

## 2018-08-16 NOTE — Telephone Encounter (Signed)
Order has been changed and he can walk in but prefer to be scheduled per [facility].

## 2018-08-16 NOTE — Telephone Encounter (Signed)
I called and advised patient tat he can call [facility] and sched and if he goes today or tomorrow we should be able to get his results back on Monday in time for him appt

## 2018-08-19 ENCOUNTER — Ambulatory Visit (INDEPENDENT_AMBULATORY_CARE_PROVIDER_SITE_OTHER): Payer: Medicare Other | Admitting: Specialist

## 2018-08-21 ENCOUNTER — Ambulatory Visit
Admission: RE | Admit: 2018-08-21 | Discharge: 2018-08-21 | Disposition: A | Discharge status: Home / Self Care | Payer: Medicare PPO | Source: Ambulatory Visit | Attending: Specialist | Admitting: Specialist

## 2018-08-21 DIAGNOSIS — M4722 Other spondylosis with radiculopathy, cervical region: Secondary | ICD-10-CM

## 2018-08-21 DIAGNOSIS — M960 Pseudarthrosis after fusion or arthrodesis: Secondary | ICD-10-CM

## 2018-08-21 DIAGNOSIS — M4322 Fusion of spine, cervical region: Secondary | ICD-10-CM

## 2018-08-21 DIAGNOSIS — M542 Cervicalgia: Secondary | ICD-10-CM

## 2018-08-27 ENCOUNTER — Other Ambulatory Visit: Payer: Self-pay | Admitting: Cardiology

## 2018-09-05 ENCOUNTER — Ambulatory Visit (INDEPENDENT_AMBULATORY_CARE_PROVIDER_SITE_OTHER): Payer: Medicare Other | Admitting: Specialist

## 2018-09-05 ENCOUNTER — Encounter (INDEPENDENT_AMBULATORY_CARE_PROVIDER_SITE_OTHER): Payer: Self-pay | Admitting: Specialist

## 2018-09-05 VITALS — BP 114/68 | HR 50 | Ht 69.0 in | Wt 210.0 lb

## 2018-09-05 DIAGNOSIS — M5010 Cervical disc disorder with radiculopathy, unspecified cervical region: Secondary | ICD-10-CM

## 2018-09-05 DIAGNOSIS — M960 Pseudarthrosis after fusion or arthrodesis: Secondary | ICD-10-CM | POA: Diagnosis not present

## 2018-09-05 DIAGNOSIS — M4322 Fusion of spine, cervical region: Secondary | ICD-10-CM | POA: Diagnosis not present

## 2018-09-05 DIAGNOSIS — M6281 Muscle weakness (generalized): Secondary | ICD-10-CM | POA: Diagnosis not present

## 2018-09-05 NOTE — Progress Notes (Signed)
Office Visit Note   Patient: Brian Gallegos           Date of Birth: 07/20/1945           MRN: [id number] Visit Date: 09/05/2018              Requested by: Sigmund Hazel, MD [address] [place], [place] [address] PCP: Sigmund Hazel, MD   Assessment & Plan: Visit Diagnoses:  1. Cervical disc disorder with radiculopathy, unspecified cervical region   2. Pseudarthrosis after fusion or arthrodesis   3. Cervical vertebral fusion   4. Muscle weakness of left arm     Plan: Avoid overhead lifting and overhead use of the arms. Do not lift greater than 5 lbs. Adjust head rest in vehicle to prevent hyperextension if rear ended. Take extra precautions to avoid falling. Cervical myelogram and post myelogram CT scan is ordered to allow for evaluation of the left C7-T1 level and assess for nerve compression.   Follow-Up Instructions: Return in about 4 weeks (around 10/03/2018).   Orders:  Orders Placed This Encounter  Procedures  . DG Myelogram Cervical  . CT CERVICAL SPINE W CONTRAST   No orders of the defined types were placed in this encounter.     Procedures: No procedures performed   Clinical Data: No additional findings.   Subjective: Chief Complaint  Patient presents with  . Neck - Follow-up    CT review of Cervical Spine    73 year old right handed male retired [employer]. He had a fall [place] a curb market and landed on his right side towards the end of November 2019. He has been having difficulty with increasing pain and discomfort left shoulder and left arm with increasing weakness left arm, having problems with grip strength with the left had and opening jars. He report night pain and tingling. Holding the left arm against the chest decreases the pain. It is hard to sleep on the side right or left. Taking neurontin 300 at night  But 600 mg causes drowsiness.    Review of Systems  Constitutional: Negative.   HENT: Negative.   Eyes: Negative.   Respiratory: Negative.    Cardiovascular: Negative.   Gastrointestinal: Negative.   Endocrine: Negative.   Genitourinary: Negative.   Musculoskeletal: Negative.   Skin: Negative.   Allergic/Immunologic: Negative.   Neurological: Negative.   Hematological: Negative.   Psychiatric/Behavioral: Negative.      Objective: Vital Signs: BP 114/68 (BP Location: Left Arm, Patient Position: Sitting)   Pulse (!) 50   Ht 5\' 9"  (1.753 m)   Wt 210 lb (95.3 kg)   BMI 31.01 kg/m   Physical Exam Constitutional:      Appearance: He is well-developed.  HENT:     Head: Normocephalic and atraumatic.  Eyes:     Pupils: Pupils are equal, round, and reactive to light.  Neck:     Musculoskeletal: Normal range of motion and neck supple.  Pulmonary:     Effort: Pulmonary effort is normal.     Breath sounds: Normal breath sounds.  Abdominal:     General: Bowel sounds are normal.     Palpations: Abdomen is soft.  Skin:    General: Skin is warm and dry.  Neurological:     Mental Status: He is alert and oriented to person, place, and time.  Psychiatric:        Behavior: Behavior normal.        Thought Content: Thought content normal.  Judgment: Judgment normal.     Back Exam   Tenderness  The patient is experiencing tenderness in the cervical.  Range of Motion  Extension:  40 abnormal  Flexion:  50 abnormal  Lateral bend right: abnormal  Lateral bend left: abnormal  Rotation right: abnormal  Rotation left: abnormal   Muscle Strength  Right Quadriceps:  5/5  Left Quadriceps:  5/5  Right Hamstrings:  5/5  Left Hamstrings:  5/5   Tests  Straight leg raise right: negative Straight leg raise left: negative  Reflexes  Babinski's sign: normal   Other  Toe walk: normal Heel walk: normal Sensation: normal  Comments:  Left triceps 4/5, left Finger extension 5/5, Wrist VF 5/5, finger abduction 5-/5. Spurling positive left lower neck, tender left lower cervicothoracic lateral  masses.      Specialty Comments:  No specialty comments available.  Imaging: No results found.   PMFS History: Patient Active Problem List   Diagnosis Date Noted  . Post PTCA 10/12/2015  . Abnormal cardiovascular stress test 10/11/2015  . Atypical chest pain 10/11/2015   Past Medical History:  Diagnosis Date  . Arthritis    "all over"  . Chronic lower back pain   . Early cataracts, bilateral   . Family history of adverse reaction to anesthesia    "son had ligament repair of elbow in 2016; woke up nauseated and disoriented"  . Hypercholesterolemia   . Hypertension   . Migraine    "none in years" (10/12/2015)  . Restless leg syndrome   . Wears glasses     Family History  Problem Relation Age of Onset  . Aneurysm Mother   . Heart attack Father   . Hypertension Father   . Cancer Other     Past Surgical History:  Procedure Laterality Date  . ANTERIOR CERVICAL DECOMP/DISCECTOMY FUSION  ~ 2005  . ANTERIOR CERVICAL DISCECTOMY    . BACK SURGERY    . CARDIAC CATHETERIZATION N/A 10/12/2015   Procedure: Left Heart Cath and Coronary Angiography;  Surgeon: Yates Decamp, MD;  Location: [facility] INVASIVE CV LAB;  Service: Cardiovascular;  Laterality: N/A;  . CARDIAC CATHETERIZATION  10/12/2015   Procedure: Coronary Stent Intervention;  Surgeon: Yates Decamp, MD;  Location: [facility] INVASIVE CV LAB;  Service: Cardiovascular;;  . COLONOSCOPY    . CORONARY ANGIOPLASTY  10/12/2015   DES RCA  . EYE SURGERY    . FRACTURE SURGERY    . GLAUCOMA SURGERY Left 03/2015  . JOINT REPLACEMENT    . KNEE ARTHROSCOPY Bilateral   . LUMBAR DISC SURGERY  ~ 1986-02/08/2007 X 6  . MAXIMUM ACCESS (MAS)POSTERIOR LUMBAR INTERBODY FUSION (PLIF) 3 LEVEL  02/08/2007   w/rods and screws  . SPINAL CORD STIMULATOR INSERTION N/A 05/08/2014   Procedure: LUMBAR SPINAL CORD STIMULATOR IMPLANT ;  Surgeon: Gwynne Edinger, MD;  Location: [facility] NEURO ORS;  Service: Neurosurgery;  Laterality: N/A;  . TOE FUSION Right 06/2014   "outside  part of big toe"  . TOTAL KNEE ARTHROPLASTY Left ~ 2011   Social History   Occupational History  . Not on file  Tobacco Use  . Smoking status: Never Smoker  . Smokeless tobacco: Never Used  Substance and Sexual Activity  . Alcohol use: Yes    Alcohol/week: 21.0 standard drinks    Types: 21 Cans of beer per week    Comment: "drink beer qd"  . Drug use: No  . Sexual activity: Not Currently

## 2018-09-05 NOTE — Patient Instructions (Signed)
Avoid overhead lifting and overhead use of the arms. Do not lift greater than 5 lbs. Adjust head rest in vehicle to prevent hyperextension if rear ended. Take extra precautions to avoid falling. Cervical myelogram and post myelogram CT scan is ordered to allow for evaluation of the left C7-T1 level and assess for nerve compression.

## 2018-09-06 ENCOUNTER — Telehealth: Payer: Self-pay

## 2018-09-06 NOTE — Telephone Encounter (Signed)
Spoke with patient to screen his medications and allergies prior to being scheduled for a cervical myelogram.  He was informed he needs to hold Elavil/Amitriptyline for 48 hours before and 24 hours after the procedure.  He also understands he will be here 2-2.5 hours, will need a driver and will need to be on bedrest for 24 hours after the procedure.  Larina Earthly, RN

## 2018-09-19 ENCOUNTER — Other Ambulatory Visit: Payer: Medicare Other

## 2018-09-19 ENCOUNTER — Inpatient Hospital Stay: Admission: RE | Admit: 2018-09-19 | Payer: Medicare Other | Source: Ambulatory Visit

## 2018-10-07 ENCOUNTER — Ambulatory Visit (INDEPENDENT_AMBULATORY_CARE_PROVIDER_SITE_OTHER): Payer: Medicare Other | Admitting: Specialist

## 2018-10-26 ENCOUNTER — Other Ambulatory Visit: Payer: Self-pay | Admitting: Cardiology

## 2018-11-09 NOTE — Progress Notes (Signed)
Subjective:  Primary Physician:  Sigmund HazelMiller, Lisa, MD  Patient ID: Brian Guernseylifford P Gallegos, male    DOB: 11/05/1945, 73 y.o.   MRN: [id number]  This visit type was conducted due to national recommendations for restrictions regarding the COVID-19 Pandemic (e.g. social distancing).  This format is felt to be most appropriate for this patient at this time.  All issues noted in this document were discussed and addressed.  No physical exam was performed (except for noted visual exam findings with Telehealth visits - very limited).  The patient has consented to conduct a Telehealth visit and understands insurance will be billed.   I connected with patient, on 11/12/18  by a video enabled telemedicine application and verified that I am speaking with the correct person using two identifiers.     I discussed the limitations of evaluation and management by telemedicine and the availability of in person appointments. The patient expressed understanding and agreed to proceed.   I have discussed with patient regarding the safety during COVID Pandemic and steps and precautions including social distancing with the patient.   Chief Complaint  Patient presents with  . Coronary Artery Disease  . Follow-up    23mo    HPI: Brian Gallegos  is a 73 y.o. male. He has CAD. Coronary angiogram 10/12/2015- Normal LV systolic function. Moderate disease in the mid circumflex coronary artery of 50%. Diffuse coronary calcification involving the proximal LAD. Right coronary artery very large and dominant. Proximal segment has ulcerated 90% stenosis. Mild amount of calcification is also evident. Stenting of proximal large dominant RCA for an ulcerated 90% stenosis to 0% with implantation of a 4.0 x 18 mm resolute DES.  Patient is feeling fair, denies any chest pain, tightness or pressure. No shortness of breath. He does not have orthopnea or PND. He is not able to walk much because of chronic low-back pain and knee pain. However,  he has been working around the house and yard, plays golf sometimes.  No c/o palpitation. No history of dizziness, near syncope or syncope.  No history of swelling on the legs and no claudication. No history of bleeding from any site. No excessive bruising. No history of diffuse muscle aches, soreness or muscle weakness.  Patient has hypertension, prediabetes, hypercholesterolemia. He does not smoke. Patient has chronic low backache, has not been able to exercise much. He used to ride stationary bike, but hast been able to do it only occasionally now due to knee pain.  No history of thyroid problems. No history of TIA or CVA.     Past Medical History:  Diagnosis Date  . Arthritis    "all over"  . Chronic lower back pain   . Early cataracts, bilateral   . Family history of adverse reaction to anesthesia    "son had ligament repair of elbow in 2016; woke up nauseated and disoriented"  . Hypercholesterolemia   . Hypertension   . Migraine    "none in years" (10/12/2015)  . Restless leg syndrome   . Wears glasses     Past Surgical History:  Procedure Laterality Date  . ANTERIOR CERVICAL DECOMP/DISCECTOMY FUSION  ~ 2005  . ANTERIOR CERVICAL DISCECTOMY    . BACK SURGERY    . CARDIAC CATHETERIZATION N/A 10/12/2015   Procedure: Left Heart Cath and Coronary Angiography;  Surgeon: Yates DecampJay Ganji, MD;  [facility] LAB;  Service: Cardiovascular;  Laterality: N/A;  . CARDIAC CATHETERIZATION  10/12/2015   Procedure: Coronary Stent Intervention;  Surgeon: Yates Decamp, MD;  Location: [facility] INVASIVE CV LAB;  Service: Cardiovascular;;  . COLONOSCOPY    . CORONARY ANGIOPLASTY  10/12/2015   DES RCA  . EYE SURGERY    . FRACTURE SURGERY    . GLAUCOMA SURGERY Left 03/2015  . JOINT REPLACEMENT    . KNEE ARTHROSCOPY Bilateral   . LUMBAR DISC SURGERY  ~ 1986-02/08/2007 X 6  . MAXIMUM ACCESS (MAS)POSTERIOR LUMBAR INTERBODY FUSION (PLIF) 3 LEVEL  02/08/2007   w/rods and screws  . SPINAL CORD  STIMULATOR INSERTION N/A 05/08/2014   Procedure: LUMBAR SPINAL CORD STIMULATOR IMPLANT ;  Surgeon: Gwynne Edinger, MD;  Location: [facility] NEURO ORS;  Service: Neurosurgery;  Laterality: N/A;  . TOE FUSION Right 06/2014   "outside part of big toe"  . TOTAL KNEE ARTHROPLASTY Left ~ 2011    Social History   Socioeconomic History  . Marital status: Married    Spouse name: Not on file  . Number of children: 2  . Years of education: Not on file  . Highest education level: Not on file  Occupational History  . Not on file  Social Needs  . Financial resource strain: Not on file  . Food insecurity:    Worry: Not on file    Inability: Not on file  . Transportation needs:    Medical: Not on file    Non-medical: Not on file  Tobacco Use  . Smoking status: Never Smoker  . Smokeless tobacco: Never Used  Substance and Sexual Activity  . Alcohol use: Not Currently  . Drug use: No  . Sexual activity: Not Currently  Lifestyle  . Physical activity:    Days per week: Not on file    Minutes per session: Not on file  . Stress: Not on file  Relationships  . Social connections:    Talks on phone: Not on file    Gets together: Not on file    Attends religious service: Not on file    Active member of club or organization: Not on file    Attends meetings of clubs or organizations: Not on file    Relationship status: Not on file  . Intimate partner violence:    Fear of current or ex partner: Not on file    Emotionally abused: Not on file    Physically abused: Not on file    Forced sexual activity: Not on file  Other Topics Concern  . Not on file  Social History Narrative  . Not on file    Current Outpatient Medications on File Prior to Visit  Medication Sig Dispense Refill  . amitriptyline (ELAVIL) 25 MG tablet Take 50 mg by mouth at bedtime.     Marland Kitchen amLODipine (NORVASC) 5 MG tablet TAKE 1/2 TABLET BY MOUTH DAILY 30 tablet 1  . aspirin EC 81 MG tablet Take 81 mg by mouth daily.    Marland Kitchen atenolol  (TENORMIN) 50 MG tablet Take 50 mg by mouth 2 (two) times daily.     Marland Kitchen atorvastatin (LIPITOR) 80 MG tablet Take 80 mg by mouth daily.    . Cholecalciferol 50 MCG (2000 UT) CAPS Take 2,000 Units by mouth daily.     . cloNIDine (CATAPRES) 0.2 MG tablet Take 0.2 mg by mouth 2 (two) times daily.    . diclofenac sodium (VOLTAREN) 1 % GEL Apply 4 g topically 4 (four) times daily. (Patient taking differently: Apply 4 g topically as needed. ) 5 Tube 1  . doxazosin (CARDURA) 8  MG tablet Take 8 mg by mouth daily.    . furosemide (LASIX) 40 MG tablet TAKE 1/2 TABLET BY MOUTH TWICE DAILY 90 tablet 1  . gabapentin (NEURONTIN) 400 MG capsule Take 400 mg by mouth at bedtime.    Marland Kitchen lisinopril (PRINIVIL,ZESTRIL) 40 MG tablet Take 40 mg by mouth 2 (two) times daily.     Marland Kitchen lubiprostone (AMITIZA) 8 MCG capsule Take 8 mcg by mouth 2 (two) times daily with a meal.    . methocarbamol (ROBAXIN) 750 MG tablet Take 750 mg by mouth 3 (three) times daily as needed for muscle spasms.     . Oxycodone HCl 10 MG TABS Take 10 mg by mouth every 8 (eight) hours.     . potassium chloride (K-DUR) 10 MEQ tablet TAKE 1 TABLET BY MOUTH DAILY 30 tablet 6  . pyridoxine (B-6) 100 MG tablet Take 100 mg by mouth daily.    Marland Kitchen rOPINIRole (REQUIP) 0.5 MG tablet Take 0.5 mg by mouth 2 (two) times a day. Bedtime and prn throughout the day    . thiamine (VITAMIN B-1) 100 MG tablet Take 100 mg by mouth daily.    . vitamin B-12 (CYANOCOBALAMIN) 1000 MCG tablet Take 2,500 mcg by mouth daily.      No current facility-administered medications on file prior to visit.     Review of Systems  Constitutional: Negative for fever.  HENT: Negative for nosebleeds.   Eyes: Negative for blurred vision.  Respiratory: Negative for cough and shortness of breath.   Cardiovascular: Negative for chest pain, palpitations and leg swelling.  Gastrointestinal: Negative for abdominal pain, nausea and vomiting.  Genitourinary: Negative for dysuria.  Musculoskeletal:  Positive for back pain and joint pain (knee and other joints). Negative for myalgias.  Skin: Negative for itching and rash.  Neurological: Negative for dizziness, seizures and loss of consciousness.  Psychiatric/Behavioral: The patient is not nervous/anxious.        Objective:  Blood pressure 132/81, height 5\' 9"  (1.753 m), weight 205 lb (93 kg). Body mass index is 30.27 kg/m.  Physical Exam: Patient is alert and oriented.  He appeared very comfortable talking to me during the visit.  No further detailed physical examination was possible as it was a telemedicine visit.  CARDIAC STUDIES:  Coronary angiogram 10/12/2015: Normal LV systolic function. Moderate disease in the mid circumflex coronary artery of 50%. Diffuse coronary calcification involving the proximal LAD. Right coronary artery very large and dominant. Proximal segment has ulcerated 90% stenosis. Mild amount of calcification is also evident. Stenting proximal large dominant RCA for an ulcerated 90% stenosis to 0% with implantation of a 4.0 x 18 mm resolute DES. Lexiscan myoview stress test 10/02/2016: 1. The resting electrocardiogram demonstrated normal sinus rhythm, normal resting conduction, no resting arrhythmias and nonspecific ST-T changes. Stress EKG is non-diagnostic for ischemia as it a pharmacologic stress using Lexiscan.Stress symptoms included dyspnea. 2. The LV end diastolic volume was .Review of the raw data in a rotational cine format reveals diaphragmatic attenuation. SPECT images demonstrate Medium perfusion abnormality of mild intensity in the basal inferior, mid inferior and apical inferior myocardial wall(s) on the stress images. The defect remains relatively unchanged between rest and stress images and is a soft tissue attenuation artifact. A small sized scar cannot be excluded. The left ventricular ejection fraction was calculated or visually estimated to be 31%. Study suggests dilated non ischemic  cardiomyopathy. Compared to the study done on 09/25/2015, inferior wall ischemia is no longer present. Otherwise no  significant change. This is an intermediate risk study, clinical correlation recommended.  Echo- 09/28/2016 1. Left ventricle cavity is normal in size. Moderate to severe concentric hypertrophy of the left ventricle. Low normal decrease in global wall motion. Doppler evidence of grade I (impaired) diastolic dysfunction with elevated LV filling pressure. Calculated EF 50%. 2. Left atrial cavity is moderately dilated. 3. Right ventricle cavity is slightly dilated. Normal right ventricular function. 4. Mild aortic valve leaflet calcification. 5. Mild (Grade I) mitral regurgitation. Mild calcification of the mitral valve annulus. 6. Trace tricuspid regurgitation. 7. No diagnostic change c.f. echo. of 10/07/2015  Abdominal aortic duplex 05/30/2017: Focal plaque noted in the proximal, mid and distal aorta. The maximum aorta diameter is 2.82 cm (dist). No AAA. Normal iliac velocity.  Assessment & Recommendations:   1. Atherosclerosis of native coronary artery of native heart without angina pectoris  2. Hypertension with heart disease  3. Hypercholesterolemia   Laboratory Exam: 09/11/2018- Gluc-100,Na-138,K-4.1,BUN-17,Cratinine-0.92. HbA1C- 5.9 WBC-5.7,H/H-13.2/39.2,Plat-220  Lipid Panel  09/11/2018- Chol-125,HDL-55,LDL-67,Trig-117. Normal liver enzymes.  Recommendation:  Stable CAD without any angina or heart failure symptoms. Blood pressure is well controlled. Cholesterol is also well controlled.  I have advised him to continue all the present medications.   Secondary prevention was again explained. Patient was advised to follow strict ADA, low-salt, low-cholesterol diet.   Patient said that he is planning to have knee surgery later part of theyear.  Return for follow-up after 6 months but call us earlier if there are any cardiac problems.   Earl Many, MD, [facility]  11/12/2018, 11:40 AM [facility] [place] Pager: [phone] Office: [phone] If no answer Cell [phone]

## 2018-11-12 ENCOUNTER — Ambulatory Visit (INDEPENDENT_AMBULATORY_CARE_PROVIDER_SITE_OTHER): Payer: Medicare Other | Admitting: Cardiology

## 2018-11-12 ENCOUNTER — Other Ambulatory Visit: Payer: Self-pay

## 2018-11-12 ENCOUNTER — Telehealth: Payer: Self-pay

## 2018-11-12 ENCOUNTER — Encounter: Payer: Self-pay | Admitting: Cardiology

## 2018-11-12 VITALS — BP 132/81 | Ht 69.0 in | Wt 205.0 lb

## 2018-11-12 DIAGNOSIS — I119 Hypertensive heart disease without heart failure: Secondary | ICD-10-CM

## 2018-11-12 DIAGNOSIS — E7800 Pure hypercholesterolemia, unspecified: Secondary | ICD-10-CM | POA: Diagnosis not present

## 2018-11-12 DIAGNOSIS — I2510 Atherosclerotic heart disease of native coronary artery without angina pectoris: Secondary | ICD-10-CM

## 2018-11-12 NOTE — Telephone Encounter (Signed)
Phone call to patient to verify medication list and allergies for myelogram procedure. Pt instructed to hold Elavil for 48hrs prior to myelogram appointment time. Pt verbalized understanding. 

## 2018-11-18 ENCOUNTER — Ambulatory Visit
Admission: RE | Admit: 2018-11-18 | Discharge: 2018-11-18 | Disposition: A | Discharge status: Home / Self Care | Payer: Medicare Other | Source: Ambulatory Visit | Attending: Specialist | Admitting: Specialist

## 2018-11-18 ENCOUNTER — Other Ambulatory Visit: Payer: Self-pay

## 2018-11-18 DIAGNOSIS — M6281 Muscle weakness (generalized): Secondary | ICD-10-CM

## 2018-11-18 DIAGNOSIS — M5010 Cervical disc disorder with radiculopathy, unspecified cervical region: Secondary | ICD-10-CM

## 2018-11-18 MED ORDER — IOPAMIDOL (ISOVUE-M 300) INJECTION 61%
10.0000 mL | Freq: Once | INTRAMUSCULAR | Status: AC | PRN
  Administered 2018-11-18: 10 mL via INTRATHECAL

## 2018-11-18 MED ORDER — DIAZEPAM 5 MG PO TABS
5.0000 mg | ORAL_TABLET | Freq: Once | ORAL | Status: AC
  Administered 2018-11-18: 11:00:00 5 mg via ORAL

## 2018-11-18 NOTE — Discharge Instructions (Signed)
Myelogram Discharge Instructions  1. Go home and rest quietly for the next 24 hours.  It is important to lie flat for the next 24 hours.  Get up only to go to the restroom.  You may lie in the bed or on a couch on your back, your stomach, your left side or your right side.  You may have one pillow under your head.  You may have pillows between your knees while you are on your side or under your knees while you are on your back.  2. DO NOT drive today.  Recline the seat as far back as it will go, while still wearing your seat belt, on the way home.  3. You may get up to go to the bathroom as needed.  You may sit up for 10 minutes to eat.  You may resume your normal diet and medications unless otherwise indicated.  Drink plenty of extra fluids today and tomorrow.  4. The incidence of a spinal headache with nausea and/or vomiting is about 5% (one in 20 patients).  If you develop a headache, lie flat and drink plenty of fluids until the headache goes away.  Caffeinated beverages may be helpful.  If you develop severe nausea and vomiting or a headache that does not go away with flat bed rest, call [phone].  5. You may resume normal activities after your 24 hours of bed rest is over; however, do not exert yourself strongly or do any heavy lifting tomorrow.  6. Call your physician for a follow-up appointment.    You may resume Elavil/Amitriptyline on Tuesday, Nov 19, 2018 after 10:30a.m.

## 2018-11-18 NOTE — Progress Notes (Signed)
Patient states he has been off Elavil for at least the past two days.  

## 2018-11-21 ENCOUNTER — Telehealth: Payer: Self-pay | Admitting: Specialist

## 2018-11-21 NOTE — Telephone Encounter (Signed)
Moved appt to Wednesday 12/04/2018

## 2018-11-21 NOTE — Telephone Encounter (Signed)
Patient called in wanting to know if he could he get the results form his Myelogram prior to his appt on July 9,2020

## 2018-11-22 ENCOUNTER — Other Ambulatory Visit (INDEPENDENT_AMBULATORY_CARE_PROVIDER_SITE_OTHER): Payer: Self-pay | Admitting: Specialist

## 2018-11-22 DIAGNOSIS — M4722 Other spondylosis with radiculopathy, cervical region: Secondary | ICD-10-CM

## 2018-11-22 NOTE — Progress Notes (Signed)
Myelogram and post myelogram CT Scan of cervical spine reviewed by phone with patient, his pain is predominantly left ulnar hand with no elbow hyperflexion symptoms but positive spurling symptoms.  Will request left C7-T1 ESI for mild foramenal stenosis at the C7-T1 level below a 3 level fusion. If no relief then A C8 SNRB with Dr. Ollen Bowl.

## 2018-11-25 ENCOUNTER — Other Ambulatory Visit: Payer: Self-pay | Admitting: Cardiology

## 2018-11-26 ENCOUNTER — Other Ambulatory Visit (INDEPENDENT_AMBULATORY_CARE_PROVIDER_SITE_OTHER): Payer: Self-pay | Admitting: Specialist

## 2018-11-26 NOTE — Telephone Encounter (Signed)
Gabapentin refill request 

## 2018-11-28 DIAGNOSIS — Z981 Arthrodesis status: Secondary | ICD-10-CM | POA: Insufficient documentation

## 2018-12-04 ENCOUNTER — Ambulatory Visit: Payer: Medicare Other | Admitting: Specialist

## 2018-12-20 ENCOUNTER — Telehealth: Payer: Self-pay | Admitting: *Deleted

## 2018-12-23 ENCOUNTER — Encounter: Payer: Medicare Other | Admitting: Physical Medicine and Rehabilitation

## 2019-01-09 ENCOUNTER — Ambulatory Visit: Payer: Self-pay | Admitting: Specialist

## 2019-01-09 ENCOUNTER — Encounter: Payer: Medicare Other | Admitting: Physical Medicine and Rehabilitation

## 2019-02-15 ENCOUNTER — Other Ambulatory Visit (INDEPENDENT_AMBULATORY_CARE_PROVIDER_SITE_OTHER): Payer: Self-pay | Admitting: Specialist

## 2019-02-17 NOTE — Telephone Encounter (Signed)
Please advise 

## 2019-04-19 ENCOUNTER — Other Ambulatory Visit: Payer: Self-pay | Admitting: Cardiology

## 2019-05-26 NOTE — Progress Notes (Signed)
Primary Physician:  Sigmund Hazel, MD   Patient ID: Brian Gallegos, male    DOB: 01/12/1946, 73 y.o.   MRN: [id number]  Subjective:    Chief Complaint  Patient presents with  . Coronary Artery Disease  . Hypertension    HPI: Brian Gallegos  is a 73 y.o. male  with CAD s/p Stenting to RCA in April 2017, hypertension, hyperlipidemia, and prediabetes.  Patient is here for 6 month follow up, states that he is doing well with no complaints. He has not had any chest pain or shortness of breath.   He does not smoke. He cares for his terminally ill wife. Patient has chronic low backache and ongoing knee pain. He was originally planning on surgery this year, but has deferred for now. He has not been exercising much lately.  No history of thyroid problems. No history of TIA or CVA.   Past Medical History:  Diagnosis Date  . Arthritis    "all over"  . Chronic lower back pain   . Early cataracts, bilateral   . Family history of adverse reaction to anesthesia    "son had ligament repair of elbow in 2016; woke up nauseated and disoriented"  . Hypercholesterolemia   . Hypertension   . Migraine    "none in years" (10/12/2015)  . Restless leg syndrome   . Wears glasses     Past Surgical History:  Procedure Laterality Date  . ANTERIOR CERVICAL DECOMP/DISCECTOMY FUSION  ~ 2005  . ANTERIOR CERVICAL DISCECTOMY    . BACK SURGERY    . CARDIAC CATHETERIZATION N/A 10/12/2015   Procedure: Left Heart Cath and Coronary Angiography;  Surgeon: Yates Decamp, MD;  Location: [facility] INVASIVE CV LAB;  Service: Cardiovascular;  Laterality: N/A;  . CARDIAC CATHETERIZATION  10/12/2015   Procedure: Coronary Stent Intervention;  Surgeon: Yates Decamp, MD;  Location: [facility] INVASIVE CV LAB;  Service: Cardiovascular;;  . COLONOSCOPY    . CORONARY ANGIOPLASTY  10/12/2015   DES RCA  . EYE SURGERY    . FRACTURE SURGERY    . GLAUCOMA SURGERY Left 03/2015  . JOINT REPLACEMENT    . KNEE ARTHROSCOPY Bilateral   .  LUMBAR DISC SURGERY  ~ 1986-02/08/2007 X 6  . MAXIMUM ACCESS (MAS)POSTERIOR LUMBAR INTERBODY FUSION (PLIF) 3 LEVEL  02/08/2007   w/rods and screws  . SPINAL CORD STIMULATOR INSERTION N/A 05/08/2014   Procedure: LUMBAR SPINAL CORD STIMULATOR IMPLANT ;  Surgeon: Gwynne Edinger, MD;  Location: [facility] NEURO ORS;  Service: Neurosurgery;  Laterality: N/A;  . TOE FUSION Right 06/2014   "outside part of big toe"  . TOTAL KNEE ARTHROPLASTY Left ~ 2011    Social History   Socioeconomic History  . Marital status: Married    Spouse name: Not on file  . Number of children: 2  . Years of education: Not on file  . Highest education level: Not on file  Occupational History  . Not on file  Social Needs  . Financial resource strain: Not on file  . Food insecurity    Worry: Not on file    Inability: Not on file  . Transportation needs    Medical: Not on file    Non-medical: Not on file  Tobacco Use  . Smoking status: Never Smoker  . Smokeless tobacco: Never Used  Substance and Sexual Activity  . Alcohol use: Not Currently  . Drug use: No  . Sexual activity: Not Currently  Lifestyle  . Physical activity  Days per week: Not on file    Minutes per session: Not on file  . Stress: Not on file  Relationships  . Social Herbalist on phone: Not on file    Gets together: Not on file    Attends religious service: Not on file    Active member of club or organization: Not on file    Attends meetings of clubs or organizations: Not on file    Relationship status: Not on file  . Intimate partner violence    Fear of current or ex partner: Not on file    Emotionally abused: Not on file    Physically abused: Not on file    Forced sexual activity: Not on file  Other Topics Concern  . Not on file  Social History Narrative  . Not on file    Review of Systems  Constitution: Negative for decreased appetite, malaise/fatigue, weight gain and weight loss.  Eyes: Negative for visual disturbance.   Cardiovascular: Negative for chest pain, claudication, dyspnea on exertion, leg swelling, orthopnea, palpitations and syncope.  Respiratory: Negative for hemoptysis and wheezing.   Endocrine: Negative for cold intolerance and heat intolerance.  Hematologic/Lymphatic: Does not bruise/bleed easily.  Skin: Negative for nail changes.  Musculoskeletal: Negative for muscle weakness and myalgias.  Gastrointestinal: Negative for abdominal pain, change in bowel habit, nausea and vomiting.  Neurological: Negative for difficulty with concentration, dizziness, focal weakness and headaches.  Psychiatric/Behavioral: Negative for altered mental status and suicidal ideas.  All other systems reviewed and are negative.     Objective:  Blood pressure 140/86, pulse (!) 59, height 5\' 9"  (1.753 m), weight 217 lb (98.4 kg), SpO2 97 %. Body mass index is 32.05 kg/m.    Physical Exam  Constitutional: He is oriented to person, place, and time. Vital signs are normal. He appears well-developed and well-nourished.  HENT:  Head: Normocephalic and atraumatic.  Neck: Normal range of motion.  Cardiovascular: Normal rate, regular rhythm, normal heart sounds and intact distal pulses.  Pulmonary/Chest: Effort normal and breath sounds normal. No accessory muscle usage. No respiratory distress.  Abdominal: Soft. Bowel sounds are normal.  Musculoskeletal: Normal range of motion.  Neurological: He is alert and oriented to person, place, and time.  Skin: Skin is warm and dry.  Vitals reviewed.  Radiology: No results found.  Laboratory examination:   09/11/2018- Gluc-100,Na-138,K-4.1,BUN-17,Cratinine-0.92. HbA1C- 5.9 WBC-5.7,H/H-13.2/39.2,Plat-220  Lipid Panel  09/11/2018- Chol-125,HDL-55,LDL-67,Trig-117. Normal liver enzymes.  CMP Latest Ref Rng & Units 04/29/2014 04/20/2010 04/15/2010  Glucose 70 - 99 mg/dL 106(H) 159(H) 109(H)  BUN 6 - 23 mg/dL 10 11 10   Creatinine 0.50 - 1.35 mg/dL 0.92 1.02 0.91  Sodium 137  - 147 mEq/L 138 137 141  Potassium 3.7 - 5.3 mEq/L 4.1 3.9 4.0  Chloride 96 - 112 mEq/L 99 107 107  CO2 19 - 32 mEq/L 27 26 29   Calcium 8.4 - 10.5 mg/dL 9.3 7.8(L) 9.0  Total Protein 6.0 - 8.3 g/dL - - 6.3  Total Bilirubin 0.3 - 1.2 mg/dL - - 0.8  Alkaline Phos 39 - 117 U/L - - 102  AST 0 - 37 U/L - - 24  ALT 0 - 53 U/L - - 21   CBC Latest Ref Rng & Units 04/29/2014 04/22/2010 04/21/2010  WBC 4.0 - 10.5 K/uL 6.5 13.3(H) 17.7(H)  Hemoglobin 13.0 - 17.0 g/dL 13.0 9.7(L) 10.5(L)  Hematocrit 39.0 - 52.0 % 38.4(L) 29.5(L) 32.0(L)  Platelets 150 - 400 K/uL 210 146(L) 152  Lipid Panel  No results found for: CHOL, TRIG, HDL, CHOLHDL, VLDL, LDLCALC, LDLDIRECT HEMOGLOBIN A1C Lab Results  Component Value Date   HGBA1C  04/20/2010    5.6 (NOTE)                                                                       According to the ADA Clinical Practice Recommendations for 2011, when HbA1c is used as a screening test:   >=6.5%   Diagnostic of Diabetes Mellitus           (if abnormal result  is confirmed)  5.7-6.4%   Increased risk of developing Diabetes Mellitus  References:Diagnosis and Classification of Diabetes Mellitus,Diabetes Care,2011,34(Suppl 1):S62-S69 and Standards of Medical Care in         Diabetes - 2011,Diabetes Care,2011,34  (Suppl 1):S11-S61.   MPG 114 04/20/2010   TSH No results for input(s): TSH in the last 8760 hours.  PRN Meds:. Medications Discontinued During This Encounter  Medication Reason  . gabapentin (NEURONTIN) 400 MG capsule Error  . potassium chloride (KLOR-CON) 10 MEQ tablet Error   Current Meds  Medication Sig  . amitriptyline (ELAVIL) 25 MG tablet Take 50 mg by mouth at bedtime.   Marland Kitchen amLODipine (NORVASC) 5 MG tablet TAKE 1/2 TABLET BY MOUTH DAILY  . aspirin EC 81 MG tablet Take 81 mg by mouth daily.  Marland Kitchen atenolol (TENORMIN) 50 MG tablet Take 50 mg by mouth 2 (two) times daily.   Marland Kitchen atorvastatin (LIPITOR) 80 MG tablet Take 80 mg by mouth daily.  .  Cholecalciferol 50 MCG (2000 UT) CAPS Take 2,000 Units by mouth daily.   . cloNIDine (CATAPRES) 0.2 MG tablet Take 0.2 mg by mouth 2 (two) times daily.  . diclofenac sodium (VOLTAREN) 1 % GEL Apply 4 g topically 4 (four) times daily. (Patient taking differently: Apply 4 g topically as needed. )  . doxazosin (CARDURA) 8 MG tablet Take 8 mg by mouth daily.  . furosemide (LASIX) 40 MG tablet TAKE 1/2 TABLET BY MOUTH TWICE DAILY  . gabapentin (NEURONTIN) 300 MG capsule TAKE 1 CAPSULE BY MOUTH THREE TIMES DAILY  . lisinopril (PRINIVIL,ZESTRIL) 40 MG tablet Take 40 mg by mouth 2 (two) times daily.   Marland Kitchen lubiprostone (AMITIZA) 8 MCG capsule Take 8 mcg by mouth 2 (two) times daily with a meal.  . methocarbamol (ROBAXIN) 750 MG tablet Take 750 mg by mouth daily.   . Oxycodone HCl 10 MG TABS Take 10 mg by mouth every 8 (eight) hours.   . potassium chloride (KLOR-CON) 10 MEQ tablet TAKE 1 TABLET BY MOUTH DAILY  . pyridoxine (B-6) 100 MG tablet Take 100 mg by mouth daily.  Marland Kitchen rOPINIRole (REQUIP) 0.5 MG tablet Take 0.5 mg by mouth 2 (two) times a day. Bedtime and prn throughout the day  . thiamine (VITAMIN B-1) 100 MG tablet Take 100 mg by mouth daily.  . vitamin B-12 (CYANOCOBALAMIN) 1000 MCG tablet Take 1,000 mcg by mouth daily.     Cardiac Studies:   Coronary angiogram 10/12/2015: Normal LV systolic function. Moderate disease in the mid circumflex coronary artery of 50%. Diffuse coronary calcification involving the proximal LAD. Right coronary artery very large and dominant. Proximal segment has ulcerated 90% stenosis. Mild amount  of calcification is also evident. Stenting proximal large dominant RCA for an ulcerated 90% stenosis to 0% with implantation of a 4.0 x 18 mm resolute DES.  Lexiscan myoview stress test 10/02/2016: 1. The resting electrocardiogram demonstrated normal sinus rhythm, normal resting conduction, no resting arrhythmias and nonspecific ST-T changes. Stress EKG is non-diagnostic for  ischemia as it a pharmacologic stress using Lexiscan.Stress symptoms included dyspnea. 2. The LV end diastolic volume was 157mL.Review of the raw data in a rotational cine format reveals diaphragmatic attenuation. SPECT images demonstrate Medium perfusion abnormality of mild intensity in the basal inferior, mid inferior and apical inferior myocardial wall(s) on the stress images. The defect remains relatively unchanged between rest and stress images and is a soft tissue attenuation artifact. A small sized scar cannot be excluded. The left ventricular ejection fraction was calculated or visually estimated to be 31%. Study suggests dilated non ischemic cardiomyopathy. Compared to the study done on 09/25/2015, inferior wall ischemia is no longer present. Otherwise no significant change. This is an intermediate risk study, clinical correlation recommended.  Echo- 09/28/2016 1. Left ventricle cavity is normal in size. Moderate to severe concentric hypertrophy of the left ventricle. Low normal decrease in global wall motion. Doppler evidence of grade I (impaired) diastolic dysfunction with elevated LV filling pressure. Calculated EF 50%. 2. Left atrial cavity is moderately dilated. 3. Right ventricle cavity is slightly dilated. Normal right ventricular function. 4. Mild aortic valve leaflet calcification. 5. Mild (Grade I) mitral regurgitation. Mild calcification of the mitral valve annulus. 6. Trace tricuspid regurgitation. 7. No diagnostic change c.f. echo. of 10/07/2015  Abdominal aortic duplex 05/30/2017: Focal plaque noted in the proximal, mid and distal aorta. The maximum aorta diameter is 2.82 cm (dist). No AAA. Normal iliac velocity.  Assessment:   Atherosclerosis of native coronary artery of native heart without angina pectoris - Plan: EKG 12-Lead  Hypertension with heart disease  Hypercholesterolemia  EKG 05/27/2019: Sinus bradycardia at 58 bpm, normal axis, PRWP cannot exclude  anterior infarct old. No evidence of ischemia.   Recommendations:   Patient is here in a 5832-month office visit and follow-up for CAD.  No symptoms of angina or clinical evidence of heart failure.  He is overall doing well and tolerating medications well.  His blood pressure was slightly elevated today, he has not been monitoring regularly at home.  He will start home monitoring and notify me if continues to be elevated and will make further medication changes at that time.  I have encouraged him to start back to regular exercise at least a few days a week.  He is scheduled to have labs at the [facility] on 12/4, he will bring us a copy for our records.  Continue with Lipitor 80 mg daily for management of hyperlipidemia.  He will need to continue with aspirin indefinitely in view of CAD.  I will see him back in 6 months for follow-up, but encouraged him to contact us sooner if needed.  Toniann FailAshton Haynes Kelley, MSN, APRN, FNP-C [facility] Office: [phone] Fax: [phone]

## 2019-05-27 ENCOUNTER — Ambulatory Visit: Payer: Medicare Other | Admitting: Cardiology

## 2019-05-27 ENCOUNTER — Encounter: Payer: Self-pay | Admitting: Cardiology

## 2019-05-27 ENCOUNTER — Other Ambulatory Visit: Payer: Self-pay

## 2019-05-27 VITALS — BP 140/86 | HR 59 | Ht 69.0 in | Wt 217.0 lb

## 2019-05-27 DIAGNOSIS — I119 Hypertensive heart disease without heart failure: Secondary | ICD-10-CM

## 2019-05-27 DIAGNOSIS — E7800 Pure hypercholesterolemia, unspecified: Secondary | ICD-10-CM | POA: Diagnosis not present

## 2019-05-27 DIAGNOSIS — I2510 Atherosclerotic heart disease of native coronary artery without angina pectoris: Secondary | ICD-10-CM

## 2019-07-14 ENCOUNTER — Other Ambulatory Visit: Payer: Self-pay | Admitting: Cardiology

## 2019-09-26 ENCOUNTER — Other Ambulatory Visit: Payer: Self-pay

## 2019-09-26 ENCOUNTER — Ambulatory Visit: Payer: Self-pay

## 2019-09-26 ENCOUNTER — Ambulatory Visit: Payer: Medicare Other | Admitting: Specialist

## 2019-09-26 ENCOUNTER — Encounter: Payer: Self-pay | Admitting: Specialist

## 2019-09-26 VITALS — BP 147/82 | HR 52 | Ht 70.0 in | Wt 205.0 lb

## 2019-09-26 DIAGNOSIS — M25552 Pain in left hip: Secondary | ICD-10-CM

## 2019-09-26 DIAGNOSIS — M25559 Pain in unspecified hip: Secondary | ICD-10-CM

## 2019-09-26 DIAGNOSIS — M4322 Fusion of spine, cervical region: Secondary | ICD-10-CM

## 2019-09-26 DIAGNOSIS — M545 Low back pain, unspecified: Secondary | ICD-10-CM

## 2019-09-26 DIAGNOSIS — M7062 Trochanteric bursitis, left hip: Secondary | ICD-10-CM | POA: Diagnosis not present

## 2019-09-26 DIAGNOSIS — M51379 Other intervertebral disc degeneration, lumbosacral region without mention of lumbar back pain or lower extremity pain: Secondary | ICD-10-CM

## 2019-09-26 DIAGNOSIS — M4726 Other spondylosis with radiculopathy, lumbar region: Secondary | ICD-10-CM

## 2019-09-26 DIAGNOSIS — M5137 Other intervertebral disc degeneration, lumbosacral region: Secondary | ICD-10-CM

## 2019-09-26 MED ORDER — BUPIVACAINE HCL 0.5 % IJ SOLN
3.0000 mL | INTRAMUSCULAR | Status: AC | PRN
  Administered 2019-09-26: 3 mL via INTRA_ARTICULAR

## 2019-09-26 MED ORDER — METHYLPREDNISOLONE ACETATE 40 MG/ML IJ SUSP
40.0000 mg | INTRAMUSCULAR | Status: AC | PRN
  Administered 2019-09-26: 11:00:00 40 mg via INTRA_ARTICULAR

## 2019-09-26 NOTE — Progress Notes (Signed)
Office Visit Note   Patient: Brian Gallegos           Date of Birth: October 22, 1945           MRN: [id number] Visit Date: 09/26/2019              Requested by: Sigmund Hazel, MD [address] [place],  [place] [address] PCP: Sigmund Hazel, MD   Assessment & Plan: Visit Diagnoses:  1. Hip pain   2. Left-sided low back pain without sciatica, unspecified chronicity     Plan: Avoid bending, stooping and avoid lifting weights greater than 10 lbs. Avoid prolong standing and walking. Avoid frequent bending and stooping  No lifting greater than 10 lbs. May use ice or moist heat for pain. Weight loss is of benefit. Handicap license is approved. Call if the pain recurrs and we would arrange for Dr. Fridley Blas secretary/Assistant to call to arrange for epidural steroid injection Stretching exercises for greater trochanteric bursitis  Follow-Up Instructions: No follow-ups on file.   Orders:  Orders Placed This Encounter  Procedures  . XR Lumbar Spine 2-3 Views  . XR HIP UNILAT W OR W/O PELVIS 2-3 VIEWS LEFT   No orders of the defined types were placed in this encounter.     Procedures: Large Joint Inj: L greater trochanter on 09/26/2019 10:48 AM Indications: pain Details: 25 G 3.5 in and 1.5 in needle, lateral approach  Arthrogram: No  Medications: 40 mg methylPREDNISolone acetate 40 MG/ML; 3 mL bupivacaine 0.5 % Outcome: tolerated well, no immediate complications Procedure, treatment alternatives, risks and benefits explained, specific risks discussed. Consent was given by the patient. Immediately prior to procedure a time out was called to verify the correct patient, procedure, equipment, support staff and site/side marked as required. Patient was prepped and draped in the usual sterile fashion.       Clinical Data: No additional findings.   Subjective: Chief Complaint  Patient presents with  . Left Hip - Pain    74 year old male with history of lumbar fusion L2  to L5, He has been feeding and caring for his dog and bending and stooping. More recently he had increase in left hip pain. The pain is greater with longer distance walking. Sleeping at night increase pain when on The left side and has to sleep on back or right side. No bowel or bladder difficulty. No numbness or tingling. Limping on the left the right Side was experiencing some pain. He has no difficulty reaching his his left foot.    Review of Systems  Constitutional: Positive for activity change. Negative for appetite change, chills, diaphoresis, fatigue, fever and unexpected weight change.  HENT: Negative.  Negative for congestion, dental problem, drooling, ear discharge, ear pain, facial swelling, hearing loss, mouth sores, nosebleeds, postnasal drip, rhinorrhea, sinus pressure, sinus pain, sneezing, sore throat, tinnitus, trouble swallowing and voice change.   Eyes: Negative.  Negative for photophobia, pain, discharge, redness, itching and visual disturbance.  Respiratory: Negative.  Negative for apnea, cough, choking, chest tightness, shortness of breath, wheezing and stridor.   Cardiovascular: Negative.  Negative for chest pain, palpitations and leg swelling.  Gastrointestinal: Negative.  Negative for abdominal distention, abdominal pain, anal bleeding, blood in stool, constipation, diarrhea, nausea, rectal pain and vomiting.  Endocrine: Negative.  Negative for cold intolerance, heat intolerance, polydipsia, polyphagia and polyuria.  Genitourinary: Negative.  Negative for difficulty urinating, dysuria, enuresis, flank pain and frequency.  Musculoskeletal: Positive for arthralgias, back pain and  gait problem. Negative for joint swelling, myalgias, neck pain and neck stiffness.  Skin: Negative.  Negative for color change, pallor, rash and wound.  Allergic/Immunologic: Negative.  Negative for environmental allergies, food allergies and immunocompromised state.  Neurological: Negative for  dizziness, tremors, seizures, syncope, facial asymmetry, speech difficulty, weakness, light-headedness, numbness and headaches.  Hematological: Negative.  Negative for adenopathy. Does not bruise/bleed easily.  Psychiatric/Behavioral: Negative.  Negative for agitation, behavioral problems, confusion, decreased concentration, dysphoric mood, hallucinations, self-injury, sleep disturbance and suicidal ideas. The patient is not nervous/anxious and is not hyperactive.      Objective: Vital Signs: BP (!) 147/82   Pulse (!) 52   Ht 5\' 10"  (1.778 m)   Wt 205 lb (93 kg)   BMI 29.41 kg/m   Physical Exam Constitutional:      Appearance: He is well-developed.  HENT:     Head: Normocephalic and atraumatic.  Eyes:     Pupils: Pupils are equal, round, and reactive to light.  Pulmonary:     Effort: Pulmonary effort is normal.     Breath sounds: Normal breath sounds.  Abdominal:     General: Bowel sounds are normal.     Palpations: Abdomen is soft.  Musculoskeletal:        General: Normal range of motion.     Cervical back: Normal range of motion and neck supple.  Skin:    General: Skin is warm and dry.  Neurological:     Mental Status: He is alert and oriented to person, place, and time.  Psychiatric:        Behavior: Behavior normal.        Thought Content: Thought content normal.        Judgment: Judgment normal.     Ortho Exam  Specialty Comments:  No specialty comments available.  Imaging: No results found.   PMFS History: Patient Active Problem List   Diagnosis Date Noted  . Post PTCA 10/12/2015  . Abnormal cardiovascular stress test 10/11/2015  . Atypical chest pain 10/11/2015   Past Medical History:  Diagnosis Date  . Arthritis    "all over"  . Chronic lower back pain   . Early cataracts, bilateral   . Family history of adverse reaction to anesthesia    "son had ligament repair of elbow in 2016; woke up nauseated and disoriented"  . Hypercholesterolemia   .  Hypertension   . Migraine    "none in years" (10/12/2015)  . Restless leg syndrome   . Wears glasses     Family History  Problem Relation Age of Onset  . Aneurysm Mother   . Heart attack Father   . Hypertension Father   . Cancer Other     Past Surgical History:  Procedure Laterality Date  . ANTERIOR CERVICAL DECOMP/DISCECTOMY FUSION  ~ 2005  . ANTERIOR CERVICAL DISCECTOMY    . BACK SURGERY    . CARDIAC CATHETERIZATION N/A 10/12/2015   Procedure: Left Heart Cath and Coronary Angiography;  Surgeon: 12/12/2015, MD;  Location: [facility] INVASIVE CV LAB;  Service: Cardiovascular;  Laterality: N/A;  . CARDIAC CATHETERIZATION  10/12/2015   Procedure: Coronary Stent Intervention;  Surgeon: 12/12/2015, MD;  Location: [facility] INVASIVE CV LAB;  Service: Cardiovascular;;  . COLONOSCOPY    . CORONARY ANGIOPLASTY  10/12/2015   DES RCA  . EYE SURGERY    . FRACTURE SURGERY    . GLAUCOMA SURGERY Left 03/2015  . JOINT REPLACEMENT    . KNEE ARTHROSCOPY Bilateral   .  LUMBAR DISC SURGERY  ~ 1986-02/08/2007 X 6  . MAXIMUM ACCESS (MAS)POSTERIOR LUMBAR INTERBODY FUSION (PLIF) 3 LEVEL  02/08/2007   w/rods and screws  . SPINAL CORD STIMULATOR INSERTION N/A 05/08/2014   Procedure: LUMBAR SPINAL CORD STIMULATOR IMPLANT ;  Surgeon: Bonna Gains, MD;  Location: [facility];  Service: Neurosurgery;  Laterality: N/A;  . TOE FUSION Right 06/2014   "outside part of big toe"  . TOTAL KNEE ARTHROPLASTY Left ~ 2011   Social History   Occupational History  . Not on file  Tobacco Use  . Smoking status: Never Smoker  . Smokeless tobacco: Never Used  Substance and Sexual Activity  . Alcohol use: Not Currently  . Drug use: No  . Sexual activity: Not Currently

## 2019-09-26 NOTE — Patient Instructions (Addendum)
Avoid bending, stooping and avoid lifting weights greater than 10 lbs. Avoid prolong standing and walking. Avoid frequent bending and stooping  No lifting greater than 10 lbs. May use ice or moist heat for pain. Weight loss is of benefit. Handicap license is approved. Call if the pain recurrs and we would arrange for Dr. Cornelia Blas secretary/Assistant to call to arrange for epidural steroid injection  Stretching exercises for greater trochanteric bursitis

## 2019-10-12 ENCOUNTER — Other Ambulatory Visit: Payer: Self-pay | Admitting: Cardiology

## 2019-12-25 ENCOUNTER — Ambulatory Visit: Payer: Medicare Other | Admitting: Cardiology

## 2019-12-30 ENCOUNTER — Ambulatory Visit: Payer: Medicare PPO | Admitting: Cardiology

## 2019-12-30 ENCOUNTER — Other Ambulatory Visit: Payer: Self-pay

## 2019-12-30 ENCOUNTER — Encounter: Payer: Self-pay | Admitting: Cardiology

## 2019-12-30 VITALS — BP 143/83 | HR 54 | Ht 70.0 in | Wt 205.0 lb

## 2019-12-30 DIAGNOSIS — E7800 Pure hypercholesterolemia, unspecified: Secondary | ICD-10-CM

## 2019-12-30 DIAGNOSIS — Z955 Presence of coronary angioplasty implant and graft: Secondary | ICD-10-CM

## 2019-12-30 DIAGNOSIS — I119 Hypertensive heart disease without heart failure: Secondary | ICD-10-CM

## 2019-12-30 DIAGNOSIS — I2510 Atherosclerotic heart disease of native coronary artery without angina pectoris: Secondary | ICD-10-CM

## 2019-12-30 MED ORDER — ATENOLOL 50 MG PO TABS: 50.0000 mg | ORAL_TABLET | Freq: Every day | ORAL | 0 refills | Status: DC

## 2019-12-30 MED ORDER — LISINOPRIL 40 MG PO TABS: 40.0000 mg | ORAL_TABLET | Freq: Every morning | ORAL | 0 refills | Status: DC

## 2019-12-30 MED ORDER — FUROSEMIDE 20 MG PO TABS: 20.0000 mg | ORAL_TABLET | Freq: Every morning | ORAL | 0 refills | Status: DC

## 2019-12-30 MED ORDER — AMLODIPINE BESYLATE 5 MG PO TABS: 5.0000 mg | ORAL_TABLET | Freq: Every evening | ORAL | 0 refills | Status: DC

## 2019-12-30 NOTE — Progress Notes (Signed)
Brian Gallegos Date of Birth: 1945-08-27 MRN: [id number] Primary Care Provider:Miller, Lattie Haw, MD Former Cardiology Providers: Jeri Lager, APRN, FNP-C Primary Cardiologist: Rex Kras, DO, [facility] (established care 12/30/2019)  Date: 12/30/19 Last Visit: 05/27/2019  Chief Complaint  Patient presents with  . Coronary Artery Disease  . Follow-up  . Hypertension    HPI  Brian Gallegos is a 74 y.o.  male who presents to the office with a chief complaint of " blood pressure management." Patient's past medical history and cardiovascular risk factors include: Established coronary artery disease without angina pectoris, status post stenting in the RCA in April 2017, hypertension, hyperlipidemia, prediabetes, advanced age.  At last office visit patient was seen by Miquel Dunn for a 61-monthfollow-up given his established coronary artery disease and multiple cardiovascular risk factors. At that time patient was doing well without any angina or heart failure symptoms. He was asked to keep a log of his blood pressures at home and to call [facility] if they were greater than 140 mmHg. We emphasized the importance of working on his modifiable cardiovascular risk factors.  Since last office visit patient states that he is doing well overall.  Unfortunately, he recent lost his wife and put his dog to sleep as well. He denies chest pain at rest or with effort. He continues to play golf and enjoys doing it atleast once a week. With plans to increasing it to three times a week.  His home systolic blood pressures are usually in 130s and DBP are usually in the 80s.   No recent hospitalizations since the last office visit. He forgot to bring the [facility] that [facility]. He has an appointment coming up in 2 weeks.   ALLERGIES: Allergies  Allergen Reactions  . Codeine Itching    Low amount is tolerated   MEDICATION LIST PRIOR TO VISIT: Current Outpatient Medications on File Prior to  Visit  Medication Sig Dispense Refill  . amitriptyline (ELAVIL) 25 MG tablet Take 50 mg by mouth at bedtime.     .Marland Kitchenaspirin EC 81 MG tablet Take 81 mg by mouth daily.    .Marland Kitchenatorvastatin (LIPITOR) 80 MG tablet Take 80 mg by mouth daily.    . Cholecalciferol 50 MCG (2000 UT) CAPS Take 2,000 Units by mouth daily.     . cloNIDine (CATAPRES) 0.2 MG tablet Take 0.2 mg by mouth 2 (two) times daily.    . diclofenac (FLECTOR) 1.3 % PTCH     . diclofenac sodium (VOLTAREN) 1 % GEL Apply 4 g topically 4 (four) times daily. (Patient taking differently: Apply 4 g topically as needed. ) 5 Tube 1  . doxazosin (CARDURA) 8 MG tablet Take 8 mg by mouth daily.    .Marland Kitchengabapentin (NEURONTIN) 300 MG capsule TAKE 1 CAPSULE BY MOUTH THREE TIMES DAILY 270 capsule 0  . lubiprostone (AMITIZA) 8 MCG capsule Take 8 mcg by mouth 2 (two) times daily with a meal.    . methocarbamol (ROBAXIN) 750 MG tablet Take 750 mg by mouth daily.     . Oxycodone HCl 10 MG TABS Take 10 mg by mouth every 8 (eight) hours.     . potassium chloride (KLOR-CON) 10 MEQ tablet TAKE 1 TABLET BY MOUTH DAILY 30 tablet 6  . pyridoxine (B-6) 100 MG tablet Take 100 mg by mouth daily.    .Marland KitchenrOPINIRole (REQUIP) 0.5 MG tablet Take 0.5 mg by mouth 2 (two) times a day. Bedtime  and prn throughout the day    . thiamine (VITAMIN B-1) 100 MG tablet Take 100 mg by mouth daily.    . vitamin B-12 (CYANOCOBALAMIN) 1000 MCG tablet Take 1,000 mcg by mouth daily.      No current facility-administered medications on file prior to visit.    PAST MEDICAL HISTORY: Past Medical History:  Diagnosis Date  . Arthritis    "all over"  . Chronic lower back pain   . Early cataracts, bilateral   . Family history of adverse reaction to anesthesia    "son had ligament repair of elbow in 2016; woke up nauseated and disoriented"  . Hypercholesterolemia   . Hypertension   . Migraine    "none in years" (10/12/2015)  . Restless leg syndrome   . Wears glasses     PAST SURGICAL  HISTORY: Past Surgical History:  Procedure Laterality Date  . ANTERIOR CERVICAL DECOMP/DISCECTOMY FUSION  ~ 2005  . ANTERIOR CERVICAL DISCECTOMY    . BACK SURGERY    . CARDIAC CATHETERIZATION N/A 10/12/2015   Procedure: Left Heart Cath and Coronary Angiography;  Surgeon: Adrian Prows, MD;  Location: [place] [facility];  Service: Cardiovascular;  Laterality: N/A;  . CARDIAC CATHETERIZATION  10/12/2015   Procedure: Coronary Stent Intervention;  Surgeon: Adrian Prows, MD;  Location: [place] [facility];  Service: Cardiovascular;;  . COLONOSCOPY    . CORONARY ANGIOPLASTY  10/12/2015   DES RCA  . EYE SURGERY    . FRACTURE SURGERY    . GLAUCOMA SURGERY Left 03/2015  . JOINT REPLACEMENT    . KNEE ARTHROSCOPY Bilateral   . LUMBAR DISC SURGERY  ~ 1986-02/08/2007 X 6  . MAXIMUM ACCESS (MAS)POSTERIOR LUMBAR INTERBODY FUSION (PLIF) 3 LEVEL  02/08/2007   w/rods and screws  . SPINAL CORD STIMULATOR INSERTION N/A 05/08/2014   Procedure: LUMBAR SPINAL CORD STIMULATOR IMPLANT ;  Surgeon: Bonna Gains, MD;  Location: [facility];  Service: Neurosurgery;  Laterality: N/A;  . TOE FUSION Right 06/2014   "outside part of big toe"  . TOTAL KNEE ARTHROPLASTY Left ~ 2011    FAMILY HISTORY: The patient's family history includes Aneurysm in his mother; Cancer in an other family member; Heart attack in his father; Hypertension in his father.   SOCIAL HISTORY:  The patient  reports that he has never smoked. He has never used smokeless tobacco. He reports previous alcohol use. He reports that he does not use drugs.  Review of Systems  Constitutional: Negative for chills and fever.  HENT: Negative for hoarse voice and nosebleeds.   Eyes: Negative for discharge, double vision and pain.  Cardiovascular: Negative for chest pain, claudication, dyspnea on exertion, leg swelling, near-syncope, orthopnea, palpitations, paroxysmal nocturnal dyspnea and syncope.  Respiratory: Negative for hemoptysis and shortness of breath.     Musculoskeletal: Negative for muscle cramps and myalgias.  Gastrointestinal: Negative for abdominal pain, constipation, diarrhea, hematemesis, hematochezia, melena, nausea and vomiting.  Neurological: Negative for dizziness and light-headedness.    PHYSICAL EXAM: Vitals with BMI 12/30/2019 09/26/2019 05/27/2019  Height _0  _1  -  Weight 205 lbs 205 lbs -  BMI 70.26 37.85 -  Systolic 885 027 741  Diastolic 83 82 86  Pulse 54 52 -    CONSTITUTIONAL: Well-developed and well-nourished. No acute distress.  SKIN: Skin is warm and dry. No rash noted. No cyanosis. No pallor. No jaundice HEAD: Normocephalic and atraumatic.  EYES: No scleral icterus MOUTH/THROAT: Moist oral membranes.  NECK: No JVD  present. No thyromegaly noted. LYMPHATIC: No visible cervical adenopathy.  CHEST Normal respiratory effort. No intercostal retractions  LUNGS: Clear to auscultation bilaterally.  No stridor. No wheezes. No rales.  CARDIOVASCULAR: Regular rate and rhythm, positive S1-S2, no murmurs rubs or gallops appreciated. ABDOMINAL: Soft, nontender, nondistended, positive bowel sounds in all 4 quadrants.  No apparent ascites.  EXTREMITIES: No peripheral edema, warm to touch bilaterally.  With 2+ dorsalis pedis and posterior tibial pulses. HEMATOLOGIC: No significant bruising NEUROLOGIC: Oriented to person, place, and time. Nonfocal. Normal muscle tone.  PSYCHIATRIC: Normal mood and affect. Normal behavior. Cooperative  CARDIAC DATABASE: EKG: 05/27/2019: Sinus bradycardia at 58 bpm, normal axis, PRWP cannot exclude anterior infarct old. No evidence of ischemia.  12/30/2019: Sinus  Bradycardia, 54bpm, without underlying injury pattern.  Echocardiogram: 09/28/2016: Moderate to severe concentric LVH, Grade I DD, Calculated EF 50%. Moderate LA enlargement. Mild AR, Mild MR, Mild MAC.   Stress Testing:  Lexiscan myoview stress test 10/02/2016: SPECT images demonstrate Medium perfusion abnormality of mild  intensity in the basal inferior, mid inferior and apical inferior myocardial wall(s) on the stress images. The defect remains relatively unchanged between rest and stress images and is a soft tissue attenuation artifact. A small sized scar cannot be excluded. The left ventricular ejection fraction was calculated or visually estimated to be 31%. Study suggests dilated non ischemic cardiomyopathy (EDV 157cc).   Heart Catheterization: Coronary angiogram 10/12/2015: Normal LV systolic function. Moderate disease in the mid circumflex coronary artery of 50%. Diffuse coronary calcification involving the proximal LAD. Right coronary artery very large and dominant. Proximal segment has ulcerated 90% stenosis. Mild amount of calcification is also evident. Stenting proximal large dominant RCA for an ulcerated 90% stenosis to 0% with implantation of a 4.0 x 18 mm resolute DES.  Vascular imaging: Abdominal aortic duplex 05/30/2017: Focal plaque noted in the proximal, mid and distal aorta. The maximum aorta diameter is 2.82 cm (dist). No AAA. Normal iliac velocity.  LABORATORY DATA: CBC Latest Ref Rng & Units 04/29/2014 04/22/2010 04/21/2010  WBC 4.0 - 10.5 K/uL 6.5 13.3(H) 17.7(H)  Hemoglobin 13.0 - 17.0 g/dL 13.0 9.7(L) 10.5(L)  Hematocrit 39 - 52 % 38.4(L) 29.5(L) 32.0(L)  Platelets 150 - 400 K/uL 210 146(L) 152    CMP Latest Ref Rng & Units 04/29/2014 04/20/2010 04/15/2010  Glucose 70 - 99 mg/dL 106(H) 159(H) 109(H)  BUN 6 - 23 mg/dL _0 Creatinine 0.50 - 1.35 mg/dL 0.92 1.02 0.91  Sodium 137 - 147 mEq/L 138 137 141  Potassium 3.7 - 5.3 mEq/L 4.1 3.9 4.0  Chloride 96 - 112 mEq/L 99 107 107  CO2 19 - 32 mEq/L _1 Calcium 8.4 - 10.5 mg/dL 9.3 7.8(L) 9.0  Total Protein 6.0 - 8.3 g/dL - - 6.3  Total Bilirubin 0.3 - 1.2 mg/dL - - 0.8  Alkaline Phos 39 - 117 U/L - - 102  AST 0 - 37 U/L - - 24  ALT 0 - 53 U/L - - 21    Lipid Panel  No results found for: CHOL, TRIG, HDL, CHOLHDL,  VLDL, LDLCALC, LDLDIRECT, LABVLDL  Lab Results  Component Value Date   HGBA1C  04/20/2010    5.6 (NOTE)  According to the ADA Clinical Practice Recommendations for 2011, when HbA1c is used as a screening test:   >=6.5%   Diagnostic of Diabetes Mellitus           (if abnormal result  is confirmed)  5.7-6.4%   Increased risk of developing Diabetes Mellitus  References:Diagnosis and Classification of Diabetes Mellitus,Diabetes SMOL,0786,75(QGBEE 1):S62-S69 and Standards of Medical Care in         Diabetes - 2011,Diabetes FEOF,1219,75  (Suppl 1):S11-S61.   No components found for: NTPROBNP No results found for: TSH  Cardiac Panel (last 3 results) No results for input(s): CKTOTAL, CKMB, TROPONINIHS, RELINDX in the last 72 hours.  IMPRESSION:    ICD-10-CM   1. Atherosclerosis of native coronary artery of native heart without angina pectoris  I25.10 EKG 12-Lead    PCV MYOCARDIAL PERFUSION WITH LEXISCAN    PCV ECHOCARDIOGRAM COMPLETE  2. H/O heart artery stent  Z95.5 PCV MYOCARDIAL PERFUSION WITH LEXISCAN    PCV ECHOCARDIOGRAM COMPLETE  3. Hypertension with heart disease  I11.9 furosemide (LASIX) 20 MG tablet    lisinopril (ZESTRIL) 40 MG tablet    amLODipine (NORVASC) 5 MG tablet    atenolol (TENORMIN) 50 MG tablet    Basic metabolic panel    Magnesium  4. Hypercholesterolemia  E78.00      RECOMMENDATIONS: Brian Gallegos is a 74 y.o. male whose past medical history and cardiovascular risk factors include: Established coronary artery disease without angina pectoris, status post stenting in the RCA in April 2017, hypertension, hyperlipidemia, prediabetes, advanced age.  Established coronary artery disease without angina pectoris and prior PCI:  Patient's last ischemic evaluation was approximately 2017/2018.  Currently does not have any chest pain at rest or with effort related activities.  However, patient is  concerned about his erectile dysfunction and is requesting ED medications.  Recommend ischemic evaluation to rule out progression of CAD and to establish care with urology to rule out organic causes prior to considering pharmacological therapy.  Benign essential hypertension:  Patient's blood pressure is not well controlled and is currently bradycardic.  He is on multiple medications that are not optimized.  Patient is requesting that his medications be titrated.  Decrease Lasix to 20 mg p.o. every morning.  Decrease lisinopril to 40 mg p.o. every morning.  Decrease atenolol to 50 mg p.o. daily.  Increase amlodipine to 5 mg p.o. every p.m.  Ideally would also like to wean off clonidine at later office visits.  Blood work in 1 week to evaluate kidney function and electrolytes.  Recommend a low-salt diet.  Patient is asked to keep a log of his blood pressures and call the office if he has systolic blood pressure greater than 140 mmHg consistently.  Hypercholesterolemia: . Continue statin therapy.   . Follow lipids. . Currently managed by primary care provider. . Patient denies myalgia or other side effects.   FINAL MEDICATION LIST END OF ENCOUNTER: Meds ordered this encounter  Medications  . furosemide (LASIX) 20 MG tablet    Sig: Take 1 tablet (20 mg total) by mouth in the morning.    Dispense:  90 tablet    Refill:  0  . lisinopril (ZESTRIL) 40 MG tablet    Sig: Take 1 tablet (40 mg total) by mouth in the morning.    Dispense:  90 tablet    Refill:  0  . amLODipine (NORVASC) 5 MG tablet    Sig: Take 1 tablet (5 mg total) by mouth every evening.  Dispense:  90 tablet    Refill:  0  . atenolol (TENORMIN) 50 MG tablet    Sig: Take 1 tablet (50 mg total) by mouth daily for 90 doses. Hold if systolic blood pressure (top blood pressure number) less than 100 mmHg or heart rate less than 60 bpm (pulse).    Dispense:  90 tablet    Refill:  0    Medications Discontinued  During This Encounter  Medication Reason  . furosemide (LASIX) 40 MG tablet Dose change  . lisinopril (PRINIVIL,ZESTRIL) 40 MG tablet Dose change  . amLODipine (NORVASC) 5 MG tablet Dose change  . atenolol (TENORMIN) 50 MG tablet Dose change     Current Outpatient Medications:  .  amitriptyline (ELAVIL) 25 MG tablet, Take 50 mg by mouth at bedtime. , Disp: , Rfl:  .  aspirin EC 81 MG tablet, Take 81 mg by mouth daily., Disp: , Rfl:  .  atorvastatin (LIPITOR) 80 MG tablet, Take 80 mg by mouth daily., Disp: , Rfl:  .  Cholecalciferol 50 MCG (2000 UT) CAPS, Take 2,000 Units by mouth daily. , Disp: , Rfl:  .  cloNIDine (CATAPRES) 0.2 MG tablet, Take 0.2 mg by mouth 2 (two) times daily., Disp: , Rfl:  .  diclofenac (FLECTOR) 1.3 % PTCH, , Disp: , Rfl:  .  diclofenac sodium (VOLTAREN) 1 % GEL, Apply 4 g topically 4 (four) times daily. (Patient taking differently: Apply 4 g topically as needed. ), Disp: 5 Tube, Rfl: 1 .  doxazosin (CARDURA) 8 MG tablet, Take 8 mg by mouth daily., Disp: , Rfl:  .  gabapentin (NEURONTIN) 300 MG capsule, TAKE 1 CAPSULE BY MOUTH THREE TIMES DAILY, Disp: 270 capsule, Rfl: 0 .  lubiprostone (AMITIZA) 8 MCG capsule, Take 8 mcg by mouth 2 (two) times daily with a meal., Disp: , Rfl:  .  methocarbamol (ROBAXIN) 750 MG tablet, Take 750 mg by mouth daily. , Disp: , Rfl:  .  Oxycodone HCl 10 MG TABS, Take 10 mg by mouth every 8 (eight) hours. , Disp: , Rfl:  .  potassium chloride (KLOR-CON) 10 MEQ tablet, TAKE 1 TABLET BY MOUTH DAILY, Disp: 30 tablet, Rfl: 6 .  pyridoxine (B-6) 100 MG tablet, Take 100 mg by mouth daily., Disp: , Rfl:  .  rOPINIRole (REQUIP) 0.5 MG tablet, Take 0.5 mg by mouth 2 (two) times a day. Bedtime and prn throughout the day, Disp: , Rfl:  .  thiamine (VITAMIN B-1) 100 MG tablet, Take 100 mg by mouth daily., Disp: , Rfl:  .  vitamin B-12 (CYANOCOBALAMIN) 1000 MCG tablet, Take 1,000 mcg by mouth daily. , Disp: , Rfl:  .  amLODipine (NORVASC) 5 MG tablet,  Take 1 tablet (5 mg total) by mouth every evening., Disp: 90 tablet, Rfl: 0 .  atenolol (TENORMIN) 50 MG tablet, Take 1 tablet (50 mg total) by mouth daily for 90 doses. Hold if systolic blood pressure (top blood pressure number) less than 100 mmHg or heart rate less than 60 bpm (pulse)., Disp: 90 tablet, Rfl: 0 .  furosemide (LASIX) 20 MG tablet, Take 1 tablet (20 mg total) by mouth in the morning., Disp: 90 tablet, Rfl: 0 .  lisinopril (ZESTRIL) 40 MG tablet, Take 1 tablet (40 mg total) by mouth in the morning., Disp: 90 tablet, Rfl: 0  Orders Placed This Encounter  Procedures  . Basic metabolic panel  . Magnesium  . PCV MYOCARDIAL PERFUSION WITH LEXISCAN  . EKG 12-Lead  . PCV  ECHOCARDIOGRAM COMPLETE   --Continue cardiac medications as reconciled in final medication list. --Return in about 4 weeks (around 01/27/2020) for BP follow up., review test results.. Or sooner if needed. --Continue follow-up with your primary care physician regarding the management of your other chronic comorbid conditions.  Patient's questions and concerns were addressed to his satisfaction. He voices understanding of the instructions provided during this encounter.   Time spent: 40 minutes.  This note was created using a voice recognition software as a result there may be grammatical errors inadvertently enclosed that do not reflect the nature of this encounter. Every attempt is made to correct such errors.  Rex Kras, [place], [facility]  Pager: [phone] Office: [phone]

## 2020-01-02 ENCOUNTER — Other Ambulatory Visit: Payer: Medicare PPO

## 2020-01-06 ENCOUNTER — Other Ambulatory Visit: Payer: Self-pay | Admitting: Cardiology

## 2020-01-07 LAB — BASIC METABOLIC PANEL
BUN/Creatinine Ratio: 14 (ref 10–24)
BUN: 11 mg/dL (ref 8–27)
CO2: 22 mmol/L (ref 20–29)
Calcium: 9.7 mg/dL (ref 8.6–10.2)
Chloride: 99 mmol/L (ref 96–106)
Creatinine, Ser: 0.8 mg/dL (ref 0.76–1.27)
GFR calc Af Amer: 102 mL/min/{1.73_m2} (ref >59)
GFR calc non Af Amer: 89 mL/min/{1.73_m2} (ref >59)
Glucose: 106 mg/dL — ABNORMAL HIGH (ref 65–99)
Potassium: 3.9 mmol/L (ref 3.5–5.2)
Sodium: 137 mmol/L (ref 134–144)

## 2020-01-07 LAB — MAGNESIUM: Magnesium: 2 mg/dL (ref 1.6–2.3)

## 2020-01-09 ENCOUNTER — Telehealth: Payer: Self-pay

## 2020-01-09 NOTE — Telephone Encounter (Signed)
-----   Message from [place], [place] sent at 01/08/2020  6:41 PM EDT ----- Results reviewed.Serum potassium and magnesium levels are within normal limits.Kidney function is relatively at baseline. Please ask the patient how his blood pressures are running at home.Continue current medical therapy.Call if questions arise.

## 2020-01-09 NOTE — Telephone Encounter (Signed)
Patient will call back

## 2020-01-09 NOTE — Progress Notes (Signed)
Called and spoke with patient regarding his lab results. Patient has not been checking his BP at home because he is in the process of moving, and his machine is out of date anyway, so I transferred call to [facility] about getting a replacement. Patient aware to continue current medical therapy and call if he has questions.  Associated with: BASIC METABOLIC

## 2020-01-09 NOTE — Progress Notes (Signed)
Called and spoke with patient regarding his lab results. Patient has not been checking his BP at home because he is in the process of moving, and his machine is out of date anyway, so I transferred call to [facility] about getting a replacement. Patient aware to continue current medical therapy and call if he has questions.

## 2020-01-13 ENCOUNTER — Other Ambulatory Visit: Payer: Self-pay

## 2020-01-13 DIAGNOSIS — I119 Hypertensive heart disease without heart failure: Secondary | ICD-10-CM

## 2020-01-19 ENCOUNTER — Other Ambulatory Visit: Payer: Self-pay

## 2020-01-19 ENCOUNTER — Ambulatory Visit: Payer: Medicare PPO

## 2020-01-19 DIAGNOSIS — I2510 Atherosclerotic heart disease of native coronary artery without angina pectoris: Secondary | ICD-10-CM

## 2020-01-19 DIAGNOSIS — Z955 Presence of coronary angioplasty implant and graft: Secondary | ICD-10-CM

## 2020-01-29 ENCOUNTER — Encounter: Payer: Self-pay | Admitting: Cardiology

## 2020-01-29 ENCOUNTER — Ambulatory Visit: Payer: Medicare PPO | Admitting: Cardiology

## 2020-01-29 ENCOUNTER — Other Ambulatory Visit: Payer: Self-pay

## 2020-01-29 VITALS — BP 128/76 | HR 64 | Resp 17 | Ht 70.0 in | Wt 205.0 lb

## 2020-01-29 DIAGNOSIS — I2510 Atherosclerotic heart disease of native coronary artery without angina pectoris: Secondary | ICD-10-CM

## 2020-01-29 DIAGNOSIS — Z712 Person consulting for explanation of examination or test findings: Secondary | ICD-10-CM

## 2020-01-29 DIAGNOSIS — I119 Hypertensive heart disease without heart failure: Secondary | ICD-10-CM

## 2020-01-29 DIAGNOSIS — E7800 Pure hypercholesterolemia, unspecified: Secondary | ICD-10-CM

## 2020-01-29 DIAGNOSIS — Z955 Presence of coronary angioplasty implant and graft: Secondary | ICD-10-CM

## 2020-01-29 NOTE — Progress Notes (Signed)
Brian Gallegos Date of Birth: 08-05-1945 MRN: [id number] Primary Care Provider:Miller, Misty Stanley, MD Former Cardiology Providers: Altamese [place], APRN, FNP-C Primary Cardiologist: Tessa Lerner, DO, [facility] (established care 12/30/2019)  Date: 01/29/20 Last Office Visit: 12/30/2019  Chief Complaint  Patient presents with   Hypertension   Coronary Artery Disease   Follow-up    4week   Results    HPI  Brian Gallegos is a 74 y.o.  male who presents to the office with a chief complaint of " blood pressure management and follow-up on test results." Patient's past medical history and cardiovascular risk factors include: Established coronary artery disease without angina pectoris, status post stenting in the RCA in April 2017, hypertension, hyperlipidemia, prediabetes, advanced age.  Patient was originally under care of Altamese Otho, APRN, FNP-C and reestablish care with myself back in June 2021.  At the last office visit we changed his antihypertensive medications and since then patient states that his blood pressures at home are fairly well controlled.  His systolic blood pressures range between 120-140 mmHg and diastolic blood pressures are usually around 70 mmHg.    Patient states that he is currently in the process of moving and has been physically active without any symptoms of effort related chest pain or shortness of breath.  At last office visit patient was planning to start erectile dysfunction medication and wanted a cardiovascular evaluation prior to doing so.  Patient underwent an echocardiogram which noted mildly reduced left ventricular systolic function with global hypokinesis and mild to moderate valvular heart disease.  These findings were discussed with him in great detail at today's office visit.  His nuclear stress test was noted to be intermediate risk due to reduced LVEF as per gated SPECT of 44%.  Overall the myocardial perfusion was reported to be  normal.  ALLERGIES: Allergies  Allergen Reactions   Codeine Itching    Low amount is tolerated   MEDICATION LIST PRIOR TO VISIT: Current Outpatient Medications on File Prior to Visit  Medication Sig Dispense Refill   amitriptyline (ELAVIL) 25 MG tablet Take 50 mg by mouth at bedtime.      amLODipine (NORVASC) 5 MG tablet Take 1 tablet (5 mg total) by mouth every evening. 90 tablet 0   aspirin EC 81 MG tablet Take 81 mg by mouth daily.     atenolol (TENORMIN) 50 MG tablet Take 1 tablet (50 mg total) by mouth daily for 90 doses. Hold if systolic blood pressure (top blood pressure number) less than 100 mmHg or heart rate less than 60 bpm (pulse). 90 tablet 0   atorvastatin (LIPITOR) 80 MG tablet Take 80 mg by mouth daily.     Cholecalciferol 50 MCG (2000 UT) CAPS Take 2,000 Units by mouth daily.      cloNIDine (CATAPRES) 0.2 MG tablet Take 0.2 mg by mouth 2 (two) times daily.     diclofenac (FLECTOR) 1.3 % PTCH      diclofenac sodium (VOLTAREN) 1 % GEL Apply 4 g topically 4 (four) times daily. (Patient taking differently: Apply 4 g topically as needed. ) 5 Tube 1   doxazosin (CARDURA) 8 MG tablet Take 8 mg by mouth daily.     furosemide (LASIX) 20 MG tablet Take 1 tablet (20 mg total) by mouth in the morning. 90 tablet 0   gabapentin (NEURONTIN) 300 MG capsule TAKE 1 CAPSULE BY MOUTH THREE TIMES DAILY 270 capsule 0   lisinopril (ZESTRIL) 40 MG tablet Take 1 tablet (40 mg total)  by mouth in the morning. 90 tablet 0   lubiprostone (AMITIZA) 8 MCG capsule Take 8 mcg by mouth 2 (two) times daily with a meal.     methocarbamol (ROBAXIN) 750 MG tablet Take 750 mg by mouth daily.      Oxycodone HCl 10 MG TABS Take 10 mg by mouth every 8 (eight) hours.      potassium chloride (KLOR-CON) 10 MEQ tablet TAKE 1 TABLET BY MOUTH DAILY 30 tablet 6   pyridoxine (B-6) 100 MG tablet Take 100 mg by mouth daily.     rOPINIRole (REQUIP) 0.5 MG tablet Take 0.5 mg by mouth 2 (two) times a day.  Bedtime and prn throughout the day     thiamine (VITAMIN B-1) 100 MG tablet Take 100 mg by mouth daily.     vitamin B-12 (CYANOCOBALAMIN) 1000 MCG tablet Take 1,000 mcg by mouth daily.      No current facility-administered medications on file prior to visit.    PAST MEDICAL HISTORY: Past Medical History:  Diagnosis Date   Arthritis    "all over"   Chronic lower back pain    Early cataracts, bilateral    Family history of adverse reaction to anesthesia    "son had ligament repair of elbow in 2016; woke up nauseated and disoriented"   Hypercholesterolemia    Hypertension    Migraine    "none in years" (10/12/2015)   Restless leg syndrome    Wears glasses     PAST SURGICAL HISTORY: Past Surgical History:  Procedure Laterality Date   ANTERIOR CERVICAL DECOMP/DISCECTOMY FUSION  ~ 2005   ANTERIOR CERVICAL DISCECTOMY     BACK SURGERY     CARDIAC CATHETERIZATION N/A 10/12/2015   Procedure: Left Heart Cath and Coronary Angiography;  Surgeon: Yates Decamp, MD;  [facility] INVASIVE CV LAB;  Service: Cardiovascular;  Laterality: N/A;   CARDIAC CATHETERIZATION  10/12/2015   Procedure: Coronary Stent Intervention;  Surgeon: Yates Decamp, MD;  Location: [facility] INVASIVE CV LAB;  Service: Cardiovascular;;   COLONOSCOPY     CORONARY ANGIOPLASTY  10/12/2015   DES RCA   EYE SURGERY     FRACTURE SURGERY     GLAUCOMA SURGERY Left 03/2015   JOINT REPLACEMENT     KNEE ARTHROSCOPY Bilateral    LUMBAR DISC SURGERY  ~ 1986-02/08/2007 X 6   MAXIMUM ACCESS (MAS)POSTERIOR LUMBAR INTERBODY FUSION (PLIF) 3 LEVEL  02/08/2007   w/rods and screws   SPINAL CORD STIMULATOR INSERTION N/A 05/08/2014   Procedure: LUMBAR SPINAL CORD STIMULATOR IMPLANT ;  Surgeon: Gwynne Edinger, MD;  Location: [facility] NEURO ORS;  Service: Neurosurgery;  Laterality: N/A;   TOE FUSION Right 06/2014   "outside part of big toe"   TOTAL KNEE ARTHROPLASTY Left ~ 2011    FAMILY HISTORY: The patient's family history  includes Aneurysm in his mother; Cancer in an other family member; Heart attack in his father; Hypertension in his father.   SOCIAL HISTORY:  The patient  reports that he has never smoked. He has never used smokeless tobacco. He reports previous alcohol use. He reports that he does not use drugs.  Review of Systems  Constitutional: Negative for chills and fever.  HENT: Negative for hoarse voice and nosebleeds.   Eyes: Negative for discharge, double vision and pain.  Cardiovascular: Negative for chest pain, claudication, dyspnea on exertion, leg swelling, near-syncope, orthopnea, palpitations, paroxysmal nocturnal dyspnea and syncope.  Respiratory: Negative for hemoptysis and shortness of breath.   Musculoskeletal: Negative for  muscle cramps and myalgias.  Gastrointestinal: Negative for abdominal pain, constipation, diarrhea, hematemesis, hematochezia, melena, nausea and vomiting.  Neurological: Negative for dizziness and light-headedness.    PHYSICAL EXAM: Vitals with BMI 01/29/2020 12/30/2019 09/26/2019  Height 5\' 10"  5\' 10"  5\' 10"   Weight 205 lbs 205 lbs 205 lbs  BMI 29.41 29.41 29.41  Systolic 128 143  Diastolic 76 83 82  Pulse 64 54 52    CONSTITUTIONAL: Well-developed and well-nourished. No acute distress.  SKIN: Skin is warm and dry. No rash noted. No cyanosis. No pallor. No jaundice HEAD: Normocephalic and atraumatic.  EYES: No scleral icterus MOUTH/THROAT: Moist oral membranes.  NECK: No JVD present. No thyromegaly noted. LYMPHATIC: No visible cervical adenopathy.  CHEST Normal respiratory effort. No intercostal retractions  LUNGS: Clear to auscultation bilaterally.  No stridor. No wheezes. No rales.  CARDIOVASCULAR: Regular rate and rhythm, positive S1-S2, no murmurs rubs or gallops appreciated. ABDOMINAL: Soft, nontender, nondistended, positive bowel sounds in all 4 quadrants.  No apparent ascites.  EXTREMITIES: No peripheral edema, warm to touch bilaterally.  With 2+  dorsalis pedis and posterior tibial pulses. HEMATOLOGIC: No significant bruising NEUROLOGIC: Oriented to person, place, and time. Nonfocal. Normal muscle tone.  PSYCHIATRIC: Normal mood and affect. Normal behavior. Cooperative  CARDIAC DATABASE: EKG: 05/27/2019: Sinus bradycardia at 58 bpm, normal axis, PRWP cannot exclude anterior infarct old. No evidence of ischemia.  12/30/2019: Sinus  Bradycardia, 54bpm, without underlying injury pattern.  Echocardiogram: 01/19/2020: LVEF 45-50%, mild global hypokinesis, moderate LVH, grade 1 diastolic impairment, normal LAP, moderately dilated left atrium, mild AR, moderate MR.  Stress Testing:  Lexiscan/modified Bruce Tetrofosmin stress test 01/19/2020:  Lexiscan/modified Bruce nuclear stress test performed using 1-day protocol.  Stress EKG is non-diagnostic, as this is pharmacological stress test. 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  Normal myocardial perfusion. Stress LVEF calculated 44%, although visually appears normal.  Intermediate risk study due to reduced LVEF. Recommend clinical correlation.   Heart Catheterization: Coronary angiogram 10/12/2015: Normal LV systolic function. Moderate disease in the mid circumflex coronary artery of 50%. Diffuse coronary calcification involving the proximal LAD. Right coronary artery very large and dominant. Proximal segment has ulcerated 90% stenosis. Mild amount of calcification is also evident. Stenting proximal large dominant RCA for an ulcerated 90% stenosis to 0% with implantation of a 4.0 x 18 mm resolute DES.  Vascular imaging: Abdominal aortic duplex 05/30/2017: Focal plaque noted in the proximal, mid and distal aorta. The maximum aorta diameter is 2.82 cm (dist). No AAA. Normal iliac velocity.  LABORATORY DATA: CBC Latest Ref Rng & Units 04/29/2014 04/22/2010 04/21/2010  WBC 4.0 - 10.5 K/uL 6.5 13.3(H) 17.7(H)  Hemoglobin  13.0 - 17.0 g/dL 05/01/2014 04/24/2010) 10.5(L)  Hematocrit 39 - 52 % 38.4(L) 29.5(L) 32.0(L)  Platelets 150 - 400 K/uL 210 146(L) 152    CMP Latest Ref Rng & Units 01/06/2020 04/29/2014 04/20/2010  Glucose 65 - 99 mg/dL 03/08/2020) 05/01/2014) 04/22/2010)  BUN 8 - 27 mg/dL 11 10 11   Creatinine 0.76 - 1.27 mg/dL 308(M 578(I 696(E  Sodium 134 - 144 mmol/L 137 138 137  Potassium 3.5 - 5.2 mmol/L 3.9 4.1 3.9  Chloride 96 - 106 mmol/L 99 99 107  CO2 20 - 29 mmol/L 22 27 26   Calcium 8.6 - 10.2 mg/dL 9.7 9.3 )  Total Protein 6.0 - 8.3 g/dL - - -  Total Bilirubin 0.3 - 1.2 mg/dL - - -  Alkaline Phos  39 - 117 U/L - - -  AST 0 - 37 U/L - - -  ALT 0 - 53 U/L - - -    Lipid Panel  No results found for: CHOL, TRIG, HDL, CHOLHDL, VLDL, LDLCALC, LDLDIRECT, LABVLDL  Lab Results  Component Value Date   HGBA1C  04/20/2010    5.6 (NOTE)                                                                       According to the ADA Clinical Practice Recommendations for 2011, when HbA1c is used as a screening test:   >=6.5%   Diagnostic of Diabetes Mellitus           (if abnormal result  is confirmed)  5.7-6.4%   Increased risk of developing Diabetes Mellitus  References:Diagnosis and Classification of Diabetes Mellitus,Diabetes Care,2011,34(Suppl 1):S62-S69 and Standards of Medical Care in         Diabetes - 2011,Diabetes Care,2011,34  (Suppl 1):S11-S61.   No components found for: NTPROBNP No results found for: TSH  Cardiac Panel (last 3 results) No results for input(s): CKTOTAL, CKMB, TROPONINIHS, RELINDX in the last 72 hours.  IMPRESSION:    ICD-10-CM   1. Atherosclerosis of native coronary artery of native heart without angina pectoris  I25.10   2. History of coronary angioplasty with insertion of stent  Z95.5   3. Hypertension with heart disease  I11.9   4. Hypercholesterolemia  E78.00   5. Encounter to discuss test results  Z71.2      RECOMMENDATIONS: Brian Gallegos is a 74 y.o. male whose past medical  history and cardiovascular risk factors include: Established coronary artery disease without angina pectoris, status post stenting in the RCA in April 2017, hypertension, hyperlipidemia, prediabetes, advanced age.  Established coronary artery disease without angina pectoris and prior PCI:  Was also most recent ischemic evaluation including an echocardiogram and nuclear stress test were discussed in great detail at today's office visit.  Patient remains asymptomatic with regard to anginal symptoms.  Medications reconciled.  Patient remains to have appropriate functional capacity for age without change in endurance or effort related symptoms of chest pain or shortness of breath.  Shared decision was to proceed with current medical therapy and if symptoms surface consider left heart catheterization.    Patient is still recommended to follow-up with urology for her ruling out organic causes of his underlying ED symptoms.  Benign essential hypertension:  Patient's blood pressure medications were changed back in June 2021 as he was having high blood pressures with bradycardic episodes.  Since the titration of his antihypertensive medications his blood pressures have improved.  His office blood pressures are at goal.    Continue current medical therapy.    Independently reviewed the most recent blood work with the patient at today's office visit.  Patient is not sure if he is currently on clonidine.  We discussed possibly weaning him off of clonidine and further uptitrating his guideline directed medical therapy.  Patient states that he will call us back to see if he is taking clonidine on a regular basis.    He is encouraged to bring his medication bottles at every office visit.    Recommend a low-salt diet.  Patient is asked to keep a log of his blood pressures and call the office if he has systolic blood pressure greater than 140 mmHg consistently.  Hypercholesterolemia:  Continue statin  therapy.    Follow lipids.  Currently managed by primary care provider.  Patient denies myalgia or other side effects.  FINAL MEDICATION LIST END OF ENCOUNTER: No orders of the defined types were placed in this encounter.   There are no discontinued medications.   Current Outpatient Medications:    amitriptyline (ELAVIL) 25 MG tablet, Take 50 mg by mouth at bedtime. , Disp: , Rfl:    amLODipine (NORVASC) 5 MG tablet, Take 1 tablet (5 mg total) by mouth every evening., Disp: 90 tablet, Rfl: 0   aspirin EC 81 MG tablet, Take 81 mg by mouth daily., Disp: , Rfl:    atenolol (TENORMIN) 50 MG tablet, Take 1 tablet (50 mg total) by mouth daily for 90 doses. Hold if systolic blood pressure (top blood pressure number) less than 100 mmHg or heart rate less than 60 bpm (pulse)., Disp: 90 tablet, Rfl: 0   atorvastatin (LIPITOR) 80 MG tablet, Take 80 mg by mouth daily., Disp: , Rfl:    Cholecalciferol 50 MCG (2000 UT) CAPS, Take 2,000 Units by mouth daily. , Disp: , Rfl:    cloNIDine (CATAPRES) 0.2 MG tablet, Take 0.2 mg by mouth 2 (two) times daily., Disp: , Rfl:    diclofenac (FLECTOR) 1.3 % PTCH, , Disp: , Rfl:    diclofenac sodium (VOLTAREN) 1 % GEL, Apply 4 g topically 4 (four) times daily. (Patient taking differently: Apply 4 g topically as needed. ), Disp: 5 Tube, Rfl: 1   doxazosin (CARDURA) 8 MG tablet, Take 8 mg by mouth daily., Disp: , Rfl:    furosemide (LASIX) 20 MG tablet, Take 1 tablet (20 mg total) by mouth in the morning., Disp: 90 tablet, Rfl: 0   gabapentin (NEURONTIN) 300 MG capsule, TAKE 1 CAPSULE BY MOUTH THREE TIMES DAILY, Disp: 270 capsule, Rfl: 0   lisinopril (ZESTRIL) 40 MG tablet, Take 1 tablet (40 mg total) by mouth in the morning., Disp: 90 tablet, Rfl: 0   lubiprostone (AMITIZA) 8 MCG capsule, Take 8 mcg by mouth 2 (two) times daily with a meal., Disp: , Rfl:    methocarbamol (ROBAXIN) 750 MG tablet, Take 750 mg by mouth daily. , Disp: , Rfl:    Oxycodone  HCl 10 MG TABS, Take 10 mg by mouth every 8 (eight) hours. , Disp: , Rfl:    potassium chloride (KLOR-CON) 10 MEQ tablet, TAKE 1 TABLET BY MOUTH DAILY, Disp: 30 tablet, Rfl: 6   pyridoxine (B-6) 100 MG tablet, Take 100 mg by mouth daily., Disp: , Rfl:    rOPINIRole (REQUIP) 0.5 MG tablet, Take 0.5 mg by mouth 2 (two) times a day. Bedtime and prn throughout the day, Disp: , Rfl:    thiamine (VITAMIN B-1) 100 MG tablet, Take 100 mg by mouth daily., Disp: , Rfl:    vitamin B-12 (CYANOCOBALAMIN) 1000 MCG tablet, Take 1,000 mcg by mouth daily. , Disp: , Rfl:   No orders of the defined types were placed in this encounter.  --Continue cardiac medications as reconciled in final medication list. --Return in about 3 months (around 04/30/2020) for CAD re-evaluation. . Or sooner if needed. --Continue follow-up with your primary care physician regarding the management of your other chronic comorbid conditions.  Patient's questions and concerns were addressed to his satisfaction. He voices understanding of the  instructions provided during this encounter.   This note was created using a voice recognition software as a result there may be grammatical errors inadvertently enclosed that do not reflect the nature of this encounter. Every attempt is made to correct such errors.  Total time spent: 30 minutes.  Tessa Lerner, [place], [facility]  Pager: [phone] Office: [phone]

## 2020-02-26 ENCOUNTER — Ambulatory Visit: Payer: Self-pay

## 2020-02-26 ENCOUNTER — Ambulatory Visit: Payer: Medicare PPO | Admitting: Surgery

## 2020-02-26 DIAGNOSIS — M960 Pseudarthrosis after fusion or arthrodesis: Secondary | ICD-10-CM | POA: Diagnosis not present

## 2020-02-26 DIAGNOSIS — M6281 Muscle weakness (generalized): Secondary | ICD-10-CM

## 2020-02-26 DIAGNOSIS — M542 Cervicalgia: Secondary | ICD-10-CM | POA: Diagnosis not present

## 2020-02-26 DIAGNOSIS — M4322 Fusion of spine, cervical region: Secondary | ICD-10-CM | POA: Diagnosis not present

## 2020-02-26 DIAGNOSIS — M5412 Radiculopathy, cervical region: Secondary | ICD-10-CM

## 2020-02-26 MED ORDER — METHYLPREDNISOLONE ACETATE 80 MG/ML IJ SUSP: 80.0000 mg | Freq: Once | INTRAMUSCULAR | Status: DC

## 2020-02-26 MED ORDER — METHYLPREDNISOLONE 4 MG PO TABS: ORAL_TABLET | ORAL | 0 refills | Status: DC

## 2020-02-26 NOTE — Progress Notes (Signed)
Office Visit Note   Patient: Brian Gallegos           Date of Birth: 1946-03-13           MRN: [id number] Visit Date: 02/26/2020              Requested by: Sigmund Hazel, MD [address] [place],  [place] [address] PCP: Sigmund Hazel, MD   Assessment & Plan: Visit Diagnoses:  1. Neck pain   2. Cervical vertebral fusion   3. Muscle weakness of left arm   4. Pseudarthrosis after fusion or arthrodesis   5. Radiculopathy, cervical region     Plan: With patient's worsening symptoms since previous CT scan done May 2020 recommend repeating the study to see if there has been significant changes.  As previously documented patient does have a C6-7 pseudoarthrosis.  Follow-up with Dr. Otelia Sergeant after completion to discuss results and further treatment options.  In hopes of at least giving him some improvement he was given a Depo-Medrol 80 mg IM injection today and he will start Medrol Dosepak 6-day taper to be taken as directed tomorrow.  Follow-Up Instructions: Return in about 4 weeks (around 03/25/2020) for Per Fayrene Fearing with Dr. Otelia Sergeant to review new CT cervical spine and compare to study that was done May 2020.   Orders:  Orders Placed This Encounter  Procedures  . XR Cervical Spine 2 or 3 views  . CT CERVICAL SPINE W CONTRAST   Meds ordered this encounter  Medications  . methylPREDNISolone acetate (DEPO-MEDROL) injection 80 mg  . methylPREDNISolone (MEDROL) 4 MG tablet    Sig: 6-day taper to be taken as directed.  Start this medication February 27, 2020    Dispense:  21 tablet    Refill:  0      Procedures: No procedures performed   Clinical Data: No additional findings.   Subjective: Chief Complaint  Patient presents with  . Neck - Pain    HPI 74 year old white male comes in today with complaints of worsening neck pain, new right upper extremity radiculopathy x2 months and chronic left arm weakness.  Patient was last seen by Dr. Otelia Sergeant for this problem September 05, 2018 and CT  cervical spine was performed.  That study showed:  CT CERVICAL MYELOGRAM FINDINGS:  There is reversal of the normal cervical lordosis. Fused anterolisthesis of C4 on C5 measures 3 mm. Fused retrolisthesis of C5 on C6 measures 3 mm. Remote, solid interbody osseous fusion is again noted at C4-5 and C5-6 with facet ankylosis at these levels as well. Prior C6-7 ACDF is unchanged with chronic lucency through the disc space and sclerosis of the endplates. No acute fracture or suspicious osseous lesion is identified. The spinal cord is normal in caliber. Mild calcific atherosclerosis is noted at the carotid bifurcations.  C2-3: Mild left uncovertebral spurring, minimal disc bulging, and minimal facet arthrosis result in mild left neural foraminal stenosis without spinal stenosis.  C3-4: Mild disc bulging, uncovertebral spurring, and moderate to severe right and mild left facet arthrosis result in moderate to severe bilateral neural foraminal stenosis without significant spinal stenosis.  C4-5: Solid fusion. No stenosis.  C5-6: Solid fusion. No stenosis.  C6-7: ACDF with pseudoarthrosis. Mild spurring without significant stenosis.  C7-T1: Mild disc bulging, mild endplate spurring, and mild facet arthrosis result in mild bilateral neural foraminal stenosis without spinal stenosis.  IMPRESSION: 1. 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 2. Mild bilateral neural foraminal stenosis  at C7-T1. 3. Moderate to severe bilateral neural foraminal stenosis at C3-4.   Electronically Signed   By: Sebastian Ache M.D.   On: 11/18/2018 12:12  He states that right-sided neck pain radiates into the right shoulder and just above his elbow x2 months.  No injury.  No complaints of right upper extremity numbness tingling or weakness.  He does have chronic left arm weakness and states that he continues to frequently drop objects.  It looks like  patient did not return to see Dr. Otelia Sergeant to review CT cervical spine and Dr. Otelia Sergeant gave the results over the phone.  Dr. Otelia Sergeant recommended patient go to Dr. Ollen Bowl with pain management to do a cervical ESI but patient states that he did not have this done.  Reports that his wife was ill last year and she recently passed away.   Review of Systems No current cardiopulmonary GI GU issues  Objective: Vital Signs: There were no vitals taken for this visit.  Physical Exam HENT:     Head: Normocephalic.  Eyes:     Extraocular Movements: Extraocular movements intact.     Pupils: Pupils are equal, round, and reactive to light.  Pulmonary:     Effort: No respiratory distress.  Musculoskeletal:     Comments: Positive bilateral Spurling test.  Positive right greater than left brachial plexus and trapezius tenderness.  Positive left triceps biceps and grip weakness.  Bilateral elbows good range of motion.  Bilateral shoulders unremarkable.  Negative impingement test.  Bilateral elbows good range of motion.  Negative Tinel's over the cubital tunnels.  Bilateral wrist good range of motion.  Negative Tinel's over the carpal tunnels.  Neurological:     Mental Status: He is alert and oriented to person, place, and time.     Ortho Exam  Specialty Comments:  No specialty comments available.  Imaging: No results found.   PMFS History: Patient Active Problem List   Diagnosis Date Noted  . Post PTCA 10/12/2015  . Abnormal cardiovascular stress test 10/11/2015  . Atypical chest pain 10/11/2015   Past Medical History:  Diagnosis Date  . Arthritis    "all over"  . Chronic lower back pain   . Early cataracts, bilateral   . Family history of adverse reaction to anesthesia    "son had ligament repair of elbow in 2016; woke up nauseated and disoriented"  . Hypercholesterolemia   . Hypertension   . Migraine    "none in years" (10/12/2015)  . Restless leg syndrome   . Wears glasses     Family  History  Problem Relation Age of Onset  . Aneurysm Mother   . Heart attack Father   . Hypertension Father   . Cancer Other     Past Surgical History:  Procedure Laterality Date  . ANTERIOR CERVICAL DECOMP/DISCECTOMY FUSION  ~ 2005  . ANTERIOR CERVICAL DISCECTOMY    . BACK SURGERY    . CARDIAC CATHETERIZATION N/A 10/12/2015   Procedure: Left Heart Cath and Coronary Angiography;  Surgeon: Yates Decamp, MD;  Location: [facility] INVASIVE CV LAB;  Service: Cardiovascular;  Laterality: N/A;  . CARDIAC CATHETERIZATION  10/12/2015   Procedure: Coronary Stent Intervention;  Surgeon: Yates Decamp, MD;  Location: [facility] INVASIVE CV LAB;  Service: Cardiovascular;;  . COLONOSCOPY    . CORONARY ANGIOPLASTY  10/12/2015   DES RCA  . EYE SURGERY    . FRACTURE SURGERY    . GLAUCOMA SURGERY Left 03/2015  . JOINT REPLACEMENT    .  KNEE ARTHROSCOPY Bilateral   . LUMBAR DISC SURGERY  ~ 1986-02/08/2007 X 6  . MAXIMUM ACCESS (MAS)POSTERIOR LUMBAR INTERBODY FUSION (PLIF) 3 LEVEL  02/08/2007   w/rods and screws  . SPINAL CORD STIMULATOR INSERTION N/A 05/08/2014   Procedure: LUMBAR SPINAL CORD STIMULATOR IMPLANT ;  Surgeon: Gwynne Edinger, MD;  Location: [facility]URO [facility]S;  Service: Neurosurgery;  Laterality: N/A;  . TOE FUSION Right 06/2014   "outside part of big toe"  . TOTAL KNEE ARTHROPLASTY Left ~ 2011   Social History   Occupational History  . Not on file  Tobacco Use  . Smoking status: Never Smoker  . Smokeless tobacco: Never Used  Vaping Use  . Vaping Use: Never used  Substance and Sexual Activity  . Alcohol use: Not Currently  . Drug use: No  . Sexual activity: Not Currently

## 2020-03-03 ENCOUNTER — Other Ambulatory Visit: Payer: Self-pay | Admitting: Surgery

## 2020-03-03 ENCOUNTER — Telehealth: Payer: Self-pay | Admitting: Nurse Practitioner

## 2020-03-03 DIAGNOSIS — M542 Cervicalgia: Secondary | ICD-10-CM

## 2020-03-03 NOTE — Telephone Encounter (Signed)
Phone call to patient to verify medication list and allergies for myelogram procedure. Pt instructed to hold elavil for 48hrs prior to myelogram appointment time. Pt verbalized understanding. Pre and post procedure instructions reviewed with pt. 

## 2020-03-09 ENCOUNTER — Inpatient Hospital Stay: Admission: RE | Admit: 2020-03-09 | Payer: Medicare PPO | Source: Ambulatory Visit

## 2020-03-15 ENCOUNTER — Other Ambulatory Visit: Payer: Medicare PPO

## 2020-03-15 ENCOUNTER — Inpatient Hospital Stay
Admission: RE | Admit: 2020-03-15 | Discharge: 2020-03-15 | Disposition: A | Discharge status: Home / Self Care | Payer: Medicare PPO | Source: Ambulatory Visit | Attending: Surgery | Admitting: Surgery

## 2020-03-15 NOTE — Discharge Instructions (Signed)

## 2020-03-22 ENCOUNTER — Other Ambulatory Visit: Payer: Self-pay | Admitting: Cardiology

## 2020-03-22 DIAGNOSIS — I119 Hypertensive heart disease without heart failure: Secondary | ICD-10-CM

## 2020-03-23 ENCOUNTER — Other Ambulatory Visit: Payer: Self-pay

## 2020-03-23 ENCOUNTER — Ambulatory Visit
Admission: RE | Admit: 2020-03-23 | Discharge: 2020-03-23 | Disposition: A | Discharge status: Home / Self Care | Payer: Medicare PPO | Source: Ambulatory Visit | Attending: Surgery | Admitting: Surgery

## 2020-03-23 DIAGNOSIS — M4322 Fusion of spine, cervical region: Secondary | ICD-10-CM

## 2020-03-23 DIAGNOSIS — M960 Pseudarthrosis after fusion or arthrodesis: Secondary | ICD-10-CM

## 2020-03-23 DIAGNOSIS — M5412 Radiculopathy, cervical region: Secondary | ICD-10-CM

## 2020-03-23 DIAGNOSIS — M542 Cervicalgia: Secondary | ICD-10-CM

## 2020-03-23 MED ORDER — DIAZEPAM 5 MG PO TABS
5.0000 mg | ORAL_TABLET | Freq: Once | ORAL | Status: AC
  Administered 2020-03-23: 5 mg via ORAL

## 2020-03-23 MED ORDER — IOPAMIDOL (ISOVUE-M 300) INJECTION 61%
10.0000 mL | Freq: Once | INTRAMUSCULAR | Status: AC | PRN
  Administered 2020-03-23: 10 mL via INTRATHECAL

## 2020-03-23 NOTE — Discharge Instructions (Signed)

## 2020-03-23 NOTE — Progress Notes (Signed)
Pt reports he has been off of his Amitriptyline for at least 48 hours. Pt verbalized understanding that he can restart this medication tomorrow at 1030 AM.

## 2020-04-01 ENCOUNTER — Ambulatory Visit (INDEPENDENT_AMBULATORY_CARE_PROVIDER_SITE_OTHER): Payer: Medicare PPO | Admitting: Specialist

## 2020-04-01 ENCOUNTER — Other Ambulatory Visit: Payer: Self-pay

## 2020-04-01 ENCOUNTER — Encounter: Payer: Self-pay | Admitting: Specialist

## 2020-04-01 VITALS — BP 125/82 | HR 65 | Ht 70.0 in | Wt 205.0 lb

## 2020-04-01 DIAGNOSIS — M4726 Other spondylosis with radiculopathy, lumbar region: Secondary | ICD-10-CM | POA: Diagnosis not present

## 2020-04-01 DIAGNOSIS — M5137 Other intervertebral disc degeneration, lumbosacral region: Secondary | ICD-10-CM

## 2020-04-01 DIAGNOSIS — M960 Pseudarthrosis after fusion or arthrodesis: Secondary | ICD-10-CM

## 2020-04-01 MED ORDER — DULOXETINE HCL 20 MG PO CPEP: 20.0000 mg | ORAL_CAPSULE | Freq: Every day | ORAL | 3 refills | Status: DC

## 2020-04-01 NOTE — Progress Notes (Signed)
Office Visit Note   Patient: Brian Gallegos           Date of Birth: [birth year]           MRN: [id number] Visit Date: 04/01/2020              Requested by: Sigmund Hazel, MD [address] [place],  [place] [address] PCP: Sigmund Hazel, MD   Assessment & Plan: Visit Diagnoses:  1. Other spondylosis with radiculopathy, lumbar region   2. Pseudarthrosis after fusion or arthrodesis   3. Disc disease, degenerative, lumbar or lumbosacral     Plan:Avoid overhead lifting and overhead use of the arms. Do not lift greater than 5 lbs. Adjust head rest in vehicle to prevent hyperextension if rear ended. Take extra precautions to avoid falling. Hemp CBD capsules, amazon.com 5,000-7,000 mg per bottle, 60 capsules per bottle, take one capsule twice a day.  Follow-Up Instructions: No follow-ups on file.   Orders:  No orders of the defined types were placed in this encounter.  No orders of the defined types were placed in this encounter.     Procedures: No procedures performed   Clinical Data: No additional findings.   Subjective: Chief Complaint  Patient presents with  . Neck - Follow-up    CT Myelogram review    74 year old male with history of right knee OA and lumbar fusion L2 to L5 with Bilateral L5 foramenal stenosis, previous C4 to C7 fusion with pseudarthrosis C6-7. He is experiencing intermittant right shoulder and lateral upper arm pain. There is weakness and he is dropping items with the right hand, like hold the Cell phone and it just drops. No bowel or bladder dysfunction. Wife passed away this past spring. He is moving to new home in [place].    Review of Systems  Constitutional: Negative.   HENT: Negative.   Eyes: Negative.   Respiratory: Negative.   Cardiovascular: Negative.   Gastrointestinal: Negative.   Endocrine: Negative.   Genitourinary: Negative.   Musculoskeletal: Negative.   Skin: Negative.   Allergic/Immunologic: Negative.     Neurological: Negative.   Hematological: Negative.   Psychiatric/Behavioral: Negative.      Objective: Vital Signs: BP 125/82 (BP Location: Left Arm, Patient Position: Sitting)   Pulse 65   Ht 5\' 10"  (1.778 m)   Wt 205 lb (93 kg)   BMI 29.41 kg/m   Physical Exam Constitutional:      Appearance: He is well-developed.  HENT:     Head: Normocephalic and atraumatic.  Eyes:     Pupils: Pupils are equal, round, and reactive to light.  Pulmonary:     Effort: Pulmonary effort is normal.     Breath sounds: Normal breath sounds.  Abdominal:     General: Bowel sounds are normal.     Palpations: Abdomen is soft.  Musculoskeletal:     Cervical back: Normal range of motion and neck supple.     Lumbar back: Negative right straight leg raise test and negative left straight leg raise test.  Skin:    General: Skin is warm and dry.  Neurological:     Mental Status: He is alert and oriented to person, place, and time.  Psychiatric:        Behavior: Behavior normal.        Thought Content: Thought content normal.        Judgment: Judgment normal.     Back Exam   Tenderness  The patient is experiencing  tenderness in the lumbar.  Range of Motion  Extension: abnormal  Flexion: abnormal  Lateral bend right: abnormal  Lateral bend left: abnormal  Rotation right: abnormal  Rotation left: abnormal   Muscle Strength  Right Quadriceps:  5/5  Left Quadriceps:  5/5  Right Hamstrings:  5/5  Left Hamstrings:  5/5   Tests  Straight leg raise right: negative Straight leg raise left: negative  Reflexes  Patellar: 2/4 Achilles: 2/4  Other  Toe walk: normal Heel walk: normal  Comments:  Right sided discomfort.      Specialty Comments:  No specialty comments available.  Imaging: No results found.   PMFS History: Patient Active Problem List   Diagnosis Date Noted  . Post PTCA 10/12/2015  . Abnormal cardiovascular stress test 10/11/2015  . Atypical chest pain  10/11/2015   Past Medical History:  Diagnosis Date  . Arthritis    "all over"  . Chronic lower back pain   . Early cataracts, bilateral   . Family history of adverse reaction to anesthesia    "son had ligament repair of elbow in 2016; woke up nauseated and disoriented"  . Hypercholesterolemia   . Hypertension   . Migraine    "none in years" (10/12/2015)  . Restless leg syndrome   . Wears glasses     Family History  Problem Relation Age of Onset  . Aneurysm Mother   . Heart attack Father   . Hypertension Father   . Cancer Other     Past Surgical History:  Procedure Laterality Date  . ANTERIOR CERVICAL DECOMP/DISCECTOMY FUSION  ~ 2005  . ANTERIOR CERVICAL DISCECTOMY    . BACK SURGERY    . CARDIAC CATHETERIZATION N/A 10/12/2015   Procedure: Left Heart Cath and Coronary Angiography;  Surgeon: Yates Decamp, MD;  Location: [facility] INVASIVE CV LAB;  Service: Cardiovascular;  Laterality: N/A;  . CARDIAC CATHETERIZATION  10/12/2015   Procedure: Coronary Stent Intervention;  Surgeon: Yates Decamp, MD;  Location: [facility] INVASIVE CV LAB;  Service: Cardiovascular;;  . COLONOSCOPY    . CORONARY ANGIOPLASTY  10/12/2015   DES RCA  . EYE SURGERY    . FRACTURE SURGERY    . GLAUCOMA SURGERY Left 03/2015  . JOINT REPLACEMENT    . KNEE ARTHROSCOPY Bilateral   . LUMBAR DISC SURGERY  ~ 1986-02/08/2007 X 6  . MAXIMUM ACCESS (MAS)POSTERIOR LUMBAR INTERBODY FUSION (PLIF) 3 LEVEL  02/08/2007   w/rods and screws  . SPINAL CORD STIMULATOR INSERTION N/A 05/08/2014   Procedure: LUMBAR SPINAL CORD STIMULATOR IMPLANT ;  Surgeon: Gwynne Edinger, MD;  Location: [facility];  Service: Neurosurgery;  Laterality: N/A;  . TOE FUSION Right 06/2014   "outside part of big toe"  . TOTAL KNEE ARTHROPLASTY Left ~ 2011   Social History   Occupational History  . Not on file  Tobacco Use  . Smoking status: Never Smoker  . Smokeless tobacco: Never Used  Vaping Use  . Vaping Use: Never used  Substance and Sexual Activity  .  Alcohol use: Not Currently  . Drug use: No  . Sexual activity: Not Currently

## 2020-04-01 NOTE — Patient Instructions (Signed)
Avoid overhead lifting and overhead use of the arms. Do not lift greater than 5 lbs. Adjust head rest in vehicle to prevent hyperextension if rear ended. Take extra precautions to avoid falling. Hemp CBD capsules, amazon.com 5,000-7,000 mg per bottle, 60 capsules per bottle, take one capsule twice a day.  Follow-Up Instructions: No follow-ups on file.

## 2020-04-10 ENCOUNTER — Other Ambulatory Visit: Payer: Self-pay | Admitting: Cardiology

## 2020-04-10 DIAGNOSIS — I119 Hypertensive heart disease without heart failure: Secondary | ICD-10-CM

## 2020-04-30 ENCOUNTER — Ambulatory Visit: Payer: Medicare PPO | Admitting: Cardiology

## 2020-05-03 ENCOUNTER — Ambulatory Visit: Payer: Medicare PPO | Admitting: Physical Therapy

## 2020-05-08 ENCOUNTER — Other Ambulatory Visit: Payer: Self-pay | Admitting: Cardiology

## 2020-05-10 ENCOUNTER — Ambulatory Visit: Payer: Medicare PPO | Attending: Specialist | Admitting: Physical Therapy

## 2020-05-17 ENCOUNTER — Encounter: Payer: Medicare PPO | Admitting: Physical Therapy

## 2020-05-19 ENCOUNTER — Ambulatory Visit: Payer: Medicare PPO | Admitting: Cardiology

## 2020-05-19 ENCOUNTER — Encounter: Payer: Self-pay | Admitting: Cardiology

## 2020-05-19 ENCOUNTER — Other Ambulatory Visit: Payer: Self-pay

## 2020-05-19 VITALS — BP 119/63 | HR 56 | Resp 16 | Ht 70.0 in | Wt 185.0 lb

## 2020-05-19 DIAGNOSIS — I119 Hypertensive heart disease without heart failure: Secondary | ICD-10-CM

## 2020-05-19 DIAGNOSIS — I2510 Atherosclerotic heart disease of native coronary artery without angina pectoris: Secondary | ICD-10-CM

## 2020-05-19 DIAGNOSIS — E7800 Pure hypercholesterolemia, unspecified: Secondary | ICD-10-CM

## 2020-05-19 DIAGNOSIS — Z955 Presence of coronary angioplasty implant and graft: Secondary | ICD-10-CM

## 2020-05-19 NOTE — Progress Notes (Signed)
Brian Gallegos Date of Birth: 07/12/1945 MRN: [id number] Primary Care Provider:Miller, Misty StanleyLisa, MD Former Cardiology Providers: Altamese CarolinaAshton Kelley, APRN, FNP-C Primary Cardiologist: Tessa LernerSunit Constantin Hillery, DO, [facility] (established care 12/30/2019)  Date: 05/19/20 Last Office Visit: 01/29/2020  Chief Complaint  Patient presents with  . Follow-up    3 month  . Coronary Artery Disease    HPI  Brian GuernseyClifford P Fanguy is a 74 y.o.  male who presents to the office with a chief complaint of " 5371-month follow-up for CAD management." Patient's past medical history and cardiovascular risk factors include: Established coronary artery disease without angina pectoris, status post stenting in the RCA in April 2017, hypertension, hyperlipidemia, prediabetes, advanced age.  Patient was originally under care of Altamese CarolinaAshton Kelley, APRN, FNP-C and reestablish care with myself back in June 2021.  Since last office visit patient states that he is doing well from a cardiovascular standpoint.  No hospitalizations or urgent care visits due to anginal symptoms or heart failure.  Patient has been fully vaccinated for COVID-19 infection.  Patient states that he also had [facility]; however, did not have any access to them.  Will request records.  Patient states that he has another appointment in 3 months.   His last echocardiogram noted mildly reduced left ventricular systolic function with global hypokinesis and mild to moderate valvular heart disease.  These findings were discussed with him in great detail at today's office visit.  His nuclear stress test was noted to be intermediate risk due to reduced LVEF as per gated SPECT of 44%.  Overall the myocardial perfusion was reported to be normal.  ALLERGIES: Allergies  Allergen Reactions  . Codeine Itching    Low amount is tolerated   MEDICATION LIST PRIOR TO VISIT: Current Outpatient Medications on File Prior to Visit  Medication Sig Dispense Refill  .  amitriptyline (ELAVIL) 25 MG tablet Take 50 mg by mouth at bedtime.     Marland Kitchen. amLODipine (NORVASC) 5 MG tablet TAKE 1 TABLET(5 MG) BY MOUTH EVERY EVENING (Patient taking differently: 2.5 mg daily. Take 1/2 tablet by mouth daily.) 90 tablet 0  . aspirin EC 81 MG tablet Take 81 mg by mouth daily.    Marland Kitchen. atenolol (TENORMIN) 50 MG tablet TAKE 1 TABLET BY MOUTH DAILY. HOLD IF SYSTOLIC BLOOD PRESSURE IS LESS THEN 100MMHG OR IF HEART RATE IS LESS THAN 60 BPM 90 tablet 0  . Cholecalciferol 50 MCG (2000 UT) CAPS Take 2,000 Units by mouth daily.     . cloNIDine (CATAPRES) 0.2 MG tablet Take 0.2 mg by mouth 2 (two) times daily.    . diclofenac sodium (VOLTAREN) 1 % GEL Apply 4 g topically 4 (four) times daily. 5 Tube 1  . doxazosin (CARDURA) 8 MG tablet Take 8 mg by mouth daily.    . DULoxetine (CYMBALTA) 20 MG capsule Take 1 capsule (20 mg total) by mouth daily. 30 capsule 3  . furosemide (LASIX) 20 MG tablet Take 1 tablet (20 mg total) by mouth in the morning. (Patient taking differently: Take 40 mg by mouth in the morning. Take 1/2 tablet (20mg ) daily.) 90 tablet 0  . gabapentin (NEURONTIN) 300 MG capsule TAKE 1 CAPSULE BY MOUTH THREE TIMES DAILY 270 capsule 0  . lisinopril (ZESTRIL) 40 MG tablet Take 1 tablet (40 mg total) by mouth in the morning. (Patient taking differently: Take 40 mg by mouth in the morning and at bedtime. ) 90 tablet 0  . lubiprostone (AMITIZA) 8  MCG capsule Take 8 mcg by mouth 2 (two) times daily with a meal.     . methocarbamol (ROBAXIN) 750 MG tablet Take 750 mg by mouth daily.     . potassium chloride (KLOR-CON) 10 MEQ tablet TAKE 1 TABLET BY MOUTH DAILY 30 tablet 6  . pyridoxine (B-6) 100 MG tablet Take 100 mg by mouth daily.    Marland Kitchen rOPINIRole (REQUIP) 0.5 MG tablet Take 0.5 mg by mouth 2 (two) times a day. Bedtime and prn throughout the day    . rosuvastatin (CRESTOR) 40 MG tablet Take 40 mg by mouth daily. Take one tablet by mouth daily ( )    . thiamine (VITAMIN B-1) 100 MG tablet  Take 100 mg by mouth daily.    . vitamin B-12 (CYANOCOBALAMIN) 1000 MCG tablet Take 1,000 mcg by mouth daily.     Marland Kitchen atorvastatin (LIPITOR) 80 MG tablet Take 80 mg by mouth daily. (Patient not taking: Reported on 05/19/2020)     Current Facility-Administered Medications on File Prior to Visit  Medication Dose Route Frequency Provider Last Rate Last Admin  . methylPREDNISolone acetate (DEPO-MEDROL) injection 80 mg  80 mg Intramuscular Once Naida Sleight, PA-C        PAST MEDICAL HISTORY: Past Medical History:  Diagnosis Date  . Arthritis    "all over"  . Chronic lower back pain   . Early cataracts, bilateral   . Family history of adverse reaction to anesthesia    "son had ligament repair of elbow in 2016; woke up nauseated and disoriented"  . Hypercholesterolemia   . Hypertension   . Migraine    "none in years" (10/12/2015)  . Restless leg syndrome   . Wears glasses     PAST SURGICAL HISTORY: Past Surgical History:  Procedure Laterality Date  . ANTERIOR CERVICAL DECOMP/DISCECTOMY FUSION  ~ 2005  . ANTERIOR CERVICAL DISCECTOMY    . BACK SURGERY    . CARDIAC CATHETERIZATION N/A 10/12/2015   Procedure: Left Heart Cath and Coronary Angiography;  Surgeon: Yates Decamp, MD;  Location: [facility] INVASIVE CV LAB;  Service: Cardiovascular;  Laterality: N/A;  . CARDIAC CATHETERIZATION  10/12/2015   Procedure: Coronary Stent Intervention;  Surgeon: Yates Decamp, MD;  Location: [facility] INVASIVE CV LAB;  Service: Cardiovascular;;  . COLONOSCOPY    . CORONARY ANGIOPLASTY  10/12/2015   DES RCA  . EYE SURGERY    . FRACTURE SURGERY    . GLAUCOMA SURGERY Left 03/2015  . JOINT REPLACEMENT    . KNEE ARTHROSCOPY Bilateral   . LUMBAR DISC SURGERY  ~ 1986-02/08/2007 X 6  . MAXIMUM ACCESS (MAS)POSTERIOR LUMBAR INTERBODY FUSION (PLIF) 3 LEVEL  02/08/2007   w/rods and screws  . SPINAL CORD STIMULATOR INSERTION N/A 05/08/2014   Procedure: LUMBAR SPINAL CORD STIMULATOR IMPLANT ;  Surgeon: Gwynne Edinger, MD;  Location: [facility]  NEURO ORS;  Service: Neurosurgery;  Laterality: N/A;  . TOE FUSION Right 06/2014   "outside part of big toe"  . TOTAL KNEE ARTHROPLASTY Left ~ 2011    FAMILY HISTORY: The patient's family history includes Aneurysm in his mother; Cancer in an other family member; Heart attack in his father; Hypertension in his father.   SOCIAL HISTORY:  The patient  reports that he has never smoked. He has never used smokeless tobacco. He reports previous alcohol use. He reports that he does not use drugs.  Review of Systems  Constitutional: Negative for chills and fever.  HENT: Negative for hoarse voice and nosebleeds.  Eyes: Negative for discharge, double vision and pain.  Cardiovascular: Negative for chest pain, claudication, dyspnea on exertion, leg swelling, near-syncope, orthopnea, palpitations, paroxysmal nocturnal dyspnea and syncope.  Respiratory: Negative for hemoptysis and shortness of breath.   Musculoskeletal: Negative for muscle cramps and myalgias.  Gastrointestinal: Negative for abdominal pain, constipation, diarrhea, hematemesis, hematochezia, melena, nausea and vomiting.  Neurological: Negative for dizziness and light-headedness.    PHYSICAL EXAM: Vitals with BMI 05/19/2020 04/01/2020 03/23/2020  Height 5\' 10"  5\' 10"  -  Weight 185 lbs 205 lbs -  BMI 26.54 29.41 -  Systolic 119 125  Diastolic 63 82 76  Pulse 56 65 61    CONSTITUTIONAL: Well-developed and well-nourished. No acute distress.  SKIN: Skin is warm and dry. No rash noted. No cyanosis. No pallor. No jaundice HEAD: Normocephalic and atraumatic.  EYES: No scleral icterus MOUTH/THROAT: Moist oral membranes.  NECK: No JVD present. No thyromegaly noted. LYMPHATIC: No visible cervical adenopathy.  CHEST Normal respiratory effort. No intercostal retractions  LUNGS: Clear to auscultation bilaterally.  No stridor. No wheezes. No rales.  CARDIOVASCULAR: Regular rate and rhythm, positive S1-S2, no murmurs rubs or gallops  appreciated. ABDOMINAL: Soft, nontender, nondistended, positive bowel sounds in all 4 quadrants.  No apparent ascites.  EXTREMITIES: No peripheral edema, warm to touch bilaterally.  With 2+ dorsalis pedis and posterior tibial pulses. HEMATOLOGIC: No significant bruising NEUROLOGIC: Oriented to person, place, and time. Nonfocal. Normal muscle tone.  PSYCHIATRIC: Normal mood and affect. Normal behavior. Cooperative  CARDIAC DATABASE: EKG: 05/27/2019: Sinus bradycardia at 58 bpm, normal axis, PRWP cannot exclude anterior infarct old. No evidence of ischemia.  12/30/2019: Sinus  Bradycardia, 54bpm, without underlying injury pattern.  Echocardiogram: 01/19/2020: LVEF 45-50%, mild global hypokinesis, moderate LVH, grade 1 diastolic impairment, normal LAP, moderately dilated left atrium, mild AR, moderate MR.  Stress Testing:  Lexiscan/modified Bruce Tetrofosmin stress test 01/19/2020:  Lexiscan/modified Bruce nuclear stress test performed using 1-day protocol.  Stress EKG is non-diagnostic, as this is pharmacological stress test. 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  Normal myocardial perfusion. Stress LVEF calculated 44%, although visually appears normal.  Intermediate risk study due to reduced LVEF. Recommend clinical correlation.   Heart Catheterization: Coronary angiogram 10/12/2015: Normal LV systolic function. Moderate disease in the mid circumflex coronary artery of 50%. Diffuse coronary calcification involving the proximal LAD. Right coronary artery very large and dominant. Proximal segment has ulcerated 90% stenosis. Mild amount of calcification is also evident. Stenting proximal large dominant RCA for an ulcerated 90% stenosis to 0% with implantation of a 4.0 x 18 mm resolute DES.  Vascular imaging: Abdominal aortic duplex 05/30/2017: Focal plaque noted in the proximal, mid and distal aorta. The maximum aorta  diameter is 2.82 cm (dist). No AAA. Normal iliac velocity.  LABORATORY DATA: CBC Latest Ref Rng & Units 04/29/2014 04/22/2010 04/21/2010  WBC 4.0 - 10.5 K/uL 6.5 13.3(H) 17.7(H)  Hemoglobin 13.0 - 17.0 g/dL 04/24/2010 04/23/2010) 10.5(L)  Hematocrit 39 - 52 % 38.4(L) 29.5(L) 32.0(L)  Platelets 150 - 400 K/uL 210 146(L) 152    CMP Latest Ref Rng & Units 01/06/2020 04/29/2014 04/20/2010  Glucose 65 - 99 mg/dL 05/01/2014) 04/22/2010) 443(X)  BUN 8 - 27 mg/dL 11 10 11   Creatinine 0.76 - 1.27 mg/dL 540(G 867(Y  Sodium 134 - 144 mmol/L 137 138 137  Potassium 3.5 - 5.2 mmol/L 3.9 4.1 3.9  Chloride 96 - 106 mmol/L 99 99 107  CO2 20 - 29 mmol/L 22 27 26   Calcium 8.6 - 10.2 mg/dL 9.7 9.3 )  Total Protein 6.0 - 8.3 g/dL - - -  Total Bilirubin 0.3 - 1.2 mg/dL - - -  Alkaline Phos 39 - 117 U/L - - -  AST 0 - 37 U/L - - -  ALT 0 - 53 U/L - - -    Lipid Panel  No results found for: CHOL, TRIG, HDL, CHOLHDL, VLDL, LDLCALC, LDLDIRECT, LABVLDL  Lab Results  Component Value Date   HGBA1C  04/20/2010    5.6 (NOTE)                                                                       According to the ADA Clinical Practice Recommendations for 2011, when HbA1c is used as a screening test:   >=6.5%   Diagnostic of Diabetes Mellitus           (if abnormal result  is confirmed)  5.7-6.4%   Increased risk of developing Diabetes Mellitus  References:Diagnosis and Classification of Diabetes Mellitus,Diabetes Care,2011,34(Suppl 1):S62-S69 and Standards of Medical Care in         Diabetes - 2011,Diabetes Care,2011,34  (Suppl 1):S11-S61.   No components found for: NTPROBNP No results found for: TSH  Cardiac Panel (last 3 results) No results for input(s): CKTOTAL, CKMB, TROPONINIHS, RELINDX in the last 72 hours.  IMPRESSION:    ICD-10-CM   1. Atherosclerosis of native coronary artery of native heart without angina pectoris  I25.10   2. History of coronary angioplasty with insertion of stent  Z95.5   3. Hypertension  with heart disease  I11.9   4. Hypercholesterolemia  E78.00      RECOMMENDATIONS: SHELL YANDOW is a 74 y.o. male whose past medical history and cardiovascular risk factors include: Established coronary artery disease without angina pectoris, status post stenting in the RCA in April 2017, hypertension, hyperlipidemia, prediabetes, advanced age.  Established coronary artery disease without angina pectoris and prior PCI:  Recently had an ischemic evaluation including an echo and stress test.  Results noted above.    Patient remains asymptomatic with regard to anginal symptoms.  Medications reconciled.  Benign essential hypertension:  Office blood pressures are very well controlled.    Continue current medical therapy.    Currently managed by his providers at the [facility].    Patient has an upcoming appointment in 3 months and will have blood work done.  Patient is requested to send [facility] a copy or to bring it in with him into the next office visit.  Recommend a low-salt diet.  Patient is asked to keep a log of his blood pressures and call the office if he has systolic blood pressure greater than 140 mmHg consistently.  Hypercholesterolemia: . Continue statin therapy.   . Follow lipids. . Currently managed by primary care provider. . Patient denies myalgia or other side effects.  FINAL MEDICATION LIST END OF ENCOUNTER: No orders of the defined types were placed in this encounter.   Medications Discontinued During This Encounter  Medication Reason  . diclofenac (FLECTOR) 1.3 % PTCH Patient Preference  . methylPREDNISolone (MEDROL) 4 MG tablet Patient Preference  . Oxycodone HCl 10 MG TABS Patient  Preference     Current Outpatient Medications:  .  amitriptyline (ELAVIL) 25 MG tablet, Take 50 mg by mouth at bedtime. , Disp: , Rfl:  .  amLODipine (NORVASC) 5 MG tablet, TAKE 1 TABLET(5 MG) BY MOUTH EVERY EVENING (Patient taking differently: 2.5 mg daily. Take 1/2 tablet by  mouth daily.), Disp: 90 tablet, Rfl: 0 .  aspirin EC 81 MG tablet, Take 81 mg by mouth daily., Disp: , Rfl:  .  atenolol (TENORMIN) 50 MG tablet, TAKE 1 TABLET BY MOUTH DAILY. HOLD IF SYSTOLIC BLOOD PRESSURE IS LESS THEN OR IF HEART RATE IS LESS THAN 60 BPM, Disp: 90 tablet, Rfl: 0 .  Cholecalciferol 50 MCG (2000 UT) CAPS, Take 2,000 Units by mouth daily. , Disp: , Rfl:  .  cloNIDine (CATAPRES) 0.2 MG tablet, Take 0.2 mg by mouth 2 (two) times daily., Disp: , Rfl:  .  diclofenac sodium (VOLTAREN) 1 % GEL, Apply 4 g topically 4 (four) times daily., Disp: 5 Tube, Rfl: 1 .  doxazosin (CARDURA) 8 MG tablet, Take 8 mg by mouth daily., Disp: , Rfl:  .  DULoxetine (CYMBALTA) 20 MG capsule, Take 1 capsule (20 mg total) by mouth daily., Disp: 30 capsule, Rfl: 3 .  furosemide (LASIX) 20 MG tablet, Take 1 tablet (20 mg total) by mouth in the morning. (Patient taking differently: Take 40 mg by mouth in the morning. Take 1/2 tablet ( ) daily.), Disp: 90 tablet, Rfl: 0 .  gabapentin (NEURONTIN) 300 MG capsule, TAKE 1 CAPSULE BY MOUTH THREE TIMES DAILY, Disp: 270 capsule, Rfl: 0 .  lisinopril (ZESTRIL) 40 MG tablet, Take 1 tablet (40 mg total) by mouth in the morning. (Patient taking differently: Take 40 mg by mouth in the morning and at bedtime. ), Disp: 90 tablet, Rfl: 0 .  lubiprostone (AMITIZA) 8 MCG capsule, Take 8 mcg by mouth 2 (two) times daily with a meal. , Disp: , Rfl:  .  methocarbamol (ROBAXIN) 750 MG tablet, Take 750 mg by mouth daily. , Disp: , Rfl:  .  potassium chloride (KLOR-CON) 10 MEQ tablet, TAKE 1 TABLET BY MOUTH DAILY, Disp: 30 tablet, Rfl: 6 .  pyridoxine (B-6) 100 MG tablet, Take 100 mg by mouth daily., Disp: , Rfl:  .  rOPINIRole (REQUIP) 0.5 MG tablet, Take 0.5 mg by mouth 2 (two) times a day. Bedtime and prn throughout the day, Disp: , Rfl:  .  rosuvastatin (CRESTOR) 40 MG tablet, Take 40 mg by mouth daily. Take one tablet by mouth daily ( ), Disp: , Rfl:  .  thiamine  (VITAMIN B-1) 100 MG tablet, Take 100 mg by mouth daily., Disp: , Rfl:  .  vitamin B-12 (CYANOCOBALAMIN) 1000 MCG tablet, Take 1,000 mcg by mouth daily. , Disp: , Rfl:  .  atorvastatin (LIPITOR) 80 MG tablet, Take 80 mg by mouth daily. (Patient not taking: Reported on 05/19/2020), Disp: , Rfl:   Current Facility-Administered Medications:  .  methylPREDNISolone acetate (DEPO-MEDROL) injection 80 mg, 80 mg, Intramuscular, Once, Zonia Kief M, PA-C  No orders of the defined types were placed in this encounter.  --Continue cardiac medications as reconciled in final medication list. --Return in about 6 months (around 11/16/2020) for Follow up, CAD. Or sooner if needed. --Continue follow-up with your primary care physician regarding the management of your other chronic comorbid conditions.  Patient's questions and concerns were addressed to his satisfaction. He voices understanding of the instructions provided during this encounter.   This note was created using  a voice recognition software as a result there may be grammatical errors inadvertently enclosed that do not reflect the nature of this encounter. Every attempt is made to correct such errors.  Total time spent: 20 minutes.  Tessa Lerner, [place], [facility]  Pager: [phone] Office: [phone]

## 2020-06-05 ENCOUNTER — Encounter (HOSPITAL_COMMUNITY): Payer: Self-pay | Admitting: Emergency Medicine

## 2020-06-05 ENCOUNTER — Emergency Department (HOSPITAL_COMMUNITY)
Admission: EM | Admit: 2020-06-05 | Discharge: 2020-06-06 | Disposition: A | Discharge status: Home / Self Care | Payer: Medicare PPO | Attending: Emergency Medicine | Admitting: Emergency Medicine

## 2020-06-05 DIAGNOSIS — R109 Unspecified abdominal pain: Secondary | ICD-10-CM | POA: Diagnosis present

## 2020-06-05 DIAGNOSIS — R000 Tachycardia, unspecified: Secondary | ICD-10-CM | POA: Insufficient documentation

## 2020-06-05 DIAGNOSIS — Z79899 Other long term (current) drug therapy: Secondary | ICD-10-CM | POA: Diagnosis not present

## 2020-06-05 DIAGNOSIS — R339 Retention of urine, unspecified: Secondary | ICD-10-CM | POA: Insufficient documentation

## 2020-06-05 DIAGNOSIS — Z96652 Presence of left artificial knee joint: Secondary | ICD-10-CM | POA: Insufficient documentation

## 2020-06-05 DIAGNOSIS — I10 Essential (primary) hypertension: Secondary | ICD-10-CM | POA: Insufficient documentation

## 2020-06-05 DIAGNOSIS — Z7982 Long term (current) use of aspirin: Secondary | ICD-10-CM | POA: Diagnosis not present

## 2020-06-05 DIAGNOSIS — R338 Other retention of urine: Secondary | ICD-10-CM

## 2020-06-05 LAB — URINALYSIS, ROUTINE W REFLEX MICROSCOPIC
Bacteria, UA: NONE SEEN
Bilirubin Urine: NEGATIVE
Glucose, UA: NEGATIVE mg/dL
Hgb urine dipstick: NEGATIVE
Ketones, ur: NEGATIVE mg/dL
Leukocytes,Ua: NEGATIVE
Nitrite: NEGATIVE
Protein, ur: 30 mg/dL — AB
Specific Gravity, Urine: 1.008 (ref 1.005–1.030)
pH: 9 — ABNORMAL HIGH (ref 5.0–8.0)

## 2020-06-05 NOTE — ED Provider Notes (Signed)
[facility]-EMERGENCY DEPT Provider Note   CSN: [id number] Arrival date & time: 06/05/20  2224     History Chief Complaint  Patient presents with  . Urinary Retention  . Abdominal Pain    Brian Gallegos is a 74 y.o. male with a history of hypertension, migraines, hypercholesterolemia, spinal cord stimulator, lumbar fusion, and cervical fusion who presents to the ED via EMS with complaints of abdominal pain that began approximately 3 hours PTA. Patient states that he last urinated sometime earlier this evening and approximately 3 hours PTA started to have lower abdominal pain/pressure. He began to feel that he needed to urinate and could not. He tried taking ibuprofen & finasteride without relief. Called EMS- gave patient fentanyl en route. Patient was seen by ED nursing staff who placed foley catheter prior to my assessment with 700 cc output of clear yellow urine with relief. Patient states sxs are resolved at this time. He has had some mild trouble urinating over the past couple of days. Has not required a foley catheter previously other than post operatively. No new meds. Does take percocet as needed for chronic pain, but has been wheening off of this.  He does mention that he had a mechanical fall yesterday, was walking when his son's dog ran between his legs causing him to trip and land on the left hip. He denies head injury or LOC. Hip & neck feel a bit sore, but he was not particularly concerned about this. He denies fever, chills, nausea, vomiting, constipation, diarrhea, melena, hematochezia, hematuria, headache, back pain, chest pain, or dyspnea. He denies acute numbness/weakness, he does have some baseline weakness with grip strength with his chronic neck problems.   HPI     Past Medical History:  Diagnosis Date  . Arthritis    "all over"  . Chronic lower back pain   . Early cataracts, bilateral   . Family history of adverse reaction to anesthesia     "son had ligament repair of elbow in 2016; woke up nauseated and disoriented"  . Hypercholesterolemia   . Hypertension   . Migraine    "none in years" (10/12/2015)  . Restless leg syndrome   . Wears glasses     Patient Active Problem List   Diagnosis Date Noted  . Post PTCA 10/12/2015  . Abnormal cardiovascular stress test 10/11/2015  . Atypical chest pain 10/11/2015    Past Surgical History:  Procedure Laterality Date  . ANTERIOR CERVICAL DECOMP/DISCECTOMY FUSION  ~ 2005  . ANTERIOR CERVICAL DISCECTOMY    . BACK SURGERY    . CARDIAC CATHETERIZATION N/A 10/12/2015   Procedure: Left Heart Cath and Coronary Angiography;  Surgeon: Yates Decamp, MD;  Location: [facility] INVASIVE CV LAB;  Service: Cardiovascular;  Laterality: N/A;  . CARDIAC CATHETERIZATION  10/12/2015   Procedure: Coronary Stent Intervention;  Surgeon: Yates Decamp, MD;  Location: [facility] INVASIVE CV LAB;  Service: Cardiovascular;;  . COLONOSCOPY    . CORONARY ANGIOPLASTY  10/12/2015   DES RCA  . EYE SURGERY    . FRACTURE SURGERY    . GLAUCOMA SURGERY Left 03/2015  . JOINT REPLACEMENT    . KNEE ARTHROSCOPY Bilateral   . LUMBAR DISC SURGERY  ~ 1986-02/08/2007 X 6  . MAXIMUM ACCESS (MAS)POSTERIOR LUMBAR INTERBODY FUSION (PLIF) 3 LEVEL  02/08/2007   w/rods and screws  . SPINAL CORD STIMULATOR INSERTION N/A 05/08/2014   Procedure: LUMBAR SPINAL CORD STIMULATOR IMPLANT ;  Surgeon: Gwynne Edinger, MD;  Location: [facility]  NEURO ORS;  Service: Neurosurgery;  Laterality: N/A;  . TOE FUSION Right 06/2014   "outside part of big toe"  . TOTAL KNEE ARTHROPLASTY Left ~ 2011       Family History  Problem Relation Age of Onset  . Aneurysm Mother   . Heart attack Father   . Hypertension Father   . Cancer Other     Social History   Tobacco Use  . Smoking status: Never Smoker  . Smokeless tobacco: Never Used  Vaping Use  . Vaping Use: Never used  Substance Use Topics  . Alcohol use: Not Currently  . Drug use: No    Home Medications Prior  to Admission medications   Medication Sig Start Date End Date Taking? Authorizing Provider  amitriptyline (ELAVIL) 25 MG tablet Take 50 mg by mouth at bedtime.     [provider]  amLODipine (NORVASC) 5 MG tablet TAKE 1 TABLET(5 MG) BY MOUTH EVERY EVENING Patient taking differently: 2.5 mg daily. Take 1/2 tablet by mouth daily. 04/12/20   Tolia, Sunit, DO  aspirin EC 81 MG tablet Take 81 mg by mouth daily.    [provider]  atenolol (TENORMIN) 50 MG tablet TAKE 1 TABLET BY MOUTH DAILY. HOLD IF SYSTOLIC BLOOD PRESSURE IS LESS THEN 100MMHG OR IF HEART RATE IS LESS THAN 60 BPM 04/12/20   Tolia, Sunit, DO  atorvastatin (LIPITOR) 80 MG tablet Take 80 mg by mouth daily. Patient not taking: Reported on 05/19/2020    [provider]  Cholecalciferol 50 MCG (2000 UT) CAPS Take 2,000 Units by mouth daily.     [provider]  cloNIDine (CATAPRES) 0.2 MG tablet Take 0.2 mg by mouth 2 (two) times daily.    [provider]  diclofenac sodium (VOLTAREN) 1 % GEL Apply 4 g topically 4 (four) times daily. 10/17/17   Kerrin ChampagneNitka, James E, MD  doxazosin (CARDURA) 8 MG tablet Take 8 mg by mouth daily.    [provider]  DULoxetine (CYMBALTA) 20 MG capsule Take 1 capsule (20 mg total) by mouth daily. 04/01/20   Kerrin ChampagneNitka, James E, MD  furosemide (LASIX) 20 MG tablet Take 1 tablet (20 mg total) by mouth in the morning. Patient taking differently: Take 40 mg by mouth in the morning. Take 1/2 tablet (20mg ) daily. 12/30/19 05/19/20  Tolia, Sunit, DO  gabapentin (NEURONTIN) 300 MG capsule TAKE 1 CAPSULE BY MOUTH THREE TIMES DAILY 02/18/19   Kerrin ChampagneNitka, James E, MD  lisinopril (ZESTRIL) 40 MG tablet Take 1 tablet (40 mg total) by mouth in the morning. Patient taking differently: Take 40 mg by mouth in the morning and at bedtime.  12/30/19 05/19/20  Tolia, Sunit, DO  lubiprostone (AMITIZA) 8 MCG capsule Take 8 mcg by mouth 2 (two) times daily with a meal.     [provider]   methocarbamol (ROBAXIN) 750 MG tablet Take 750 mg by mouth daily.     [provider]  potassium chloride (KLOR-CON) 10 MEQ tablet TAKE 1 TABLET BY MOUTH DAILY 05/12/20   Tolia, Sunit, DO  pyridoxine (B-6) 100 MG tablet Take 100 mg by mouth daily.    [provider]  rOPINIRole (REQUIP) 0.5 MG tablet Take 0.5 mg by mouth 2 (two) times a day. Bedtime and prn throughout the day    [provider]  rosuvastatin (CRESTOR) 40 MG tablet Take 40 mg by mouth daily. Take one tablet by mouth daily (20mg )    [provider]  thiamine (VITAMIN B-1)  100 MG tablet Take 100 mg by mouth daily.    [provider]  vitamin B-12 (CYANOCOBALAMIN) 1000 MCG tablet Take 1,000 mcg by mouth daily.     [provider]    Allergies    Codeine  Review of Systems   Review of Systems  Constitutional: Negative for chills and fever.  Respiratory: Negative for shortness of breath.   Cardiovascular: Negative for chest pain.  Gastrointestinal: Positive for abdominal pain (resolved @ present). Negative for blood in stool, constipation, diarrhea, nausea and vomiting.  Genitourinary: Positive for difficulty urinating. Negative for frequency, scrotal swelling and testicular pain.  Musculoskeletal: Positive for arthralgias. Negative for back pain.  Neurological: Negative for syncope.  All other systems reviewed and are negative.   Physical Exam Updated Vital Signs BP (!) 212/108 (BP Location: Right Arm)   Pulse (!) 105   Temp 98 F (36.7 C) (Oral)   Resp 20   Ht 5\' 10"  (1.778 m)   Wt 83.9 kg   SpO2 97%   BMI 26.54 kg/m   Physical Exam Vitals and nursing note reviewed.  Constitutional:      General: He is not in acute distress.    Appearance: He is well-developed. He is not toxic-appearing.  HENT:     Head: Normocephalic and atraumatic.     Comments: No racoon eyes or battle sign.  Eyes:     General:        Right eye: No discharge.        Left eye: No  discharge.     Conjunctiva/sclera: Conjunctivae normal.  Neck:     Comments: ROM intact. No midline spinal tenderness.  Cardiovascular:     Rate and Rhythm: Regular rhythm. Tachycardia present.  Pulmonary:     Effort: Pulmonary effort is normal. No respiratory distress.     Breath sounds: Normal breath sounds. No wheezing, rhonchi or rales.  Chest:     Chest wall: No tenderness.  Abdominal:     General: There is no distension.     Palpations: Abdomen is soft.     Tenderness: There is no abdominal tenderness. There is no right CVA tenderness, left CVA tenderness, guarding or rebound.  Genitourinary:    Comments: Foley catheter in place with 700 cc of clear yellow urine in bag.  Musculoskeletal:     Cervical back: Neck supple.     Comments: No obvious deformities or significant ecchymosis or open wounds.  UEs: Intact active range of motion throughout the bilateral upper extremities.  No focal bony tenderness. Back: No midline tenderness or palpable step-off Lower extremities: Intact active range of motion throughout the bilateral lower extremities including the left hip.  No significant focal bony tenderness to palpation.  Skin:    General: Skin is warm and dry.     Findings: No rash.  Neurological:     Mental Status: He is alert.     Comments: Clear speech.  Sensation grossly intact bilateral upper and lower extremities.  Symmetric grip strength and strength with plantar dorsiflexion bilaterally without significant weakness noted.  Patient ambulatory.  Psychiatric:        Behavior: Behavior normal.    ED Results / Procedures / Treatments   Labs (all labs ordered are listed, but only abnormal results are displayed) Labs Reviewed  URINALYSIS, ROUTINE W REFLEX MICROSCOPIC - Abnormal; Notable for the following components:      Result Value   Color, Urine STRAW (*)    pH 9.0 (*)  Protein, ur 30 (*)    All other components within normal limits  URINE CULTURE    EKG None   Radiology No results found.  Procedures Procedures (including critical care time)  Medications Ordered in ED Medications - No data to display  ED Course  I have reviewed the triage vital signs and the nursing notes.  Pertinent labs & imaging results that were available during my care of the patient were reviewed by me and considered in my medical decision making (see chart for details).    MDM Rules/Calculators/A&P                         Patient presents to the ED with complaints of abdominal pain & urinary retention.  Prior to my assessment foley catheter was placed by nursing staff with 700 ccs of output and symptomatic relief. Patient nontoxic, tachycardic & hypertensive but seems to be improving S/p catheter placement.   Additional history obtained:  Additional history obtained from EMS. Previous records obtained and reviewed.   Lab Tests:  I Ordered, reviewed, and interpreted labs, which included:  UA: Proteinuria, no UTI.   Patient w/ resolution of sxs, nontender abdomen, doubt acute surgical intra-abdominal/pelvic process. Unknown definitive etiology to acute urinary retention, foley catheter draining well in the ED. Will discharge home with foley catheter & urology follow up.  In terms of his hypertension and tachycardia, these have gradually improved in the emergency department, he does remain fairly hypertensive, recommended PCP follow-up for this.  In terms of fall yesterday, no signs of serious head, neck, or back injury.  No midline spinal tenderness or acute neurologic deficits.  No chest/abdominal tenderness to palpation.  His left hip has good range of motion and is not significantly tender, we discussed the option of an x-ray which he declined, I feel this is reasonable given he has been ambulatory, he is neurovascularly intact distally.  Will discharge home with PCP and urology follow-up. I discussed results, treatment plan, need for follow-up, and return precautions  with the patient. Provided opportunity for questions, patient confirmed understanding and is in agreement with plan.   Findings and plan of care discussed with supervising physician Dr. Preston Fleeting who has evaluated patient & is in agreement.   Blood pressure (!) 181/102, pulse (!) 101, temperature 98 F (36.7 C), temperature source Oral, resp. rate 20, height 5\' 10"  (1.778 m), weight 83.9 kg, SpO2 94 %.  Portions of this note were generated with . Dictation errors may occur despite best attempts at proofreading.  Final Clinical Impression(s) / ED Diagnoses Final diagnoses:  Acute urinary retention    Rx / DC Orders ED Discharge Orders    None       Scientist, clinical (histocompatibility and immunogenetics), PA-C 06/06/20 0016    14/05/21, MD 06/06/20 [phone]

## 2020-06-05 NOTE — ED Provider Notes (Incomplete)
74 year old male presents retention.  Foley catheter is placed draining clear urine.  He patient  will be referred to urology.

## 2020-06-05 NOTE — ED Triage Notes (Signed)
Pt BIB EMS from home c/o dysuria and pain in lower abdomen that began this morning. EMS reports pt fell on his hip yesterday, but wasn't able to provide much information about the fall.  200 mcg Fentanyl given by EMS 18G LAC

## 2020-06-06 ENCOUNTER — Emergency Department (HOSPITAL_COMMUNITY): Payer: Medicare PPO

## 2020-06-06 ENCOUNTER — Emergency Department (HOSPITAL_COMMUNITY)
Admission: EM | Admit: 2020-06-06 | Discharge: 2020-06-06 | Disposition: A | Discharge status: Home / Self Care | Payer: Medicare PPO | Source: Home / Self Care | Attending: Emergency Medicine | Admitting: Emergency Medicine

## 2020-06-06 DIAGNOSIS — Z7901 Long term (current) use of anticoagulants: Secondary | ICD-10-CM | POA: Insufficient documentation

## 2020-06-06 DIAGNOSIS — I10 Essential (primary) hypertension: Secondary | ICD-10-CM

## 2020-06-06 DIAGNOSIS — R0602 Shortness of breath: Secondary | ICD-10-CM | POA: Insufficient documentation

## 2020-06-06 DIAGNOSIS — K5900 Constipation, unspecified: Secondary | ICD-10-CM | POA: Insufficient documentation

## 2020-06-06 DIAGNOSIS — T50995A Adverse effect of other drugs, medicaments and biological substances, initial encounter: Secondary | ICD-10-CM | POA: Insufficient documentation

## 2020-06-06 DIAGNOSIS — T50905A Adverse effect of unspecified drugs, medicaments and biological substances, initial encounter: Secondary | ICD-10-CM

## 2020-06-06 DIAGNOSIS — Z79899 Other long term (current) drug therapy: Secondary | ICD-10-CM | POA: Insufficient documentation

## 2020-06-06 DIAGNOSIS — R000 Tachycardia, unspecified: Secondary | ICD-10-CM | POA: Insufficient documentation

## 2020-06-06 DIAGNOSIS — R339 Retention of urine, unspecified: Secondary | ICD-10-CM | POA: Insufficient documentation

## 2020-06-06 DIAGNOSIS — Z7982 Long term (current) use of aspirin: Secondary | ICD-10-CM | POA: Insufficient documentation

## 2020-06-06 LAB — CBC WITH DIFFERENTIAL/PLATELET
Abs Immature Granulocytes: 0.04 10*3/uL (ref 0.00–0.07)
Basophils Absolute: 0.1 10*3/uL (ref 0.0–0.1)
Basophils Relative: 1 %
Eosinophils Absolute: 0.1 10*3/uL (ref 0.0–0.5)
Eosinophils Relative: 1 %
HCT: 43.4 % (ref 39.0–52.0)
Hemoglobin: 14 g/dL (ref 13.0–17.0)
Immature Granulocytes: 0 %
Lymphocytes Relative: 25 %
Lymphs Abs: 2.7 10*3/uL (ref 0.7–4.0)
MCH: 31.7 pg (ref 26.0–34.0)
MCHC: 32.3 g/dL (ref 30.0–36.0)
MCV: 98.2 fL (ref 80.0–100.0)
Monocytes Absolute: 1.6 10*3/uL — ABNORMAL HIGH (ref 0.1–1.0)
Monocytes Relative: 15 %
Neutro Abs: 6.5 10*3/uL (ref 1.7–7.7)
Neutrophils Relative %: 58 %
Platelets: 217 10*3/uL (ref 150–400)
RBC: 4.42 MIL/uL (ref 4.22–5.81)
RDW: 13.1 % (ref 11.5–15.5)
WBC: 11.1 10*3/uL — ABNORMAL HIGH (ref 4.0–10.5)
nRBC: 0 % (ref 0.0–0.2)

## 2020-06-06 LAB — COMPREHENSIVE METABOLIC PANEL
ALT: 20 U/L (ref 0–44)
AST: 30 U/L (ref 15–41)
Albumin: 3.9 g/dL (ref 3.5–5.0)
Alkaline Phosphatase: 67 U/L (ref 38–126)
Anion gap: 12 (ref 5–15)
BUN: 6 mg/dL — ABNORMAL LOW (ref 8–23)
CO2: 26 mmol/L (ref 22–32)
Calcium: 10.1 mg/dL (ref 8.9–10.3)
Chloride: 99 mmol/L (ref 98–111)
Creatinine, Ser: 0.77 mg/dL (ref 0.61–1.24)
GFR, Estimated: >60 mL/min (ref >60)
Glucose, Bld: 106 mg/dL — ABNORMAL HIGH (ref 70–99)
Potassium: 3.1 mmol/L — ABNORMAL LOW (ref 3.5–5.1)
Sodium: 137 mmol/L (ref 135–145)
Total Bilirubin: 1.2 mg/dL (ref 0.3–1.2)
Total Protein: 7 g/dL (ref 6.5–8.1)

## 2020-06-06 LAB — URINALYSIS, ROUTINE W REFLEX MICROSCOPIC
Bacteria, UA: NONE SEEN
Bilirubin Urine: NEGATIVE
Glucose, UA: NEGATIVE mg/dL
Ketones, ur: NEGATIVE mg/dL
Nitrite: NEGATIVE
Protein, ur: 30 mg/dL — AB
Specific Gravity, Urine: 1.009 (ref 1.005–1.030)
pH: 7 (ref 5.0–8.0)

## 2020-06-06 LAB — PROTIME-INR
INR: 1 (ref 0.8–1.2)
Prothrombin Time: 13.2 seconds (ref 11.4–15.2)

## 2020-06-06 LAB — LACTIC ACID, PLASMA: Lactic Acid, Venous: 1.2 mmol/L (ref 0.5–1.9)

## 2020-06-06 LAB — APTT: aPTT: 27 seconds (ref 24–36)

## 2020-06-06 MED ORDER — ATENOLOL 50 MG PO TABS
50.0000 mg | ORAL_TABLET | ORAL | Status: AC
  Administered 2020-06-06: 50 mg via ORAL
  Filled 2020-06-06: qty 1

## 2020-06-06 MED ORDER — TAMSULOSIN HCL 0.4 MG PO CAPS
0.4000 mg | ORAL_CAPSULE | ORAL | Status: AC
  Administered 2020-06-06: 0.4 mg via ORAL
  Filled 2020-06-06: qty 1

## 2020-06-06 MED ORDER — DOXAZOSIN MESYLATE 8 MG PO TABS
8.0000 mg | ORAL_TABLET | Freq: Every day | ORAL | Status: DC
  Administered 2020-06-06: 8 mg via ORAL
  Filled 2020-06-06: qty 1

## 2020-06-06 MED ORDER — MAGNESIUM CITRATE PO SOLN
1.0000 | Freq: Once | ORAL | Status: AC
  Administered 2020-06-06: 1 via ORAL
  Filled 2020-06-06: qty 296

## 2020-06-06 MED ORDER — LISINOPRIL 20 MG PO TABS
40.0000 mg | ORAL_TABLET | ORAL | Status: AC
  Administered 2020-06-06: 40 mg via ORAL
  Filled 2020-06-06: qty 2

## 2020-06-06 MED ORDER — AMLODIPINE BESYLATE 5 MG PO TABS
5.0000 mg | ORAL_TABLET | ORAL | Status: AC
  Administered 2020-06-06: 5 mg via ORAL
  Filled 2020-06-06: qty 1

## 2020-06-06 MED ORDER — LACTATED RINGERS IV SOLN: INTRAVENOUS | Status: DC

## 2020-06-06 MED ORDER — SODIUM CHLORIDE 0.9 % IV SOLN
1.0000 g | INTRAVENOUS | Status: DC
  Administered 2020-06-06: 1 g via INTRAVENOUS
  Filled 2020-06-06: qty 10

## 2020-06-06 MED ORDER — SENNA 8.6 MG PO TABS: 1.0000 | ORAL_TABLET | Freq: Every day | ORAL | 0 refills | Status: DC

## 2020-06-06 MED ORDER — TAMSULOSIN HCL 0.4 MG PO CAPS: 0.4000 mg | ORAL_CAPSULE | Freq: Every day | ORAL | 0 refills | Status: DC

## 2020-06-06 MED ORDER — CLONIDINE HCL 0.2 MG PO TABS
0.2000 mg | ORAL_TABLET | ORAL | Status: AC
  Administered 2020-06-06: 0.2 mg via ORAL
  Filled 2020-06-06: qty 1

## 2020-06-06 NOTE — ED Triage Notes (Signed)
Patient BIB EMS for urine retention, pain, and spasms. Had episode of shaking this morning. Was seen yesterday at [facility] for retention and foley was placed. 100 mcg fentanyl by EMS and 500 ml NS. Patient is alert, oriented, and  hypertensive.

## 2020-06-06 NOTE — Discharge Instructions (Addendum)
You were seen in the emergency department today for abdominal pain and inability to urinate.  A Foley catheter was placed to help with this problem.  We would like you to keep this in place at all times until you have followed up with the urology clinic.  Please call first thing Monday morning for a follow-up appointment Monday or Tuesday. Your urine had some mild protein in it- please have this rechecked as well.   Your blood pressure was noted to be elevated in the emergency department, your heart rate was also mildly elevated, please be sure to take your blood pressure medications and follow-up closely with your primary care provider within 1 week for this.  Return to the emergency department for any new or worsening symptoms including but not limited to return of abdominal pain, inability to urinate, problems with your catheter, fever, or any other concerns.

## 2020-06-06 NOTE — ED Provider Notes (Addendum)
[facility] EMERGENCY DEPARTMENT Provider Note   CSN: [id number] Arrival date & time: 06/06/20  1245     History Chief Complaint  Patient presents with  . Urinary Retention    Brian Gallegos is a 74 y.o. male.  HPI   This patient is a 74 year old male, he has a history of multiple prior back surgeries with chronic back pain, history of hypertension and hypercholesterolemia.  Of note this patient was seen yesterday evening with urinary retention and had a Foley catheter placed.  It seemed unremarkable and it seemed uncomplicated and is patient's completely resolved with the placement of the catheter.  It was placed to a leg bag which she has been emptying most recently 3 hours ago.  He complains today of having chills, he was tachycardic for EMS and mildly tachypneic feeling short of breath at the time, he has not had a measured temperature over 99.1 at home and reports that he has had some constipation secondary the pain medication he was given yesterday.  There was a report for the paramedics there was some hallucinations though the patient is completely lucid and answering my questions at this time.  No medications were given prehospital.  The patient denies of any increasing abdominal pain just burning around the penis where it is catheter is placed as well as a feeling of bladder spasm for which she was taking methocarbamol yesterday.  He denies any significant coughing outside of the norm, no swelling of the legs, no rashes to the skin, no blood in the stool, no sore throat, no headache.  Paramedics placed an IV prehospital and started IV fluids, heart rate was noted to be 130  Review of the medical record from yesterday shows that the patient was both tachycardic and severely hypertensive prior to catheter placement both of which improved and almost normalized after catheter placement  Past Medical History:  Diagnosis Date  . Arthritis    "all over"  . Chronic  lower back pain   . Early cataracts, bilateral   . Family history of adverse reaction to anesthesia    "son had ligament repair of elbow in 2016; woke up nauseated and disoriented"  . Hypercholesterolemia   . Hypertension   . Migraine    "none in years" (10/12/2015)  . Restless leg syndrome   . Wears glasses     Patient Active Problem List   Diagnosis Date Noted  . Post PTCA 10/12/2015  . Abnormal cardiovascular stress test 10/11/2015  . Atypical chest pain 10/11/2015    Past Surgical History:  Procedure Laterality Date  . ANTERIOR CERVICAL DECOMP/DISCECTOMY FUSION  ~ 2005  . ANTERIOR CERVICAL DISCECTOMY    . BACK SURGERY    . CARDIAC CATHETERIZATION N/A 10/12/2015   Procedure: Left Heart Cath and Coronary Angiography;  Surgeon: Yates Decamp, MD;  Location: [facility] INVASIVE CV LAB;  Service: Cardiovascular;  Laterality: N/A;  . CARDIAC CATHETERIZATION  10/12/2015   Procedure: Coronary Stent Intervention;  Surgeon: Yates Decamp, MD;  Location: [facility] INVASIVE CV LAB;  Service: Cardiovascular;;  . COLONOSCOPY    . CORONARY ANGIOPLASTY  10/12/2015   DES RCA  . EYE SURGERY    . FRACTURE SURGERY    . GLAUCOMA SURGERY Left 03/2015  . JOINT REPLACEMENT    . KNEE ARTHROSCOPY Bilateral   . LUMBAR DISC SURGERY  ~ 1986-02/08/2007 X 6  . MAXIMUM ACCESS (MAS)POSTERIOR LUMBAR INTERBODY FUSION (PLIF) 3 LEVEL  02/08/2007   w/rods and screws  .  SPINAL CORD STIMULATOR INSERTION N/A 05/08/2014   Procedure: LUMBAR SPINAL CORD STIMULATOR IMPLANT ;  Surgeon: Gwynne EdingerPaul C Harkins, MD;  Location: [facility] NEURO ORS;  Service: Neurosurgery;  Laterality: N/A;  . TOE FUSION Right 06/2014   "outside part of big toe"  . TOTAL KNEE ARTHROPLASTY Left ~ 2011       Family History  Problem Relation Age of Onset  . Aneurysm Mother   . Heart attack Father   . Hypertension Father   . Cancer Other     Social History   Tobacco Use  . Smoking status: Never Smoker  . Smokeless tobacco: Never Used  Vaping Use  . Vaping Use: Never  used  Substance Use Topics  . Alcohol use: Not Currently  . Drug use: No    Home Medications Prior to Admission medications   Medication Sig Start Date End Date Taking? Authorizing Provider  amitriptyline (ELAVIL) 25 MG tablet Take 25 mg by mouth at bedtime.    Yes [provider]  amLODipine (NORVASC) 5 MG tablet TAKE 1 TABLET(5 MG) BY MOUTH EVERY EVENING Patient taking differently: Take 2.5 mg by mouth daily.  04/12/20  Yes Tolia, Sunit, DO  aspirin EC 81 MG tablet Take 81 mg by mouth daily.   Yes [provider]  atenolol (TENORMIN) 50 MG tablet TAKE 1 TABLET BY MOUTH DAILY. HOLD IF SYSTOLIC BLOOD PRESSURE IS LESS THEN 100MMHG OR IF HEART RATE IS LESS THAN 60 BPM Patient taking differently: Take 50 mg by mouth daily.  04/12/20  Yes Tolia, Sunit, DO  Cholecalciferol 50 MCG (2000 UT) CAPS Take 2,000 Units by mouth daily.    Yes [provider]  cloNIDine (CATAPRES) 0.2 MG tablet Take 0.2 mg by mouth 2 (two) times daily.   Yes [provider]  diclofenac sodium (VOLTAREN) 1 % GEL Apply 4 g topically 4 (four) times daily. Patient taking differently: Apply 4 g topically 4 (four) times daily as needed (pain).  10/17/17  Yes Kerrin ChampagneNitka, James E, MD  doxazosin (CARDURA) 8 MG tablet Take 8 mg by mouth daily.   Yes [provider]  DULoxetine (CYMBALTA) 20 MG capsule Take 1 capsule (20 mg total) by mouth daily. 04/01/20  Yes Kerrin ChampagneNitka, James E, MD  furosemide (LASIX) 20 MG tablet Take 1 tablet (20 mg total) by mouth in the morning. 12/30/19 06/06/20 Yes Tolia, Sunit, DO  gabapentin (NEURONTIN) 300 MG capsule TAKE 1 CAPSULE BY MOUTH THREE TIMES DAILY Patient taking differently: Take 300 mg by mouth at bedtime.  02/18/19  Yes Kerrin ChampagneNitka, James E, MD  lisinopril (ZESTRIL) 40 MG tablet Take 1 tablet (40 mg total) by mouth in the morning. 12/30/19 06/06/20 Yes Tolia, Sunit, DO  methocarbamol (ROBAXIN) 750 MG tablet Take 750 mg by mouth at bedtime.    Yes [provider]    Oxycodone HCl 10 MG TABS Take 10 mg by mouth once as needed (pain).   Yes [provider]  potassium chloride (KLOR-CON) 10 MEQ tablet TAKE 1 TABLET BY MOUTH DAILY Patient taking differently: Take 10 mEq by mouth daily.  05/12/20  Yes Tolia, Sunit, DO  pyridoxine (B-6) 100 MG tablet Take 100 mg by mouth daily.   Yes [provider]  rOPINIRole (REQUIP) 0.5 MG tablet Take 1.5 mg by mouth at bedtime.    Yes [provider]  rosuvastatin (CRESTOR) 40 MG tablet Take 40 mg by mouth daily.    Yes [provider]  thiamine (VITAMIN B-1) 100 MG tablet  Take 100 mg by mouth daily.   Yes [provider]  vitamin B-12 (CYANOCOBALAMIN) 1000 MCG tablet Take 1,000 mcg by mouth daily.    Yes [provider]  senna (SENOKOT) 8.6 MG TABS tablet Take 1 tablet (8.6 mg total) by mouth daily. Until having soft daily stools 06/06/20   Eber Hong, MD  tamsulosin (FLOMAX) 0.4 MG CAPS capsule Take 1 capsule (0.4 mg total) by mouth daily for 14 days. 06/06/20 06/20/20  Eber Hong, MD    Allergies    Codeine  Review of Systems   Review of Systems  All other systems reviewed and are negative.   Physical Exam Updated Vital Signs BP (!) 194/96   Pulse (!) 122   Temp 98 F (36.7 C) (Axillary)   Resp 14   Ht 1.753 m ( )   Wt 81.6 kg   SpO2 97%   BMI 26.58 kg/m   Physical Exam Vitals and nursing note reviewed.  Constitutional:      General: He is not in acute distress.    Appearance: He is well-developed.  HENT:     Head: Normocephalic and atraumatic.     Mouth/Throat:     Pharynx: No oropharyngeal exudate.  Eyes:     General: No scleral icterus.       Right eye: No discharge.        Left eye: No discharge.     Conjunctiva/sclera: Conjunctivae normal.     Pupils: Pupils are equal, round, and reactive to light.  Neck:     Thyroid: No thyromegaly.     Vascular: No JVD.  Cardiovascular:     Rate and Rhythm: Regular rhythm. Tachycardia  present.     Heart sounds: Normal heart sounds. No murmur heard.  No friction rub. No gallop.   Pulmonary:     Effort: Pulmonary effort is normal. No respiratory distress.     Breath sounds: Normal breath sounds. No wheezing or rales.  Abdominal:     General: Bowel sounds are normal. There is no distension.     Palpations: Abdomen is soft. There is no mass.     Tenderness: There is no abdominal tenderness.  Genitourinary:    Comments: Catheter placed in the urethral meatus, no signs of drainage or discharge, there is some urine leaking around the tip Musculoskeletal:        General: No tenderness. Normal range of motion.     Cervical back: Normal range of motion and neck supple.  Lymphadenopathy:     Cervical: No cervical adenopathy.  Skin:    General: Skin is warm and dry.     Findings: No erythema or rash.  Neurological:     Mental Status: He is alert.     Coordination: Coordination normal.     Comments: Awake alert and able to follow commands, no confusion  Psychiatric:        Behavior: Behavior normal.     ED Results / Procedures / Treatments   Labs (all labs ordered are listed, but only abnormal results are displayed) Labs Reviewed  COMPREHENSIVE METABOLIC PANEL - Abnormal; Notable for the following components:      Result Value   Potassium 3.1 (*)    Glucose, Bld 106 (*)    BUN 6 (*)    All other components within normal limits  CBC WITH DIFFERENTIAL/PLATELET - Abnormal; Notable for the following components:   WBC 11.1 (*)    Monocytes Absolute 1.6 (*)    All  other components within normal limits  URINALYSIS, ROUTINE W REFLEX MICROSCOPIC - Abnormal; Notable for the following components:   Hgb urine dipstick MODERATE (*)    Protein, ur 30 (*)    Leukocytes,Ua SMALL (*)    All other components within normal limits  CULTURE, BLOOD (ROUTINE X 2)  CULTURE, BLOOD (ROUTINE X 2)  URINE CULTURE  LACTIC ACID, PLASMA  PROTIME-INR  APTT    EKG EKG  Interpretation  Date/Time:  Sunday June 06 2020 12:55:15 EST Ventricular Rate:  125 PR Interval:    QRS Duration: 105 QT Interval:  296 QTC Calculation: 427 R Axis:   10 Text Interpretation: Sinus tachycardia Left ventricular hypertrophy Anterior infarct, old Artifact in lead(s) I II aVR Since last tracing rate faster Confirmed by Eber Hong ([id number]) on 06/06/2020 2:53:17 PM   Radiology DG Chest Port 1 View  Result Date: 06/06/2020 CLINICAL DATA:  Questionable sepsis. EXAM: PORTABLE CHEST 1 VIEW COMPARISON:  05/03/2015. FINDINGS: Cardiac silhouette is normal in size. No mediastinal or hilar masses. Clear lungs.  No convincing pleural effusion and no pneumothorax. Skeletal structures are grossly intact. IMPRESSION: No active disease. Electronically Signed   By: Amie Portland M.D.   On: 06/06/2020 13:16    Procedures Procedures (including critical care time)  Medications Ordered in ED Medications  amLODipine (NORVASC) tablet 5 mg (5 mg Oral Given 06/06/20 1346)  atenolol (TENORMIN) tablet 50 mg (50 mg Oral Given 06/06/20 1346)  cloNIDine (CATAPRES) tablet 0.2 mg (0.2 mg Oral Given 06/06/20 1345)  lisinopril (ZESTRIL) tablet 40 mg (40 mg Oral Given 06/06/20 1345)  magnesium citrate solution 1 Bottle (1 Bottle Oral Given 06/06/20 1624)  tamsulosin (FLOMAX) capsule 0.4 mg (0.4 mg Oral Given 06/06/20 1623)    ED Course  I have reviewed the triage vital signs and the nursing notes.  Pertinent labs & imaging results that were available during my care of the patient were reviewed by me and considered in my medical decision making (see chart for details).  Clinical Course as of Jun 07 808  Wynelle Link Jun 06, 2020  1308 This patient denies any new medications, states that the reason that he came to the hospital today was because he was having some hallucinations, states that his children told him that he was having difficulty texting them things that made sense and was talking to his girlfriend  who lives in [place] like she was in the room with him.  He does not feel the symptoms now and I do not detect any hallucinations at the bedside.  His heart rate is down to 115, he has not had his blood pressure medication today this will be given at this time   [BM]  1558 Blood pressure improved, currently 159/94, tachycardia is improving, the patient is back to normal mental status, stable for discharge at this time   [BM]    Clinical Course User Index [BM] Eber Hong, MD   MDM Rules/Calculators/A&P                          Patient has had chills, tachycardic, code sepsis activated.  He is hypertensive at 160/120, will get a rectal temperature, labs, chest x-ray, urinalysis.  The rest of the labs are fairly unremarkable, potassium of 3.1, urinalysis with no ketones, negative for infection, lactic acid is 1.2, CBC without leukocytosis of any concern, cultures are pending but at this time I feel like this is more likely substance related.  The patient son is now arrived and states that he took several oxycodone last night which she has been on for quite some time after weaning himself off for his chronic back pain.  He then took another 1 this morning with between 4 and 6 Robaxin tablets.  He will need to finish several hours of observation, get IV fluids, he is completely back to normal with his mental status, the son states that he is back to his normal self and lives with him and is happy to take him home to observe at home.  The patient is requesting something for constipation after getting multiple doses of fentanyl over the last couple of days through transport to the hospital.  He will be given magnesium citrate  You will be prescribed Flomax for home.  He is agreeable to the plan.  He has no fever.  Change of shift - care signed out to Dr. Particia Nearing  Final Clinical Impression(s) / ED Diagnoses Final diagnoses:  Medication side effect, initial encounter  Hypertension, unspecified  type      Eber Hong, MD 06/06/20 1516    Eber Hong, MD 06/07/20 [phone]

## 2020-06-06 NOTE — Discharge Instructions (Addendum)
Please make sure that you are taking your blood pressure medications exactly as prescribed, I have added a prescription for your constipation as well as a tablet called Flomax which you should take once a day for the next 2 weeks.  I would encourage you to follow-up with the urologist within 1 week.  They can take the catheter out if it is the right time.  Your blood work and urine test and chest x-ray today were all reassuring.  I suspect that your symptoms today with the confusion and hallucinations were related to the medications that you have taken including the methocarbamol and oxycodone.  Please totally avoid these medications altogether.  Return to the emergency department for severe worsening symptoms

## 2020-06-07 LAB — URINE CULTURE
Culture: NO GROWTH
Culture: NO GROWTH

## 2020-06-11 LAB — CULTURE, BLOOD (ROUTINE X 2)
Culture: NO GROWTH
Culture: NO GROWTH

## 2020-06-14 ENCOUNTER — Ambulatory Visit (INDEPENDENT_AMBULATORY_CARE_PROVIDER_SITE_OTHER): Payer: Medicare PPO | Admitting: Urology

## 2020-06-14 ENCOUNTER — Other Ambulatory Visit: Payer: Self-pay

## 2020-06-14 VITALS — BP 124/68 | HR 74 | Temp 97.9°F

## 2020-06-14 DIAGNOSIS — N5201 Erectile dysfunction due to arterial insufficiency: Secondary | ICD-10-CM | POA: Diagnosis not present

## 2020-06-14 DIAGNOSIS — N138 Other obstructive and reflux uropathy: Secondary | ICD-10-CM | POA: Insufficient documentation

## 2020-06-14 DIAGNOSIS — N401 Enlarged prostate with lower urinary tract symptoms: Secondary | ICD-10-CM

## 2020-06-14 DIAGNOSIS — R339 Retention of urine, unspecified: Secondary | ICD-10-CM | POA: Insufficient documentation

## 2020-06-14 MED ORDER — TAMSULOSIN HCL 0.4 MG PO CAPS
0.4000 mg | ORAL_CAPSULE | Freq: Every day | ORAL | 3 refills | Status: DC
Start: 2020-06-14 — End: 2020-07-30

## 2020-06-14 NOTE — Progress Notes (Signed)
Fill and Pull Catheter Removal  Patient is present today for a catheter removal.  Patient was cleaned and prepped in a sterile fashion of sterile water/ saline was instilled into the bladder when the patient felt the urge to urinate. 33ml of water was then drained from the balloon.  A 16FR foley cath was removed from the bladder no complications were noted .  Patient as then given some time to void on their own.  Patient can void  on their own after some time.  Patient tolerated well.  Performed by: Dalia Heading, LPN Uroflow  Peak Flow: 33ml Average Flow: 81ml Voided Volume: Voiding Time: 42sec Flow Time: 42sec Time to Peak Flow: 19sec

## 2020-06-14 NOTE — Progress Notes (Signed)
06/14/2020 11:55 AM   [name] 1946/02/28 [id number]  Referring provider: Sigmund Hazel, MD [address] [place],  [place] [address]  Urinary retention  HPI: Mr Strojny is a 74yo here for evaluation of urinary retention. He presented to the ER on 12/4 and had a foley placed for urinary retention. For 3 years he has had a weaker stream, urinary hesitancy, nocturia 3-5x, starting/stopping of his urinary stream, and dribbling. He denies dysuria or hematuria. He was placed on flomax in the ER.  He has issues getting and maintaining an erection. He was tried on viagra and cialis which failed to improve his ability to get an erection. He has lost 35lbs in the past year after his wife passed away.     PMH: Past Medical History:  Diagnosis Date  . Arthritis    "all over"  . Chronic lower back pain   . Early cataracts, bilateral   . Family history of adverse reaction to anesthesia    "son had ligament repair of elbow in 2016; woke up nauseated and disoriented"  . Hypercholesterolemia   . Hypertension   . Migraine    "none in years" (10/12/2015)  . Restless leg syndrome   . Wears glasses     Surgical History: Past Surgical History:  Procedure Laterality Date  . ANTERIOR CERVICAL DECOMP/DISCECTOMY FUSION  ~ 2005  . ANTERIOR CERVICAL DISCECTOMY    . BACK SURGERY    . CARDIAC CATHETERIZATION N/A 10/12/2015   Procedure: Left Heart Cath and Coronary Angiography;  Surgeon: Yates Decamp, MD;  Location: [facility] INVASIVE CV LAB;  Service: Cardiovascular;  Laterality: N/A;  . CARDIAC CATHETERIZATION  10/12/2015   Procedure: Coronary Stent Intervention;  Surgeon: Yates Decamp, MD;  Location: [facility] INVASIVE CV LAB;  Service: Cardiovascular;;  . COLONOSCOPY    . CORONARY ANGIOPLASTY  10/12/2015   DES RCA  . EYE SURGERY    . FRACTURE SURGERY    . GLAUCOMA SURGERY Left 03/2015  . JOINT REPLACEMENT    . KNEE ARTHROSCOPY Bilateral   . LUMBAR DISC SURGERY  ~ 1986-02/08/2007 X 6  . MAXIMUM ACCESS  (MAS)POSTERIOR LUMBAR INTERBODY FUSION (PLIF) 3 LEVEL  02/08/2007   w/rods and screws  . SPINAL CORD STIMULATOR INSERTION N/A 05/08/2014   Procedure: LUMBAR SPINAL CORD STIMULATOR IMPLANT ;  Surgeon: Gwynne Edinger, MD;  Location: [facility] NEURO ORS;  Service: Neurosurgery;  Laterality: N/A;  . TOE FUSION Right 06/2014   "outside part of big toe"  . TOTAL KNEE ARTHROPLASTY Left ~ 2011    Home Medications:  Allergies as of 06/14/2020      Reactions   Codeine Itching   Low amount is tolerated      Medication List       Accurate as of June 14, 2020 11:55 AM. If you have any questions, ask your nurse or doctor.        amitriptyline 25 MG tablet Commonly known as: ELAVIL Take 25 mg by mouth at bedtime.   amLODipine 5 MG tablet Commonly known as: NORVASC TAKE 1 TABLET(5 MG) BY MOUTH EVERY EVENING What changed: See the new instructions.   aspirin EC 81 MG tablet Take 81 mg by mouth daily.   atenolol 50 MG tablet Commonly known as: TENORMIN TAKE 1 TABLET BY MOUTH DAILY. HOLD IF SYSTOLIC BLOOD PRESSURE IS LESS THEN OR IF HEART RATE IS LESS THAN 60 BPM What changed: See the new instructions.   Cholecalciferol 50 MCG (2000 UT) Caps Take  2,000 Units by mouth daily.   cloNIDine 0.2 MG tablet Commonly known as: CATAPRES Take 0.2 mg by mouth 2 (two) times daily.   diclofenac sodium 1 % Gel Commonly known as: VOLTAREN Apply 4 g topically 4 (four) times daily. What changed:   when to take this  reasons to take this   doxazosin 8 MG tablet Commonly known as: CARDURA Take 8 mg by mouth daily.   DULoxetine 20 MG capsule Commonly known as: Cymbalta Take 1 capsule (20 mg total) by mouth daily.   furosemide 20 MG tablet Commonly known as: LASIX Take 1 tablet (20 mg total) by mouth in the morning.   gabapentin 300 MG capsule Commonly known as: NEURONTIN TAKE 1 CAPSULE BY MOUTH THREE TIMES DAILY What changed: when to take this   lisinopril 40 MG tablet Commonly  known as: ZESTRIL Take 1 tablet (40 mg total) by mouth in the morning.   methocarbamol 750 MG tablet Commonly known as: ROBAXIN Take 750 mg by mouth at bedtime.   Oxycodone HCl 10 MG Tabs Take 10 mg by mouth once as needed (pain).   potassium chloride 10 MEQ tablet Commonly known as: KLOR-CON TAKE 1 TABLET BY MOUTH DAILY   pyridoxine 100 MG tablet Commonly known as: B-6 Take 100 mg by mouth daily.   rOPINIRole 0.5 MG tablet Commonly known as: REQUIP Take 1.5 mg by mouth at bedtime.   rosuvastatin 40 MG tablet Commonly known as: CRESTOR Take 40 mg by mouth daily.   senna 8.6 MG Tabs tablet Commonly known as: SENOKOT Take 1 tablet (8.6 mg total) by mouth daily. Until having soft daily stools   tamsulosin 0.4 MG Caps capsule Commonly known as: FLOMAX Take 1 capsule (0.4 mg total) by mouth daily for 14 days.   thiamine 100 MG tablet Commonly known as: Vitamin B-1 Take 100 mg by mouth daily.   vitamin B-12 1000 MCG tablet Commonly known as: CYANOCOBALAMIN Take 1,000 mcg by mouth daily.       Allergies:  Allergies  Allergen Reactions  . Codeine Itching    Low amount is tolerated    Family History: Family History  Problem Relation Age of Onset  . Aneurysm Mother   . Heart attack Father   . Hypertension Father   . Cancer Other     Social History:  reports that he has never smoked. He has never used smokeless tobacco. He reports previous alcohol use. He reports that he does not use drugs.  ROS: All other review of systems were reviewed and are negative except what is noted above in HPI  Physical Exam: BP 124/68   Pulse 74   Temp 97.9 F (36.6 C)   Constitutional:  Alert and oriented, No acute distress. HEENT: [place] AT, moist mucus membranes.  Trachea midline, no masses. Cardiovascular: No clubbing, cyanosis, or edema. Respiratory: Normal respiratory effort, no increased work of breathing. GI: Abdomen is soft, nontender, nondistended, no abdominal  masses GU: No CVA tenderness.  Lymph: No cervical or inguinal lymphadenopathy. Skin: No rashes, bruises or suspicious lesions. Neurologic: Grossly intact, no focal deficits, moving all 4 extremities. Psychiatric: Normal mood and affect.  Laboratory Data: Lab Results  Component Value Date   WBC 11.1 (H) 06/06/2020   HGB 14.0 06/06/2020   HCT 43.4 06/06/2020   MCV 98.2 06/06/2020   PLT 217 06/06/2020    Lab Results  Component Value Date   CREATININE 0.77 06/06/2020    No results found for: PSA  No  results found for: TESTOSTERONE  Lab Results  Component Value Date   HGBA1C  04/20/2010    5.6 (NOTE)                                                                       According to the ADA Clinical Practice Recommendations for 2011, when HbA1c is used as a screening test:   >=6.5%   Diagnostic of Diabetes Mellitus           (if abnormal result  is confirmed)  5.7-6.4%   Increased risk of developing Diabetes Mellitus  References:Diagnosis and Classification of Diabetes Mellitus,Diabetes Care,2011,34(Suppl 1):S62-S69 and Standards of Medical Care in         Diabetes - 2011,Diabetes Care,2011,34  (Suppl 1):S11-S61.    Urinalysis    Component Value Date/Time   COLORURINE YELLOW 06/06/2020 1403   APPEARANCEUR CLEAR 06/06/2020 1403   LABSPEC 1.009 06/06/2020 1403   PHURINE 7.0 06/06/2020 1403   GLUCOSEU NEGATIVE 06/06/2020 1403   HGBUR MODERATE (A) 06/06/2020 1403   BILIRUBINUR NEGATIVE 06/06/2020 1403   KETONESUR NEGATIVE 06/06/2020 1403   PROTEINUR 30 (A) 06/06/2020 1403   UROBILINOGEN 0.2 04/21/2010 1203   NITRITE NEGATIVE 06/06/2020 1403   LEUKOCYTESUR SMALL (A) 06/06/2020 1403    Lab Results  Component Value Date   BACTERIA NONE SEEN 06/06/2020    Pertinent Imaging:  No results found for this or any previous visit.  No results found for this or any previous visit.  No results found for this or any previous visit.  No results found for this or any previous  visit.  No results found for this or any previous visit.  No results found for this or any previous visit.  No results found for this or any previous visit.  No results found for this or any previous visit.   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 Urinary retention -continue flomax 0.4mg   3. Erectile dysfunction -testosterone free and total  No follow-ups on file.  Wilkie Aye, MD  [facility] [place]

## 2020-06-14 NOTE — Progress Notes (Signed)
Urological Symptom Review  Patient is experiencing the following symptoms: Burning/pain with urination Get up at night to urinate Stream starts and stops Trouble starting stream Weak stream   Review of Systems  Gastrointestinal (upper)  : Negative for upper GI symptoms  Gastrointestinal (lower) : Negative for lower GI symptoms  Constitutional : Weight loss  Skin: Negative for skin symptoms  Eyes: Negative for eye symptoms  Ear/Nose/Throat : Negative for Ear/Nose/Throat symptoms  Hematologic/Lymphatic: Negative for Hematologic/Lymphatic symptoms  Cardiovascular : Negative for cardiovascular symptoms  Respiratory : Negative for respiratory symptoms  Endocrine: Negative for endocrine symptoms  Musculoskeletal: Back pain Joint pain  Neurological: Negative for neurological symptoms  Psychologic: Negative for psychiatric symptoms

## 2020-06-15 ENCOUNTER — Other Ambulatory Visit: Payer: Medicare PPO

## 2020-06-15 ENCOUNTER — Encounter: Payer: Self-pay | Admitting: Urology

## 2020-06-15 DIAGNOSIS — N5201 Erectile dysfunction due to arterial insufficiency: Secondary | ICD-10-CM

## 2020-06-15 NOTE — Patient Instructions (Signed)

## 2020-06-16 LAB — TESTOSTERONE,FREE AND TOTAL
Testosterone, Free: 5.8 pg/mL — ABNORMAL LOW (ref 6.6–18.1)
Testosterone: 611 ng/dL (ref 264–916)

## 2020-06-19 ENCOUNTER — Other Ambulatory Visit: Payer: Self-pay | Admitting: Cardiology

## 2020-06-19 DIAGNOSIS — I119 Hypertensive heart disease without heart failure: Secondary | ICD-10-CM

## 2020-06-22 ENCOUNTER — Other Ambulatory Visit: Payer: Self-pay | Admitting: Specialist

## 2020-06-30 NOTE — Progress Notes (Signed)
Results sent via my chart 

## 2020-07-01 ENCOUNTER — Ambulatory Visit: Payer: Medicare PPO | Admitting: Specialist

## 2020-07-24 ENCOUNTER — Other Ambulatory Visit: Payer: Self-pay | Admitting: Specialist

## 2020-07-30 ENCOUNTER — Encounter: Payer: Self-pay | Admitting: Urology

## 2020-07-30 ENCOUNTER — Other Ambulatory Visit: Payer: Self-pay

## 2020-07-30 ENCOUNTER — Ambulatory Visit (INDEPENDENT_AMBULATORY_CARE_PROVIDER_SITE_OTHER): Payer: Medicare PPO | Admitting: Urology

## 2020-07-30 VITALS — BP 125/69 | HR 68 | Temp 98.0°F | Ht 69.0 in | Wt 184.0 lb

## 2020-07-30 DIAGNOSIS — N401 Enlarged prostate with lower urinary tract symptoms: Secondary | ICD-10-CM

## 2020-07-30 DIAGNOSIS — R339 Retention of urine, unspecified: Secondary | ICD-10-CM

## 2020-07-30 DIAGNOSIS — N138 Other obstructive and reflux uropathy: Secondary | ICD-10-CM

## 2020-07-30 DIAGNOSIS — N5201 Erectile dysfunction due to arterial insufficiency: Secondary | ICD-10-CM | POA: Diagnosis not present

## 2020-07-30 LAB — URINALYSIS, ROUTINE W REFLEX MICROSCOPIC
Bilirubin, UA: NEGATIVE
Glucose, UA: NEGATIVE
Ketones, UA: NEGATIVE
Leukocytes,UA: NEGATIVE
Nitrite, UA: NEGATIVE
Protein,UA: NEGATIVE
RBC, UA: NEGATIVE
Specific Gravity, UA: 1.01 (ref 1.005–1.030)
Urobilinogen, Ur: 0.2 mg/dL (ref 0.2–1.0)
pH, UA: 5 (ref 5.0–7.5)

## 2020-07-30 LAB — BLADDER SCAN AMB NON-IMAGING: Scan Result: 105

## 2020-07-30 MED ORDER — TAMSULOSIN HCL 0.4 MG PO CAPS
0.4000 mg | ORAL_CAPSULE | Freq: Every day | ORAL | 3 refills | Status: DC
Start: 2020-07-30 — End: 2021-01-28

## 2020-07-30 MED ORDER — TADALAFIL 20 MG PO TABS: 20.0000 mg | ORAL_TABLET | Freq: Every day | ORAL | 5 refills | Status: DC | PRN

## 2020-07-30 NOTE — Progress Notes (Signed)
07/30/2020 10:43 AM   Brian Gallegos [birth year] [id number]  Referring provider: Sigmund Hazel, MD [address] [place],  [place] [address]  Followup BPH and urinary retention  HPI: Mr Smeltzer is a 75yo here for followup for BPh and urinary retention. PVR 105cc. He is on flomax 0.4mg  daily. Stream strong. No urgency, no frequency, no hesitancy. No dribbling. Overall he is pleased with his urination.  He has erectile dysfunction and uses tadalafil prn with good results.    PMH: Past Medical History:  Diagnosis Date  . Arthritis    "all over"  . Chronic lower back pain   . Early cataracts, bilateral   . Family history of adverse reaction to anesthesia    "son had ligament repair of elbow in 2016; woke up nauseated and disoriented"  . Hypercholesterolemia   . Hypertension   . Migraine    "none in years" (10/12/2015)  . Restless leg syndrome   . Wears glasses     Surgical History: Past Surgical History:  Procedure Laterality Date  . ANTERIOR CERVICAL DECOMP/DISCECTOMY FUSION  ~ 2005  . ANTERIOR CERVICAL DISCECTOMY    . BACK SURGERY    . CARDIAC CATHETERIZATION N/A 10/12/2015   Procedure: Left Heart Cath and Coronary Angiography;  Surgeon: Yates Decamp, MD;  Location: [facility] INVASIVE CV LAB;  Service: Cardiovascular;  Laterality: N/A;  . CARDIAC CATHETERIZATION  10/12/2015   Procedure: Coronary Stent Intervention;  Surgeon: Yates Decamp, MD;  Location: [facility] INVASIVE CV LAB;  Service: Cardiovascular;;  . COLONOSCOPY    . CORONARY ANGIOPLASTY  10/12/2015   DES RCA  . EYE SURGERY    . FRACTURE SURGERY    . GLAUCOMA SURGERY Left 03/2015  . JOINT REPLACEMENT    . KNEE ARTHROSCOPY Bilateral   . LUMBAR DISC SURGERY  ~ 1986-02/08/2007 X 6  . MAXIMUM ACCESS (MAS)POSTERIOR LUMBAR INTERBODY FUSION (PLIF) 3 LEVEL  02/08/2007   w/rods and screws  . SPINAL CORD STIMULATOR INSERTION N/A 05/08/2014   Procedure: LUMBAR SPINAL CORD STIMULATOR IMPLANT ;  Surgeon: Gwynne Edinger, MD;  Location: [facility]  NEURO ORS;  Service: Neurosurgery;  Laterality: N/A;  . TOE FUSION Right 06/2014   "outside part of big toe"  . TOTAL KNEE ARTHROPLASTY Left ~ 2011    Home Medications:  Allergies as of 07/30/2020      Reactions   Morphine Sulfate Itching   Other Other (See Comments)   Codeine Itching   Low amount is tolerated      Medication List       Accurate as of July 30, 2020 10:43 AM. If you have any questions, ask your nurse or doctor.        amitriptyline 25 MG tablet Commonly known as: ELAVIL Take 25 mg by mouth at bedtime.   amLODipine 5 MG tablet Commonly known as: NORVASC TAKE 1/2 TABLET BY MOUTH DAILY   aspirin EC 81 MG tablet Take 81 mg by mouth daily.   atenolol 50 MG tablet Commonly known as: TENORMIN TAKE 1 TABLET BY MOUTH DAILY. HOLD IF SYSTOLIC BLOOD PRESSURE IS LESS THEN OR IF HEART RATE IS LESS THAN 60 BPM What changed: See the new instructions.   Cholecalciferol 50 MCG (2000 UT) Caps Take 2,000 Units by mouth daily.   cloNIDine 0.2 MG tablet Commonly known as: CATAPRES Take 0.2 mg by mouth 2 (two) times daily.   diclofenac Sodium 1 % Gel Commonly known as: VOLTAREN See admin instructions. What changed: Another medication with  the same name was changed. Make sure you understand how and when to take each.   diclofenac sodium 1 % Gel Commonly known as: VOLTAREN Apply 4 g topically 4 (four) times daily. What changed:   when to take this  reasons to take this   doxazosin 8 MG tablet Commonly known as: CARDURA Take 8 mg by mouth daily.   DULoxetine 20 MG capsule Commonly known as: CYMBALTA TAKE 1 CAPSULE(20 MG) BY MOUTH DAILY   furosemide 20 MG tablet Commonly known as: LASIX Take 1 tablet (20 mg total) by mouth in the morning.   gabapentin 300 MG capsule Commonly known as: NEURONTIN TAKE 1 CAPSULE BY MOUTH THREE TIMES DAILY What changed: when to take this   lisinopril 40 MG tablet Commonly known as: ZESTRIL 1 tablet    lisinopril 40 MG tablet Commonly known as: ZESTRIL Take 1 tablet (40 mg total) by mouth in the morning.   lubiprostone 8 MCG capsule Commonly known as: AMITIZA TAKE 1 CAPSULE BY MOUTH TWICE DAILY WITH FOOD AND WATER   methocarbamol 750 MG tablet Commonly known as: ROBAXIN Take 750 mg by mouth at bedtime.   Oxycodone HCl 10 MG Tabs Take 10 mg by mouth once as needed (pain).   potassium chloride 10 MEQ tablet Commonly known as: KLOR-CON TAKE 1 TABLET BY MOUTH DAILY   pyridoxine 100 MG tablet Commonly known as: B-6 Take 100 mg by mouth daily.   rOPINIRole 0.5 MG tablet Commonly known as: REQUIP Take 1.5 mg by mouth at bedtime.   rosuvastatin 40 MG tablet Commonly known as: CRESTOR Take 40 mg by mouth daily.   senna 8.6 MG Tabs tablet Commonly known as: SENOKOT Take 1 tablet (8.6 mg total) by mouth daily. Until having soft daily stools   tamsulosin 0.4 MG Caps capsule Commonly known as: FLOMAX Take 1 capsule (0.4 mg total) by mouth daily after supper.   thiamine 100 MG tablet Commonly known as: Vitamin B-1 Take 100 mg by mouth daily.   vitamin B-12 1000 MCG tablet Commonly known as: CYANOCOBALAMIN Take 1,000 mcg by mouth daily.   vitamin E 180 MG (400 UNITS) capsule 1 capsule       Allergies:  Allergies  Allergen Reactions  . Morphine Sulfate Itching  . Other Other (See Comments)  . Codeine Itching    Low amount is tolerated    Family History: Family History  Problem Relation Age of Onset  . Aneurysm Mother   . Heart attack Father   . Hypertension Father   . Cancer Other     Social History:  reports that he has never smoked. He has never used smokeless tobacco. He reports previous alcohol use. He reports that he does not use drugs.  ROS: All other review of systems were reviewed and are negative except what is noted above in HPI  Physical Exam: BP 125/69   Pulse 68   Temp 98 F (36.7 C)   Ht 5\' 9"  (1.753 m)   Wt 184 lb (83.5 kg)   BMI  27.17 kg/m   Constitutional:  Alert and oriented, No acute distress. HEENT: [place] AT, moist mucus membranes.  Trachea midline, no masses. Cardiovascular: No clubbing, cyanosis, or edema. Respiratory: Normal respiratory effort, no increased work of breathing. GI: Abdomen is soft, nontender, nondistended, no abdominal masses GU: No CVA tenderness.  Lymph: No cervical or inguinal lymphadenopathy. Skin: No rashes, bruises or suspicious lesions. Neurologic: Grossly intact, no focal deficits, moving all 4 extremities. Psychiatric: Normal  mood and affect.  Laboratory Data: Lab Results  Component Value Date   WBC 11.1 (H) 06/06/2020   HGB 14.0 06/06/2020   HCT 43.4 06/06/2020   MCV 98.2 06/06/2020   PLT 217 06/06/2020    Lab Results  Component Value Date   CREATININE 0.77 06/06/2020    No results found for: PSA  Lab Results  Component Value Date   TESTOSTERONE 611 06/15/2020    Lab Results  Component Value Date   HGBA1C  04/20/2010    5.6 (NOTE)                                                                       According to the ADA Clinical Practice Recommendations for 2011, when HbA1c is used as a screening test:   >=6.5%   Diagnostic of Diabetes Mellitus           (if abnormal result  is confirmed)  5.7-6.4%   Increased risk of developing Diabetes Mellitus  References:Diagnosis and Classification of Diabetes Mellitus,Diabetes Care,2011,34(Suppl 1):S62-S69 and Standards of Medical Care in         Diabetes - 2011,Diabetes Care,2011,34  (Suppl 1):S11-S61.    Urinalysis    Component Value Date/Time   COLORURINE YELLOW 06/06/2020 1403   APPEARANCEUR CLEAR 06/06/2020 1403   LABSPEC 1.009 06/06/2020 1403   PHURINE 7.0 06/06/2020 1403   GLUCOSEU NEGATIVE 06/06/2020 1403   HGBUR MODERATE (A) 06/06/2020 1403   BILIRUBINUR NEGATIVE 06/06/2020 1403   KETONESUR NEGATIVE 06/06/2020 1403   PROTEINUR 30 (A) 06/06/2020 1403   UROBILINOGEN 0.2 04/21/2010 1203   NITRITE NEGATIVE  06/06/2020 1403   LEUKOCYTESUR SMALL (A) 06/06/2020 1403    Lab Results  Component Value Date   BACTERIA NONE SEEN 06/06/2020    Pertinent Imaging:  No results found for this or any previous visit.  No results found for this or any previous visit.  No results found for this or any previous visit.  No results found for this or any previous visit.  No results found for this or any previous visit.  No results found for this or any previous visit.  No results found for this or any previous visit.  No results found for this or any previous visit.   Assessment & Plan:    1. Benign prostatic hyperplasia with urinary obstruction -Continue flomax - BLADDER SCAN AMB NON-IMAGING - Urinalysis, Routine w reflex microscopic  2. Urinary retention -Continue flomax 0.4mg  daily  3. Erectile dysfunction due to arterial insufficiency -Tadalafil 20mg  prn   No follow-ups on file.  , MD  [facility]

## 2020-07-30 NOTE — Progress Notes (Signed)
Urological Symptom Review  Patient is experiencing the following symptoms: Get up at night to urinate   Review of Systems  Gastrointestinal (upper)  : Negative for upper GI symptoms  Gastrointestinal (lower) : Negative for lower GI symptoms  Constitutional : Negative for symptoms  Skin: Negative for skin symptoms  Eyes: Negative for eye symptoms  Ear/Nose/Throat : Negative for Ear/Nose/Throat symptoms  Hematologic/Lymphatic: Negative for Hematologic/Lymphatic symptoms  Cardiovascular : Negative for cardiovascular symptoms  Respiratory : Negative for respiratory symptoms  Endocrine: Negative for endocrine symptoms  Musculoskeletal: Back pain Joint pain  Neurological: Negative for neurological symptoms  Psychologic: Negative for psychiatric symptoms 

## 2020-07-30 NOTE — Patient Instructions (Signed)

## 2020-10-21 ENCOUNTER — Other Ambulatory Visit: Payer: Self-pay

## 2020-10-21 DIAGNOSIS — I119 Hypertensive heart disease without heart failure: Secondary | ICD-10-CM

## 2020-10-21 MED ORDER — FUROSEMIDE 20 MG PO TABS: 20.0000 mg | ORAL_TABLET | Freq: Every morning | ORAL | 0 refills | Status: DC

## 2020-11-02 ENCOUNTER — Other Ambulatory Visit: Payer: Self-pay

## 2020-11-02 DIAGNOSIS — I119 Hypertensive heart disease without heart failure: Secondary | ICD-10-CM

## 2020-11-02 MED ORDER — FUROSEMIDE 20 MG PO TABS: 20.0000 mg | ORAL_TABLET | Freq: Every morning | ORAL | 0 refills | Status: DC

## 2020-11-11 ENCOUNTER — Ambulatory Visit: Payer: Medicare PPO | Admitting: Cardiology

## 2020-11-16 ENCOUNTER — Ambulatory Visit: Payer: Medicare PPO | Admitting: Cardiology

## 2020-12-11 ENCOUNTER — Other Ambulatory Visit: Payer: Self-pay | Admitting: Cardiology

## 2020-12-14 ENCOUNTER — Ambulatory Visit: Payer: Medicare PPO | Admitting: Cardiology

## 2020-12-21 ENCOUNTER — Encounter: Payer: Self-pay | Admitting: Cardiology

## 2020-12-21 ENCOUNTER — Other Ambulatory Visit: Payer: Self-pay

## 2020-12-21 ENCOUNTER — Ambulatory Visit: Payer: Medicare PPO | Admitting: Cardiology

## 2020-12-21 ENCOUNTER — Other Ambulatory Visit: Payer: Self-pay | Admitting: Cardiology

## 2020-12-21 VITALS — BP 122/72 | HR 59 | Temp 98.3°F | Resp 16 | Ht 69.0 in | Wt 195.0 lb

## 2020-12-21 DIAGNOSIS — E7800 Pure hypercholesterolemia, unspecified: Secondary | ICD-10-CM

## 2020-12-21 DIAGNOSIS — Z955 Presence of coronary angioplasty implant and graft: Secondary | ICD-10-CM

## 2020-12-21 DIAGNOSIS — I255 Ischemic cardiomyopathy: Secondary | ICD-10-CM

## 2020-12-21 DIAGNOSIS — I2510 Atherosclerotic heart disease of native coronary artery without angina pectoris: Secondary | ICD-10-CM

## 2020-12-21 DIAGNOSIS — I119 Hypertensive heart disease without heart failure: Secondary | ICD-10-CM

## 2020-12-21 MED ORDER — CLONIDINE HCL 0.1 MG PO TABS: 0.1000 mg | ORAL_TABLET | Freq: Every day | ORAL | 0 refills | Status: DC

## 2020-12-21 NOTE — Progress Notes (Signed)
Brian Gallegos Date of Birth: Apr 15, 1946 MRN: [id number] Primary Care Provider:Miller, Misty Stanley, MD Former Cardiology Providers: [place], APRN, FNP-C Primary Cardiologist: Tessa Lerner, DO, [facility] (established care 12/30/2019)  Date: 12/22/20 Last Office Visit: 05/19/2020   Chief Complaint  Patient presents with   Follow-up   Coronary Artery Disease    HPI  Brian Gallegos is a 75 y.o.  male who presents to the office with a chief complaint of " 6 month follow up for CAD." Patient's past medical history and cardiovascular risk factors include: Established coronary artery disease without angina pectoris, status post stenting in the RCA in April 2017, hypertension, hyperlipidemia, prediabetes, advanced age.  Patient presents for 75-month follow-up for evaluation and management of coronary artery disease.  Since last office visit he has not been hospitalized for congestive heart failure or CAD.  Patient's overall functional status remains stable and has not noticed a significant change in physical endurance.  Medications reconciled.  Patient is concerned that his blood pressures are quite soft in the recent past.  His last echocardiogram noted mildly reduced left ventricular systolic function with global hypokinesis and mild to moderate valvular heart disease.  And his nuclear stress test was noted to be intermediate risk due to reduced LVEF as per gated SPECT of 44%.  Overall the myocardial perfusion was reported to be normal.  ALLERGIES: Allergies  Allergen Reactions   Morphine Sulfate Itching   Other Other (See Comments)   Codeine Itching    Low amount is tolerated   MEDICATION LIST PRIOR TO VISIT: Current Outpatient Medications on File Prior to Visit  Medication Sig Dispense Refill   amitriptyline (ELAVIL) 25 MG tablet Take 25 mg by mouth at bedtime.      amLODipine (NORVASC) 5 MG tablet TAKE 1/2 TABLET BY MOUTH DAILY 45 tablet 2   aspirin EC 81 MG tablet Take 81 mg by  mouth daily.     atenolol (TENORMIN) 50 MG tablet TAKE 1 TABLET BY MOUTH DAILY. HOLD IF SYSTOLIC BLOOD PRESSURE IS LESS THEN OR IF HEART RATE IS LESS THAN 60 BPM (Patient taking differently: Take 50 mg by mouth daily.) 90 tablet 0   Cholecalciferol 50 MCG (2000 UT) CAPS Take 2,000 Units by mouth daily.      diclofenac sodium (VOLTAREN) 1 % GEL Apply 4 g topically 4 (four) times daily. (Patient taking differently: Apply 4 g topically 4 (four) times daily as needed (pain).) 5 Tube 1   doxazosin (CARDURA) 8 MG tablet Take 8 mg by mouth daily.     DULoxetine (CYMBALTA) 20 MG capsule TAKE 1 CAPSULE(20 MG) BY MOUTH DAILY 30 capsule 3   furosemide (LASIX) 20 MG tablet Take 1 tablet (20 mg total) by mouth in the morning. 90 tablet 0   gabapentin (NEURONTIN) 300 MG capsule TAKE 1 CAPSULE BY MOUTH THREE TIMES DAILY (Patient taking differently: Take 300 mg by mouth at bedtime.) 270 capsule 0   lisinopril (ZESTRIL) 40 MG tablet Take 1 tablet (40 mg total) by mouth in the morning. 90 tablet 0   lubiprostone (AMITIZA) 8 MCG capsule TAKE 1 CAPSULE BY MOUTH TWICE DAILY WITH FOOD AND WATER     methocarbamol (ROBAXIN) 750 MG tablet Take 750 mg by mouth at bedtime.     potassium chloride (KLOR-CON) 10 MEQ tablet TAKE 1 TABLET BY MOUTH DAILY 30 tablet 6   pyridoxine (B-6) 100 MG tablet Take 100 mg by mouth daily.     rOPINIRole (REQUIP) 0.5 MG tablet  Take 1.5 mg by mouth at bedtime.      rosuvastatin (CRESTOR) 40 MG tablet Take 40 mg by mouth daily.      senna (SENOKOT) 8.6 MG TABS tablet Take 1 tablet (8.6 mg total) by mouth daily. Until having soft daily stools 30 tablet 0   tadalafil (CIALIS) 20 MG tablet Take 1 tablet (20 mg total) by mouth daily as needed. 10 tablet 5   tamsulosin (FLOMAX) 0.4 MG CAPS capsule Take 1 capsule (0.4 mg total) by mouth daily after supper. 90 capsule 3   thiamine (VITAMIN B-1) 100 MG tablet Take 100 mg by mouth daily.     vitamin B-12 (CYANOCOBALAMIN) 1000 MCG tablet Take  1,000 mcg by mouth daily.      vitamin E 180 MG (400 UNITS) capsule 1 capsule     Current Facility-Administered Medications on File Prior to Visit  Medication Dose Route Frequency Provider Last Rate Last Admin   methylPREDNISolone acetate (DEPO-MEDROL) injection 80 mg  80 mg Intramuscular Once Naida Sleight, PA-C        PAST MEDICAL HISTORY: Past Medical History:  Diagnosis Date   Arthritis    "all over"   Chronic lower back pain    Early cataracts, bilateral    Family history of adverse reaction to anesthesia    "son had ligament repair of elbow in 2016; woke up nauseated and disoriented"   Hypercholesterolemia    Hypertension    Migraine    "none in years" (10/12/2015)   Restless leg syndrome    Wears glasses     PAST SURGICAL HISTORY: Past Surgical History:  Procedure Laterality Date   ANTERIOR CERVICAL DECOMP/DISCECTOMY FUSION  ~ 2005   ANTERIOR CERVICAL DISCECTOMY     BACK SURGERY     CARDIAC CATHETERIZATION N/A 10/12/2015   Procedure: Left Heart Cath and Coronary Angiography;  Surgeon: Yates Decamp, MD;  Location: [facility] INVASIVE CV LAB;  Service: Cardiovascular;  Laterality: N/A;   CARDIAC CATHETERIZATION  10/12/2015   Procedure: Coronary Stent Intervention;  Surgeon: Yates Decamp, MD;  Location: [facility] INVASIVE CV LAB;  Service: Cardiovascular;;   COLONOSCOPY     CORONARY ANGIOPLASTY  10/12/2015   DES RCA   EYE SURGERY     FRACTURE SURGERY     GLAUCOMA SURGERY Left 03/2015   JOINT REPLACEMENT     KNEE ARTHROSCOPY Bilateral    LUMBAR DISC SURGERY  ~ 1986-02/08/2007 X 6   MAXIMUM ACCESS (MAS)POSTERIOR LUMBAR INTERBODY FUSION (PLIF) 3 LEVEL  02/08/2007   w/rods and screws   SPINAL CORD STIMULATOR INSERTION N/A 05/08/2014   Procedure: LUMBAR SPINAL CORD STIMULATOR IMPLANT ;  Surgeon: Gwynne Edinger, MD;  Location: [facility] NEURO ORS;  Service: Neurosurgery;  Laterality: N/A;   TOE FUSION Right 06/2014   "outside part of big toe"   TOTAL KNEE ARTHROPLASTY Left ~ 2011    FAMILY  HISTORY: The patient's family history includes Aneurysm in his mother; Cancer in an other family member; Heart attack in his father; Hypertension in his father.   SOCIAL HISTORY:  The patient  reports that he has never smoked. He has never used smokeless tobacco. He reports previous alcohol use. He reports that he does not use drugs.  Review of Systems  Constitutional: Negative for chills and fever.  HENT:  Negative for hoarse voice and nosebleeds.   Eyes:  Negative for discharge, double vision and pain.  Cardiovascular:  Negative for chest pain, claudication, dyspnea on exertion, leg swelling, near-syncope, orthopnea, palpitations,  paroxysmal nocturnal dyspnea and syncope.  Respiratory:  Negative for hemoptysis and shortness of breath.   Musculoskeletal:  Negative for muscle cramps and myalgias.  Gastrointestinal:  Negative for abdominal pain, constipation, diarrhea, hematemesis, hematochezia, melena, nausea and vomiting.  Neurological:  Negative for dizziness and light-headedness.   PHYSICAL EXAM: Vitals with BMI 12/21/2020 07/30/2020 06/14/2020  Height 5\' 9"  5\' 9"  -  Weight 195 lbs 184 lbs -  BMI 28.78 27.16 -  Systolic 122 125 [id number]  Diastolic 72 69 68  Pulse 59 68 74    CONSTITUTIONAL: Well-developed and well-nourished. No acute distress.  SKIN: Skin is warm and dry. No rash noted. No cyanosis. No pallor. No jaundice HEAD: Normocephalic and atraumatic.  EYES: No scleral icterus MOUTH/THROAT: Moist oral membranes.  NECK: No JVD present. No thyromegaly noted. LYMPHATIC: No visible cervical adenopathy.  CHEST Normal respiratory effort. No intercostal retractions  LUNGS: Clear to auscultation bilaterally.  No stridor. No wheezes. No rales.  CARDIOVASCULAR: Regular rate and rhythm, positive S1-S2, no murmurs rubs or gallops appreciated. ABDOMINAL: Soft, nontender, nondistended, positive bowel sounds in all 4 quadrants.  No apparent ascites.  EXTREMITIES: No peripheral edema, warm to  touch bilaterally.  With 2+ dorsalis pedis and posterior tibial pulses. HEMATOLOGIC: No significant bruising NEUROLOGIC: Oriented to person, place, and time. Nonfocal. Normal muscle tone.  PSYCHIATRIC: Normal mood and affect. Normal behavior. Cooperative  CARDIAC DATABASE: EKG: 05/27/2019: Sinus bradycardia at 58 bpm, normal axis, PRWP cannot exclude anterior infarct old. No evidence of ischemia.  12/30/2019: Sinus  Bradycardia, 54bpm, without underlying injury pattern.  Echocardiogram: 01/19/2020: LVEF 45-50%, mild global hypokinesis, moderate LVH, grade 1 diastolic impairment, normal LAP, moderately dilated left atrium, mild AR, moderate MR.  Stress Testing:  Lexiscan/modified Bruce Tetrofosmin stress test 01/19/2020:  Lexiscan/modified Bruce nuclear stress test performed using 1-day protocol.  Stress EKG is non-diagnostic, as this is pharmacological stress test. 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  Normal myocardial perfusion. Stress LVEF calculated 44%, although visually appears normal.  Intermediate risk study due to reduced LVEF. Recommend clinical correlation.   Heart Catheterization: Coronary angiogram 10/12/2015: Normal LV systolic function. Moderate disease in the mid circumflex coronary artery of 50%. Diffuse coronary calcification involving the proximal LAD. Right coronary artery very large and dominant. Proximal segment has ulcerated 90% stenosis. Mild amount of calcification is also evident. Stenting proximal large dominant RCA for an ulcerated 90% stenosis to 0% with implantation of a 4.0 x 18 mm resolute DES.  Vascular imaging: Abdominal aortic duplex 05/30/2017: Focal plaque noted in the proximal, mid and distal aorta. The maximum aorta diameter is 2.82 cm (dist). No AAA. Normal iliac velocity.  LABORATORY DATA: CBC Latest Ref Rng & Units 06/06/2020 04/29/2014 04/22/2010  WBC 4.0 - 10.5 K/uL  11.1(H) 6.5 13.3(H)  Hemoglobin 13.0 - 17.0 g/dL 09.614.0 04.513.0 4.0(J9.7(L)  Hematocrit 39.0 - 52.0 % 43.4 38.4(L) 29.5(L)  Platelets 150 - 400 K/uL 217 210 146(L)    CMP Latest Ref Rng & Units 06/06/2020 01/06/2020 04/29/2014  Glucose 70 - 99 mg/dL 811(B106(H) 147(W106(H) 295(A106(H)  BUN 8 - 23 mg/dL 6(L) 11 10  Creatinine 0.61 - 1.24 mg/dL 2.130.77 0.860.80 5.780.92  Sodium 135 - 145 mmol/L 137 137 138  Potassium 3.5 - 5.1 mmol/L 3.1(L) 3.9 4.1  Chloride 98 - 111 mmol/L 99 99 99  CO2 22 - 32 mmol/L 26 22 27   Calcium 8.9 - 10.3 mg/dL 46.910.1 9.7 9.3  Total Protein  6.5 - 8.1 g/dL 7.0 - -  Total Bilirubin 0.3 - 1.2 mg/dL 1.2 - -  Alkaline Phos 38 - 126 U/L 67 - -  AST 15 - 41 U/L 30 - -  ALT 0 - 44 U/L 20 - -    Lipid Panel  No results found for: CHOL, TRIG, HDL, CHOLHDL, VLDL, LDLCALC, LDLDIRECT, LABVLDL  Lab Results  Component Value Date   HGBA1C  04/20/2010    5.6 (NOTE)                                                                       According to the ADA Clinical Practice Recommendations for 2011, when HbA1c is used as a screening test:   >=6.5%   Diagnostic of Diabetes Mellitus           (if abnormal result  is confirmed)  5.7-6.4%   Increased risk of developing Diabetes Mellitus  References:Diagnosis and Classification of Diabetes Mellitus,Diabetes Care,2011,34(Suppl 1):S62-S69 and Standards of Medical Care in         Diabetes - 2011,Diabetes Care,2011,34  (Suppl 1):S11-S61.   No components found for: NTPROBNP No results found for: TSH  Cardiac Panel (last 3 results) No results for input(s): CKTOTAL, CKMB, TROPONINIHS, RELINDX in the last 72 hours.  IMPRESSION:    ICD-10-CM   1. Atherosclerosis of native coronary artery of native heart without angina pectoris  I25.10 EKG 12-Lead    2. History of coronary angioplasty with insertion of stent  Z95.5     3. Ischemic cardiomyopathy  I25.5 Basic metabolic panel    Magnesium    Pro b natriuretic peptide (BNP)    4. Hypertension with heart disease  I11.9      5. Hypercholesterolemia  E78.00        RECOMMENDATIONS: BRINTON BRANDEL is a 75 y.o. male whose past medical history and cardiovascular risk factors include: Established coronary artery disease without angina pectoris, status post stenting in the RCA in April 2017, hypertension, hyperlipidemia, prediabetes, advanced age.  Established coronary artery disease without angina pectoris and prior PCI: Recently had an ischemic evaluation including an echo and stress test.  Results noted above.   Currently asymptomatic with regard to anginal symptoms. Medications reconciled.  Ischemic cardiomyopathy: Last LVEF 45%. Currently not on GDMT. Recommend weaning off of clonidine as he already has a soft blood pressures. In 3 days prior to the next office visit he is asked to stop lisinopril. Tentatively planning to initiate Entresto at the next office visit.  Benign essential hypertension: Office blood pressures are very well controlled.   Currently managed by his providers at the [facility] hospital.   Recommend a low-salt diet. Patient is asked to keep a log of his blood pressures and call the office if he has systolic blood pressure greater than 140 mmHg consistently. Wean off clonidine Week 1: Clonidine 0.2 mg p.o. daily. Week 2: Clonidine 0.1 mg p.o. daily Week 3: Clonidine 0.1 mg p.o. every other day Week for: Stop clonidine Hold lisinopril 3 days before the next office visit and have blood work done prior to coming in.  Hypercholesterolemia: Continue statin therapy.   Follow lipids. Currently managed by primary care provider. Patient denies myalgia or other side effects.  FINAL MEDICATION LIST END OF ENCOUNTER: Meds ordered this encounter  Medications   DISCONTD: cloNIDine (CATAPRES) 0.1 MG tablet    Sig: Take 1 tablet (0.1 mg total) by mouth daily.    Dispense:  30 tablet    Refill:  0    Medications Discontinued During This Encounter  Medication Reason   diclofenac Sodium  (VOLTAREN) 1 % GEL Duplicate   lisinopril (ZESTRIL) 40 MG tablet Duplicate   Oxycodone HCl 10 MG TABS Error   cloNIDine (CATAPRES) 0.2 MG tablet Reorder     Current Outpatient Medications:    amitriptyline (ELAVIL) 25 MG tablet, Take 25 mg by mouth at bedtime. , Disp: , Rfl:    amLODipine (NORVASC) 5 MG tablet, TAKE 1/2 TABLET BY MOUTH DAILY, Disp: 45 tablet, Rfl: 2   aspirin EC 81 MG tablet, Take 81 mg by mouth daily., Disp: , Rfl:    atenolol (TENORMIN) 50 MG tablet, TAKE 1 TABLET BY MOUTH DAILY. HOLD IF SYSTOLIC BLOOD PRESSURE IS LESS THEN OR IF HEART RATE IS LESS THAN 60 BPM (Patient taking differently: Take 50 mg by mouth daily.), Disp: 90 tablet, Rfl: 0   Cholecalciferol 50 MCG (2000 UT) CAPS, Take 2,000 Units by mouth daily. , Disp: , Rfl:    diclofenac sodium (VOLTAREN) 1 % GEL, Apply 4 g topically 4 (four) times daily. (Patient taking differently: Apply 4 g topically 4 (four) times daily as needed (pain).), Disp: 5 Tube, Rfl: 1   doxazosin (CARDURA) 8 MG tablet, Take 8 mg by mouth daily., Disp: , Rfl:    DULoxetine (CYMBALTA) 20 MG capsule, TAKE 1 CAPSULE(20 MG) BY MOUTH DAILY, Disp: 30 capsule, Rfl: 3   furosemide (LASIX) 20 MG tablet, Take 1 tablet (20 mg total) by mouth in the morning., Disp: 90 tablet, Rfl: 0   gabapentin (NEURONTIN) 300 MG capsule, TAKE 1 CAPSULE BY MOUTH THREE TIMES DAILY (Patient taking differently: Take 300 mg by mouth at bedtime.), Disp: 270 capsule, Rfl: 0   lisinopril (ZESTRIL) 40 MG tablet, Take 1 tablet (40 mg total) by mouth in the morning., Disp: 90 tablet, Rfl: 0   lubiprostone (AMITIZA) 8 MCG capsule, TAKE 1 CAPSULE BY MOUTH TWICE DAILY WITH FOOD AND WATER, Disp: , Rfl:    methocarbamol (ROBAXIN) 750 MG tablet, Take 750 mg by mouth at bedtime., Disp: , Rfl:    potassium chloride (KLOR-CON) 10 MEQ tablet, TAKE 1 TABLET BY MOUTH DAILY, Disp: 30 tablet, Rfl: 6   pyridoxine (B-6) 100 MG tablet, Take 100 mg by mouth daily., Disp: , Rfl:     rOPINIRole (REQUIP) 0.5 MG tablet, Take 1.5 mg by mouth at bedtime. , Disp: , Rfl:    rosuvastatin (CRESTOR) 40 MG tablet, Take 40 mg by mouth daily. , Disp: , Rfl:    senna (SENOKOT) 8.6 MG TABS tablet, Take 1 tablet (8.6 mg total) by mouth daily. Until having soft daily stools, Disp: 30 tablet, Rfl: 0   tadalafil (CIALIS) 20 MG tablet, Take 1 tablet (20 mg total) by mouth daily as needed., Disp: 10 tablet, Rfl: 5   tamsulosin (FLOMAX) 0.4 MG CAPS capsule, Take 1 capsule (0.4 mg total) by mouth daily after supper., Disp: 90 capsule, Rfl: 3   thiamine (VITAMIN B-1) 100 MG tablet, Take 100 mg by mouth daily., Disp: , Rfl:    vitamin B-12 (CYANOCOBALAMIN) 1000 MCG tablet, Take 1,000 mcg by mouth daily. , Disp: , Rfl:    vitamin E 180 MG (400 UNITS) capsule, 1 capsule,  Disp: , Rfl:    cloNIDine (CATAPRES) 0.1 MG tablet, TAKE 1 TABLET(0.1 MG) BY MOUTH DAILY, Disp: 90 tablet, Rfl: 0  Current Facility-Administered Medications:    methylPREDNISolone acetate (DEPO-MEDROL) injection 80 mg, 80 mg, Intramuscular, Once, Zonia Kief M, PA-C  Orders Placed This Encounter  Procedures   Basic metabolic panel   Magnesium   Pro b natriuretic peptide (BNP)   EKG 12-Lead    --Continue cardiac medications as reconciled in final medication list. --Return in about 5 weeks (around 01/25/2021) for Follow up cardiomyopathy. . Or sooner if needed. --Continue follow-up with your primary care physician regarding the management of your other chronic comorbid conditions.  Patient's questions and concerns were addressed to his satisfaction. He voices understanding of the instructions provided during this encounter.   This note was created using a voice recognition software as a result there may be grammatical errors inadvertently enclosed that do not reflect the nature of this encounter. Every attempt is made to correct such errors.   Tessa Lerner, [place], [facility]  Pager: [phone] Office: [phone]

## 2020-12-26 ENCOUNTER — Telehealth: Payer: Self-pay | Admitting: Student

## 2020-12-26 DIAGNOSIS — R42 Dizziness and giddiness: Secondary | ICD-10-CM

## 2020-12-26 DIAGNOSIS — I10 Essential (primary) hypertension: Secondary | ICD-10-CM

## 2020-12-26 NOTE — Telephone Encounter (Signed)
ON-CALL CARDIOLOGY 12/26/20  Patient's name: Brian Gallegos.   MRN: [id number].    DOB: 10-05-1945 Primary care provider: Sigmund Hazel, MD. Primary cardiologist: Dr. Ihor Austin regarding this patient's care today: Patient called concern as he was at the movie theater in [place] and was walking when he became dizzy. He leaned up against a wall and subsequently left his "knees give out". Patient recalls the entire event and dose not believe he lost consciousness and nor does his friend who witnessed the event. He did not hit his head and recovered quickly. He drank some water and was able to walk independently back to his car. Patient reports just prior to the event he had eaten a large lunch and had not had any water today. At the time of call patient is feeling well without active symptoms. Patient does report he has had similar episodes of dizziness in the past but his "knees did not give out."  Patient was last seen by Dr. Odis Hollingshead 6 days ago and is in the process of weaning off clonidine, presently taking 0.2 mg once daily. Unfortunately patient was unable to check blood pressure at the time of event or phone call.   Impression:   ICD-10-CM   1. Dizziness  R42     2. Essential hypertension  I10       No orders of the defined types were placed in this encounter.   No orders of the defined types were placed in this encounter.   Recommendations: Suspect patient was dehydrated vs. Vasovagal event. Advised watchful waiting. Counseled patient regarding signs and symptoms that would warrant urgent/emergent evaluation, he verbalized understanding and agreement. Patient will check is BP as soon as he is able and notify our office if it is <110 mmHg systolic or >140 mmHg systolic.   Patient will continue to wean off clonidine and keep follow up with Dr. Odis Hollingshead in 4 weeks.   Telephone encounter total time: 8 minutes     Rayford Halsted, PA-C 12/26/2020, 2:54 PM Office:  [phone]

## 2020-12-27 NOTE — Telephone Encounter (Signed)
Tried calling patient no answer left a vm to call back

## 2020-12-28 NOTE — Telephone Encounter (Signed)
Spoke to patient he state he is doing much better he has not felt dizzy since then and he actually went to check his knee and was told it was fine and was given a steroid shot but overall patient states his symptoms have improved his bp today was 122/72

## 2020-12-30 ENCOUNTER — Other Ambulatory Visit: Payer: Self-pay

## 2020-12-30 DIAGNOSIS — I255 Ischemic cardiomyopathy: Secondary | ICD-10-CM

## 2020-12-31 NOTE — Telephone Encounter (Signed)
Thank you for follow up.  

## 2021-01-07 ENCOUNTER — Ambulatory Visit: Payer: Medicare PPO | Admitting: Specialist

## 2021-01-25 ENCOUNTER — Ambulatory Visit: Payer: Medicare PPO | Admitting: Cardiology

## 2021-01-25 DIAGNOSIS — Z955 Presence of coronary angioplasty implant and graft: Secondary | ICD-10-CM

## 2021-01-25 DIAGNOSIS — I2510 Atherosclerotic heart disease of native coronary artery without angina pectoris: Secondary | ICD-10-CM

## 2021-01-25 DIAGNOSIS — I119 Hypertensive heart disease without heart failure: Secondary | ICD-10-CM

## 2021-01-25 DIAGNOSIS — E7800 Pure hypercholesterolemia, unspecified: Secondary | ICD-10-CM

## 2021-01-25 DIAGNOSIS — I255 Ischemic cardiomyopathy: Secondary | ICD-10-CM

## 2021-01-28 ENCOUNTER — Other Ambulatory Visit: Payer: Self-pay

## 2021-01-28 ENCOUNTER — Encounter: Payer: Self-pay | Admitting: Urology

## 2021-01-28 ENCOUNTER — Telehealth: Payer: Self-pay

## 2021-01-28 ENCOUNTER — Ambulatory Visit: Payer: Medicare PPO | Admitting: Urology

## 2021-01-28 VITALS — BP 137/64 | HR 66

## 2021-01-28 DIAGNOSIS — N401 Enlarged prostate with lower urinary tract symptoms: Secondary | ICD-10-CM

## 2021-01-28 DIAGNOSIS — N5201 Erectile dysfunction due to arterial insufficiency: Secondary | ICD-10-CM | POA: Diagnosis not present

## 2021-01-28 DIAGNOSIS — N138 Other obstructive and reflux uropathy: Secondary | ICD-10-CM

## 2021-01-28 DIAGNOSIS — R339 Retention of urine, unspecified: Secondary | ICD-10-CM

## 2021-01-28 LAB — URINALYSIS, ROUTINE W REFLEX MICROSCOPIC
Bilirubin, UA: NEGATIVE
Glucose, UA: NEGATIVE
Nitrite, UA: POSITIVE — AB
Protein,UA: NEGATIVE
RBC, UA: NEGATIVE
Specific Gravity, UA: 1.02 (ref 1.005–1.030)
Urobilinogen, Ur: 0.2 mg/dL (ref 0.2–1.0)
pH, UA: 5.5 (ref 5.0–7.5)

## 2021-01-28 LAB — MICROSCOPIC EXAMINATION
RBC, Urine: NONE SEEN /hpf (ref 0–2)
Renal Epithel, UA: NONE SEEN /hpf

## 2021-01-28 MED ORDER — TAMSULOSIN HCL 0.4 MG PO CAPS: 0.4000 mg | ORAL_CAPSULE | Freq: Every day | ORAL | 3 refills | Status: DC

## 2021-01-28 MED ORDER — VARDENAFIL HCL 20 MG PO TABS
20.0000 mg | ORAL_TABLET | Freq: Every day | ORAL | 0 refills | Status: DC | PRN
Start: 2021-01-28 — End: 2021-02-01

## 2021-01-28 MED ORDER — VARDENAFIL HCL 20 MG PO TABS: 20.0000 mg | ORAL_TABLET | Freq: Every day | ORAL | 0 refills | Status: DC | PRN

## 2021-01-28 NOTE — Progress Notes (Signed)
post void residual=55 Urological Symptom Review  Patient is experiencing the following symptoms: none   Review of Systems  Gastrointestinal (upper)  : Negative for upper GI symptoms  Gastrointestinal (lower) : Negative for lower GI symptoms  Constitutional : Negative for symptoms  Skin: Negative for skin symptoms  Eyes: Negative for eye symptoms  Ear/Nose/Throat : Negative for Ear/Nose/Throat symptoms  Hematologic/Lymphatic: Negative for Hematologic/Lymphatic symptoms  Cardiovascular : Negative for cardiovascular symptoms  Respiratory : Negative for respiratory symptoms  Endocrine: Negative for endocrine symptoms  Musculoskeletal: Back pain Joint pain  Neurological: Negative for neurological symptoms  Psychologic: Negative

## 2021-01-28 NOTE — Telephone Encounter (Signed)
Patient called back to let Dr. Ronne Binning know the Rx prescirbed today cost too much.  Would like the Rx resent to: [facility] [address] - [employer], [place]

## 2021-01-28 NOTE — Progress Notes (Signed)
01/28/2021 10:05 AM   Brian Gallegos 01-25-1946 [id number]  Referring provider: Sigmund Hazel, MD [address] [place],  [place] [address]  Followup BPH and erectile dysfunction   HPI: Brian Gallegos is a 74yo here for followup for BPH and ED. He remains on flomax 0.4mg  daily,IPSS 4 QOL 0. Nocturia 1x. He is happy with his urination. He takes tadalafil 20mg  with mixed results. He cannot maintain an erection for longer than 5 minutes.    PMH: Past Medical History:  Diagnosis Date   Arthritis    "all over"   Chronic lower back pain    Early cataracts, bilateral    Family history of adverse reaction to anesthesia    "son had ligament repair of elbow in 2016; woke up nauseated and disoriented"   Hypercholesterolemia    Hypertension    Migraine    "none in years" (10/12/2015)   Restless leg syndrome    Wears glasses     Surgical History: Past Surgical History:  Procedure Laterality Date   ANTERIOR CERVICAL DECOMP/DISCECTOMY FUSION  ~ 2005   ANTERIOR CERVICAL DISCECTOMY     BACK SURGERY     CARDIAC CATHETERIZATION N/A 10/12/2015   Procedure: Left Heart Cath and Coronary Angiography;  Surgeon: 12/12/2015, MD;  Location: [facility] INVASIVE CV LAB;  Service: Cardiovascular;  Laterality: N/A;   CARDIAC CATHETERIZATION  10/12/2015   Procedure: Coronary Stent Intervention;  Surgeon: 12/12/2015, MD;  Location: [facility] INVASIVE CV LAB;  Service: Cardiovascular;;   COLONOSCOPY     CORONARY ANGIOPLASTY  10/12/2015   DES RCA   EYE SURGERY     FRACTURE SURGERY     GLAUCOMA SURGERY Left 03/2015   JOINT REPLACEMENT     KNEE ARTHROSCOPY Bilateral    LUMBAR DISC SURGERY  ~ 1986-02/08/2007 X 6   MAXIMUM ACCESS (MAS)POSTERIOR LUMBAR INTERBODY FUSION (PLIF) 3 LEVEL  02/08/2007   w/rods and screws   SPINAL CORD STIMULATOR INSERTION N/A 05/08/2014   Procedure: LUMBAR SPINAL CORD STIMULATOR IMPLANT ;  Surgeon: 13/12/2013, MD;  Location: [facility] NEURO ORS;  Service: Neurosurgery;  Laterality: N/A;   TOE  FUSION Right 06/2014   "outside part of big toe"   TOTAL KNEE ARTHROPLASTY Left ~ 2011    Home Medications:  Allergies as of 01/28/2021       Reactions   Bisacodyl Other (See Comments)   Morphine Itching   Morphine Sulfate Itching   Other Other (See Comments)   Codeine Itching   Low amount is tolerated   Finasteride Hives, Rash        Medication List        Accurate as of January 28, 2021 10:05 AM. If you have any questions, ask your nurse or doctor.          amitriptyline 25 MG tablet Commonly known as: ELAVIL Take 25 mg by mouth at bedtime.   amLODipine 5 MG tablet Commonly known as: NORVASC TAKE 1/2 TABLET BY MOUTH DAILY   aspirin EC 81 MG tablet Take 81 mg by mouth daily.   atenolol 50 MG tablet Commonly known as: TENORMIN TAKE 1 TABLET BY MOUTH DAILY. HOLD IF SYSTOLIC BLOOD PRESSURE IS LESS THEN January 30, 2021 OR IF HEART RATE IS LESS THAN 60 BPM What changed: See the new instructions.   Cholecalciferol 50 MCG (2000 UT) Caps Take 2,000 Units by mouth daily.   cloNIDine 0.1 MG tablet Commonly known as: CATAPRES TAKE 1 TABLET(0.1 MG) BY MOUTH DAILY   diclofenac  sodium 1 % Gel Commonly known as: VOLTAREN Apply 4 g topically 4 (four) times daily. What changed:  when to take this reasons to take this   doxazosin 8 MG tablet Commonly known as: CARDURA Take 8 mg by mouth daily.   DULoxetine 20 MG capsule Commonly known as: CYMBALTA TAKE 1 CAPSULE(20 MG) BY MOUTH DAILY   furosemide 20 MG tablet Commonly known as: LASIX Take 1 tablet (20 mg total) by mouth in the morning.   gabapentin 300 MG capsule Commonly known as: NEURONTIN TAKE 1 CAPSULE BY MOUTH THREE TIMES DAILY What changed: when to take this   lisinopril 40 MG tablet Commonly known as: ZESTRIL Take 1 tablet (40 mg total) by mouth in the morning.   lubiprostone 8 MCG capsule Commonly known as: AMITIZA TAKE 1 CAPSULE BY MOUTH TWICE DAILY WITH FOOD AND WATER   methocarbamol 750 MG  tablet Commonly known as: ROBAXIN Take 750 mg by mouth at bedtime.   potassium chloride 10 MEQ tablet Commonly known as: KLOR-CON TAKE 1 TABLET BY MOUTH DAILY   pyridoxine 100 MG tablet Commonly known as: B-6 Take 100 mg by mouth daily.   rOPINIRole 0.5 MG tablet Commonly known as: REQUIP Take 1.5 mg by mouth at bedtime.   rosuvastatin 40 MG tablet Commonly known as: CRESTOR Take 40 mg by mouth daily.   senna 8.6 MG Tabs tablet Commonly known as: SENOKOT Take 1 tablet (8.6 mg total) by mouth daily. Until having soft daily stools   tadalafil 20 MG tablet Commonly known as: CIALIS Take 1 tablet (20 mg total) by mouth daily as needed.   tamsulosin 0.4 MG Caps capsule Commonly known as: FLOMAX Take 1 capsule (0.4 mg total) by mouth daily after supper.   thiamine 100 MG tablet Commonly known as: Vitamin B-1 Take 100 mg by mouth daily.   vitamin B-12 1000 MCG tablet Commonly known as: CYANOCOBALAMIN Take 1,000 mcg by mouth daily.   vitamin E 180 MG (400 UNITS) capsule 1 capsule        Allergies:  Allergies  Allergen Reactions   Bisacodyl Other (See Comments)   Morphine Itching   Morphine Sulfate Itching   Other Other (See Comments)   Codeine Itching    Low amount is tolerated   Finasteride Hives and Rash    Family History: Family History  Problem Relation Age of Onset   Aneurysm Mother    Heart attack Father    Hypertension Father    Cancer Other     Social History:  reports that he has never smoked. He has never used smokeless tobacco. He reports previous alcohol use. He reports that he does not use drugs.  ROS: All other review of systems were reviewed and are negative except what is noted above in HPI  Physical Exam: BP 137/64   Pulse 66   Constitutional:  Alert and oriented, No acute distress. HEENT: [place] AT, moist mucus membranes.  Trachea midline, no masses. Cardiovascular: No clubbing, cyanosis, or edema. Respiratory: Normal respiratory  effort, no increased work of breathing. GI: Abdomen is soft, nontender, nondistended, no abdominal masses GU: No CVA tenderness.  Lymph: No cervical or inguinal lymphadenopathy. Skin: No rashes, bruises or suspicious lesions. Neurologic: Grossly intact, no focal deficits, moving all 4 extremities. Psychiatric: Normal mood and affect.  Laboratory Data: Lab Results  Component Value Date   WBC 11.1 (H) 06/06/2020   HGB 14.0 06/06/2020   HCT 43.4 06/06/2020   MCV 98.2 06/06/2020   PLT  217 06/06/2020    Lab Results  Component Value Date   CREATININE 0.77 06/06/2020    No results found for: PSA  Lab Results  Component Value Date   TESTOSTERONE 611 06/15/2020    Lab Results  Component Value Date   HGBA1C  04/20/2010    5.6 (NOTE)                                                                       According to the ADA Clinical Practice Recommendations for 2011, when HbA1c is used as a screening test:   >=6.5%   Diagnostic of Diabetes Mellitus           (if abnormal result  is confirmed)  5.7-6.4%   Increased risk of developing Diabetes Mellitus  References:Diagnosis and Classification of Diabetes Mellitus,Diabetes Care,2011,34(Suppl 1):S62-S69 and Standards of Medical Care in         Diabetes - 2011,Diabetes Care,2011,34  (Suppl 1):S11-S61.    Urinalysis    Component Value Date/Time   COLORURINE YELLOW 06/06/2020 1403   APPEARANCEUR Clear 07/30/2020 1017   LABSPEC 1.009 06/06/2020 1403   PHURINE 7.0 06/06/2020 1403   GLUCOSEU Negative 07/30/2020 1017   HGBUR MODERATE (A) 06/06/2020 1403   BILIRUBINUR Negative 07/30/2020 1017   KETONESUR NEGATIVE 06/06/2020 1403   PROTEINUR Negative 07/30/2020 1017   PROTEINUR 30 (A) 06/06/2020 1403   UROBILINOGEN 0.2 04/21/2010 1203   NITRITE Negative 07/30/2020 1017   NITRITE NEGATIVE 06/06/2020 1403   LEUKOCYTESUR Negative 07/30/2020 1017   LEUKOCYTESUR SMALL (A) 06/06/2020 1403    Lab Results  Component Value Date   LABMICR  Comment 07/30/2020   BACTERIA NONE SEEN 06/06/2020    Pertinent Imaging:  No results found for this or any previous visit.  No results found for this or any previous visit.  No results found for this or any previous visit.  No results found for this or any previous visit.  No results found for this or any previous visit.  No results found for this or any previous visit.  No results found for this or any previous visit.  No results found for this or any previous visit.   Assessment & Plan:    1. Benign prostatic hyperplasia with urinary obstruction -continue flomax 0.4mg  daily - BLADDER SCAN AMB NON-IMAGING - Urinalysis, Routine w reflex microscopic  2. Urinary retention -resolved  3. Erectile dysfunction due to arterial insufficiency -levitra 20mg  prn   No follow-ups on file.  , MD  [facility] [place]

## 2021-01-28 NOTE — Telephone Encounter (Signed)
Rx sent to pharmacy as requested.

## 2021-01-28 NOTE — Patient Instructions (Signed)
Benign Prostatic Hyperplasia  Benign prostatic hyperplasia (BPH) is an enlarged prostate gland that is caused by the normal aging process and not by cancer. The prostate is a walnut-sized gland that is involved in the production of semen. It is located in front of the rectum and below the bladder. The bladder stores urine and the urethra is the tube that carries the urine out of the body. The prostate may get bigger asa man gets older. An enlarged prostate can press on the urethra. This can make it harder to pass urine. The build-up of urine in the bladder can cause infection. Back pressure and infection may progress to bladder damage and kidney (renal) failure. What are the causes? This condition is part of a normal aging process. However, not all men develop problems from this condition. If the prostate enlarges away from the urethra, urine flow will not be blocked. If it enlarges toward the urethra andcompresses it, there will be problems passing urine. What increases the risk? This condition is more likely to develop in men over the age of 50 years. What are the signs or symptoms? Symptoms of this condition include: Getting up often during the night to urinate. Needing to urinate frequently during the day. Difficulty starting urine flow. Decrease in size and strength of your urine stream. Leaking (dribbling) after urinating. Inability to pass urine. This needs immediate treatment. Inability to completely empty your bladder. Pain when you pass urine. This is more common if there is also an infection. Urinary tract infection (UTI). How is this diagnosed? This condition is diagnosed based on your medical history, a physical exam, and your symptoms. Tests will also be done, such as: A post-void bladder scan. This measures any amount of urine that may remain in your bladder after you finish urinating. A digital rectal exam. In a rectal exam, your health care provider checks your prostate by  putting a lubricated, gloved finger into your rectum to feel the back of your prostate gland. This exam detects the size of your gland and any abnormal lumps or growths. An exam of your urine (urinalysis). A prostate specific antigen (PSA) screening. This is a blood test used to screen for prostate cancer. An ultrasound. This test uses sound waves to electronically produce a picture of your prostate gland. Your health care provider may refer you to a specialist in kidney and prostate diseases (urologist). How is this treated? Once symptoms begin, your health care provider will monitor your condition (active surveillance or watchful waiting). Treatment for this condition will depend on the severity of your condition. Treatment may include: Observation and yearly exams. This may be the only treatment needed if your condition and symptoms are mild. Medicines to relieve your symptoms, including: Medicines to shrink the prostate. Medicines to relax the muscle of the prostate. Surgery in severe cases. Surgery may include: Prostatectomy. In this procedure, the prostate tissue is removed completely through an open incision or with a laparoscope or robotics. Transurethral resection of the prostate (TURP). In this procedure, a tool is inserted through the opening at the tip of the penis (urethra). It is used to cut away tissue of the inner core of the prostate. The pieces are removed through the same opening of the penis. This removes the blockage. Transurethral incision (TUIP). In this procedure, small cuts are made in the prostate. This lessens the prostate's pressure on the urethra. Transurethral microwave thermotherapy (TUMT). This procedure uses microwaves to create heat. The heat destroys and removes a small   amount of prostate tissue. Transurethral needle ablation (TUNA). This procedure uses radio frequencies to destroy and remove a small amount of prostate tissue. Interstitial laser coagulation (ILC).  This procedure uses a laser to destroy and remove a small amount of prostate tissue. Transurethral electrovaporization (TUVP). This procedure uses electrodes to destroy and remove a small amount of prostate tissue. Prostatic urethral lift. This procedure inserts an implant to push the lobes of the prostate away from the urethra. Follow these instructions at home: Take over-the-counter and prescription medicines only as told by your health care provider. Monitor your symptoms for any changes. Contact your health care provider with any changes. Avoid drinking large amounts of liquid before going to bed or out in public. Avoid or reduce how much caffeine or alcohol you drink. Give yourself time when you urinate. Keep all follow-up visits as told by your health care provider. This is important. Contact a health care provider if: You have unexplained back pain. Your symptoms do not get better with treatment. You develop side effects from the medicine you are taking. Your urine becomes very dark or has a bad smell. Your lower abdomen becomes distended and you have trouble passing your urine. Get help right away if: You have a fever or chills. You suddenly cannot urinate. You feel lightheaded, or very dizzy, or you faint. There are large amounts of blood or clots in the urine. Your urinary problems become hard to manage. You develop moderate to severe low back or flank pain. The flank is the side of your body between the ribs and the hip. These symptoms may represent a serious problem that is an emergency. Do not wait to see if the symptoms will go away. Get medical help right away. Call your local emergency services (911 in the [place] Do not drive yourself to the hospital. Summary Benign prostatic hyperplasia (BPH) is an enlarged prostate that is caused by the normal aging process and not by cancer. An enlarged prostate can press on the urethra. This can make it hard to pass urine. This  condition is part of a normal aging process and is more likely to develop in men over the age of 50 years. Get help right away if you suddenly cannot urinate. This information is not intended to replace advice given to you by your health care provider. Make sure you discuss any questions you have with your healthcare provider. Document Revised: 02/26/2020 Document Reviewed: 02/26/2020 Elsevier Patient Education  2022 [employer]  

## 2021-01-29 LAB — BASIC METABOLIC PANEL
BUN/Creatinine Ratio: 22 (ref 10–24)
BUN: 18 mg/dL (ref 8–27)
CO2: 22 mmol/L (ref 20–29)
Calcium: 9.2 mg/dL (ref 8.6–10.2)
Chloride: 104 mmol/L (ref 96–106)
Creatinine, Ser: 0.83 mg/dL (ref 0.76–1.27)
Glucose: 103 mg/dL — ABNORMAL HIGH (ref 65–99)
Potassium: 4.9 mmol/L (ref 3.5–5.2)
Sodium: 140 mmol/L (ref 134–144)
eGFR: 92 mL/min/{1.73_m2} (ref >59)

## 2021-01-29 LAB — PRO B NATRIURETIC PEPTIDE: NT-Pro BNP: 568 pg/mL — ABNORMAL HIGH (ref 0–376)

## 2021-01-29 LAB — MAGNESIUM: Magnesium: 2.1 mg/dL (ref 1.6–2.3)

## 2021-02-01 ENCOUNTER — Ambulatory Visit: Payer: Medicare PPO | Admitting: Cardiology

## 2021-02-01 ENCOUNTER — Encounter: Payer: Self-pay | Admitting: Cardiology

## 2021-02-01 ENCOUNTER — Other Ambulatory Visit: Payer: Self-pay

## 2021-02-01 ENCOUNTER — Other Ambulatory Visit: Payer: Self-pay | Admitting: Cardiology

## 2021-02-01 VITALS — BP 160/82 | HR 86 | Resp 16 | Ht 69.0 in | Wt 203.0 lb

## 2021-02-01 DIAGNOSIS — I119 Hypertensive heart disease without heart failure: Secondary | ICD-10-CM

## 2021-02-01 DIAGNOSIS — E7800 Pure hypercholesterolemia, unspecified: Secondary | ICD-10-CM

## 2021-02-01 DIAGNOSIS — I10 Essential (primary) hypertension: Secondary | ICD-10-CM

## 2021-02-01 DIAGNOSIS — I255 Ischemic cardiomyopathy: Secondary | ICD-10-CM

## 2021-02-01 DIAGNOSIS — Z955 Presence of coronary angioplasty implant and graft: Secondary | ICD-10-CM

## 2021-02-01 DIAGNOSIS — I2510 Atherosclerotic heart disease of native coronary artery without angina pectoris: Secondary | ICD-10-CM

## 2021-02-01 MED ORDER — ENTRESTO 49-51 MG PO TABS: 1.0000 | ORAL_TABLET | Freq: Two times a day (BID) | ORAL | 0 refills | Status: DC

## 2021-02-01 NOTE — Progress Notes (Signed)
Brian Guernseylifford P Schoeller Date of Birth: 05/24/1946 MRN: [id number] Primary Care Provider:Miller, Misty StanleyLisa, MD Former Cardiology Providers: Altamese CarolinaAshton Kelley, APRN, FNP-C Primary Cardiologist: Tessa LernerSunit Jameeka Marcy, DO, [facility] (established care 12/30/2019)  Date: 02/01/21 Last Office Visit: 12/21/2020  Chief Complaint  Patient presents with   Cardiomyopathy   Follow-up    HPI  Brian GuernseyClifford P Gallegos is a 75 y.o.  male who presents to the office with a chief complaint of " cardiomyopathy management." Patient's past medical history and cardiovascular risk factors include: Established coronary artery disease without angina pectoris, status post stenting in the RCA in April 2017, hypertension, hyperlipidemia, prediabetes, advanced age.  Given his history of coronary artery disease patient underwent an echocardiogram and stress test back in July 2021.  Echocardiogram noted mildly reduced left ventricular systolic function with global hypokinesis and nuclear stress test was noted to be intermediate risk due to reduced LVEF but the myocardial perfusion was reported to be normal.  Clinically patient denies any angina pectoris or heart failure symptoms.  However, the last office visit the shared decision was to uptitrate his medical therapy to GDMT.  At the last office visit he was recommended to wean off of clonidine which he has successfully done so.  And he is also stopped taking lisinopril for the last 3 days.  Patient's blood pressures are elevated at today's office visit most likely secondary to holding lisinopril.  However, at home SBP ranges between 120-135 mmHg.  No hospitalizations or urgent care visits since last office encounter.  ALLERGIES: Allergies  Allergen Reactions   Bisacodyl Other (See Comments)   Morphine Itching   Morphine Sulfate Itching   Other Other (See Comments)   Codeine Itching    Low amount is tolerated   Finasteride Hives and Rash   MEDICATION LIST PRIOR TO VISIT: Current Outpatient  Medications on File Prior to Visit  Medication Sig Dispense Refill   amitriptyline (ELAVIL) 25 MG tablet Take 25 mg by mouth at bedtime.      amLODipine (NORVASC) 5 MG tablet TAKE 1/2 TABLET BY MOUTH DAILY 45 tablet 2   aspirin EC 81 MG tablet Take 81 mg by mouth daily.     atenolol (TENORMIN) 50 MG tablet TAKE 1 TABLET BY MOUTH DAILY. HOLD IF SYSTOLIC BLOOD PRESSURE IS LESS THEN 100MMHG OR IF HEART RATE IS LESS THAN 60 BPM (Patient taking differently: Take 50 mg by mouth daily.) 90 tablet 0   Cholecalciferol 50 MCG (2000 UT) CAPS Take 2,000 Units by mouth daily.      diclofenac sodium (VOLTAREN) 1 % GEL Apply 4 g topically 4 (four) times daily. (Patient taking differently: Apply 4 g topically 4 (four) times daily as needed (pain).) 5 Tube 1   doxazosin (CARDURA) 8 MG tablet Take 8 mg by mouth daily.     DULoxetine (CYMBALTA) 20 MG capsule TAKE 1 CAPSULE(20 MG) BY MOUTH DAILY 30 capsule 3   gabapentin (NEURONTIN) 300 MG capsule TAKE 1 CAPSULE BY MOUTH THREE TIMES DAILY (Patient taking differently: Take 300 mg by mouth at bedtime.) 270 capsule 0   lubiprostone (AMITIZA) 8 MCG capsule TAKE 1 CAPSULE BY MOUTH TWICE DAILY WITH FOOD AND WATER     methocarbamol (ROBAXIN) 750 MG tablet Take 750 mg by mouth at bedtime.     potassium chloride (KLOR-CON) 10 MEQ tablet TAKE 1 TABLET BY MOUTH DAILY 30 tablet 6   pyridoxine (B-6) 100 MG tablet Take 100 mg by mouth daily.     rOPINIRole (REQUIP) 0.5 MG tablet  Take 1.5 mg by mouth at bedtime.      rosuvastatin (CRESTOR) 40 MG tablet Take 40 mg by mouth daily.      tadalafil (CIALIS) 20 MG tablet Take 1 tablet (20 mg total) by mouth daily as needed. 10 tablet 5   tamsulosin (FLOMAX) 0.4 MG CAPS capsule Take 1 capsule (0.4 mg total) by mouth daily after supper. 90 capsule 3   thiamine (VITAMIN B-1) 100 MG tablet Take 100 mg by mouth daily.     vitamin B-12 (CYANOCOBALAMIN) 1000 MCG tablet Take 1,000 mcg by mouth daily.      vitamin E 180 MG (400 UNITS) capsule 1  capsule     Current Facility-Administered Medications on File Prior to Visit  Medication Dose Route Frequency Provider Last Rate Last Admin   methylPREDNISolone acetate (DEPO-MEDROL) injection 80 mg  80 mg Intramuscular Once Naida Sleight, PA-C        PAST MEDICAL HISTORY: Past Medical History:  Diagnosis Date   Arthritis    "all over"   Chronic lower back pain    Early cataracts, bilateral    Family history of adverse reaction to anesthesia    "son had ligament repair of elbow in 2016; woke up nauseated and disoriented"   Hypercholesterolemia    Hypertension    Migraine    "none in years" (10/12/2015)   Restless leg syndrome    Wears glasses     PAST SURGICAL HISTORY: Past Surgical History:  Procedure Laterality Date   ANTERIOR CERVICAL DECOMP/DISCECTOMY FUSION  ~ 2005   ANTERIOR CERVICAL DISCECTOMY     BACK SURGERY     CARDIAC CATHETERIZATION N/A 10/12/2015   Procedure: Left Heart Cath and Coronary Angiography;  Surgeon: Yates Decamp, MD;  Location: [facility] INVASIVE CV LAB;  Service: Cardiovascular;  Laterality: N/A;   CARDIAC CATHETERIZATION  10/12/2015   Procedure: Coronary Stent Intervention;  Surgeon: Yates Decamp, MD;  Location: [facility] INVASIVE CV LAB;  Service: Cardiovascular;;   COLONOSCOPY     CORONARY ANGIOPLASTY  10/12/2015   DES RCA   EYE SURGERY     FRACTURE SURGERY     GLAUCOMA SURGERY Left 03/2015   JOINT REPLACEMENT     KNEE ARTHROSCOPY Bilateral    LUMBAR DISC SURGERY  ~ 1986-02/08/2007 X 6   MAXIMUM ACCESS (MAS)POSTERIOR LUMBAR INTERBODY FUSION (PLIF) 3 LEVEL  02/08/2007   w/rods and screws   SPINAL CORD STIMULATOR INSERTION N/A 05/08/2014   Procedure: LUMBAR SPINAL CORD STIMULATOR IMPLANT ;  Surgeon: Gwynne Edinger, MD;  Location: [facility] NEURO ORS;  Service: Neurosurgery;  Laterality: N/A;   TOE FUSION Right 06/2014   "outside part of big toe"   TOTAL KNEE ARTHROPLASTY Left ~ 2011    FAMILY HISTORY: The patient's family history includes Aneurysm in his mother; Cancer in  an other family member; Heart attack in his father; Hypertension in his father.   SOCIAL HISTORY:  The patient  reports that he has never smoked. He has never used smokeless tobacco. He reports previous alcohol use. He reports that he does not use drugs.  Review of Systems  Constitutional: Negative for chills and fever.  HENT:  Negative for hoarse voice and nosebleeds.   Eyes:  Negative for discharge, double vision and pain.  Cardiovascular:  Negative for chest pain, claudication, dyspnea on exertion, leg swelling, near-syncope, orthopnea, palpitations, paroxysmal nocturnal dyspnea and syncope.  Respiratory:  Negative for hemoptysis and shortness of breath.   Musculoskeletal:  Negative for muscle cramps and myalgias.  Gastrointestinal:  Negative for abdominal pain, constipation, diarrhea, hematemesis, hematochezia, melena, nausea and vomiting.  Neurological:  Negative for dizziness and light-headedness.   PHYSICAL EXAM: Vitals with BMI 02/01/2021 02/01/2021 01/28/2021  Height -  -  Weight - 203 lbs -  BMI - 29.96 -  Systolic 160 199 161  Diastolic 82 95 64  Pulse - 86 66    CONSTITUTIONAL: Well-developed and well-nourished. No acute distress.  SKIN: Skin is warm and dry. No rash noted. No cyanosis. No pallor. No jaundice HEAD: Normocephalic and atraumatic.  EYES: No scleral icterus MOUTH/THROAT: Moist oral membranes.  NECK: No JVD present. No thyromegaly noted. LYMPHATIC: No visible cervical adenopathy.  CHEST Normal respiratory effort. No intercostal retractions  LUNGS: Clear to auscultation bilaterally.  No stridor. No wheezes. No rales.  CARDIOVASCULAR: Regular rate and rhythm, positive S1-S2, no murmurs rubs or gallops appreciated. ABDOMINAL: Soft, nontender, nondistended, positive bowel sounds in all 4 quadrants.  No apparent ascites.  EXTREMITIES: No peripheral edema, warm to touch bilaterally.  With 2+ dorsalis pedis and posterior tibial pulses. HEMATOLOGIC: No significant  bruising NEUROLOGIC: Oriented to person, place, and time. Nonfocal. Normal muscle tone.  PSYCHIATRIC: Normal mood and affect. Normal behavior. Cooperative  CARDIAC DATABASE: EKG: 05/27/2019: Sinus bradycardia at 58 bpm, normal axis, PRWP cannot exclude anterior infarct old. No evidence of ischemia.  12/30/2019: Sinus  Bradycardia, 54bpm, without underlying injury pattern.  Echocardiogram: 01/19/2020: LVEF 45-50%, mild global hypokinesis, moderate LVH, grade 1 diastolic impairment, normal LAP, moderately dilated left atrium, mild AR, moderate MR.  Stress Testing:  Lexiscan/modified Bruce Tetrofosmin stress test 01/19/2020:  Lexiscan/modified Bruce nuclear stress test performed using 1-day protocol.  Stress EKG is non-diagnostic, as this is pharmacological stress test. 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  Normal myocardial perfusion. Stress LVEF calculated 44%, although visually appears normal.  Intermediate risk study due to reduced LVEF. Recommend clinical correlation.   Heart Catheterization: Coronary angiogram 10/12/2015: Normal LV systolic function. Moderate disease in the mid circumflex coronary artery of 50%. Diffuse coronary calcification involving the proximal LAD. Right coronary artery very large and dominant. Proximal segment has ulcerated 90% stenosis. Mild amount of calcification is also evident. Stenting proximal large dominant RCA for an ulcerated 90% stenosis to 0% with implantation of a 4.0 x 18 mm resolute DES.  Vascular imaging: Abdominal aortic duplex 05/30/2017: Focal plaque noted in the proximal, mid and distal aorta. The maximum aorta diameter is 2.82 cm (dist). No AAA. Normal iliac velocity.  LABORATORY DATA: CBC Latest Ref Rng & Units 06/06/2020 04/29/2014 04/22/2010  WBC 4.0 - 10.5 K/uL 11.1(H) 6.5 13.3(H)  Hemoglobin 13.0 - 17.0 g/dL 09.6 04.5 4.0(J)  Hematocrit 39.0 - 52.0 % 43.4 38.4(L)  29.5(L)  Platelets 150 - 400 K/uL 217 210 146(L)    CMP Latest Ref Rng & Units 01/28/2021 06/06/2020 01/06/2020  Glucose 65 - 99 mg/dL 811(B) 147(W) 295(A)  BUN 8 - 27 mg/dL 18 6(L) 11  Creatinine 0.76 - 1.27 mg/dL 2.13 0.86 5.78  Sodium 134 - 144 mmol/L 140 137 137  Potassium 3.5 - 5.2 mmol/L 4.9 3.1(L) 3.9  Chloride 96 - 106 mmol/L 104 99 99  CO2 20 - 29 mmol/L Calcium 8.6 - 10.2 mg/dL 9.2 46.9 9.7  Total Protein 6.5 - 8.1 g/dL - 7.0 -  Total Bilirubin 0.3 - 1.2 mg/dL - 1.2 -  Alkaline Phos 38 - 126 U/L - 67 -  AST 15 - 41 U/L - 30 -  ALT 0 - 44 U/L - 20 -    Lipid Panel  No results found for: CHOL, TRIG, HDL, CHOLHDL, VLDL, LDLCALC, LDLDIRECT, LABVLDL  Lab Results  Component Value Date   HGBA1C  04/20/2010    5.6 (NOTE)                                                                       According to the ADA Clinical Practice Recommendations for 2011, when HbA1c is used as a screening test:   >=6.5%   Diagnostic of Diabetes Mellitus           (if abnormal result  is confirmed)  5.7-6.4%   Increased risk of developing Diabetes Mellitus  References:Diagnosis and Classification of Diabetes Mellitus,Diabetes Care,2011,34(Suppl 1):S62-S69 and Standards of Medical Care in         Diabetes - 2011,Diabetes Care,2011,34  (Suppl 1):S11-S61.   No components found for: NTPROBNP No results found for: TSH  Cardiac Panel (last 3 results) No results for input(s): CKTOTAL, CKMB, TROPONINIHS, RELINDX in the last 72 hours.  IMPRESSION:    ICD-10-CM   1. Ischemic cardiomyopathy  I25.5 sacubitril-valsartan (ENTRESTO) 49-51 MG    Basic metabolic panel    Magnesium    Pro b natriuretic peptide (BNP)    2. Atherosclerosis of native coronary artery of native heart without angina pectoris  I25.10     3. History of coronary angioplasty with insertion of stent  Z95.5     4. Hypertension with heart disease  I11.9     5. Hypercholesterolemia  E78.00     6. Essential hypertension  I10      7. H/O heart artery stent  Z95.5        RECOMMENDATIONS: WINDEL KEZIAH is a 75 y.o. male whose past medical history and cardiovascular risk factors include: Established coronary artery disease without angina pectoris, status post stenting in the RCA in April 2017, hypertension, hyperlipidemia, prediabetes, advanced age.  Ischemic cardiomyopathy: Last LVEF 45%. Overall euvolemic and not in congestive heart failure. Has discontinued lisinopril for the last 3 days. Start Entresto 49/51 mg p.o. twice daily Blood work in 1 week to evaluate kidney function and electrolytes. Will continue to uptitrate GDMT at the following visits.  Patient is asked to have blood work prior to the next office visit as well. Independently reviewed labs from 01/28/2021.  Established coronary artery disease without angina pectoris and prior PCI: Reviewed the results of the last echocardiogram and stress test as part of today's office visit. Currently asymptomatic with regard to anginal symptoms. Medications reconciled.  Benign essential hypertension: Office blood pressures are not well controlled -currently holding lisinopril as discussed above. Home blood pressures are better controlled. Currently managed by his providers at the [facility] hospital.   Recommend a low-salt diet.  Hypercholesterolemia: Continue statin therapy.   Follow lipids. Currently managed by primary care provider. Patient denies myalgia or other side effects.  FINAL MEDICATION LIST END OF ENCOUNTER: Meds ordered this encounter  Medications   sacubitril-valsartan (ENTRESTO) 49-51 MG    Sig: Take 1 tablet by mouth 2 (two) times daily.    Dispense:  180 tablet    Refill:  0  Medications Discontinued During This Encounter  Medication Reason   cloNIDine (CATAPRES) 0.1 MG tablet Error   lisinopril (ZESTRIL) 40 MG tablet Error   senna (SENOKOT) 8.6 MG TABS tablet Error   vardenafil (LEVITRA) 20 MG tablet Patient Preference      Current Outpatient Medications:    amitriptyline (ELAVIL) 25 MG tablet, Take 25 mg by mouth at bedtime. , Disp: , Rfl:    amLODipine (NORVASC) 5 MG tablet, TAKE 1/2 TABLET BY MOUTH DAILY, Disp: 45 tablet, Rfl: 2   aspirin EC 81 MG tablet, Take 81 mg by mouth daily., Disp: , Rfl:    atenolol (TENORMIN) 50 MG tablet, TAKE 1 TABLET BY MOUTH DAILY. HOLD IF SYSTOLIC BLOOD PRESSURE IS LESS THEN OR IF HEART RATE IS LESS THAN 60 BPM (Patient taking differently: Take 50 mg by mouth daily.), Disp: 90 tablet, Rfl: 0   Cholecalciferol 50 MCG (2000 UT) CAPS, Take 2,000 Units by mouth daily. , Disp: , Rfl:    diclofenac sodium (VOLTAREN) 1 % GEL, Apply 4 g topically 4 (four) times daily. (Patient taking differently: Apply 4 g topically 4 (four) times daily as needed (pain).), Disp: 5 Tube, Rfl: 1   doxazosin (CARDURA) 8 MG tablet, Take 8 mg by mouth daily., Disp: , Rfl:    DULoxetine (CYMBALTA) 20 MG capsule, TAKE 1 CAPSULE(20 MG) BY MOUTH DAILY, Disp: 30 capsule, Rfl: 3   furosemide (LASIX) 20 MG tablet, TAKE 1 TABLET(20 MG) BY MOUTH IN THE MORNING, Disp: 90 tablet, Rfl: 0   gabapentin (NEURONTIN) 300 MG capsule, TAKE 1 CAPSULE BY MOUTH THREE TIMES DAILY (Patient taking differently: Take 300 mg by mouth at bedtime.), Disp: 270 capsule, Rfl: 0   lubiprostone (AMITIZA) 8 MCG capsule, TAKE 1 CAPSULE BY MOUTH TWICE DAILY WITH FOOD AND WATER, Disp: , Rfl:    methocarbamol (ROBAXIN) 750 MG tablet, Take 750 mg by mouth at bedtime., Disp: , Rfl:    potassium chloride (KLOR-CON) 10 MEQ tablet, TAKE 1 TABLET BY MOUTH DAILY, Disp: 30 tablet, Rfl: 6   pyridoxine (B-6) 100 MG tablet, Take 100 mg by mouth daily., Disp: , Rfl:    rOPINIRole (REQUIP) 0.5 MG tablet, Take 1.5 mg by mouth at bedtime. , Disp: , Rfl:    rosuvastatin (CRESTOR) 40 MG tablet, Take 40 mg by mouth daily. , Disp: , Rfl:    sacubitril-valsartan (ENTRESTO) 49-51 MG, Take 1 tablet by mouth 2 (two) times daily., Disp: 180 tablet, Rfl: 0    tadalafil (CIALIS) 20 MG tablet, Take 1 tablet (20 mg total) by mouth daily as needed., Disp: 10 tablet, Rfl: 5   tamsulosin (FLOMAX) 0.4 MG CAPS capsule, Take 1 capsule (0.4 mg total) by mouth daily after supper., Disp: 90 capsule, Rfl: 3   thiamine (VITAMIN B-1) 100 MG tablet, Take 100 mg by mouth daily., Disp: , Rfl:    vitamin B-12 (CYANOCOBALAMIN) 1000 MCG tablet, Take 1,000 mcg by mouth daily. , Disp: , Rfl:    vitamin E 180 MG (400 UNITS) capsule, 1 capsule, Disp: , Rfl:   Current Facility-Administered Medications:    methylPREDNISolone acetate (DEPO-MEDROL) injection 80 mg, 80 mg, Intramuscular, Once, Zonia Kief M, PA-C  Orders Placed This Encounter  Procedures   Basic metabolic panel   Magnesium   Pro b natriuretic peptide (BNP)     --Continue cardiac medications as reconciled in final medication list. --Return in about 6 weeks (around 03/15/2021) for Follow up cardiomyopathy . Or sooner if needed. --Continue follow-up with your  primary care physician regarding the management of your other chronic comorbid conditions.  Patient's questions and concerns were addressed to his satisfaction. He voices understanding of the instructions provided during this encounter.   This note was created using a voice recognition software as a result there may be grammatical errors inadvertently enclosed that do not reflect the nature of this encounter. Every attempt is made to correct such errors.   Tessa Lerner, [place], [facility]  Pager: [phone] Office: [phone]

## 2021-02-07 ENCOUNTER — Other Ambulatory Visit: Payer: Self-pay

## 2021-02-07 DIAGNOSIS — I255 Ischemic cardiomyopathy: Secondary | ICD-10-CM

## 2021-02-11 LAB — BASIC METABOLIC PANEL
BUN/Creatinine Ratio: 17 (ref 10–24)
BUN: 13 mg/dL (ref 8–27)
CO2: 25 mmol/L (ref 20–29)
Calcium: 9.6 mg/dL (ref 8.6–10.2)
Chloride: 102 mmol/L (ref 96–106)
Creatinine, Ser: 0.77 mg/dL (ref 0.76–1.27)
Glucose: 95 mg/dL (ref 65–99)
Potassium: 4.7 mmol/L (ref 3.5–5.2)
Sodium: 141 mmol/L (ref 134–144)
eGFR: 94 mL/min/{1.73_m2} (ref >59)

## 2021-02-11 LAB — PRO B NATRIURETIC PEPTIDE: NT-Pro BNP: 175 pg/mL (ref 0–376)

## 2021-02-11 LAB — MAGNESIUM: Magnesium: 1.9 mg/dL (ref 1.6–2.3)

## 2021-02-14 ENCOUNTER — Telehealth: Payer: Self-pay

## 2021-02-14 NOTE — Telephone Encounter (Signed)
Patient called office today to let Dr. Ronne Binning know he took 1 Levitra last night and per family patient started to talk out of his head.   Instructed patient to not to that medication anymore.   MD sent message to advise on another medication.  Pt understands MD on vacation all week so response will be delayed.

## 2021-02-16 NOTE — Telephone Encounter (Signed)
From pt

## 2021-02-22 ENCOUNTER — Telehealth: Payer: Self-pay

## 2021-02-22 NOTE — Telephone Encounter (Signed)
See other note

## 2021-02-22 NOTE — Telephone Encounter (Signed)
Patient called again today to make sure doctor was aware of the adverse reaction he had to the Levitra. Patient made aware that a message had already been sent to MD and he will review once back from vacation. Patient instructed to not take anymore of the medication. Patient voiced understanding.

## 2021-02-28 ENCOUNTER — Telehealth: Payer: Self-pay

## 2021-03-03 ENCOUNTER — Telehealth: Payer: Self-pay

## 2021-03-04 NOTE — Telephone Encounter (Signed)
So the BP has improved after reducing Cardura.   How does he feel?   If better continue the current meds. Continue checking BP twice a day around the same time and bring in the BP log at next visit.   Pls remind him to bring in his meds at next visit.

## 2021-03-04 NOTE — Telephone Encounter (Signed)
Called pt informed him about the message above. Pt mention he is feeling better and that he only feels dizzy when playing golf. Pt understood to bring meds and his bp log.

## 2021-03-10 ENCOUNTER — Telehealth: Payer: Self-pay

## 2021-03-10 NOTE — Telephone Encounter (Signed)
Pt called and stated that he took his bp this morning around 7:30am and it was 200/101. He said he felt fine and did not feel as though his bp was was up. He took his BP medication and then checked it every 15 minutes and he said it has been going down. Last time he checked it it was 160/82. Pt would like to know if he should be worried. His next appt is 03/15/2021 at 1:45 but he wants to know if he should come in sooner. Please advise.

## 2021-03-10 NOTE — Telephone Encounter (Signed)
Please ask him how much Norvasc he is taking.   ST

## 2021-03-11 ENCOUNTER — Other Ambulatory Visit: Payer: Self-pay | Admitting: Cardiology

## 2021-03-11 ENCOUNTER — Other Ambulatory Visit: Payer: Self-pay

## 2021-03-11 DIAGNOSIS — I10 Essential (primary) hypertension: Secondary | ICD-10-CM

## 2021-03-11 DIAGNOSIS — I119 Hypertensive heart disease without heart failure: Secondary | ICD-10-CM

## 2021-03-11 NOTE — Telephone Encounter (Signed)
Pt has not been taking the amlodipine since 02/28/2021. Pt has been taking the half of his entresto in the morning and at night. He also has cut the Doxazosin to 4mg  instead of 8mg .

## 2021-03-11 NOTE — Telephone Encounter (Signed)
If blood pressures are still high have him go back on full pill of entresto.  Please order and release BNP, BMP, and Mg before the next visit.

## 2021-03-11 NOTE — Telephone Encounter (Signed)
Called and spoke to pt, pt voiced understanding.

## 2021-03-15 ENCOUNTER — Other Ambulatory Visit: Payer: Self-pay

## 2021-03-15 ENCOUNTER — Ambulatory Visit: Payer: Medicare PPO | Admitting: Cardiology

## 2021-03-15 ENCOUNTER — Encounter: Payer: Self-pay | Admitting: Cardiology

## 2021-03-15 VITALS — BP 120/66 | HR 78 | Temp 98.8°F | Resp 16 | Ht 69.0 in | Wt 203.0 lb

## 2021-03-15 DIAGNOSIS — I2510 Atherosclerotic heart disease of native coronary artery without angina pectoris: Secondary | ICD-10-CM

## 2021-03-15 DIAGNOSIS — Z955 Presence of coronary angioplasty implant and graft: Secondary | ICD-10-CM

## 2021-03-15 DIAGNOSIS — I255 Ischemic cardiomyopathy: Secondary | ICD-10-CM

## 2021-03-15 DIAGNOSIS — I119 Hypertensive heart disease without heart failure: Secondary | ICD-10-CM

## 2021-03-15 DIAGNOSIS — I10 Essential (primary) hypertension: Secondary | ICD-10-CM

## 2021-03-15 DIAGNOSIS — E7800 Pure hypercholesterolemia, unspecified: Secondary | ICD-10-CM

## 2021-03-15 LAB — BASIC METABOLIC PANEL
BUN/Creatinine Ratio: 17 (ref 10–24)
BUN: 20 mg/dL (ref 8–27)
CO2: 26 mmol/L (ref 20–29)
Calcium: 8.8 mg/dL (ref 8.6–10.2)
Chloride: 104 mmol/L (ref 96–106)
Creatinine, Ser: 1.16 mg/dL (ref 0.76–1.27)
Glucose: 86 mg/dL (ref 65–99)
Potassium: 4.3 mmol/L (ref 3.5–5.2)
Sodium: 143 mmol/L (ref 134–144)
eGFR: 66 mL/min/{1.73_m2} (ref >59)

## 2021-03-15 LAB — BRAIN NATRIURETIC PEPTIDE: BNP: 72.9 pg/mL (ref 0.0–100.0)

## 2021-03-15 LAB — MAGNESIUM: Magnesium: 1.8 mg/dL (ref 1.6–2.3)

## 2021-03-15 MED ORDER — FUROSEMIDE 20 MG PO TABS: 20.0000 mg | ORAL_TABLET | Freq: Every day | ORAL | 0 refills | Status: DC | PRN

## 2021-03-15 NOTE — Progress Notes (Signed)
Brian Gallegos Date of Birth: 1946-03-18 MRN: [id number] Primary Care Provider:Miller, Misty Stanley, MD Former Cardiology Providers: Altamese Bledsoe, APRN, FNP-C Primary Cardiologist: Tessa Lerner, DO, [facility] (established care 12/30/2019)  Date: 03/15/21 Last Office Visit: 02/01/2021  Chief Complaint  Patient presents with   Cardiomyopathy   Follow-up    HPI  Brian Gallegos is a 75 y.o.  male who presents to the office with a chief complaint of " 1 month follow-up for cardiomyopathy management." Patient's past medical history and cardiovascular risk factors include: Established coronary artery disease without angina pectoris, status post stenting in the RCA in April 2017, hypertension, hyperlipidemia, prediabetes, advanced age.  Given his history of coronary disease underwent an echocardiogram back in July 2021 which noted mildly reduced left ventricular systolic function with global hypokinesis.  Nuclear stress test was reported to be intermediate risk study due to reduced LVEF but myocardial perfusion was preserved.  That shared decision was to proceed with titration of GDMT.  Patient has been successfully weaned off of clonidine.  Lisinopril has been transitioned over to [place].  Since last office visit patient had called the office stating that his blood pressures were running soft and he was asked to discontinue reduce the Entresto to half a pill twice a day.  Thereafter patient called with his blood pressure was now elevated.  At that time he was instructed to stop amlodipine, reduce Cardura 8 mg to 4 mg p.o. daily, and restart a full tablet of Entresto twice a day.  Patient's office blood pressures are very well controlled; however, states that his home blood pressures are not well controlled.  No hospitalizations or urgent care visits since last office encounter.  ALLERGIES: Allergies  Allergen Reactions   Bisacodyl Other (See Comments)   Morphine Itching   Morphine Sulfate  Itching   Other Other (See Comments)   Codeine Itching    Low amount is tolerated   Finasteride Hives and Rash   MEDICATION LIST PRIOR TO VISIT: Current Outpatient Medications on File Prior to Visit  Medication Sig Dispense Refill   amitriptyline (ELAVIL) 25 MG tablet Take 25 mg by mouth at bedtime.      aspirin EC 81 MG tablet Take 81 mg by mouth daily.     atenolol (TENORMIN) 50 MG tablet TAKE 1 TABLET BY MOUTH DAILY. HOLD IF SYSTOLIC BLOOD PRESSURE IS LESS THEN OR IF HEART RATE IS LESS THAN 60 BPM (Patient taking differently: Take 50 mg by mouth daily.) 90 tablet 0   Cholecalciferol 50 MCG (2000 UT) CAPS Take 2,000 Units by mouth daily.      diclofenac sodium (VOLTAREN) 1 % GEL Apply 4 g topically 4 (four) times daily. (Patient taking differently: Apply 4 g topically 4 (four) times daily as needed (pain).) 5 Tube 1   doxazosin (CARDURA) 4 MG tablet Take 4 mg by mouth daily.     DULoxetine (CYMBALTA) 20 MG capsule TAKE 1 CAPSULE(20 MG) BY MOUTH DAILY 30 capsule 3   gabapentin (NEURONTIN) 300 MG capsule TAKE 1 CAPSULE BY MOUTH THREE TIMES DAILY (Patient taking differently: Take 300 mg by mouth at bedtime.) 270 capsule 0   lubiprostone (AMITIZA) 8 MCG capsule TAKE 1 CAPSULE BY MOUTH TWICE DAILY WITH FOOD AND WATER     methocarbamol (ROBAXIN) 750 MG tablet Take 750 mg by mouth at bedtime.     potassium chloride (KLOR-CON) 10 MEQ tablet TAKE 1 TABLET BY MOUTH DAILY 30 tablet 6   pyridoxine (B-6) 100 MG tablet  Take 100 mg by mouth daily.     rOPINIRole (REQUIP) 0.5 MG tablet Take 1.5 mg by mouth at bedtime.      rosuvastatin (CRESTOR) 40 MG tablet Take 40 mg by mouth daily.      sacubitril-valsartan (ENTRESTO) 49-51 MG Take 1 tablet by mouth 2 (two) times daily. 180 tablet 0   tadalafil (CIALIS) 20 MG tablet Take 1 tablet (20 mg total) by mouth daily as needed. 10 tablet 5   tamsulosin (FLOMAX) 0.4 MG CAPS capsule Take 1 capsule (0.4 mg total) by mouth daily after supper. 90 capsule 3    thiamine (VITAMIN B-1) 100 MG tablet Take 100 mg by mouth daily.     vitamin B-12 (CYANOCOBALAMIN) 1000 MCG tablet Take 1,000 mcg by mouth daily.      vitamin E 180 MG (400 UNITS) capsule 1 capsule     Current Facility-Administered Medications on File Prior to Visit  Medication Dose Route Frequency Provider Last Rate Last Admin   methylPREDNISolone acetate (DEPO-MEDROL) injection 80 mg  80 mg Intramuscular Once Naida Sleight, PA-C        PAST MEDICAL HISTORY: Past Medical History:  Diagnosis Date   Arthritis    "all over"   Chronic lower back pain    Early cataracts, bilateral    Family history of adverse reaction to anesthesia    "son had ligament repair of elbow in 2016; woke up nauseated and disoriented"   Hypercholesterolemia    Hypertension    Migraine    "none in years" (10/12/2015)   Restless leg syndrome    Wears glasses     PAST SURGICAL HISTORY: Past Surgical History:  Procedure Laterality Date   ANTERIOR CERVICAL DECOMP/DISCECTOMY FUSION  ~ 2005   ANTERIOR CERVICAL DISCECTOMY     BACK SURGERY     CARDIAC CATHETERIZATION N/A 10/12/2015   Procedure: Left Heart Cath and Coronary Angiography;  Surgeon: Yates Decamp, MD;  Location: [facility] INVASIVE CV LAB;  Service: Cardiovascular;  Laterality: N/A;   CARDIAC CATHETERIZATION  10/12/2015   Procedure: Coronary Stent Intervention;  Surgeon: Yates Decamp, MD;  Location: [facility] INVASIVE CV LAB;  Service: Cardiovascular;;   COLONOSCOPY     CORONARY ANGIOPLASTY  10/12/2015   DES RCA   EYE SURGERY     FRACTURE SURGERY     GLAUCOMA SURGERY Left 03/2015   JOINT REPLACEMENT     KNEE ARTHROSCOPY Bilateral    LUMBAR DISC SURGERY  ~ 1986-02/08/2007 X 6   MAXIMUM ACCESS (MAS)POSTERIOR LUMBAR INTERBODY FUSION (PLIF) 3 LEVEL  02/08/2007   w/rods and screws   SPINAL CORD STIMULATOR INSERTION N/A 05/08/2014   Procedure: LUMBAR SPINAL CORD STIMULATOR IMPLANT ;  Surgeon: Gwynne Edinger, MD;  Location: [facility] NEURO ORS;  Service: Neurosurgery;  Laterality:  N/A;   TOE FUSION Right 06/2014   "outside part of big toe"   TOTAL KNEE ARTHROPLASTY Left ~ 2011    FAMILY HISTORY: The patient's family history includes Aneurysm in his mother; Cancer in an other family member; Heart attack in his father; Hypertension in his father.   SOCIAL HISTORY:  The patient  reports that he has never smoked. He has never used smokeless tobacco. He reports that he does not currently use alcohol. He reports that he does not use drugs.  Review of Systems  Constitutional: Negative for chills and fever.  HENT:  Negative for hoarse voice and nosebleeds.   Eyes:  Negative for discharge, double vision and pain.  Cardiovascular:  Negative  for chest pain, claudication, dyspnea on exertion, leg swelling, near-syncope, orthopnea, palpitations, paroxysmal nocturnal dyspnea and syncope.  Respiratory:  Negative for hemoptysis and shortness of breath.   Musculoskeletal:  Negative for muscle cramps and myalgias.  Gastrointestinal:  Negative for abdominal pain, constipation, diarrhea, hematemesis, hematochezia, melena, nausea and vomiting.  Neurological:  Negative for dizziness and light-headedness.   PHYSICAL EXAM: Vitals with BMI 03/15/2021 02/01/2021 02/01/2021  Height  -   Weight 203 lbs - 203 lbs  BMI 29.96 - 29.96  Systolic 120 160 161  Diastolic 66 82 95  Pulse 78 - 86    CONSTITUTIONAL: Well-developed and well-nourished. No acute distress.  SKIN: Skin is warm and dry. No rash noted. No cyanosis. No pallor. No jaundice HEAD: Normocephalic and atraumatic.  EYES: No scleral icterus MOUTH/THROAT: Moist oral membranes.  NECK: No JVD present. No thyromegaly noted. LYMPHATIC: No visible cervical adenopathy.  CHEST Normal respiratory effort. No intercostal retractions  LUNGS: Clear to auscultation bilaterally.  No stridor. No wheezes. No rales.  CARDIOVASCULAR: Regular rate and rhythm, positive S1-S2, no murmurs rubs or gallops appreciated. ABDOMINAL: Soft,  nontender, nondistended, positive bowel sounds in all 4 quadrants.  No apparent ascites.  EXTREMITIES: No peripheral edema, warm to touch bilaterally.  With 2+ dorsalis pedis and posterior tibial pulses. HEMATOLOGIC: No significant bruising NEUROLOGIC: Oriented to person, place, and time. Nonfocal. Normal muscle tone.  PSYCHIATRIC: Normal mood and affect. Normal behavior. Cooperative  CARDIAC DATABASE: EKG: 05/27/2019: Sinus bradycardia at 58 bpm, normal axis, PRWP cannot exclude anterior infarct old. No evidence of ischemia.  12/30/2019: Sinus  Bradycardia, 54bpm, without underlying injury pattern.  Echocardiogram: 01/19/2020: LVEF 45-50%, mild global hypokinesis, moderate LVH, grade 1 diastolic impairment, normal LAP, moderately dilated left atrium, mild AR, moderate MR.  Stress Testing:  Lexiscan/modified Bruce Tetrofosmin stress test 01/19/2020:  Lexiscan/modified Bruce nuclear stress test performed using 1-day protocol.  Stress EKG is non-diagnostic, as this is pharmacological stress test. 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  Normal myocardial perfusion. Stress LVEF calculated 44%, although visually appears normal.  Intermediate risk study due to reduced LVEF. Recommend clinical correlation.   Heart Catheterization: Coronary angiogram 10/12/2015: Normal LV systolic function. Moderate disease in the mid circumflex coronary artery of 50%. Diffuse coronary calcification involving the proximal LAD. Right coronary artery very large and dominant. Proximal segment has ulcerated 90% stenosis. Mild amount of calcification is also evident. Stenting proximal large dominant RCA for an ulcerated 90% stenosis to 0% with implantation of a 4.0 x 18 mm resolute DES.  Vascular imaging: Abdominal aortic duplex 05/30/2017: Focal plaque noted in the proximal, mid and distal aorta. The maximum aorta diameter is 2.82 cm (dist). No  AAA. Normal iliac velocity.  LABORATORY DATA: CBC Latest Ref Rng & Units 06/06/2020 04/29/2014 04/22/2010  WBC 4.0 - 10.5 K/uL 11.1(H) 6.5 13.3(H)  Hemoglobin 13.0 - 17.0 g/dL 09.6 04.5 4.0(J)  Hematocrit 39.0 - 52.0 % 43.4 38.4(L) 29.5(L)  Platelets 150 - 400 K/uL 217 210 146(L)    CMP Latest Ref Rng & Units 03/14/2021 02/10/2021 01/28/2021  Glucose 65 - 99 mg/dL 86 95 811(B)  BUN 8 - 27 mg/dL Creatinine 0.76 - 1.27 mg/dL 1.47 8.29 5.62  Sodium 134 - 144 mmol/L 143 141 140  Potassium 3.5 - 5.2 mmol/L 4.3 4.7 4.9  Chloride 96 - 106 mmol/L 104 102 104  CO2 20 - 29 mmol/L 26 25 22  Calcium 8.6 - 10.2 mg/dL 8.8 9.6 9.2  Total Protein 6.5 - 8.1 g/dL - - -  Total Bilirubin 0.3 - 1.2 mg/dL - - -  Alkaline Phos 38 - 126 U/L - - -  AST 15 - 41 U/L - - -  ALT 0 - 44 U/L - - -    Lipid Panel  No results found for: CHOL, TRIG, HDL, CHOLHDL, VLDL, LDLCALC, LDLDIRECT, LABVLDL  Lab Results  Component Value Date   HGBA1C  04/20/2010    5.6 (NOTE)                                                                       According to the ADA Clinical Practice Recommendations for 2011, when HbA1c is used as a screening test:   >=6.5%   Diagnostic of Diabetes Mellitus           (if abnormal result  is confirmed)  5.7-6.4%   Increased risk of developing Diabetes Mellitus  References:Diagnosis and Classification of Diabetes Mellitus,Diabetes Care,2011,34(Suppl 1):S62-S69 and Standards of Medical Care in         Diabetes - 2011,Diabetes Care,2011,34  (Suppl 1):S11-S61.   No components found for: NTPROBNP No results found for: TSH  Cardiac Panel (last 3 results) No results for input(s): CKTOTAL, CKMB, TROPONINIHS, RELINDX in the last 72 hours.  IMPRESSION:    ICD-10-CM   1. Ischemic cardiomyopathy  I25.5     2. Atherosclerosis of native coronary artery of native heart without angina pectoris  I25.10     3. History of coronary angioplasty with insertion of stent  Z95.5     4.  Hypertension with heart disease  I11.9 furosemide (LASIX) 20 MG tablet    5. Hypercholesterolemia  E78.00     6. Essential hypertension  I10     7. H/O heart artery stent  Z95.5        RECOMMENDATIONS: SHAHRUKH PASCH is a 75 y.o. male whose past medical history and cardiovascular risk factors include: Established coronary artery disease without angina pectoris, status post stenting in the RCA in April 2017, hypertension, hyperlipidemia, prediabetes, advanced age.  Ischemic cardiomyopathy: Last LVEF 45%. Overall euvolemic and not in congestive heart failure. Continue Entresto 49/51 mg p.o. twice daily. Very concerned that his home blood pressures are elevated compared to the office. Will enroll in interventional care management for blood pressure monitoring.  If home blood pressure allows would like to start [place]. Given his episodes of lightheadedness and dizziness will slowly uptitrate GDMT.  However, in the meantime I have asked him to discuss with his urologist and his provider at the [facility] hospital to see if he can either come off of a reduced dose of Cardura such that he has room to uptitrate GDMT. Independently reviewed labs from 03/14/2021 Refilled Lasix, to be taken on as needed basis  Established coronary artery disease without angina pectoris and prior PCI: Reviewed the results of the last echocardiogram and stress test as part of today's office visit. Currently asymptomatic with regard to anginal symptoms. Medications reconciled.  Benign essential hypertension: Will enroll into principal care management to further evaluate his ambulatory blood pressure readings.  If his blood pressure allows we will further uptitrate GDMT as discussed  above. Currently managed by his providers at the [facility] hospital.   Recommend a low-salt diet.  Hypercholesterolemia: Continue statin therapy.   Follow lipids. Currently managed by primary care provider. Patient denies myalgia or other side  effects.  FINAL MEDICATION LIST END OF ENCOUNTER: Meds ordered this encounter  Medications   furosemide (LASIX) 20 MG tablet    Sig: Take 1 tablet (20 mg total) by mouth daily as needed for edema.    Dispense:  30 tablet    Refill:  0     Medications Discontinued During This Encounter  Medication Reason   amLODipine (NORVASC) 5 MG tablet Error   furosemide (LASIX) 20 MG tablet      Current Outpatient Medications:    amitriptyline (ELAVIL) 25 MG tablet, Take 25 mg by mouth at bedtime. , Disp: , Rfl:    aspirin EC 81 MG tablet, Take 81 mg by mouth daily., Disp: , Rfl:    atenolol (TENORMIN) 50 MG tablet, TAKE 1 TABLET BY MOUTH DAILY. HOLD IF SYSTOLIC BLOOD PRESSURE IS LESS THEN OR IF HEART RATE IS LESS THAN 60 BPM (Patient taking differently: Take 50 mg by mouth daily.), Disp: 90 tablet, Rfl: 0   Cholecalciferol 50 MCG (2000 UT) CAPS, Take 2,000 Units by mouth daily. , Disp: , Rfl:    diclofenac sodium (VOLTAREN) 1 % GEL, Apply 4 g topically 4 (four) times daily. (Patient taking differently: Apply 4 g topically 4 (four) times daily as needed (pain).), Disp: 5 Tube, Rfl: 1   doxazosin (CARDURA) 4 MG tablet, Take 4 mg by mouth daily., Disp: , Rfl:    DULoxetine (CYMBALTA) 20 MG capsule, TAKE 1 CAPSULE(20 MG) BY MOUTH DAILY, Disp: 30 capsule, Rfl: 3   gabapentin (NEURONTIN) 300 MG capsule, TAKE 1 CAPSULE BY MOUTH THREE TIMES DAILY (Patient taking differently: Take 300 mg by mouth at bedtime.), Disp: 270 capsule, Rfl: 0   lubiprostone (AMITIZA) 8 MCG capsule, TAKE 1 CAPSULE BY MOUTH TWICE DAILY WITH FOOD AND WATER, Disp: , Rfl:    methocarbamol (ROBAXIN) 750 MG tablet, Take 750 mg by mouth at bedtime., Disp: , Rfl:    potassium chloride (KLOR-CON) 10 MEQ tablet, TAKE 1 TABLET BY MOUTH DAILY, Disp: 30 tablet, Rfl: 6   pyridoxine (B-6) 100 MG tablet, Take 100 mg by mouth daily., Disp: , Rfl:    rOPINIRole (REQUIP) 0.5 MG tablet, Take 1.5 mg by mouth at bedtime. , Disp: , Rfl:     rosuvastatin (CRESTOR) 40 MG tablet, Take 40 mg by mouth daily. , Disp: , Rfl:    sacubitril-valsartan (ENTRESTO) 49-51 MG, Take 1 tablet by mouth 2 (two) times daily., Disp: 180 tablet, Rfl: 0   tadalafil (CIALIS) 20 MG tablet, Take 1 tablet (20 mg total) by mouth daily as needed., Disp: 10 tablet, Rfl: 5   tamsulosin (FLOMAX) 0.4 MG CAPS capsule, Take 1 capsule (0.4 mg total) by mouth daily after supper., Disp: 90 capsule, Rfl: 3   thiamine (VITAMIN B-1) 100 MG tablet, Take 100 mg by mouth daily., Disp: , Rfl:    vitamin B-12 (CYANOCOBALAMIN) 1000 MCG tablet, Take 1,000 mcg by mouth daily. , Disp: , Rfl:    vitamin E 180 MG (400 UNITS) capsule, 1 capsule, Disp: , Rfl:    furosemide (LASIX) 20 MG tablet, Take 1 tablet (20 mg total) by mouth daily as needed for edema., Disp: 30 tablet, Rfl: 0  Current Facility-Administered Medications:    methylPREDNISolone acetate (DEPO-MEDROL) injection 80 mg, 80 mg, Intramuscular,  Once, Naida Sleight, PA-C  No orders of the defined types were placed in this encounter.  --Continue cardiac medications as reconciled in final medication list. --Return in about 3 months (around 06/14/2021) for Follow up, CAD, heart failure management.. Or sooner if needed. --Continue follow-up with your primary care physician regarding the management of your other chronic comorbid conditions.  Patient's questions and concerns were addressed to his satisfaction. He voices understanding of the instructions provided during this encounter.   This note was created using a voice recognition software as a result there may be grammatical errors inadvertently enclosed that do not reflect the nature of this encounter. Every attempt is made to correct such errors.   Tessa Lerner, [place], [facility]  Pager: [phone] Office: [phone]

## 2021-03-17 ENCOUNTER — Telehealth: Payer: Self-pay | Admitting: Pharmacist

## 2021-03-17 DIAGNOSIS — I119 Hypertensive heart disease without heart failure: Secondary | ICD-10-CM

## 2021-03-17 DIAGNOSIS — I255 Ischemic cardiomyopathy: Secondary | ICD-10-CM

## 2021-03-17 NOTE — Telephone Encounter (Signed)
BP readings over the last few days have continued to remain elevated at home. Avg BP of 178/94. Pt denies any complains of CP, HA, SOB, edema, numbness, neurofocal changes. Denies any recent changes in diet, medications, or lifestyle that may contributed to persistently elevated home BP readings. Denies any recent excessive salt intake, recent steroid use, any worsening pain or anxiety concerns. Pt reports taking his atenolol 50 mg, doxazosin 4 mg, lasix 20 mg PRN, Entresto 49/51 mg BID as directed without any missed or skipped doses. Pt open further titration of his CHF and HTN medications to reduce his risk of CHF hospitalization and mortality. May consider increasing Entresto dose to 97/103 mg BID and adding spironolactone 25 mg daily. Would consider also adding Farxiga in the future if BP readings remains stable. Pt to [facility] Card[facility].

## 2021-03-17 NOTE — Telephone Encounter (Signed)
Please increase the Entresto to 97/103 mg p.o. twice daily.  Please coordinate with the patient and to have labs done in 1 week to evaluate kidney function and electrolytes.

## 2021-03-18 MED ORDER — ENTRESTO 97-103 MG PO TABS: 1.0000 | ORAL_TABLET | Freq: Two times a day (BID) | ORAL | 3 refills | Status: DC

## 2021-03-18 MED ORDER — SPIRONOLACTONE 25 MG PO TABS: 12.5000 mg | ORAL_TABLET | Freq: Every day | ORAL | 2 refills | Status: DC

## 2021-03-18 NOTE — Telephone Encounter (Signed)
Reviewed with pt. Pt agreeable to take 2 tabs of his Entresto 49/51 mg BID till he runs out before starting on Entresto 91/103 mg BID. Pt also agreeable to start spironolactone 12.5 mg daily for added BP management. Pt to get repeat labs in 1 week. Pt denies any complains of HA, CP, SOB, edema, numbness, weakness, lightheadedness, dizziness. Will continue remote BP monitoring in the meantime. Rx and lab orders pended for approval

## 2021-03-28 MED ORDER — CARVEDILOL 12.5 MG PO TABS: 12.5000 mg | ORAL_TABLET | Freq: Two times a day (BID) | ORAL | 2 refills | Status: DC

## 2021-03-28 NOTE — Telephone Encounter (Signed)
Please change the Atenolol to Coreg 12.5mg  po bid.

## 2021-03-28 NOTE — Telephone Encounter (Signed)
Pt reports that he has been taking Entresto 97/103 mg BID and spironolactone 12.5 mg daily since 03/22/21. Pt planning on getting labs completed tomorrow. Home BP continues to be labile and elevated. Home BP readings over the past week of ~175/96. SBP ranging from 157-189. Pt continues to deny any complains of HA, CP, SOB, edema, lightheadedness, weakness, neurofocal changes. May consider changing atenolol 50 mg daily to labetalol 200 mg BID while waiting for lab works to be pended. Pt open to making medication changes to manage his persistently elevated home BP. May also consider adding BiDil if BP continues to remain elevated. Initially planned on adding Farxiga if labs WNL.

## 2021-03-28 NOTE — Addendum Note (Signed)
Addended by: Cassell Clement T on: 03/28/2021 02:09 PM   Modules accepted: Orders

## 2021-03-28 NOTE — Addendum Note (Signed)
Addended by: Durward Mallard on: 03/28/2021 03:54 PM   Modules accepted: Orders

## 2021-03-30 LAB — BASIC METABOLIC PANEL
BUN/Creatinine Ratio: 14 (ref 10–24)
BUN: 12 mg/dL (ref 8–27)
CO2: 26 mmol/L (ref 20–29)
Calcium: 9.3 mg/dL (ref 8.6–10.2)
Chloride: 105 mmol/L (ref 96–106)
Creatinine, Ser: 0.85 mg/dL (ref 0.76–1.27)
Glucose: 98 mg/dL (ref 70–99)
Potassium: 4.2 mmol/L (ref 3.5–5.2)
Sodium: 141 mmol/L (ref 134–144)
eGFR: 91 mL/min/{1.73_m2} (ref >59)

## 2021-03-30 NOTE — Progress Notes (Signed)
Lab results reviewed following increasing Entresto dose from 49/51 mg BID to 2 tabs of 49/51 mg BID and spironolactone 12.5 mg daily. Pt tolerating medications without any noted ADRs. Started carvedilol 12.5 mg BID this morning.Okay to continue current therapy and continue monitoring.

## 2021-04-14 ENCOUNTER — Other Ambulatory Visit: Payer: Self-pay

## 2021-04-14 ENCOUNTER — Encounter: Payer: Self-pay | Admitting: Cardiology

## 2021-04-14 ENCOUNTER — Ambulatory Visit: Payer: Medicare PPO | Admitting: Cardiology

## 2021-04-14 DIAGNOSIS — I119 Hypertensive heart disease without heart failure: Secondary | ICD-10-CM

## 2021-04-14 MED ORDER — CARVEDILOL 25 MG PO TABS: 25.0000 mg | ORAL_TABLET | Freq: Two times a day (BID) | ORAL | 0 refills | Status: DC

## 2021-04-14 NOTE — Progress Notes (Signed)
Brian Gallegos Date of Birth: [birth year] MRN: [id number] Primary Care Provider:Miller, Misty Stanley, MD Former Cardiology Providers: Altamese [place]acres, APRN, FNP-C Primary Cardiologist: Tessa Lerner, DO, [facility] (established care 12/30/2019)  Date: 04/14/21 Last Office Visit: 03/15/2021   Chief Complaint  Patient presents with   Hypertension    HPI  Brian Gallegos is a 75 y.o.  male who presents to the office with a chief complaint of " 1 month follow-up for blood pressure and cardiomyopathy." Patient's past medical history and cardiovascular risk factors include: Established coronary artery disease without angina pectoris, status post stenting in the RCA in April 2017, hypertension, hyperlipidemia, prediabetes, advanced age.  This patient is accompanied in the office by his  son Brian Gallegos .  Given his underlying coronary artery disease with prior PCI to the RCA he has been managed for underlying CAD and mildly reduced LVEF suggestive of ischemic cardiomyopathy.  He has undergone an echocardiogram in July 2021 which noted mildly reduced LVEF with global hypokinesis and subsequent nuclear stress test noted normal myocardial perfusion with reduced LVEF per gated SPECT.  The shared decision was to uptitrate GDMT.  He has been successfully weaned off of clonidine and doxazosin has been reduced from 8 mg p.o. daily to 4 mg p.o. daily.  And GDMT has been uptitrated.  Clinically patient is doing well from a cardiovascular standpoint.  However, looking at his ambulatory blood pressure readings his home blood pressures have been not well controlled.  Based on a 2-week average his blood pressure is 171/97 mmHg with a pulse of 79 bpm.  Patient states that he is been taking his medications as scheduled.  In contrast, patient is office blood pressures are very well controlled.  Patient has been given a new ambulatory blood pressure cuff as her readings and cuff are very similar to office blood pressure  numbers.  Patient denies any chest pain or anginal discomfort.  Denies any shortness of breath at rest or with effort related activities.  He is overall euvolemic and not in congestive heart failure.  And no hospitalizations for cardiovascular symptoms.  Patient states that since last office visit he did have difficulty voiding and had to be seen at the ER for further evaluation and management.  He has an appointment with urology this coming Tuesday.  I have informed him to discuss the need for doxazosin which was reduced from 8 mg to 4 mg during prior office visits to further uptitrate his cardiac medications.   ALLERGIES: Allergies  Allergen Reactions   Bisacodyl Other (See Comments)   Morphine Itching   Morphine Sulfate Itching   Other Other (See Comments)   Codeine Itching    Low amount is tolerated   Finasteride Hives and Rash   MEDICATION LIST PRIOR TO VISIT: Current Outpatient Medications on File Prior to Visit  Medication Sig Dispense Refill   amitriptyline (ELAVIL) 25 MG tablet Take 25 mg by mouth at bedtime.      aspirin EC 81 MG tablet Take 81 mg by mouth daily.     Cholecalciferol 50 MCG (2000 UT) CAPS Take 2,000 Units by mouth daily.      doxazosin (CARDURA) 4 MG tablet Take 4 mg by mouth daily.     DULoxetine (CYMBALTA) 20 MG capsule TAKE 1 CAPSULE(20 MG) BY MOUTH DAILY 30 capsule 3   furosemide (LASIX) 20 MG tablet Take 1 tablet (20 mg total) by mouth daily as needed for edema. 30 tablet 0   gabapentin (NEURONTIN) 300 MG  capsule TAKE 1 CAPSULE BY MOUTH THREE TIMES DAILY (Patient taking differently: Take 300 mg by mouth at bedtime.) 270 capsule 0   lubiprostone (AMITIZA) 8 MCG capsule TAKE 1 CAPSULE BY MOUTH TWICE DAILY WITH FOOD AND WATER     methocarbamol (ROBAXIN) 750 MG tablet Take 750 mg by mouth at bedtime.     potassium chloride (KLOR-CON) 10 MEQ tablet TAKE 1 TABLET BY MOUTH DAILY 30 tablet 6   pyridoxine (B-6) 100 MG tablet Take 100 mg by mouth daily.      rOPINIRole (REQUIP) 0.5 MG tablet Take 1.5 mg by mouth at bedtime.      rosuvastatin (CRESTOR) 40 MG tablet Take 40 mg by mouth daily.      sacubitril-valsartan (ENTRESTO) 97-103 MG Take 1 tablet by mouth 2 (two) times daily. 60 tablet 3   spironolactone (ALDACTONE) 25 MG tablet Take 0.5 tablets (12.5 mg total) by mouth daily. (Patient taking differently: Take 25 mg by mouth daily.) 45 tablet 2   tadalafil (CIALIS) 20 MG tablet Take 1 tablet (20 mg total) by mouth daily as needed. 10 tablet 5   tamsulosin (FLOMAX) 0.4 MG CAPS capsule Take 1 capsule (0.4 mg total) by mouth daily after supper. (Patient taking differently: Take 0.4 mg by mouth in the morning and at bedtime.) 90 capsule 3   thiamine (VITAMIN B-1) 100 MG tablet Take 100 mg by mouth daily.     vitamin B-12 (CYANOCOBALAMIN) 1000 MCG tablet Take 1,000 mcg by mouth daily.      vitamin E 180 MG (400 UNITS) capsule 1 capsule     diclofenac sodium (VOLTAREN) 1 % GEL Apply 4 g topically 4 (four) times daily. (Patient taking differently: Apply 4 g topically 4 (four) times daily as needed (pain).) 5 Tube 1   No current facility-administered medications on file prior to visit.    PAST MEDICAL HISTORY: Past Medical History:  Diagnosis Date   Arthritis    "all over"   Chronic lower back pain    Early cataracts, bilateral    Family history of adverse reaction to anesthesia    "son had ligament repair of elbow in 2016; woke up nauseated and disoriented"   Hypercholesterolemia    Hypertension    Migraine    "none in years" (10/12/2015)   Restless leg syndrome    Wears glasses     PAST SURGICAL HISTORY: Past Surgical History:  Procedure Laterality Date   ANTERIOR CERVICAL DECOMP/DISCECTOMY FUSION  ~ 2005   ANTERIOR CERVICAL DISCECTOMY     BACK SURGERY     CARDIAC CATHETERIZATION N/A 10/12/2015   Procedure: Left Heart Cath and Coronary Angiography;  Surgeon: Yates Decamp, MD;  Location: [facility] INVASIVE CV LAB;  Service: Cardiovascular;   Laterality: N/A;   CARDIAC CATHETERIZATION  10/12/2015   Procedure: Coronary Stent Intervention;  Surgeon: Yates Decamp, MD;  Location: [facility] INVASIVE CV LAB;  Service: Cardiovascular;;   COLONOSCOPY     CORONARY ANGIOPLASTY  10/12/2015   DES RCA   EYE SURGERY     FRACTURE SURGERY     GLAUCOMA SURGERY Left 03/2015   JOINT REPLACEMENT     KNEE ARTHROSCOPY Bilateral    LUMBAR DISC SURGERY  ~ 1986-02/08/2007 X 6   MAXIMUM ACCESS (MAS)POSTERIOR LUMBAR INTERBODY FUSION (PLIF) 3 LEVEL  02/08/2007   w/rods and screws   SPINAL CORD STIMULATOR INSERTION N/A 05/08/2014   Procedure: LUMBAR SPINAL CORD STIMULATOR IMPLANT ;  Surgeon: Gwynne Edinger, MD;  Location: [facility] NEURO ORS;  Service: Neurosurgery;  Laterality: N/A;   TOE FUSION Right 06/2014   "outside part of big toe"   TOTAL KNEE ARTHROPLASTY Left ~ 2011    FAMILY HISTORY: The patient's family history includes Aneurysm in his mother; Cancer in an other family member; Heart attack in his father; Hypertension in his father.   SOCIAL HISTORY:  The patient  reports that he has never smoked. He has never used smokeless tobacco. He reports that he does not currently use alcohol. He reports that he does not use drugs.  Review of Systems  Constitutional: Negative for chills and fever.  HENT:  Negative for hoarse voice and nosebleeds.   Eyes:  Negative for discharge, double vision and pain.  Cardiovascular:  Negative for chest pain, claudication, dyspnea on exertion, leg swelling, near-syncope, orthopnea, palpitations, paroxysmal nocturnal dyspnea and syncope.  Respiratory:  Negative for hemoptysis and shortness of breath.   Musculoskeletal:  Negative for muscle cramps and myalgias.  Gastrointestinal:  Negative for abdominal pain, constipation, diarrhea, hematemesis, hematochezia, melena, nausea and vomiting.  Neurological:  Negative for dizziness and light-headedness.   PHYSICAL EXAM: Vitals with BMI 04/14/2021 04/14/2021 03/15/2021  Height - 5\' 9"  5\' 9"    Weight - 202 lbs 13 oz 203 lbs  BMI - 29.93 29.96  Systolic 116 133  Diastolic 76 76 66  Pulse 91 61 78    CONSTITUTIONAL: Well-developed and well-nourished. No acute distress.  SKIN: Skin is warm and dry. No rash noted. No cyanosis. No pallor. No jaundice HEAD: Normocephalic and atraumatic.  EYES: No scleral icterus MOUTH/THROAT: Moist oral membranes.  NECK: No JVD present. No thyromegaly noted. LYMPHATIC: No visible cervical adenopathy.  CHEST Normal respiratory effort. No intercostal retractions  LUNGS: Clear to auscultation bilaterally.  No stridor. No wheezes. No rales.  CARDIOVASCULAR: Regular rate and rhythm, positive S1-S2, no murmurs rubs or gallops appreciated. ABDOMINAL: Soft, nontender, nondistended, positive bowel sounds in all 4 quadrants.  No apparent ascites.  EXTREMITIES: No peripheral edema, warm to touch bilaterally.  With 2+ dorsalis pedis and posterior tibial pulses. HEMATOLOGIC: No significant bruising NEUROLOGIC: Oriented to person, place, and time. Nonfocal. Normal muscle tone.  PSYCHIATRIC: Normal mood and affect. Normal behavior. Cooperative  CARDIAC DATABASE: EKG: 05/27/2019: Sinus bradycardia at 58 bpm, normal axis, PRWP cannot exclude anterior infarct old. No evidence of ischemia.  12/30/2019: Sinus  Bradycardia, 54bpm, without underlying injury pattern.  Echocardiogram: 01/19/2020: LVEF 45-50%, mild global hypokinesis, moderate LVH, grade 1 diastolic impairment, normal LAP, moderately dilated left atrium, mild AR, moderate MR.  Stress Testing:  Lexiscan/modified Bruce Tetrofosmin stress test 01/19/2020:  Lexiscan/modified Bruce nuclear stress test performed using 1-day protocol.  Stress EKG is non-diagnostic, as this is pharmacological stress test. 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  Normal myocardial perfusion. Stress LVEF calculated 44%, although visually appears normal.   Intermediate risk study due to reduced LVEF. Recommend clinical correlation.   Heart Catheterization: Coronary angiogram 10/12/2015: Normal LV systolic function. Moderate disease in the mid circumflex coronary artery of 50%. Diffuse coronary calcification involving the proximal LAD. Right coronary artery very large and dominant. Proximal segment has ulcerated 90% stenosis. Mild amount of calcification is also evident. Stenting proximal large dominant RCA for an ulcerated 90% stenosis to 0% with implantation of a 4.0 x 18 mm resolute DES.  Vascular imaging: Abdominal aortic duplex 05/30/2017: Focal plaque noted in the proximal, mid and distal aorta. The maximum aorta diameter is 2.82 cm (  dist). No AAA. Normal iliac velocity.  LABORATORY DATA: CBC Latest Ref Rng & Units 06/06/2020 04/29/2014 04/22/2010  WBC 4.0 - 10.5 K/uL 11.1(H) 6.5 13.3(H)  Hemoglobin 13.0 - 17.0 g/dL 03.4 74.2 5.9(D)  Hematocrit 39.0 - 52.0 % 43.4 38.4(L) 29.5(L)  Platelets 150 - 400 K/uL 217 210 146(L)    CMP Latest Ref Rng & Units 03/29/2021 03/14/2021 02/10/2021  Glucose 70 - 99 mg/dL 98 86 95  BUN 8 - 27 mg/dL 12 20 13   Creatinine 0.76 - 1.27 mg/dL 6.38 7.56  Sodium 134 - 144 mmol/L 141 143 141  Potassium 3.5 - 5.2 mmol/L 4.2 4.3 4.7  Chloride 96 - 106 mmol/L 105 104 102  CO2 20 - 29 mmol/L 26 26 25   Calcium 8.6 - 10.2 mg/dL 9.3 8.8 9.6  Total Protein 6.5 - 8.1 g/dL - - -  Total Bilirubin 0.3 - 1.2 mg/dL - - -  Alkaline Phos 38 - 126 U/L - - -  AST 15 - 41 U/L - - -  ALT 0 - 44 U/L - - -    Lipid Panel  No results found for: CHOL, TRIG, HDL, CHOLHDL, VLDL, LDLCALC, LDLDIRECT, LABVLDL  Lab Results  Component Value Date   HGBA1C  04/20/2010    5.6 (NOTE)                                                                       According to the ADA Clinical Practice Recommendations for 2011, when HbA1c is used as a screening test:   >=6.5%   Diagnostic of Diabetes Mellitus           (if abnormal result  is  confirmed)  5.7-6.4%   Increased risk of developing Diabetes Mellitus  References:Diagnosis and Classification of Diabetes Mellitus,Diabetes Care,2011,34(Suppl 1):S62-S69 and Standards of Medical Care in         Diabetes - 2011,Diabetes Care,2011,34  (Suppl 1):S11-S61.   No components found for: NTPROBNP No results found for: TSH  Cardiac Panel (last 3 results) No results for input(s): CKTOTAL, CKMB, TROPONINIHS, RELINDX in the last 72 hours.  IMPRESSION:    ICD-10-CM   1. Hypertension with heart disease  I11.9 carvedilol (COREG) 25 MG tablet       RECOMMENDATIONS: Brian Gallegos is a 75 y.o. male whose past medical history and cardiovascular risk factors include: Established coronary artery disease without angina pectoris, status post stenting in the RCA in April 2017, hypertension, hyperlipidemia, prediabetes, advanced age.  Ischemic cardiomyopathy: Last LVEF 45%. Overall euvolemic and not in congestive heart failure. Since last visit his 61 has been increased to 97/103 mg p.o. twice daily. Increase carvedilol to 25 mg p.o. twice daily. Will continue to trend his ambulatory blood pressure readings and further titrate GDMT. Uses Lasix on a as needed basis. May consider Farxiga at the next office visit.  Established coronary artery disease without angina pectoris and prior PCI: Chest pain-free. Last echocardiogram noted mildly reduced LVEF at 45-50%, mild global hypokinesis, grade 1 diastolic impairment.  Without any significant valvular heart disease. Myocardial perfusion imaging from July 2021 reported to be intermediate study due to reduced LVEF; however, no reversible myocardial ischemia. Focus on secondary prevention by improving his modifiable cardiovascular risk factors.  Medications reconciled.  Benign essential hypertension: Office blood pressures are very well controlled. Ambulatory blood pressure readings are not well controlled as discussed above. Patient  has been provided a new blood pressure cuff via principal care management. Medications reconciled. Patient is encouraged to checking his blood pressure on a daily basis of the medications can further be uptitrated. I have also asked him to discuss with his urologist with regards to further uptitrating his doxazosin to the original dose of 8 mg p.o. daily Of note, patient is successfully weaned off of clonidine during prior visits.  I  Hypercholesterolemia: Continue statin therapy.   Follow lipids. Currently managed by primary care provider. Patient denies myalgia or other side effects.  FINAL MEDICATION LIST END OF ENCOUNTER: Meds ordered this encounter  Medications   carvedilol (COREG) 25 MG tablet    Sig: Take 1 tablet (25 mg total) by mouth 2 (two) times daily.    Dispense:  60 tablet    Refill:  0     Medications Discontinued During This Encounter  Medication Reason   methylPREDNISolone acetate (DEPO-MEDROL) injection 80 mg Error   carvedilol (COREG) 12.5 MG tablet      Current Outpatient Medications:    amitriptyline (ELAVIL) 25 MG tablet, Take 25 mg by mouth at bedtime. , Disp: , Rfl:    aspirin EC 81 MG tablet, Take 81 mg by mouth daily., Disp: , Rfl:    Cholecalciferol 50 MCG (2000 UT) CAPS, Take 2,000 Units by mouth daily. , Disp: , Rfl:    doxazosin (CARDURA) 4 MG tablet, Take 4 mg by mouth daily., Disp: , Rfl:    DULoxetine (CYMBALTA) 20 MG capsule, TAKE 1 CAPSULE(20 MG) BY MOUTH DAILY, Disp: 30 capsule, Rfl: 3   furosemide (LASIX) 20 MG tablet, Take 1 tablet (20 mg total) by mouth daily as needed for edema., Disp: 30 tablet, Rfl: 0   gabapentin (NEURONTIN) 300 MG capsule, TAKE 1 CAPSULE BY MOUTH THREE TIMES DAILY (Patient taking differently: Take 300 mg by mouth at bedtime.), Disp: 270 capsule, Rfl: 0   lubiprostone (AMITIZA) 8 MCG capsule, TAKE 1 CAPSULE BY MOUTH TWICE DAILY WITH FOOD AND WATER, Disp: , Rfl:    methocarbamol (ROBAXIN) 750 MG tablet, Take 750 mg by  mouth at bedtime., Disp: , Rfl:    potassium chloride (KLOR-CON) 10 MEQ tablet, TAKE 1 TABLET BY MOUTH DAILY, Disp: 30 tablet, Rfl: 6   pyridoxine (B-6) 100 MG tablet, Take 100 mg by mouth daily., Disp: , Rfl:    rOPINIRole (REQUIP) 0.5 MG tablet, Take 1.5 mg by mouth at bedtime. , Disp: , Rfl:    rosuvastatin (CRESTOR) 40 MG tablet, Take 40 mg by mouth daily. , Disp: , Rfl:    sacubitril-valsartan (ENTRESTO) 97-103 MG, Take 1 tablet by mouth 2 (two) times daily., Disp: 60 tablet, Rfl: 3   spironolactone (ALDACTONE) 25 MG tablet, Take 0.5 tablets (12.5 mg total) by mouth daily. (Patient taking differently: Take 25 mg by mouth daily.), Disp: 45 tablet, Rfl: 2   tadalafil (CIALIS) 20 MG tablet, Take 1 tablet (20 mg total) by mouth daily as needed., Disp: 10 tablet, Rfl: 5   tamsulosin (FLOMAX) 0.4 MG CAPS capsule, Take 1 capsule (0.4 mg total) by mouth daily after supper. (Patient taking differently: Take 0.4 mg by mouth in the morning and at bedtime.), Disp: 90 capsule, Rfl: 3   thiamine (VITAMIN B-1) 100 MG tablet, Take 100 mg by mouth daily., Disp: , Rfl:    vitamin B-12 (  CYANOCOBALAMIN) 1000 MCG tablet, Take 1,000 mcg by mouth daily. , Disp: , Rfl:    vitamin E 180 MG (400 UNITS) capsule, 1 capsule, Disp: , Rfl:    carvedilol (COREG) 25 MG tablet, Take 1 tablet (25 mg total) by mouth 2 (two) times daily., Disp: 60 tablet, Rfl: 0   diclofenac sodium (VOLTAREN) 1 % GEL, Apply 4 g topically 4 (four) times daily. (Patient taking differently: Apply 4 g topically 4 (four) times daily as needed (pain).), Disp: 5 Tube, Rfl: 1  No orders of the defined types were placed in this encounter.  --Continue cardiac medications as reconciled in final medication list. --Return in about 4 weeks (around 05/12/2021) for Follow up, BP. Or sooner if needed. --Continue follow-up with your primary care physician regarding the management of your other chronic comorbid conditions.  Patient's questions and concerns were  addressed to his satisfaction. He voices understanding of the instructions provided during this encounter.   This note was created using a voice recognition software as a result there may be grammatical errors inadvertently enclosed that do not reflect the nature of this encounter. Every attempt is made to correct such errors.   Tessa Lerner, [place], [facility]  Pager: [phone] Office: [phone]

## 2021-04-19 ENCOUNTER — Encounter: Payer: Self-pay | Admitting: Urology

## 2021-04-19 ENCOUNTER — Ambulatory Visit: Payer: Medicare PPO | Admitting: Urology

## 2021-04-19 ENCOUNTER — Other Ambulatory Visit: Payer: Self-pay

## 2021-04-19 VITALS — BP 161/95 | HR 82

## 2021-04-19 DIAGNOSIS — R339 Retention of urine, unspecified: Secondary | ICD-10-CM | POA: Diagnosis not present

## 2021-04-19 DIAGNOSIS — N401 Enlarged prostate with lower urinary tract symptoms: Secondary | ICD-10-CM | POA: Diagnosis not present

## 2021-04-19 DIAGNOSIS — N138 Other obstructive and reflux uropathy: Secondary | ICD-10-CM | POA: Diagnosis not present

## 2021-04-19 LAB — BLADDER SCAN AMB NON-IMAGING: Scan Result: 80

## 2021-04-19 LAB — URINALYSIS, ROUTINE W REFLEX MICROSCOPIC
Bilirubin, UA: NEGATIVE
Glucose, UA: NEGATIVE
Ketones, UA: NEGATIVE
Leukocytes,UA: NEGATIVE
Nitrite, UA: NEGATIVE
Protein,UA: NEGATIVE
RBC, UA: NEGATIVE
Specific Gravity, UA: 1.025 (ref 1.005–1.030)
Urobilinogen, Ur: 0.2 mg/dL (ref 0.2–1.0)
pH, UA: 5 (ref 5.0–7.5)

## 2021-04-19 MED ORDER — CIPROFLOXACIN HCL 500 MG PO TABS
500.0000 mg | ORAL_TABLET | Freq: Once | ORAL | Status: AC
  Administered 2021-04-19: 500 mg via ORAL

## 2021-04-19 MED ORDER — TAMSULOSIN HCL 0.4 MG PO CAPS: 0.4000 mg | ORAL_CAPSULE | Freq: Two times a day (BID) | ORAL | 11 refills | Status: DC

## 2021-04-19 NOTE — H&P (View-Only) (Signed)
04/19/2021 3:13 PM   Brian Gallegos 07/07/1945 [id number]  Referring provider: Sigmund Hazel, MD [address] [place],  [place] [address]  No chief complaint on file.   HPI:    PMH: Past Medical History:  Diagnosis Date   Arthritis    "all over"   Chronic lower back pain    Early cataracts, bilateral    Family history of adverse reaction to anesthesia    "son had ligament repair of elbow in 2016; woke up nauseated and disoriented"   Hypercholesterolemia    Hypertension    Migraine    "none in years" (10/12/2015)   Restless leg syndrome    Wears glasses     Surgical History: Past Surgical History:  Procedure Laterality Date   ANTERIOR CERVICAL DECOMP/DISCECTOMY FUSION  ~ 2005   ANTERIOR CERVICAL DISCECTOMY     BACK SURGERY     CARDIAC CATHETERIZATION N/A 10/12/2015   Procedure: Left Heart Cath and Coronary Angiography;  Surgeon: Yates Decamp, MD;  Location: [facility] INVASIVE CV LAB;  Service: Cardiovascular;  Laterality: N/A;   CARDIAC CATHETERIZATION  10/12/2015   Procedure: Coronary Stent Intervention;  Surgeon: Yates Decamp, MD;  Location: [facility] INVASIVE CV LAB;  Service: Cardiovascular;;   COLONOSCOPY     CORONARY ANGIOPLASTY  10/12/2015   DES RCA   EYE SURGERY     FRACTURE SURGERY     GLAUCOMA SURGERY Left 03/2015   JOINT REPLACEMENT     KNEE ARTHROSCOPY Bilateral    LUMBAR DISC SURGERY  ~ 1986-02/08/2007 X 6   MAXIMUM ACCESS (MAS)POSTERIOR LUMBAR INTERBODY FUSION (PLIF) 3 LEVEL  02/08/2007   w/rods and screws   SPINAL CORD STIMULATOR INSERTION N/A 05/08/2014   Procedure: LUMBAR SPINAL CORD STIMULATOR IMPLANT ;  Surgeon: Gwynne Edinger, MD;  Location: [facility] NEURO ORS;  Service: Neurosurgery;  Laterality: N/A;   TOE FUSION Right 06/2014   "outside part of big toe"   TOTAL KNEE ARTHROPLASTY Left ~ 2011    Home Medications:  Allergies as of 04/19/2021       Reactions   Bisacodyl Other (See Comments)   Morphine Itching   Morphine Sulfate Itching   Other Other (See  Comments)   Codeine Itching   Low amount is tolerated   Finasteride Hives, Rash        Medication List        Accurate as of April 19, 2021  3:13 PM. If you have any questions, ask your nurse or doctor.          amitriptyline 25 MG tablet Commonly known as: ELAVIL Take 25 mg by mouth at bedtime.   aspirin EC 81 MG tablet Take 81 mg by mouth daily.   carvedilol 25 MG tablet Commonly known as: COREG Take 1 tablet (25 mg total) by mouth 2 (two) times daily.   Cholecalciferol 50 MCG (2000 UT) Caps Take 2,000 Units by mouth daily.   diclofenac sodium 1 % Gel Commonly known as: VOLTAREN Apply 4 g topically 4 (four) times daily. What changed:  when to take this reasons to take this   doxazosin 4 MG tablet Commonly known as: CARDURA Take 4 mg by mouth daily.   DULoxetine 20 MG capsule Commonly known as: CYMBALTA TAKE 1 CAPSULE(20 MG) BY MOUTH DAILY   Entresto 97-103 MG Generic drug: sacubitril-valsartan Take 1 tablet by mouth 2 (two) times daily.   furosemide 20 MG tablet Commonly known as: LASIX Take 1 tablet (20 mg total) by mouth daily  as needed for edema.   gabapentin 300 MG capsule Commonly known as: NEURONTIN TAKE 1 CAPSULE BY MOUTH THREE TIMES DAILY What changed: when to take this   ketoconazole 2 % cream Commonly known as: NIZORAL Apply topically 2 (two) times daily as needed.   lubiprostone 8 MCG capsule Commonly known as: AMITIZA TAKE 1 CAPSULE BY MOUTH TWICE DAILY WITH FOOD AND WATER   methocarbamol 750 MG tablet Commonly known as: ROBAXIN Take 750 mg by mouth at bedtime.   potassium chloride 10 MEQ tablet Commonly known as: KLOR-CON TAKE 1 TABLET BY MOUTH DAILY   pyridoxine 100 MG tablet Commonly known as: B-6 Take 100 mg by mouth daily.   rOPINIRole 0.5 MG tablet Commonly known as: REQUIP Take 1.5 mg by mouth at bedtime.   rosuvastatin 40 MG tablet Commonly known as: CRESTOR Take 40 mg by mouth daily.   spironolactone 25  MG tablet Commonly known as: ALDACTONE Take 0.5 tablets (12.5 mg total) by mouth daily. What changed: how much to take   tadalafil 20 MG tablet Commonly known as: CIALIS Take 1 tablet (20 mg total) by mouth daily as needed.   tamsulosin 0.4 MG Caps capsule Commonly known as: FLOMAX Take 1 capsule (0.4 mg total) by mouth daily after supper. What changed: when to take this   thiamine 100 MG tablet Commonly known as: Vitamin B-1 Take 100 mg by mouth daily.   vitamin B-12 1000 MCG tablet Commonly known as: CYANOCOBALAMIN Take 1,000 mcg by mouth daily.   vitamin E 180 MG (400 UNITS) capsule 1 capsule        Allergies:  Allergies  Allergen Reactions   Bisacodyl Other (See Comments)   Morphine Itching   Morphine Sulfate Itching   Other Other (See Comments)   Codeine Itching    Low amount is tolerated   Finasteride Hives and Rash    Family History: Family History  Problem Relation Age of Onset   Aneurysm Mother    Heart attack Father    Hypertension Father    Cancer Other     Social History:  reports that he has never smoked. He has never used smokeless tobacco. He reports that he does not currently use alcohol. He reports that he does not use drugs.  ROS: All other review of systems were reviewed and are negative except what is noted above in HPI  Physical Exam: BP (!) 161/95   Pulse 82   Constitutional:  Alert and oriented, No acute distress. HEENT: [place] AT, moist mucus membranes.  Trachea midline, no masses. Cardiovascular: No clubbing, cyanosis, or edema. Respiratory: Normal respiratory effort, no increased work of breathing. GI: Abdomen is soft, nontender, nondistended, no abdominal masses GU: No CVA tenderness.  Lymph: No cervical or inguinal lymphadenopathy. Skin: No rashes, bruises or suspicious lesions. Neurologic: Grossly intact, no focal deficits, moving all 4 extremities. Psychiatric: Normal mood and affect.  Laboratory Data: Lab Results   Component Value Date   WBC 11.1 (H) 06/06/2020   HGB 14.0 06/06/2020   HCT 43.4 06/06/2020   MCV 98.2 06/06/2020   PLT 217 06/06/2020    Lab Results  Component Value Date   CREATININE 0.85 03/29/2021    No results found for: PSA  Lab Results  Component Value Date   TESTOSTERONE 611 06/15/2020    Lab Results  Component Value Date   HGBA1C  04/20/2010    5.6 (NOTE)  According to the ADA Clinical Practice Recommendations for 2011, when HbA1c is used as a screening test:   >=6.5%   Diagnostic of Diabetes Mellitus           (if abnormal result  is confirmed)  5.7-6.4%   Increased risk of developing Diabetes Mellitus  References:Diagnosis and Classification of Diabetes Mellitus,Diabetes Care,2011,34(Suppl 1):S62-S69 and Standards of Medical Care in         Diabetes - 2011,Diabetes Care,2011,34  (Suppl 1):S11-S61.    Urinalysis    Component Value Date/Time   COLORURINE YELLOW 06/06/2020 1403   APPEARANCEUR Clear 01/28/2021 1001   LABSPEC 1.009 06/06/2020 1403   PHURINE 7.0 06/06/2020 1403   GLUCOSEU Negative 01/28/2021 1001   HGBUR MODERATE (A) 06/06/2020 1403   BILIRUBINUR Negative 01/28/2021 1001   KETONESUR NEGATIVE 06/06/2020 1403   PROTEINUR Negative 01/28/2021 1001   PROTEINUR 30 (A) 06/06/2020 1403   UROBILINOGEN 0.2 04/21/2010 1203   NITRITE Positive (A) 01/28/2021 1001   NITRITE NEGATIVE 06/06/2020 1403   LEUKOCYTESUR Trace (A) 01/28/2021 1001   LEUKOCYTESUR SMALL (A) 06/06/2020 1403    Lab Results  Component Value Date   LABMICR See below: 01/28/2021   WBCUA 11-30 (A) 01/28/2021   LABEPIT 0-10 01/28/2021   MUCUS Present 01/28/2021   BACTERIA Many (A) 01/28/2021    Pertinent Imaging:  No results found for this or any previous visit.  No results found for this or any previous visit.  No results found for this or any previous visit.  No results found for this or any previous  visit.  No results found for this or any previous visit.  No results found for this or any previous visit.  No results found for this or any previous visit.  No results found for this or any previous visit.   Assessment & Plan:    1. Benign prostatic hyperplasia with urinary obstruction We discussed the management of his BPH including continued medical therapy, Rezum, Urolift, TURP and simple prostatectomy. After discussing the options the patient has elected to proceed with Urolift. Risks/benefits/alternatives discussed.  - BLADDER SCAN AMB NON-IMAGING - Urinalysis, Routine w reflex microscopic  2. Urinary retention -Increase flomax to 0.4mg  BID   No follow-ups on file.  Wilkie Aye, MD  [facility] [place]      Cystoscopy Procedure Note  Patient identification was confirmed, informed consent was obtained, and patient was prepped using Betadine solution.  Lidocaine jelly was administered per urethral meatus.     Pre-Procedure: - Inspection reveals a normal caliber ureteral meatus.  Procedure: The flexible cystoscope was introduced without difficulty - No urethral strictures/lesions are present. - Enlarged prostate no median lobe - Normal bladder neck - Bilateral ureteral orifices identified - Bladder mucosa  reveals no ulcers, tumors, or lesions - No bladder stones - No trabeculation  Retroflexion shows no intravesical prostatic protrusion   Post-Procedure: - Patient tolerated the procedure well

## 2021-04-19 NOTE — Progress Notes (Signed)
Urological Symptom Review  Patient is experiencing the following symptoms: Burning/pain with urination Get up at night to urinate Erection problems (male only)   Review of Systems  Gastrointestinal (upper)  : Indigestion/heartburn  Gastrointestinal (lower) : Negative for lower GI symptoms  Constitutional : Negative for symptoms  Skin: Negative for skin symptoms  Eyes: Negative for eye symptoms  Ear/Nose/Throat : Negative for Ear/Nose/Throat symptoms  Hematologic/Lymphatic: Negative for Hematologic/Lymphatic symptoms  Cardiovascular : Negative for cardiovascular symptoms  Respiratory : Negative for respiratory symptoms  Endocrine: Negative for endocrine symptoms  Musculoskeletal: Back pain Joint pain  Neurological: Negative for neurological symptoms  Psychologic: Negative for psychiatric symptoms

## 2021-04-19 NOTE — Patient Instructions (Signed)
Benign Prostatic Hyperplasia Benign prostatic hyperplasia (BPH) is an enlarged prostate gland that is caused by the normal aging process and not by cancer. The prostate is a walnut-sized gland that is involved in the production of semen. It is located in front of the rectum and below the bladder. The bladder stores urine and the urethra is the tube that carries the urine out of the body. The prostate may get bigger as a man gets older. An enlarged prostate can press on the urethra. This can make it harder to pass urine. The build-up of urine in the bladder can cause infection. Back pressure and infection may progress to bladder damage and kidney (renal) failure. What are the causes? This condition is part of a normal aging process. However, not all men develop problems from this condition. If the prostate enlarges away from the urethra, urine flow will not be blocked. If it enlarges toward the urethra and compresses it, there will be problems passing urine. What increases the risk? This condition is more likely to develop in men over the age of 50 years. What are the signs or symptoms? Symptoms of this condition include: Getting up often during the night to urinate. Needing to urinate frequently during the day. Difficulty starting urine flow. Decrease in size and strength of your urine stream. Leaking (dribbling) after urinating. Inability to pass urine. This needs immediate treatment. Inability to completely empty your bladder. Pain when you pass urine. This is more common if there is also an infection. Urinary tract infection (UTI). How is this diagnosed? This condition is diagnosed based on your medical history, a physical exam, and your symptoms. Tests will also be done, such as: A post-void bladder scan. This measures any amount of urine that may remain in your bladder after you finish urinating. A digital rectal exam. In a rectal exam, your health care provider checks your prostate by  putting a lubricated, gloved finger into your rectum to feel the back of your prostate gland. This exam detects the size of your gland and any abnormal lumps or growths. An exam of your urine (urinalysis). A prostate specific antigen (PSA) screening. This is a blood test used to screen for prostate cancer. An ultrasound. This test uses sound waves to electronically produce a picture of your prostate gland. Your health care provider may refer you to a specialist in kidney and prostate diseases (urologist). How is this treated? Once symptoms begin, your health care provider will monitor your condition (active surveillance or watchful waiting). Treatment for this condition will depend on the severity of your condition. Treatment may include: Observation and yearly exams. This may be the only treatment needed if your condition and symptoms are mild. Medicines to relieve your symptoms, including: Medicines to shrink the prostate. Medicines to relax the muscle of the prostate. Surgery in severe cases. Surgery may include: Prostatectomy. In this procedure, the prostate tissue is removed completely through an open incision or with a laparoscope or robotics. Transurethral resection of the prostate (TURP). In this procedure, a tool is inserted through the opening at the tip of the penis (urethra). It is used to cut away tissue of the inner core of the prostate. The pieces are removed through the same opening of the penis. This removes the blockage. Transurethral incision (TUIP). In this procedure, small cuts are made in the prostate. This lessens the prostate's pressure on the urethra. Transurethral microwave thermotherapy (TUMT). This procedure uses microwaves to create heat. The heat destroys and removes a   small amount of prostate tissue. Transurethral needle ablation (TUNA). This procedure uses radio frequencies to destroy and remove a small amount of prostate tissue. Interstitial laser coagulation (ILC).  This procedure uses a laser to destroy and remove a small amount of prostate tissue. Transurethral electrovaporization (TUVP). This procedure uses electrodes to destroy and remove a small amount of prostate tissue. Prostatic urethral lift. This procedure inserts an implant to push the lobes of the prostate away from the urethra. Follow these instructions at home: Take over-the-counter and prescription medicines only as told by your health care provider. Monitor your symptoms for any changes. Contact your health care provider with any changes. Avoid drinking large amounts of liquid before going to bed or out in public. Avoid or reduce how much caffeine or alcohol you drink. Give yourself time when you urinate. Keep all follow-up visits as told by your health care provider. This is important. Contact a health care provider if: You have unexplained back pain. Your symptoms do not get better with treatment. You develop side effects from the medicine you are taking. Your urine becomes very dark or has a bad smell. Your lower abdomen becomes distended and you have trouble passing your urine. Get help right away if: You have a fever or chills. You suddenly cannot urinate. You feel lightheaded, or very dizzy, or you faint. There are large amounts of blood or clots in the urine. Your urinary problems become hard to manage. You develop moderate to severe low back or flank pain. The flank is the side of your body between the ribs and the hip. These symptoms may represent a serious problem that is an emergency. Do not wait to see if the symptoms will go away. Get medical help right away. Call your local emergency services (911 in the [place] Do not drive yourself to the hospital. Summary Benign prostatic hyperplasia (BPH) is an enlarged prostate that is caused by the normal aging process and not by cancer. An enlarged prostate can press on the urethra. This can make it hard to pass urine. This  condition is part of a normal aging process and is more likely to develop in men over the age of 50 years. Get help right away if you suddenly cannot urinate. This information is not intended to replace advice given to you by your health care provider. Make sure you discuss any questions you have with your health care provider. Document Revised: 09/29/2020 Document Reviewed: 02/26/2020 [employer] Patient Education  2022 [employer]  

## 2021-04-19 NOTE — Progress Notes (Signed)
04/19/2021 3:13 PM   Brian Gallegos 07/07/1945 [id number]  Referring provider: Sigmund Hazel, MD [address] [place],  [place] [address]  No chief complaint on file.   HPI:    PMH: Past Medical History:  Diagnosis Date   Arthritis    "all over"   Chronic lower back pain    Early cataracts, bilateral    Family history of adverse reaction to anesthesia    "son had ligament repair of elbow in 2016; woke up nauseated and disoriented"   Hypercholesterolemia    Hypertension    Migraine    "none in years" (10/12/2015)   Restless leg syndrome    Wears glasses     Surgical History: Past Surgical History:  Procedure Laterality Date   ANTERIOR CERVICAL DECOMP/DISCECTOMY FUSION  ~ 2005   ANTERIOR CERVICAL DISCECTOMY     BACK SURGERY     CARDIAC CATHETERIZATION N/A 10/12/2015   Procedure: Left Heart Cath and Coronary Angiography;  Surgeon: Yates Decamp, MD;  Location: [facility] INVASIVE CV LAB;  Service: Cardiovascular;  Laterality: N/A;   CARDIAC CATHETERIZATION  10/12/2015   Procedure: Coronary Stent Intervention;  Surgeon: Yates Decamp, MD;  Location: [facility] INVASIVE CV LAB;  Service: Cardiovascular;;   COLONOSCOPY     CORONARY ANGIOPLASTY  10/12/2015   DES RCA   EYE SURGERY     FRACTURE SURGERY     GLAUCOMA SURGERY Left 03/2015   JOINT REPLACEMENT     KNEE ARTHROSCOPY Bilateral    LUMBAR DISC SURGERY  ~ 1986-02/08/2007 X 6   MAXIMUM ACCESS (MAS)POSTERIOR LUMBAR INTERBODY FUSION (PLIF) 3 LEVEL  02/08/2007   w/rods and screws   SPINAL CORD STIMULATOR INSERTION N/A 05/08/2014   Procedure: LUMBAR SPINAL CORD STIMULATOR IMPLANT ;  Surgeon: Gwynne Edinger, MD;  Location: [facility] NEURO ORS;  Service: Neurosurgery;  Laterality: N/A;   TOE FUSION Right 06/2014   "outside part of big toe"   TOTAL KNEE ARTHROPLASTY Left ~ 2011    Home Medications:  Allergies as of 04/19/2021       Reactions   Bisacodyl Other (See Comments)   Morphine Itching   Morphine Sulfate Itching   Other Other (See  Comments)   Codeine Itching   Low amount is tolerated   Finasteride Hives, Rash        Medication List        Accurate as of April 19, 2021  3:13 PM. If you have any questions, ask your nurse or doctor.          amitriptyline 25 MG tablet Commonly known as: ELAVIL Take 25 mg by mouth at bedtime.   aspirin EC 81 MG tablet Take 81 mg by mouth daily.   carvedilol 25 MG tablet Commonly known as: COREG Take 1 tablet (25 mg total) by mouth 2 (two) times daily.   Cholecalciferol 50 MCG (2000 UT) Caps Take 2,000 Units by mouth daily.   diclofenac sodium 1 % Gel Commonly known as: VOLTAREN Apply 4 g topically 4 (four) times daily. What changed:  when to take this reasons to take this   doxazosin 4 MG tablet Commonly known as: CARDURA Take 4 mg by mouth daily.   DULoxetine 20 MG capsule Commonly known as: CYMBALTA TAKE 1 CAPSULE(20 MG) BY MOUTH DAILY   Entresto 97-103 MG Generic drug: sacubitril-valsartan Take 1 tablet by mouth 2 (two) times daily.   furosemide 20 MG tablet Commonly known as: LASIX Take 1 tablet (20 mg total) by mouth daily  as needed for edema.   gabapentin 300 MG capsule Commonly known as: NEURONTIN TAKE 1 CAPSULE BY MOUTH THREE TIMES DAILY What changed: when to take this   ketoconazole 2 % cream Commonly known as: NIZORAL Apply topically 2 (two) times daily as needed.   lubiprostone 8 MCG capsule Commonly known as: AMITIZA TAKE 1 CAPSULE BY MOUTH TWICE DAILY WITH FOOD AND WATER   methocarbamol 750 MG tablet Commonly known as: ROBAXIN Take 750 mg by mouth at bedtime.   potassium chloride 10 MEQ tablet Commonly known as: KLOR-CON TAKE 1 TABLET BY MOUTH DAILY   pyridoxine 100 MG tablet Commonly known as: B-6 Take 100 mg by mouth daily.   rOPINIRole 0.5 MG tablet Commonly known as: REQUIP Take 1.5 mg by mouth at bedtime.   rosuvastatin 40 MG tablet Commonly known as: CRESTOR Take 40 mg by mouth daily.   spironolactone 25  MG tablet Commonly known as: ALDACTONE Take 0.5 tablets (12.5 mg total) by mouth daily. What changed: how much to take   tadalafil 20 MG tablet Commonly known as: CIALIS Take 1 tablet (20 mg total) by mouth daily as needed.   tamsulosin 0.4 MG Caps capsule Commonly known as: FLOMAX Take 1 capsule (0.4 mg total) by mouth daily after supper. What changed: when to take this   thiamine 100 MG tablet Commonly known as: Vitamin B-1 Take 100 mg by mouth daily.   vitamin B-12 1000 MCG tablet Commonly known as: CYANOCOBALAMIN Take 1,000 mcg by mouth daily.   vitamin E 180 MG (400 UNITS) capsule 1 capsule        Allergies:  Allergies  Allergen Reactions   Bisacodyl Other (See Comments)   Morphine Itching   Morphine Sulfate Itching   Other Other (See Comments)   Codeine Itching    Low amount is tolerated   Finasteride Hives and Rash    Family History: Family History  Problem Relation Age of Onset   Aneurysm Mother    Heart attack Father    Hypertension Father    Cancer Other     Social History:  reports that he has never smoked. He has never used smokeless tobacco. He reports that he does not currently use alcohol. He reports that he does not use drugs.  ROS: All other review of systems were reviewed and are negative except what is noted above in HPI  Physical Exam: BP (!) 161/95   Pulse 82   Constitutional:  Alert and oriented, No acute distress. HEENT: [place] AT, moist mucus membranes.  Trachea midline, no masses. Cardiovascular: No clubbing, cyanosis, or edema. Respiratory: Normal respiratory effort, no increased work of breathing. GI: Abdomen is soft, nontender, nondistended, no abdominal masses GU: No CVA tenderness.  Lymph: No cervical or inguinal lymphadenopathy. Skin: No rashes, bruises or suspicious lesions. Neurologic: Grossly intact, no focal deficits, moving all 4 extremities. Psychiatric: Normal mood and affect.  Laboratory Data: Lab Results   Component Value Date   WBC 11.1 (H) 06/06/2020   HGB 14.0 06/06/2020   HCT 43.4 06/06/2020   MCV 98.2 06/06/2020   PLT 217 06/06/2020    Lab Results  Component Value Date   CREATININE 0.85 03/29/2021    No results found for: PSA  Lab Results  Component Value Date   TESTOSTERONE 611 06/15/2020    Lab Results  Component Value Date   HGBA1C  04/20/2010    5.6 (NOTE)                                                                         According to the ADA Clinical Practice Recommendations for 2011, when HbA1c is used as a screening test:   >=6.5%   Diagnostic of Diabetes Mellitus           (if abnormal result  is confirmed)  5.7-6.4%   Increased risk of developing Diabetes Mellitus  References:Diagnosis and Classification of Diabetes Mellitus,Diabetes Care,2011,34(Suppl 1):S62-S69 and Standards of Medical Care in         Diabetes - 2011,Diabetes Care,2011,34  (Suppl 1):S11-S61.    Urinalysis    Component Value Date/Time   COLORURINE YELLOW 06/06/2020 1403   APPEARANCEUR Clear 01/28/2021 1001   LABSPEC 1.009 06/06/2020 1403   PHURINE 7.0 06/06/2020 1403   GLUCOSEU Negative 01/28/2021 1001   HGBUR MODERATE (A) 06/06/2020 1403   BILIRUBINUR Negative 01/28/2021 1001   KETONESUR NEGATIVE 06/06/2020 1403   PROTEINUR Negative 01/28/2021 1001   PROTEINUR 30 (A) 06/06/2020 1403   UROBILINOGEN 0.2 04/21/2010 1203   NITRITE Positive (A) 01/28/2021 1001   NITRITE NEGATIVE 06/06/2020 1403   LEUKOCYTESUR Trace (A) 01/28/2021 1001   LEUKOCYTESUR SMALL (A) 06/06/2020 1403    Lab Results  Component Value Date   LABMICR See below: 01/28/2021   WBCUA 11-30 (A) 01/28/2021   LABEPIT 0-10 01/28/2021   MUCUS Present 01/28/2021   BACTERIA Many (A) 01/28/2021    Pertinent Imaging:  No results found for this or any previous visit.  No results found for this or any previous visit.  No results found for this or any previous visit.  No results found for this or any previous  visit.  No results found for this or any previous visit.  No results found for this or any previous visit.  No results found for this or any previous visit.  No results found for this or any previous visit.   Assessment & Plan:    1. Benign prostatic hyperplasia with urinary obstruction We discussed the management of his BPH including continued medical therapy, Rezum, Urolift, TURP and simple prostatectomy. After discussing the options the patient has elected to proceed with Urolift. Risks/benefits/alternatives discussed.  - BLADDER SCAN AMB NON-IMAGING - Urinalysis, Routine w reflex microscopic  2. Urinary retention -Increase flomax to 0.4mg BID   No follow-ups on file.  Saahir Prude, MD  [facility] [place]      Cystoscopy Procedure Note  Patient identification was confirmed, informed consent was obtained, and patient was prepped using Betadine solution.  Lidocaine jelly was administered per urethral meatus.     Pre-Procedure: - Inspection reveals a normal caliber ureteral meatus.  Procedure: The flexible cystoscope was introduced without difficulty - No urethral strictures/lesions are present. - Enlarged prostate no median lobe - Normal bladder neck - Bilateral ureteral orifices identified - Bladder mucosa  reveals no ulcers, tumors, or lesions - No bladder stones - No trabeculation  Retroflexion shows no intravesical prostatic protrusion   Post-Procedure: - Patient tolerated the procedure well     

## 2021-04-19 NOTE — Addendum Note (Signed)
Addended by: Gustavus Messing on: 04/19/2021 05:27 PM   Modules accepted: Orders

## 2021-04-28 NOTE — Patient Instructions (Signed)
Brian Gallegos  04/28/2021     @PREFPERIOPPHARMACY @   Your procedure is scheduled on  05/02/2021.   Report to 05/04/2021 at  [phone] A.M.   Call this number if you have problems the morning of surgery:  [phone]   Remember:  Do not eat or drink after midnight.      Take these medicines the morning of surgery with A SIP OF WATER                Carvedilol, cardura, cymbalta, robaxin (if needed), requip, entresto, flomax.     Do not wear jewelry, make-up or nail polish.  Do not wear lotions, powders, or perfumes, or deodorant.  Do not shave 48 hours prior to surgery.  Men may shave face and neck.  Do not bring valuables to the hospital.  [facility] is not responsible for any belongings or valuables.  Contacts, dentures or bridgework may not be worn into surgery.  Leave your suitcase in the car.  After surgery it may be brought to your room.  For patients admitted to the hospital, discharge time will be determined by your treatment team.  Patients discharged the day of surgery will not be allowed to drive home and must have someone with them for 24 hours.    Special instructions:   DO NOT smoke tobacco or vape for 24 hours before your procedure.  Please read over the following fact sheets that you were given. Coughing and Deep Breathing, Surgical Site Infection Prevention, Anesthesia Post-op Instructions, and Care and Recovery After Surgery       Prostatic Urethral Lift Prostatic urethral lift is a surgical procedure to relieve symptoms of prostate gland enlargement that occurs with age (benign prostatic hypertrophy, BPH). The part of the body that drains urine from the bladder (urethra) passes between the two lobes of the prostate. As the prostate enlarges, it can push on the urethra and cause problems with urinating. This procedure involves placing an implant that holds the prostate away from the urethra. The procedure is performed with a thin operating  telescope (cystoscope) that is inserted through the tip of the penis and moved up the urethra to the prostate. This is less invasive than other procedures that require an incision. You may have this procedure if: You have symptoms of BPH. Your prostate is not severely enlarged. Medicines to treat BPH are not working or not tolerated. You want to avoid possible sexual side effects from medicines or other procedures that are used to treat BPH. Tell a health care provider about: Any allergies you have. All medicines you are taking, including vitamins, herbs, eye drops, creams, and over-the-counter medicines. Previous problems you or members of your family have had with the use of anesthetics. Any blood disorders you have. Previous surgeries you have had. Any medical conditions you have. What are the risks? Bleeding. Infection. Leaking of urine (incontinence). Allergic reactions to medicines. Return of BPH symptoms after 2 years, requiring more treatment. What happens before the procedure? Ask your health care provider about: Changing or stopping your regular medicines. This is especially important if you are taking diabetes medicines or blood thinners. Taking medicines such as aspirin and ibuprofen. These medicines can thin your blood. Do not take these medicines before your procedure if your health care provider instructs you not to. Follow instructions from your health care provider about eating or drinking restrictions. Plan to have someone take you home from the hospital  or clinic. What happens during the procedure? To lower your risk of infection: Your health care team will wash or sanitize their hands. Your skin will be washed with soap. You may be given an antibiotic medicine. An IV may be inserted into one of your veins. You will be given one or more of the following: A medicine to help you relax (sedative). A medicine that is injected into your urethra to numb the area (local  anesthetic). A medicine to make you fall asleep (general anesthetic). A cystoscope will be inserted into your penis and moved through your urethra to your prostate. A device will be inserted through the cystoscope and used to press the lobes of your prostate away from your urethra. Implants will be inserted through the device to hold the lobes of your prostate in the widened position. The device and cystoscope will be removed. The procedure may vary among health care providers and hospitals. What happens after the procedure? Your blood pressure, heart rate, breathing rate, and blood oxygen level will be monitored until the medicines you were given have worn off. Do not drive for 24 hours if you were given a sedative. Summary Prostatic urethral lift is a surgical procedure to relieve symptoms of prostate gland enlargement that occurs with age (benign prostatic hypertrophy, BPH). The procedure is performed with a thin operating telescope (cystoscope) that is inserted through the tip of the penis and moved up the part of the body that drains urine from the bladder (urethra) to reach the prostate. This is less invasive than other procedures that require an incision. Plan to have someone take you home from the hospital or clinic. You will not be allowed to drive for 24 hours if you were given a sedative during the procedure. This information is not intended to replace advice given to you by your health care provider. Make sure you discuss any questions you have with your health care provider. Document Revised: 09/29/2020 Document Reviewed: 02/26/2020 Elsevier Patient Education  2022 [employer] General Anesthesia, Adult, Care After This sheet gives you information about how to care for yourself after your procedure. Your health care provider may also give you more specific instructions. If you have problems or questions, contact your health care provider. What can I expect after the  procedure? After the procedure, the following side effects are common: Pain or discomfort at the IV site. Nausea. Vomiting. Sore throat. Trouble concentrating. Feeling cold or chills. Feeling weak or tired. Sleepiness and fatigue. Soreness and body aches. These side effects can affect parts of the body that were not involved in surgery. Follow these instructions at home: For the time period you were told by your health care provider:  Rest. Do not participate in activities where you could fall or become injured. Do not drive or use machinery. Do not drink alcohol. Do not take sleeping pills or medicines that cause drowsiness. Do not make important decisions or sign legal documents. Do not take care of children on your own. Eating and drinking Follow any instructions from your health care provider about eating or drinking restrictions. When you feel hungry, start by eating small amounts of foods that are soft and easy to digest (bland), such as toast. Gradually return to your regular diet. Drink enough fluid to keep your urine pale yellow. If you vomit, rehydrate by drinking water, juice, or clear broth. General instructions If you have sleep apnea, surgery and certain medicines can increase your risk for breathing problems. Follow instructions from  your health care provider about wearing your sleep device: Anytime you are sleeping, including during daytime naps. While taking prescription pain medicines, sleeping medicines, or medicines that make you drowsy. Have a responsible adult stay with you for the time you are told. It is important to have someone help care for you until you are awake and alert. Return to your normal activities as told by your health care provider. Ask your health care provider what activities are safe for you. Take over-the-counter and prescription medicines only as told by your health care provider. If you smoke, do not smoke without supervision. Keep all  follow-up visits as told by your health care provider. This is important. Contact a health care provider if: You have nausea or vomiting that does not get better with medicine. You cannot eat or drink without vomiting. You have pain that does not get better with medicine. You are unable to pass urine. You develop a skin rash. You have a fever. You have redness around your IV site that gets worse. Get help right away if: You have difficulty breathing. You have chest pain. You have blood in your urine or stool, or you vomit blood. Summary After the procedure, it is common to have a sore throat or nausea. It is also common to feel tired. Have a responsible adult stay with you for the time you are told. It is important to have someone help care for you until you are awake and alert. When you feel hungry, start by eating small amounts of foods that are soft and easy to digest (bland), such as toast. Gradually return to your regular diet. Drink enough fluid to keep your urine pale yellow. Return to your normal activities as told by your health care provider. Ask your health care provider what activities are safe for you. This information is not intended to replace advice given to you by your health care provider. Make sure you discuss any questions you have with your health care provider. Document Revised: 03/04/2020 Document Reviewed: 10/02/2019 Elsevier Patient Education  2022 [employer] Inc. How to Use Chlorhexidine for Bathing Chlorhexidine gluconate (CHG) is a germ-killing (antiseptic) solution that is used to clean the skin. It can get rid of the bacteria that normally live on the skin and can keep them away for about 24 hours. To clean your skin with CHG, you may be given: A CHG solution to use in the shower or as part of a sponge bath. A prepackaged cloth that contains CHG. Cleaning your skin with CHG may help lower the risk for infection: While you are staying in the intensive care unit  of the hospital. If you have a vascular access, such as a central line, to provide short-term or long-term access to your veins. If you have a catheter to drain urine from your bladder. If you are on a ventilator. A ventilator is a machine that helps you breathe by moving air in and out of your lungs. After surgery. What are the risks? Risks of using CHG include: A skin reaction. Hearing loss, if CHG gets in your ears and you have a perforated eardrum. Eye injury, if CHG gets in your eyes and is not rinsed out. The CHG product catching fire. Make sure that you avoid smoking and flames after applying CHG to your skin. Do not use CHG: If you have a chlorhexidine allergy or have previously reacted to chlorhexidine. On babies younger than 63 months of age. How to use CHG solution Use CHG  only as told by your health care provider, and follow the instructions on the label. Use the full amount of CHG as directed. Usually, this is one bottle. During a shower Follow these steps when using CHG solution during a shower (unless your health care provider gives you different instructions): Start the shower. Use your normal soap and shampoo to wash your face and hair. Turn off the shower or move out of the shower stream. Pour the CHG onto a clean washcloth. Do not use any type of brush or rough-edged sponge. Starting at your neck, lather your body down to your toes. Make sure you follow these instructions: If you will be having surgery, pay special attention to the part of your body where you will be having surgery. Scrub this area for at least 1 minute. Do not use CHG on your head or face. If the solution gets into your ears or eyes, rinse them well with water. Avoid your genital area. Avoid any areas of skin that have broken skin, cuts, or scrapes. Scrub your back and under your arms. Make sure to wash skin folds. Let the lather sit on your skin for 1-2 minutes or as long as told by your health care  provider. Thoroughly rinse your entire body in the shower. Make sure that all body creases and crevices are rinsed well. Dry off with a clean towel. Do not put any substances on your body afterward--such as powder, lotion, or perfume--unless you are told to do so by your health care provider. Only use lotions that are recommended by the manufacturer. Put on clean clothes or pajamas. If it is the night before your surgery, sleep in clean sheets.  During a sponge bath Follow these steps when using CHG solution during a sponge bath (unless your health care provider gives you different instructions): Use your normal soap and shampoo to wash your face and hair. Pour the CHG onto a clean washcloth. Starting at your neck, lather your body down to your toes. Make sure you follow these instructions: If you will be having surgery, pay special attention to the part of your body where you will be having surgery. Scrub this area for at least 1 minute. Do not use CHG on your head or face. If the solution gets into your ears or eyes, rinse them well with water. Avoid your genital area. Avoid any areas of skin that have broken skin, cuts, or scrapes. Scrub your back and under your arms. Make sure to wash skin folds. Let the lather sit on your skin for 1-2 minutes or as long as told by your health care provider. Using a different clean, wet washcloth, thoroughly rinse your entire body. Make sure that all body creases and crevices are rinsed well. Dry off with a clean towel. Do not put any substances on your body afterward--such as powder, lotion, or perfume--unless you are told to do so by your health care provider. Only use lotions that are recommended by the manufacturer. Put on clean clothes or pajamas. If it is the night before your surgery, sleep in clean sheets. How to use CHG prepackaged cloths Only use CHG cloths as told by your health care provider, and follow the instructions on the label. Use the  CHG cloth on clean, dry skin. Do not use the CHG cloth on your head or face unless your health care provider tells you to. When washing with the CHG cloth: Avoid your genital area. Avoid any areas of skin that have  broken skin, cuts, or scrapes. Before surgery Follow these steps when using a CHG cloth to clean before surgery (unless your health care provider gives you different instructions): Using the CHG cloth, vigorously scrub the part of your body where you will be having surgery. Scrub using a back-and-forth motion for 3 minutes. The area on your body should be completely wet with CHG when you are done scrubbing. Do not rinse. Discard the cloth and let the area air-dry. Do not put any substances on the area afterward, such as powder, lotion, or perfume. Put on clean clothes or pajamas. If it is the night before your surgery, sleep in clean sheets.  For general bathing Follow these steps when using CHG cloths for general bathing (unless your health care provider gives you different instructions). Use a separate CHG cloth for each area of your body. Make sure you wash between any folds of skin and between your fingers and toes. Wash your body in the following order, switching to a new cloth after each step: The front of your neck, shoulders, and chest. Both of your arms, under your arms, and your hands. Your stomach and groin area, avoiding the genitals. Your right leg and foot. Your left leg and foot. The back of your neck, your back, and your buttocks. Do not rinse. Discard the cloth and let the area air-dry. Do not put any substances on your body afterward--such as powder, lotion, or perfume--unless you are told to do so by your health care provider. Only use lotions that are recommended by the manufacturer. Put on clean clothes or pajamas. Contact a health care provider if: Your skin gets irritated after scrubbing. You have questions about using your solution or cloth. You swallow  any chlorhexidine. Call your local poison control center [phone] in the [place] Get help right away if: Your eyes itch badly, or they become very red or swollen. Your skin itches badly and is red or swollen. Your hearing changes. You have trouble seeing. You have swelling or tingling in your mouth or throat. You have trouble breathing. These symptoms may represent a serious problem that is an emergency. Do not wait to see if the symptoms will go away. Get medical help right away. Call your local emergency services (911 in the [place] Do not drive yourself to the hospital. Summary Chlorhexidine gluconate (CHG) is a germ-killing (antiseptic) solution that is used to clean the skin. Cleaning your skin with CHG may help to lower your risk for infection. You may be given CHG to use for bathing. It may be in a bottle or in a prepackaged cloth to use on your skin. Carefully follow your health care provider's instructions and the instructions on the product label. Do not use CHG if you have a chlorhexidine allergy. Contact your health care provider if your skin gets irritated after scrubbing. This information is not intended to replace advice given to you by your health care provider. Make sure you discuss any questions you have with your health care provider. Document Revised: 08/30/2020 Document Reviewed: 08/30/2020 Elsevier Patient Education  2022 [employer].

## 2021-04-29 ENCOUNTER — Encounter (HOSPITAL_COMMUNITY): Payer: Self-pay

## 2021-04-29 ENCOUNTER — Other Ambulatory Visit: Payer: Self-pay

## 2021-04-29 ENCOUNTER — Encounter (HOSPITAL_COMMUNITY)
Admission: RE | Admit: 2021-04-29 | Discharge: 2021-04-29 | Disposition: A | Discharge status: Home / Self Care | Payer: Medicare PPO | Source: Ambulatory Visit | Attending: Urology | Admitting: Urology

## 2021-05-02 ENCOUNTER — Ambulatory Visit (HOSPITAL_COMMUNITY): Payer: Medicare PPO | Admitting: Anesthesiology

## 2021-05-02 ENCOUNTER — Encounter (HOSPITAL_COMMUNITY)
Admission: RE | Disposition: A | Discharge status: Home / Self Care | Payer: Self-pay | Source: Home / Self Care | Attending: Urology

## 2021-05-02 ENCOUNTER — Encounter (HOSPITAL_COMMUNITY): Payer: Self-pay | Admitting: Urology

## 2021-05-02 ENCOUNTER — Ambulatory Visit (HOSPITAL_COMMUNITY)
Admission: RE | Admit: 2021-05-02 | Discharge: 2021-05-02 | Disposition: A | Discharge status: Home / Self Care | Payer: Medicare PPO | Attending: Urology | Admitting: Urology

## 2021-05-02 DIAGNOSIS — Z955 Presence of coronary angioplasty implant and graft: Secondary | ICD-10-CM | POA: Diagnosis not present

## 2021-05-02 DIAGNOSIS — Z79899 Other long term (current) drug therapy: Secondary | ICD-10-CM | POA: Insufficient documentation

## 2021-05-02 DIAGNOSIS — N401 Enlarged prostate with lower urinary tract symptoms: Secondary | ICD-10-CM | POA: Insufficient documentation

## 2021-05-02 DIAGNOSIS — Z96652 Presence of left artificial knee joint: Secondary | ICD-10-CM | POA: Diagnosis not present

## 2021-05-02 DIAGNOSIS — N138 Other obstructive and reflux uropathy: Secondary | ICD-10-CM | POA: Diagnosis not present

## 2021-05-02 DIAGNOSIS — Z885 Allergy status to narcotic agent status: Secondary | ICD-10-CM | POA: Insufficient documentation

## 2021-05-02 DIAGNOSIS — Z7982 Long term (current) use of aspirin: Secondary | ICD-10-CM | POA: Insufficient documentation

## 2021-05-02 DIAGNOSIS — Z888 Allergy status to other drugs, medicaments and biological substances status: Secondary | ICD-10-CM | POA: Diagnosis not present

## 2021-05-02 HISTORY — PX: CYSTOSCOPY WITH INSERTION OF UROLIFT: SHX6678

## 2021-05-02 SURGERY — CYSTOSCOPY WITH INSERTION OF UROLIFT
Anesthesia: General | Site: Prostate

## 2021-05-02 MED ORDER — ORAL CARE MOUTH RINSE: 15.0000 mL | Freq: Once | OROMUCOSAL | Status: AC

## 2021-05-02 MED ORDER — HYDROMORPHONE HCL 1 MG/ML IJ SOLN
INTRAMUSCULAR | Status: AC
  Filled 2021-05-02: qty 1

## 2021-05-02 MED ORDER — OXYCODONE HCL 5 MG PO TABS
ORAL_TABLET | ORAL | Status: AC
  Filled 2021-05-02: qty 1

## 2021-05-02 MED ORDER — CHLORHEXIDINE GLUCONATE 0.12 % MT SOLN
15.0000 mL | Freq: Once | OROMUCOSAL | Status: AC
  Administered 2021-05-02: 15 mL via OROMUCOSAL

## 2021-05-02 MED ORDER — WATER FOR IRRIGATION, STERILE IR SOLN
Status: DC | PRN
  Administered 2021-05-02: 1000 mL

## 2021-05-02 MED ORDER — BELLADONNA ALKALOIDS-OPIUM 16.2-60 MG RE SUPP
RECTAL | Status: AC
  Filled 2021-05-02: qty 1

## 2021-05-02 MED ORDER — FENTANYL CITRATE (PF) 100 MCG/2ML IJ SOLN
INTRAMUSCULAR | Status: AC
  Filled 2021-05-02: qty 2

## 2021-05-02 MED ORDER — ONDANSETRON HCL 4 MG/2ML IJ SOLN
INTRAMUSCULAR | Status: DC | PRN
  Administered 2021-05-02: 4 mg via INTRAVENOUS

## 2021-05-02 MED ORDER — BELLADONNA ALKALOIDS-OPIUM 16.2-60 MG RE SUPP
1.0000 | Freq: Once | RECTAL | Status: AC
  Administered 2021-05-02: 1 via RECTAL

## 2021-05-02 MED ORDER — CEFAZOLIN SODIUM-DEXTROSE 2-4 GM/100ML-% IV SOLN
INTRAVENOUS | Status: AC
  Filled 2021-05-02: qty 100

## 2021-05-02 MED ORDER — ONDANSETRON HCL 4 MG/2ML IJ SOLN: 4.0000 mg | Freq: Once | INTRAMUSCULAR | Status: DC | PRN

## 2021-05-02 MED ORDER — OXYCODONE HCL 5 MG PO TABS
5.0000 mg | ORAL_TABLET | Freq: Once | ORAL | Status: AC
  Administered 2021-05-02: 5 mg via ORAL

## 2021-05-02 MED ORDER — FENTANYL CITRATE (PF) 100 MCG/2ML IJ SOLN
INTRAMUSCULAR | Status: DC | PRN
  Administered 2021-05-02 (×2): 50 ug via INTRAVENOUS

## 2021-05-02 MED ORDER — CEFAZOLIN SODIUM-DEXTROSE 2-4 GM/100ML-% IV SOLN
2.0000 g | INTRAVENOUS | Status: AC
  Administered 2021-05-02: 2 g via INTRAVENOUS

## 2021-05-02 MED ORDER — FENTANYL CITRATE PF 50 MCG/ML IJ SOSY
25.0000 ug | PREFILLED_SYRINGE | INTRAMUSCULAR | Status: DC | PRN
  Administered 2021-05-02 (×4): 50 ug via INTRAVENOUS
  Filled 2021-05-02 (×2): qty 1

## 2021-05-02 MED ORDER — PROPOFOL 10 MG/ML IV BOLUS
INTRAVENOUS | Status: DC | PRN
  Administered 2021-05-02: 120 mg via INTRAVENOUS

## 2021-05-02 MED ORDER — HYDROMORPHONE HCL 1 MG/ML IJ SOLN
INTRAMUSCULAR | Status: AC
  Filled 2021-05-02: qty 0.5

## 2021-05-02 MED ORDER — LIDOCAINE 2% (20 MG/ML) 5 ML SYRINGE
INTRAMUSCULAR | Status: DC | PRN
  Administered 2021-05-02: 60 mg via INTRAVENOUS

## 2021-05-02 MED ORDER — HYDROMORPHONE HCL 1 MG/ML IJ SOLN
0.2500 mg | INTRAMUSCULAR | Status: DC | PRN
  Administered 2021-05-02 (×5): 0.5 mg via INTRAVENOUS

## 2021-05-02 MED ORDER — LACTATED RINGERS IV SOLN
INTRAVENOUS | Status: DC
  Administered 2021-05-02: 1000 mL via INTRAVENOUS

## 2021-05-02 SURGICAL SUPPLY — 18 items
BAG DRAIN URO TABLE W/ADPT NS (BAG) ×2 IMPLANT
BAG DRN 8 ADPR NS SKTRN CSTL (BAG) ×1
BAG HAMPER (MISCELLANEOUS) ×2 IMPLANT
CLOTH BEACON ORANGE TIMEOUT ST (SAFETY) ×2 IMPLANT
GLOVE SURG POLYISO LF SZ8 (GLOVE) ×2 IMPLANT
GLOVE SURG UNDER POLY LF SZ7 (GLOVE) ×4 IMPLANT
GOWN STRL REUS W/TWL LRG LVL3 (GOWN DISPOSABLE) ×2 IMPLANT
GOWN STRL REUS W/TWL XL LVL3 (GOWN DISPOSABLE) ×2 IMPLANT
KIT TURNOVER CYSTO (KITS) ×2 IMPLANT
MANIFOLD NEPTUNE II (INSTRUMENTS) ×2 IMPLANT
PACK CYSTO (CUSTOM PROCEDURE TRAY) ×2 IMPLANT
PAD ARMBOARD 7.5X6 YLW CONV (MISCELLANEOUS) ×2 IMPLANT
SYSTEM UROLIFT (Male Continence) ×12 IMPLANT
TOWEL OR 17X26 4PK STRL BLUE (TOWEL DISPOSABLE) ×2 IMPLANT
TRAY FOLEY W/BAG SLVR 16FR (SET/KITS/TRAYS/PACK) ×2
TRAY FOLEY W/BAG SLVR 16FR ST (SET/KITS/TRAYS/PACK) ×1 IMPLANT
WATER STERILE IRR 3000ML UROMA (IV SOLUTION) ×2 IMPLANT
WATER STERILE IRR 500ML POUR (IV SOLUTION) ×2 IMPLANT

## 2021-05-02 NOTE — Transfer of Care (Signed)
Immediate Anesthesia Transfer of Care Note  Patient: Brian Gallegos  Procedure(s) Performed: CYSTOSCOPY WITH INSERTION OF UROLIFT (Prostate)  Patient Location: PACU  Anesthesia Type:General  Level of Consciousness: awake, alert  and oriented  Airway & Oxygen Therapy: Patient Spontanous Breathing  Post-op Assessment: Report given to RN, Post -op Vital signs reviewed and stable and Patient moving all extremities X 4  Post vital signs: Reviewed and stable  Last Vitals:  Vitals Value Taken Time  BP 189/93 05/02/21 1111  Temp    Pulse    Resp 11 05/02/21 1112  SpO2    Vitals shown include unvalidated device data.  Last Pain:  Vitals:   05/02/21 0944  TempSrc: Oral  PainSc: 0-No pain      Patients Stated Pain Goal: 6 (05/02/21 0944)  Complications: No notable events documented.

## 2021-05-02 NOTE — Anesthesia Postprocedure Evaluation (Signed)
Anesthesia Post Note  Patient: Brian Gallegos  Procedure(s) Performed: CYSTOSCOPY WITH INSERTION OF UROLIFT (Prostate)  Patient location during evaluation: Phase II Anesthesia Type: General Level of consciousness: awake Pain management: pain level controlled Vital Signs Assessment: post-procedure vital signs reviewed and stable Respiratory status: spontaneous breathing and respiratory function stable Cardiovascular status: blood pressure returned to baseline and stable Postop Assessment: no headache and no apparent nausea or vomiting Anesthetic complications: no Comments: Late entry   No notable events documented.   Last Vitals:  Vitals:   05/02/21 1330 05/02/21 1355  BP: (!) 158/93 (!) 170/88  Pulse: 92 87  Resp: 17 18  Temp:  36.7 C  SpO2: 94% 93%    Last Pain:  Vitals:   05/02/21 1355  TempSrc: Oral  PainSc: 7                  Windell Norfolk

## 2021-05-02 NOTE — Anesthesia Procedure Notes (Signed)
Procedure Name: LMA Insertion Date/Time: 05/02/2021 10:48 AM Performed by: Lenox Ahr, CRNA Pre-anesthesia Checklist: Patient identified, Patient being monitored, Emergency Drugs available, Timeout performed and Suction available Patient Re-evaluated:Patient Re-evaluated prior to induction Oxygen Delivery Method: Circle System Utilized Preoxygenation: Pre-oxygenation with 100% oxygen Induction Type: IV induction Ventilation: Mask ventilation without difficulty LMA: LMA inserted LMA Size: 5.0 Number of attempts: 1 Placement Confirmation: positive ETCO2 and breath sounds checked- equal and bilateral

## 2021-05-02 NOTE — Op Note (Signed)
   PREOPERATIVE DIAGNOSIS:  Benign prostatic hypertrophy with bladder outlet obstruction.  POSTOPERATIVE DIAGNOSIS:  Benign prostatic hypertrophy with bladder outlet obstruction.  PROCEDURE:  Cystoscopy with implantation of UroLift devices, 6 implants.  SURGEON:  Wilkie Aye, M.D.  ANESTHESIA:  General  ANTIBIOTICS: ancef  SPECIMEN:  None.  DRAINS:  A 16-French Foley catheter.  BLOOD LOSS:  Minimal.  COMPLICATIONS:  None.  INDICATIONS: The Patient is an 75 year old male with BPH and bladder outlet obstruction.  He has failed medical therapy and has elected UroLift for definitive treatment.  FINDINGS OF PROCEDURE:  He was taken to the operating room where a genral anesthetic was induced.  He was placed in lithotomy position and was fitted with PAS hose.  His perineum and genitalia were prepped with chlorhexidine, and he was draped in usual sterile fashion.  Cystoscopy was performed using the UroLift scope and 0 degree lens. Examination revealed a normal urethra.  The external sphincter was intact.  Prostatic urethra was approximately 5 cm in length with lateral lobe enlargement. There was also little bit of bladder neck elevation. Inspection of bladder revealed mild-to-moderate trabeculation with no tumors, stones, or inflammation.  No cellules or diverticula were noted. Ureteral orifices were in their normal anatomic position effluxing clear urine.  After initial cystoscopy, the visual obturator was replaced with the first UroLift device.  This was turned to the 9 o'clock position and pulled back to the veru and then slightly advanced.  Pressure was then applied to the right lateral lobe and the UroLift device was deployed.  The second UroLift device was then inserted and applied to the left lateral lobe at 3 o'clock and deployed in the mid prostatic urethra. After this, there was still some apparent obstruction closer to the bladder neck.  So a second and third  level of UroLift your left device was applied between the mid urethra and the proximal urethra providing further patency to the prostatic urethra. The scope was removed anda 16-French Foley catheter was inserted without difficulty. The balloon was filled with 10 mL sterile fluid, and the catheter was placed to straight drainage.  COMPLICATIONS: None   CONDITION: Stable, extubated, transferred to PACU  PLAN: The patient will be discharged home and followup in 2 days for a voiding trial.

## 2021-05-02 NOTE — Interval H&P Note (Signed)
History and Physical Interval Note:  05/02/2021 10:19 AM  Brian Gallegos  has presented today for surgery, with the diagnosis of BPH with LUTS urinary retention.  The various methods of treatment have been discussed with the patient and family. After consideration of risks, benefits and other options for treatment, the patient has consented to  Procedure(s): CYSTOSCOPY WITH INSERTION OF UROLIFT (N/A) as a surgical intervention.  The patient's history has been reviewed, patient examined, no change in status, stable for surgery.  I have reviewed the patient's chart and labs.  Questions were answered to the patient's satisfaction.     Wilkie Aye

## 2021-05-02 NOTE — Anesthesia Preprocedure Evaluation (Signed)
Anesthesia Evaluation  Patient identified by MRN, date of birth, ID band Patient awake    Reviewed: Allergy & Precautions, H&P , NPO status , Patient's Chart, lab work & pertinent test results, reviewed documented beta blocker date and time   Airway Mallampati: II  TM Distance: >3 FB Neck ROM: full    Dental no notable dental hx. (+) Teeth Intact   Pulmonary neg pulmonary ROS,    Pulmonary exam normal breath sounds clear to auscultation       Cardiovascular Exercise Tolerance: Good hypertension, negative cardio ROS   Rhythm:regular Rate:Normal     Neuro/Psych  Headaches, negative psych ROS   GI/Hepatic negative GI ROS, Neg liver ROS,   Endo/Other  negative endocrine ROS  Renal/GU negative Renal ROS  negative genitourinary   Musculoskeletal   Abdominal   Peds  Hematology negative hematology ROS (+)   Anesthesia Other Findings   Reproductive/Obstetrics negative OB ROS                             Anesthesia Physical Anesthesia Plan  ASA: 2  Anesthesia Plan: General   Post-op Pain Management:    Induction:   PONV Risk Score and Plan: Propofol infusion  Airway Management Planned:   Additional Equipment:   Intra-op Plan:   Post-operative Plan:   Informed Consent: I have reviewed the patients History and Physical, chart, labs and discussed the procedure including the risks, benefits and alternatives for the proposed anesthesia with the patient or authorized representative who has indicated his/her understanding and acceptance.     Dental Advisory Given  Plan Discussed with: CRNA  Anesthesia Plan Comments:         Anesthesia Quick Evaluation

## 2021-05-03 ENCOUNTER — Encounter (HOSPITAL_COMMUNITY): Payer: Self-pay | Admitting: Urology

## 2021-05-03 ENCOUNTER — Telehealth: Payer: Self-pay

## 2021-05-03 DIAGNOSIS — R339 Retention of urine, unspecified: Secondary | ICD-10-CM

## 2021-05-03 MED ORDER — MIRABEGRON ER 25 MG PO TB24: 25.0000 mg | ORAL_TABLET | Freq: Every day | ORAL | 0 refills | Status: DC

## 2021-05-03 NOTE — Telephone Encounter (Signed)
Patient called with bladder spasms today post urolift. Spoke with Dr. Ronne Binning, samples of Myrbetriq 25mg  left at desk for patient.  Patient instructed to take 1 tablet by mouth daily. Patient voiced understanding. Samples left at desk for patient.

## 2021-05-04 ENCOUNTER — Other Ambulatory Visit: Payer: Self-pay

## 2021-05-04 ENCOUNTER — Ambulatory Visit (INDEPENDENT_AMBULATORY_CARE_PROVIDER_SITE_OTHER): Payer: Medicare PPO | Admitting: Urology

## 2021-05-04 ENCOUNTER — Encounter: Payer: Self-pay | Admitting: Urology

## 2021-05-04 VITALS — BP 174/91 | HR 99 | Temp 98.9°F

## 2021-05-04 DIAGNOSIS — R339 Retention of urine, unspecified: Secondary | ICD-10-CM

## 2021-05-04 DIAGNOSIS — N138 Other obstructive and reflux uropathy: Secondary | ICD-10-CM

## 2021-05-04 DIAGNOSIS — N401 Enlarged prostate with lower urinary tract symptoms: Secondary | ICD-10-CM

## 2021-05-04 NOTE — Patient Instructions (Signed)
Benign Prostatic Hyperplasia Benign prostatic hyperplasia (BPH) is an enlarged prostate gland that is caused by the normal aging process and not by cancer. The prostate is a walnut-sized gland that is involved in the production of semen. It is located in front of the rectum and below the bladder. The bladder stores urine and the urethra is the tube that carries the urine out of the body. The prostate may get bigger as a man gets older. An enlarged prostate can press on the urethra. This can make it harder to pass urine. The build-up of urine in the bladder can cause infection. Back pressure and infection may progress to bladder damage and kidney (renal) failure. What are the causes? This condition is part of a normal aging process. However, not all men develop problems from this condition. If the prostate enlarges away from the urethra, urine flow will not be blocked. If it enlarges toward the urethra and compresses it, there will be problems passing urine. What increases the risk? This condition is more likely to develop in men over the age of 50 years. What are the signs or symptoms? Symptoms of this condition include: Getting up often during the night to urinate. Needing to urinate frequently during the day. Difficulty starting urine flow. Decrease in size and strength of your urine stream. Leaking (dribbling) after urinating. Inability to pass urine. This needs immediate treatment. Inability to completely empty your bladder. Pain when you pass urine. This is more common if there is also an infection. Urinary tract infection (UTI). How is this diagnosed? This condition is diagnosed based on your medical history, a physical exam, and your symptoms. Tests will also be done, such as: A post-void bladder scan. This measures any amount of urine that may remain in your bladder after you finish urinating. A digital rectal exam. In a rectal exam, your health care provider checks your prostate by  putting a lubricated, gloved finger into your rectum to feel the back of your prostate gland. This exam detects the size of your gland and any abnormal lumps or growths. An exam of your urine (urinalysis). A prostate specific antigen (PSA) screening. This is a blood test used to screen for prostate cancer. An ultrasound. This test uses sound waves to electronically produce a picture of your prostate gland. Your health care provider may refer you to a specialist in kidney and prostate diseases (urologist). How is this treated? Once symptoms begin, your health care provider will monitor your condition (active surveillance or watchful waiting). Treatment for this condition will depend on the severity of your condition. Treatment may include: Observation and yearly exams. This may be the only treatment needed if your condition and symptoms are mild. Medicines to relieve your symptoms, including: Medicines to shrink the prostate. Medicines to relax the muscle of the prostate. Surgery in severe cases. Surgery may include: Prostatectomy. In this procedure, the prostate tissue is removed completely through an open incision or with a laparoscope or robotics. Transurethral resection of the prostate (TURP). In this procedure, a tool is inserted through the opening at the tip of the penis (urethra). It is used to cut away tissue of the inner core of the prostate. The pieces are removed through the same opening of the penis. This removes the blockage. Transurethral incision (TUIP). In this procedure, small cuts are made in the prostate. This lessens the prostate's pressure on the urethra. Transurethral microwave thermotherapy (TUMT). This procedure uses microwaves to create heat. The heat destroys and removes a   small amount of prostate tissue. Transurethral needle ablation (TUNA). This procedure uses radio frequencies to destroy and remove a small amount of prostate tissue. Interstitial laser coagulation (ILC).  This procedure uses a laser to destroy and remove a small amount of prostate tissue. Transurethral electrovaporization (TUVP). This procedure uses electrodes to destroy and remove a small amount of prostate tissue. Prostatic urethral lift. This procedure inserts an implant to push the lobes of the prostate away from the urethra. Follow these instructions at home: Take over-the-counter and prescription medicines only as told by your health care provider. Monitor your symptoms for any changes. Contact your health care provider with any changes. Avoid drinking large amounts of liquid before going to bed or out in public. Avoid or reduce how much caffeine or alcohol you drink. Give yourself time when you urinate. Keep all follow-up visits as told by your health care provider. This is important. Contact a health care provider if: You have unexplained back pain. Your symptoms do not get better with treatment. You develop side effects from the medicine you are taking. Your urine becomes very dark or has a bad smell. Your lower abdomen becomes distended and you have trouble passing your urine. Get help right away if: You have a fever or chills. You suddenly cannot urinate. You feel lightheaded, or very dizzy, or you faint. There are large amounts of blood or clots in the urine. Your urinary problems become hard to manage. You develop moderate to severe low back or flank pain. The flank is the side of your body between the ribs and the hip. These symptoms may represent a serious problem that is an emergency. Do not wait to see if the symptoms will go away. Get medical help right away. Call your local emergency services (911 in the [place] Do not drive yourself to the hospital. Summary Benign prostatic hyperplasia (BPH) is an enlarged prostate that is caused by the normal aging process and not by cancer. An enlarged prostate can press on the urethra. This can make it hard to pass urine. This  condition is part of a normal aging process and is more likely to develop in men over the age of 50 years. Get help right away if you suddenly cannot urinate. This information is not intended to replace advice given to you by your health care provider. Make sure you discuss any questions you have with your health care provider. Document Revised: 09/29/2020 Document Reviewed: 02/26/2020 [employer] Patient Education  2022 [employer]  

## 2021-05-04 NOTE — Progress Notes (Signed)
Fill and Pull Catheter Removal  Patient is present today for a catheter removal.  Patient was cleaned and prepped in a sterile fashion of sterile water/ saline was instilled into the bladder when the patient felt the urge to urinate. 48ml of water was then drained from the balloon.  A 16FR foley cath was removed from the bladder no complications were noted .  Patient as then given some time to void on their own.  Patient can void  on their own after some time.  Patient tolerated well.  Performed by: Marchelle Folks RN  Follow up/ Additional notes: to see MD

## 2021-05-04 NOTE — Progress Notes (Signed)
05/04/2021 11:18 AM   Brian Gallegos September 29, 1945 [id number]  Referring provider: Kathyrn Lass, MD [facility],  [address] [address]  Followup after Urolift  HPI: Ms Clemmer is a 75yo here for followup after Urolift 2 days ago. Voiding trial passed today. No dysuria or hematuria. He was having bladder spasms with the foley catheter in place. No other complaints.    PMH: Past Medical History:  Diagnosis Date   Arthritis    "all over"   Chronic lower back pain    Early cataracts, bilateral    Family history of adverse reaction to anesthesia    "son had ligament repair of elbow in 2016; woke up nauseated and disoriented"   Hypercholesterolemia    Hypertension    Migraine    "none in years" (10/12/2015)   Restless leg syndrome    Wears glasses     Surgical History: Past Surgical History:  Procedure Laterality Date   ANTERIOR CERVICAL DECOMP/DISCECTOMY FUSION  ~ 2005   ANTERIOR CERVICAL DISCECTOMY     BACK SURGERY     CARDIAC CATHETERIZATION N/A 10/12/2015   Procedure: Left Heart Cath and Coronary Angiography;  Surgeon: Adrian Prows, MD;  Location: [facility];  Service: Cardiovascular;  Laterality: N/A;   CARDIAC CATHETERIZATION  10/12/2015   Procedure: Coronary Stent Intervention;  Surgeon: Adrian Prows, MD;  Location: [facility];  Service: Cardiovascular;;   COLONOSCOPY     CORONARY ANGIOPLASTY  10/12/2015   DES RCA   CYSTOSCOPY WITH INSERTION OF UROLIFT N/A 05/02/2021   Procedure: CYSTOSCOPY WITH INSERTION OF UROLIFT;  Surgeon: Cleon Gustin, MD;  Location: [facility];  Service: Urology;  Laterality: N/A;   EYE SURGERY Left    cataract   FRACTURE SURGERY     nose repaired from accident   GLAUCOMA SURGERY Left 03/04/2015   JOINT REPLACEMENT     KNEE ARTHROSCOPY Bilateral    LUMBAR DISC SURGERY  ~ 1986-02/08/2007 X 6   MAXIMUM ACCESS (MAS)POSTERIOR LUMBAR INTERBODY FUSION (PLIF) 3 LEVEL  02/08/2007   w/rods and screws   SPINAL CORD  STIMULATOR INSERTION N/A 05/08/2014   Procedure: LUMBAR SPINAL CORD STIMULATOR IMPLANT ;  Surgeon: Bonna Gains, MD;  Location: [facility];  Service: Neurosurgery;  Laterality: N/A;   TOE FUSION Right 06/02/2014   "outside part of big toe"   TOTAL KNEE ARTHROPLASTY Left ~ 2011    Home Medications:  Allergies as of 05/04/2021       Reactions   Bisacodyl Other (See Comments)   Codeine Itching   Low amount is tolerated   Finasteride Hives, Rash        Medication List        Accurate as of May 04, 2021 11:18 AM. If you have any questions, ask your nurse or doctor.          amitriptyline 25 MG tablet Commonly known as: ELAVIL Take 25 mg by mouth at bedtime.   aspirin EC 81 MG tablet Take 81 mg by mouth daily.   carvedilol 25 MG tablet Commonly known as: COREG Take 1 tablet (25 mg total) by mouth 2 (two) times daily.   Cholecalciferol 50 MCG (2000 UT) Caps Take 2,000 Units by mouth daily.   diclofenac sodium 1 % Gel Commonly known as: VOLTAREN Apply 4 g topically 4 (four) times daily. What changed:  when to take this reasons to take this   doxazosin 4 MG tablet Commonly known as: CARDURA Take  4 mg by mouth daily.   DULoxetine 20 MG capsule Commonly known as: CYMBALTA TAKE 1 CAPSULE(20 MG) BY MOUTH DAILY   Entresto 97-103 MG Generic drug: sacubitril-valsartan Take 1 tablet by mouth 2 (two) times daily.   furosemide 20 MG tablet Commonly known as: LASIX Take 1 tablet (20 mg total) by mouth daily as needed for edema.   gabapentin 300 MG capsule Commonly known as: NEURONTIN TAKE 1 CAPSULE BY MOUTH THREE TIMES DAILY What changed: when to take this   ketoconazole 2 % cream Commonly known as: NIZORAL Apply 1 application topically 2 (two) times daily as needed (fungus).   lubiprostone 8 MCG capsule Commonly known as: AMITIZA Take 8 mcg by mouth 2 (two) times daily with a meal.   methocarbamol 750 MG tablet Commonly known as: ROBAXIN Take 750  mg by mouth every 6 (six) hours as needed for muscle spasms (after playing gulf).   mirabegron ER 25 MG Tb24 tablet Commonly known as: MYRBETRIQ Take 1 tablet (25 mg total) by mouth daily.   oxymetazoline 0.05 % nasal spray Commonly known as: AFRIN Place 1 spray into both nostrils 2 (two) times daily as needed for congestion.   potassium chloride 10 MEQ tablet Commonly known as: KLOR-CON TAKE 1 TABLET BY MOUTH DAILY   pyridoxine 100 MG tablet Commonly known as: B-6 Take 100 mg by mouth daily.   rOPINIRole 0.5 MG tablet Commonly known as: REQUIP Take 1.5 mg by mouth in the morning and at bedtime. Afternoon and bedtime   rosuvastatin 40 MG tablet Commonly known as: CRESTOR Take 20 mg by mouth at bedtime.   spironolactone 25 MG tablet Commonly known as: ALDACTONE Take 0.5 tablets (12.5 mg total) by mouth daily.   tadalafil 20 MG tablet Commonly known as: CIALIS Take 1 tablet (20 mg total) by mouth daily as needed.   tamsulosin 0.4 MG Caps capsule Commonly known as: FLOMAX Take 1 capsule (0.4 mg total) by mouth in the morning and at bedtime.   thiamine 100 MG tablet Commonly known as: Vitamin B-1 Take 100 mg by mouth daily.   vitamin B-12 1000 MCG tablet Commonly known as: CYANOCOBALAMIN Take 1,000 mcg by mouth daily.   vitamin E 180 MG (400 UNITS) capsule Take 400 Units by mouth daily.        Allergies:  Allergies  Allergen Reactions   Bisacodyl Other (See Comments)   Codeine Itching    Low amount is tolerated   Finasteride Hives and Rash    Family History: Family History  Problem Relation Age of Onset   Aneurysm Mother    Heart attack Father    Hypertension Father    Cancer Other     Social History:  reports that he has never smoked. He has never used smokeless tobacco. He reports that he does not currently use alcohol after a past usage of about 3.0 standard drinks per week. He reports that he does not use drugs.  ROS: All other review of  systems were reviewed and are negative except what is noted above in HPI  Physical Exam: BP (!) 174/91   Pulse 99   Temp 98.9 F (37.2 C)   Constitutional:  Alert and oriented, No acute distress. HEENT: [place] AT, moist mucus membranes.  Trachea midline, no masses. Cardiovascular: No clubbing, cyanosis, or edema. Respiratory: Normal respiratory effort, no increased work of breathing. GI: Abdomen is soft, nontender, nondistended, no abdominal masses GU: No CVA tenderness.  Lymph: No cervical or inguinal lymphadenopathy.  Skin: No rashes, bruises or suspicious lesions. Neurologic: Grossly intact, no focal deficits, moving all 4 extremities. Psychiatric: Normal mood and affect.  Laboratory Data: Lab Results  Component Value Date   WBC 11.1 (H) 06/06/2020   HGB 14.0 06/06/2020   HCT 43.4 06/06/2020   MCV 98.2 06/06/2020   PLT 217 06/06/2020    Lab Results  Component Value Date   CREATININE 0.85 03/29/2021    No results found for: PSA  Lab Results  Component Value Date   TESTOSTERONE 611 06/15/2020    Lab Results  Component Value Date   HGBA1C  04/20/2010    5.6 (NOTE)                                                                       According to the ADA Clinical Practice Recommendations for 2011, when HbA1c is used as a screening test:   >=6.5%   Diagnostic of Diabetes Mellitus           (if abnormal result  is confirmed)  5.7-6.4%   Increased risk of developing Diabetes Mellitus  References:Diagnosis and Classification of Diabetes Mellitus,Diabetes [id number] 1):S62-S69 and Standards of Medical Care in         Diabetes - 2011,Diabetes Care,2011,34  (Suppl 1):S11-S61.    Urinalysis    Component Value Date/Time   COLORURINE YELLOW 06/06/2020 1403   APPEARANCEUR Clear 04/19/2021 1524   LABSPEC 1.009 06/06/2020 1403   PHURINE 7.0 06/06/2020 1403   GLUCOSEU Negative 04/19/2021 1524   HGBUR MODERATE (A) 06/06/2020 1403   BILIRUBINUR Negative 04/19/2021 1524    KETONESUR NEGATIVE 06/06/2020 1403   PROTEINUR Negative 04/19/2021 1524   PROTEINUR 30 (A) 06/06/2020 1403   UROBILINOGEN 0.2 04/21/2010 1203   NITRITE Negative 04/19/2021 1524   NITRITE NEGATIVE 06/06/2020 1403   LEUKOCYTESUR Negative 04/19/2021 1524   LEUKOCYTESUR SMALL (A) 06/06/2020 1403    Lab Results  Component Value Date   LABMICR Comment 04/19/2021   WBCUA 11-30 (A) 01/28/2021   LABEPIT 0-10 01/28/2021   MUCUS Present 01/28/2021   BACTERIA Many (A) 01/28/2021    Pertinent Imaging:  No results found for this or any previous visit.  No results found for this or any previous visit.  No results found for this or any previous visit.  No results found for this or any previous visit.  No results found for this or any previous visit.  No results found for this or any previous visit.  No results found for this or any previous visit.  No results found for this or any previous visit.   Assessment & Plan:    1. Benign prostatic hyperplasia with urinary obstruction -RCT 1 weeks PVR and if his PVR is normal I will see him back in 6-8 weeks with a PVR.    No follow-ups on file.  Nicolette Bang, MD  [facility] [place]

## 2021-05-04 NOTE — Progress Notes (Signed)
Urological Symptom Review  Patient is experiencing the following symptoms: Burning/pain with urination   Review of Systems  Gastrointestinal (upper)  : Negative for upper GI symptoms  Gastrointestinal (lower) : Negative for lower GI symptoms  Constitutional : Negative for symptoms  Skin: Negative for skin symptoms  Eyes: Negative for eye symptoms  Ear/Nose/Throat : Negative for Ear/Nose/Throat symptoms  Hematologic/Lymphatic: Negative for Hematologic/Lymphatic symptoms  Cardiovascular : Negative for cardiovascular symptoms  Respiratory : Negative for respiratory symptoms  Endocrine: Negative for endocrine symptoms  Musculoskeletal: Back pain  Neurological: Negative for neurological symptoms  Psychologic: Negative for psychiatric symptoms  

## 2021-05-12 ENCOUNTER — Ambulatory Visit: Payer: Medicare PPO | Admitting: Cardiology

## 2021-05-19 ENCOUNTER — Other Ambulatory Visit: Payer: Self-pay

## 2021-05-19 ENCOUNTER — Ambulatory Visit (INDEPENDENT_AMBULATORY_CARE_PROVIDER_SITE_OTHER): Payer: Medicare PPO

## 2021-05-19 DIAGNOSIS — R339 Retention of urine, unspecified: Secondary | ICD-10-CM

## 2021-05-19 NOTE — Progress Notes (Signed)
post void residual=80 

## 2021-05-23 ENCOUNTER — Other Ambulatory Visit: Payer: Self-pay

## 2021-05-23 ENCOUNTER — Ambulatory Visit: Payer: Medicare PPO | Admitting: Cardiology

## 2021-05-23 ENCOUNTER — Encounter: Payer: Self-pay | Admitting: Cardiology

## 2021-05-23 VITALS — BP 124/83 | HR 96 | Temp 98.6°F | Resp 17 | Ht 69.0 in | Wt 201.4 lb

## 2021-05-23 DIAGNOSIS — I2510 Atherosclerotic heart disease of native coronary artery without angina pectoris: Secondary | ICD-10-CM

## 2021-05-23 DIAGNOSIS — Z955 Presence of coronary angioplasty implant and graft: Secondary | ICD-10-CM

## 2021-05-23 DIAGNOSIS — I255 Ischemic cardiomyopathy: Secondary | ICD-10-CM

## 2021-05-23 DIAGNOSIS — E7800 Pure hypercholesterolemia, unspecified: Secondary | ICD-10-CM

## 2021-05-23 DIAGNOSIS — I10 Essential (primary) hypertension: Secondary | ICD-10-CM

## 2021-05-23 DIAGNOSIS — I119 Hypertensive heart disease without heart failure: Secondary | ICD-10-CM

## 2021-05-23 MED ORDER — HYDRALAZINE HCL 50 MG PO TABS
50.0000 mg | ORAL_TABLET | Freq: Three times a day (TID) | ORAL | 2 refills | Status: DC
Start: 2021-05-23 — End: 2021-08-26

## 2021-05-23 NOTE — Progress Notes (Signed)
Brian Gallegos Date of Birth: 02-15-1946 MRN: [id number] Primary Care Provider:Miller, Lattie Haw, MD Former Cardiology Providers: Jeri Lager, APRN, FNP-C Primary Cardiologist: Rex Kras, DO, [facility] (established care 12/30/2019)  Date: 05/23/21 Last Office Visit: 04/14/2021  Chief Complaint  Patient presents with   Follow-up    4 WEEKS   Hypertension    HPI  Brian Gallegos is a 75 y.o.  male who presents to the office with a chief complaint of " 4-week follow-up for blood pressure management and cardiomyopathy." Patient's past medical history and cardiovascular risk factors include: Established coronary artery disease without angina pectoris, status post stenting in the RCA in April 2017, hypertension, hyperlipidemia, prediabetes, advanced age.  Given his underlying coronary artery disease with prior PCI to the RCA he has been managed for underlying CAD and mildly reduced LVEF suggestive of ischemic cardiomyopathy.  He has undergone an echocardiogram in July 2021 which noted mildly reduced LVEF with global hypokinesis and subsequent nuclear stress test noted normal myocardial perfusion with reduced LVEF per gated SPECT.  The shared decision was to uptitrate GDMT.  He has been successfully weaned off of clonidine and doxazosin reduced from 8 mg p.o. daily to 4 mg p.o. daily.  And GDMT has been uptitrated.  Patient's blood pressure at home are not well controlled as compared to office blood pressure readings.  At the last office visit patient was given a new blood pressure cuff as he is enrolled into principal care management without any significant change in outpatient blood pressure readings.  The blood pressure log with patient brings in at today's encounter notes SBP between 164-187 mmHg and DBP around 92-103 mmHg with a pulse between 68-90 bpm.   ALLERGIES: Allergies  Allergen Reactions   Bisacodyl Other (See Comments)   Codeine Itching    Low amount is tolerated    Finasteride Hives and Rash   MEDICATION LIST PRIOR TO VISIT: Current Outpatient Medications on File Prior to Visit  Medication Sig Dispense Refill   amitriptyline (ELAVIL) 25 MG tablet Take 25 mg by mouth at bedtime.      aspirin EC 81 MG tablet Take 81 mg by mouth daily.     carvedilol (COREG) 25 MG tablet Take 1 tablet (25 mg total) by mouth 2 (two) times daily. 60 tablet 0   Cholecalciferol 50 MCG (2000 UT) CAPS Take 2,000 Units by mouth daily.      diclofenac sodium (VOLTAREN) 1 % GEL Apply 4 g topically 4 (four) times daily. (Patient taking differently: Apply 4 g topically 4 (four) times daily as needed (pain).) 5 Tube 1   doxazosin (CARDURA) 4 MG tablet Take 4 mg by mouth daily.     DULoxetine (CYMBALTA) 20 MG capsule TAKE 1 CAPSULE(20 MG) BY MOUTH DAILY 30 capsule 3   furosemide (LASIX) 20 MG tablet Take 1 tablet (20 mg total) by mouth daily as needed for edema. 30 tablet 0   gabapentin (NEURONTIN) 300 MG capsule TAKE 1 CAPSULE BY MOUTH THREE TIMES DAILY (Patient taking differently: Take 300 mg by mouth at bedtime.) 270 capsule 0   ketoconazole (NIZORAL) 2 % cream Apply 1 application topically 2 (two) times daily as needed (fungus).     lubiprostone (AMITIZA) 8 MCG capsule Take 8 mcg by mouth 2 (two) times daily with a meal.     methocarbamol (ROBAXIN) 750 MG tablet Take 750 mg by mouth every 6 (six) hours as needed for muscle spasms (after playing gulf).     mirabegron ER (  MYRBETRIQ) 25 MG TB24 tablet Take 1 tablet (25 mg total) by mouth daily. 30 tablet 0   oxymetazoline (AFRIN) 0.05 % nasal spray Place 1 spray into both nostrils 2 (two) times daily as needed for congestion.     polyethylene glycol powder (GLYCOLAX/MIRALAX) 17 GM/SCOOP powder as needed.     potassium chloride (KLOR-CON) 10 MEQ tablet TAKE 1 TABLET BY MOUTH DAILY 30 tablet 6   pyridoxine (B-6) 100 MG tablet Take 100 mg by mouth daily.     rOPINIRole (REQUIP) 0.5 MG tablet Take 1.5 mg by mouth in the morning and at  bedtime. Afternoon and bedtime     rosuvastatin (CRESTOR) 40 MG tablet Take 20 mg by mouth at bedtime.     sacubitril-valsartan (ENTRESTO) 97-103 MG Take 1 tablet by mouth 2 (two) times daily. 60 tablet 3   spironolactone (ALDACTONE) 25 MG tablet Take 0.5 tablets (12.5 mg total) by mouth daily. 45 tablet 2   tadalafil (CIALIS) 20 MG tablet Take 1 tablet (20 mg total) by mouth daily as needed. 10 tablet 5   tamsulosin (FLOMAX) 0.4 MG CAPS capsule Take 1 capsule (0.4 mg total) by mouth in the morning and at bedtime. 60 capsule 11   thiamine (VITAMIN B-1) 100 MG tablet Take 100 mg by mouth daily.     vitamin B-12 (CYANOCOBALAMIN) 1000 MCG tablet Take 1,000 mcg by mouth daily.      vitamin E 180 MG (400 UNITS) capsule Take 400 Units by mouth daily.     No current facility-administered medications on file prior to visit.    PAST MEDICAL HISTORY: Past Medical History:  Diagnosis Date   Arthritis    "all over"   Chronic lower back pain    Early cataracts, bilateral    Family history of adverse reaction to anesthesia    "son had ligament repair of elbow in 2016; woke up nauseated and disoriented"   Hypercholesterolemia    Hypertension    Migraine    "none in years" (10/12/2015)   Restless leg syndrome    Wears glasses     PAST SURGICAL HISTORY: Past Surgical History:  Procedure Laterality Date   ANTERIOR CERVICAL DECOMP/DISCECTOMY FUSION  ~ 2005   ANTERIOR CERVICAL DISCECTOMY     BACK SURGERY     CARDIAC CATHETERIZATION N/A 10/12/2015   Procedure: Left Heart Cath and Coronary Angiography;  Surgeon: Adrian Prows, MD;  Location: [facility];  Service: Cardiovascular;  Laterality: N/A;   CARDIAC CATHETERIZATION  10/12/2015   Procedure: Coronary Stent Intervention;  Surgeon: Adrian Prows, MD;  Location: [facility];  Service: Cardiovascular;;   COLONOSCOPY     CORONARY ANGIOPLASTY  10/12/2015   DES RCA   CYSTOSCOPY WITH INSERTION OF UROLIFT N/A 05/02/2021   Procedure:  CYSTOSCOPY WITH INSERTION OF UROLIFT;  Surgeon: Cleon Gustin, MD;  Location: [facility];  Service: Urology;  Laterality: N/A;   EYE SURGERY Left    cataract   FRACTURE SURGERY     nose repaired from accident   GLAUCOMA SURGERY Left 03/04/2015   JOINT REPLACEMENT     KNEE ARTHROSCOPY Bilateral    LUMBAR DISC SURGERY  ~ 1986-02/08/2007 X 6   MAXIMUM ACCESS (MAS)POSTERIOR LUMBAR INTERBODY FUSION (PLIF) 3 LEVEL  02/08/2007   w/rods and screws   SPINAL CORD STIMULATOR INSERTION N/A 05/08/2014   Procedure: LUMBAR SPINAL CORD STIMULATOR IMPLANT ;  Surgeon: Bonna Gains, MD;  Location: [facility];  Service: Neurosurgery;  Laterality: N/A;  TOE FUSION Right 06/02/2014   "outside part of big toe"   TOTAL KNEE ARTHROPLASTY Left ~ 2011    FAMILY HISTORY: The patient's family history includes Aneurysm in his mother; Cancer in an other family member; Heart attack in his father; Hypertension in his father.   SOCIAL HISTORY:  The patient  reports that he has never smoked. He has never used smokeless tobacco. He reports that he does not currently use alcohol after a past usage of about 3.0 standard drinks per week. He reports that he does not use drugs.  Review of Systems  Constitutional: Negative for chills and fever.  HENT:  Negative for hoarse voice and nosebleeds.   Eyes:  Negative for discharge, double vision and pain.  Cardiovascular:  Negative for chest pain, claudication, dyspnea on exertion, leg swelling, near-syncope, orthopnea, palpitations, paroxysmal nocturnal dyspnea and syncope.  Respiratory:  Negative for hemoptysis and shortness of breath.   Musculoskeletal:  Negative for muscle cramps and myalgias.  Gastrointestinal:  Negative for abdominal pain, constipation, diarrhea, hematemesis, hematochezia, melena, nausea and vomiting.  Neurological:  Negative for dizziness and light-headedness.   PHYSICAL EXAM: Vitals with BMI 05/23/2021 05/04/2021 05/02/2021  Height 5\' 9"  - -   Weight 201 lbs 6 oz - -  BMI 0000000 - -  Systolic [id number]  Diastolic 83 91 88  Pulse 96 99 87    CONSTITUTIONAL: Well-developed and well-nourished. No acute distress.  SKIN: Skin is warm and dry. No rash noted. No cyanosis. No pallor. No jaundice HEAD: Normocephalic and atraumatic.  EYES: No scleral icterus MOUTH/THROAT: Moist oral membranes.  NECK: No JVD present. No thyromegaly noted. LYMPHATIC: No visible cervical adenopathy.  CHEST Normal respiratory effort. No intercostal retractions  LUNGS: Clear to auscultation bilaterally.  No stridor. No wheezes. No rales.  CARDIOVASCULAR: Regular rate and rhythm, positive S1-S2, no murmurs rubs or gallops appreciated. ABDOMINAL: Soft, nontender, nondistended, positive bowel sounds in all 4 quadrants.  No apparent ascites.  EXTREMITIES: No peripheral edema, warm to touch bilaterally.  With 2+ dorsalis pedis and posterior tibial pulses. HEMATOLOGIC: No significant bruising NEUROLOGIC: Oriented to person, place, and time. Nonfocal. Normal muscle tone.  PSYCHIATRIC: Normal mood and affect. Normal behavior. Cooperative  CARDIAC DATABASE: EKG: 05/27/2019: Sinus bradycardia at 58 bpm, normal axis, PRWP cannot exclude anterior infarct old. No evidence of ischemia.  12/30/2019: Sinus  Bradycardia, 54bpm, without underlying injury pattern.  Echocardiogram: 01/19/2020: LVEF 45-50%, mild global hypokinesis, moderate LVH, grade 1 diastolic impairment, normal LAP, moderately dilated left atrium, mild AR, moderate MR.  Stress Testing:  Lexiscan/modified Bruce Tetrofosmin stress test 01/19/2020:  Lexiscan/modified Bruce nuclear stress test performed using 1-day protocol.  Stress EKG is non-diagnostic, as this is pharmacological stress test. 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  Normal myocardial perfusion. Stress LVEF calculated 44%, although visually appears normal.   Intermediate risk study due to reduced LVEF. Recommend clinical correlation.   Heart Catheterization: Coronary angiogram 10/12/2015: Normal LV systolic function. Moderate disease in the mid circumflex coronary artery of 50%. Diffuse coronary calcification involving the proximal LAD. Right coronary artery very large and dominant. Proximal segment has ulcerated 90% stenosis. Mild amount of calcification is also evident. Stenting proximal large dominant RCA for an ulcerated 90% stenosis to 0% with implantation of a 4.0 x 18 mm resolute DES.  Vascular imaging: Abdominal aortic duplex 05/30/2017: Focal plaque noted in the proximal, mid and distal aorta. The maximum aorta diameter is  2.82 cm (dist). No AAA. Normal iliac velocity.  LABORATORY DATA: CBC Latest Ref Rng & Units 06/06/2020 04/29/2014 04/22/2010  WBC 4.0 - 10.5 K/uL 11.1(H) 6.5 13.3(H)  Hemoglobin 13.0 - 17.0 g/dL 14.0 13.0 9.7(L)  Hematocrit 39.0 - 52.0 % 43.4 38.4(L) 29.5(L)  Platelets 150 - 400 K/uL 217 210 146(L)    CMP Latest Ref Rng & Units 03/29/2021 03/14/2021 02/10/2021  Glucose 70 - 99 mg/dL 98 86 95  BUN 8 - 27 mg/dL 12 20 13   Creatinine 0.76 - 1.27 mg/dL 0.85 1.16 0.77  Sodium 134 - 144 mmol/L 141 143 141  Potassium 3.5 - 5.2 mmol/L 4.2 4.3 4.7  Chloride 96 - 106 mmol/L 105 104 102  CO2 20 - 29 mmol/L 26 26 25   Calcium 8.6 - 10.2 mg/dL 9.3 8.8 9.6  Total Protein 6.5 - 8.1 g/dL - - -  Total Bilirubin 0.3 - 1.2 mg/dL - - -  Alkaline Phos 38 - 126 U/L - - -  AST 15 - 41 U/L - - -  ALT 0 - 44 U/L - - -    Lipid Panel  No results found for: CHOL, TRIG, HDL, CHOLHDL, VLDL, LDLCALC, LDLDIRECT, LABVLDL  Lab Results  Component Value Date   HGBA1C  04/20/2010    5.6 (NOTE)                                                                       According to the ADA Clinical Practice Recommendations for 2011, when HbA1c is used as a screening test:   >=6.5%   Diagnostic of Diabetes Mellitus           (if abnormal result  is  confirmed)  5.7-6.4%   Increased risk of developing Diabetes Mellitus  References:Diagnosis and Classification of Diabetes Mellitus,Diabetes [id number] 1):S62-S69 and Standards of Medical Care in         Diabetes - 2011,Diabetes Care,2011,34  (Suppl 1):S11-S61.   No components found for: NTPROBNP No results found for: TSH  Cardiac Panel (last 3 results) No results for input(s): CKTOTAL, CKMB, TROPONINIHS, RELINDX in the last 72 hours.  IMPRESSION:    ICD-10-CM   1. Hypertension with heart disease  I11.9 hydrALAZINE (APRESOLINE) 50 MG tablet    2. Ischemic cardiomyopathy  I25.5 hydrALAZINE (APRESOLINE) 50 MG tablet    3. Atherosclerosis of native coronary artery of native heart without angina pectoris  I25.10     4. History of coronary angioplasty with insertion of stent  Z95.5     5. Hypercholesterolemia  E78.00     6. Essential hypertension  I10        RECOMMENDATIONS: Brian Gallegos is a 75 y.o. male whose past medical history and cardiovascular risk factors include: Established coronary artery disease without angina pectoris, status post stenting in the RCA in April 2017, hypertension, hyperlipidemia, prediabetes, advanced age.  Ischemic cardiomyopathy: Last LVEF 45%. Overall euvolemic and not in congestive heart failure. Medications reconciled. Will add hydralazine 50 mg p.o. 3 times daily.  Holding off initiating BiDil as he takes Cialis for ED. Patient is considering Trimix as an alternative. Uses Lasix on a as needed basis. May consider Farxiga at the next office visit.  Benign essential hypertension: Office blood pressures  are very well controlled. Ambulatory blood pressure readings are not well controlled as discussed above. Patient's medications were titrated given his mildly reduced LVEF and hypotensive episodes.  He is successfully been weaned off of clonidine and doxazosin has been reduced to 4 mg p.o. daily. Patient continues to have high blood  pressures at home compared to office readings. Blood pressure log reviewed currently enrolled into principal care management. Added hydralazine as discussed above.  Established coronary artery disease without angina pectoris and prior PCI: Chest pain-free. Last echocardiogram noted mildly reduced LVEF at 45-50%, mild global hypokinesis, grade 1 diastolic impairment.  Without any significant valvular heart disease. Myocardial perfusion imaging from July 2021 reported to be intermediate study due to reduced LVEF; however, no reversible myocardial ischemia. Focus on secondary prevention by improving his modifiable cardiovascular risk factors. Medications reconciled.  Hypercholesterolemia: Continue statin therapy.   Follow lipids. Currently managed by primary care provider. Patient denies myalgia or other side effects.  FINAL MEDICATION LIST END OF ENCOUNTER: Meds ordered this encounter  Medications   hydrALAZINE (APRESOLINE) 50 MG tablet    Sig: Take 1 tablet (50 mg total) by mouth 3 (three) times daily.    Dispense:  90 tablet    Refill:  2     There are no discontinued medications.    Current Outpatient Medications:    amitriptyline (ELAVIL) 25 MG tablet, Take 25 mg by mouth at bedtime. , Disp: , Rfl:    aspirin EC 81 MG tablet, Take 81 mg by mouth daily., Disp: , Rfl:    carvedilol (COREG) 25 MG tablet, Take 1 tablet (25 mg total) by mouth 2 (two) times daily., Disp: 60 tablet, Rfl: 0   Cholecalciferol 50 MCG (2000 UT) CAPS, Take 2,000 Units by mouth daily. , Disp: , Rfl:    diclofenac sodium (VOLTAREN) 1 % GEL, Apply 4 g topically 4 (four) times daily. (Patient taking differently: Apply 4 g topically 4 (four) times daily as needed (pain).), Disp: 5 Tube, Rfl: 1   doxazosin (CARDURA) 4 MG tablet, Take 4 mg by mouth daily., Disp: , Rfl:    DULoxetine (CYMBALTA) 20 MG capsule, TAKE 1 CAPSULE(20 MG) BY MOUTH DAILY, Disp: 30 capsule, Rfl: 3   furosemide (LASIX) 20 MG tablet, Take 1  tablet (20 mg total) by mouth daily as needed for edema., Disp: 30 tablet, Rfl: 0   gabapentin (NEURONTIN) 300 MG capsule, TAKE 1 CAPSULE BY MOUTH THREE TIMES DAILY (Patient taking differently: Take 300 mg by mouth at bedtime.), Disp: 270 capsule, Rfl: 0   hydrALAZINE (APRESOLINE) 50 MG tablet, Take 1 tablet (50 mg total) by mouth 3 (three) times daily., Disp: 90 tablet, Rfl: 2   ketoconazole (NIZORAL) 2 % cream, Apply 1 application topically 2 (two) times daily as needed (fungus)., Disp: , Rfl:    lubiprostone (AMITIZA) 8 MCG capsule, Take 8 mcg by mouth 2 (two) times daily with a meal., Disp: , Rfl:    methocarbamol (ROBAXIN) 750 MG tablet, Take 750 mg by mouth every 6 (six) hours as needed for muscle spasms (after playing gulf)., Disp: , Rfl:    mirabegron ER (MYRBETRIQ) 25 MG TB24 tablet, Take 1 tablet (25 mg total) by mouth daily., Disp: 30 tablet, Rfl: 0   oxymetazoline (AFRIN) 0.05 % nasal spray, Place 1 spray into both nostrils 2 (two) times daily as needed for congestion., Disp: , Rfl:    polyethylene glycol powder (GLYCOLAX/MIRALAX) 17 GM/SCOOP powder, as needed., Disp: , Rfl:    potassium  chloride (KLOR-CON) 10 MEQ tablet, TAKE 1 TABLET BY MOUTH DAILY, Disp: 30 tablet, Rfl: 6   pyridoxine (B-6) 100 MG tablet, Take 100 mg by mouth daily., Disp: , Rfl:    rOPINIRole (REQUIP) 0.5 MG tablet, Take 1.5 mg by mouth in the morning and at bedtime. Afternoon and bedtime, Disp: , Rfl:    rosuvastatin (CRESTOR) 40 MG tablet, Take 20 mg by mouth at bedtime., Disp: , Rfl:    sacubitril-valsartan (ENTRESTO) 97-103 MG, Take 1 tablet by mouth 2 (two) times daily., Disp: 60 tablet, Rfl: 3   spironolactone (ALDACTONE) 25 MG tablet, Take 0.5 tablets (12.5 mg total) by mouth daily., Disp: 45 tablet, Rfl: 2   tadalafil (CIALIS) 20 MG tablet, Take 1 tablet (20 mg total) by mouth daily as needed., Disp: 10 tablet, Rfl: 5   tamsulosin (FLOMAX) 0.4 MG CAPS capsule, Take 1 capsule (0.4 mg total) by mouth in the  morning and at bedtime., Disp: 60 capsule, Rfl: 11   thiamine (VITAMIN B-1) 100 MG tablet, Take 100 mg by mouth daily., Disp: , Rfl:    vitamin B-12 (CYANOCOBALAMIN) 1000 MCG tablet, Take 1,000 mcg by mouth daily. , Disp: , Rfl:    vitamin E 180 MG (400 UNITS) capsule, Take 400 Units by mouth daily., Disp: , Rfl:   No orders of the defined types were placed in this encounter.  --Continue cardiac medications as reconciled in final medication list. --Return in about 2 months (around 07/23/2021) for Follow up, BP. Or sooner if needed. --Continue follow-up with your primary care physician regarding the management of your other chronic comorbid conditions.  Patient's questions and concerns were addressed to his satisfaction. He voices understanding of the instructions provided during this encounter.   This note was created using a voice recognition software as a result there may be grammatical errors inadvertently enclosed that do not reflect the nature of this encounter. Every attempt is made to correct such errors.   Tessa Lerner, [place], [facility]  Pager: [phone] Office: [phone]

## 2021-06-03 ENCOUNTER — Other Ambulatory Visit: Payer: Self-pay | Admitting: Cardiology

## 2021-06-03 DIAGNOSIS — I119 Hypertensive heart disease without heart failure: Secondary | ICD-10-CM

## 2021-06-10 ENCOUNTER — Other Ambulatory Visit: Payer: Self-pay | Admitting: Specialist

## 2021-06-14 ENCOUNTER — Telehealth: Payer: Self-pay

## 2021-06-14 ENCOUNTER — Other Ambulatory Visit: Payer: Self-pay

## 2021-06-14 ENCOUNTER — Ambulatory Visit: Payer: Medicare PPO | Admitting: Cardiology

## 2021-06-14 DIAGNOSIS — N5201 Erectile dysfunction due to arterial insufficiency: Secondary | ICD-10-CM

## 2021-06-14 MED ORDER — AMBULATORY NON FORMULARY MEDICATION: 5 refills | Status: DC

## 2021-06-14 NOTE — Telephone Encounter (Signed)
-----   Message from Malen Gauze, MD sent at 06/13/2021  8:56 AM EST ----- yes ----- Message ----- From: Gustavus Messing, LPN Sent: [phone]  10:32 AM EST To: Malen Gauze, MD  Patient would like to discuss Trimix injection. He has a f/u with you on 12/28. Can patient come with medication to learn injection then? If so I will send in medication. Please advise.

## 2021-06-14 NOTE — Telephone Encounter (Signed)
Rx sent. Patient called and made aware. 

## 2021-06-19 ENCOUNTER — Other Ambulatory Visit: Payer: Self-pay | Admitting: Specialist

## 2021-06-20 ENCOUNTER — Other Ambulatory Visit: Payer: Self-pay | Admitting: Cardiology

## 2021-06-20 DIAGNOSIS — I119 Hypertensive heart disease without heart failure: Secondary | ICD-10-CM

## 2021-06-29 ENCOUNTER — Ambulatory Visit: Payer: Medicare PPO | Admitting: Urology

## 2021-07-08 ENCOUNTER — Other Ambulatory Visit: Payer: Self-pay | Admitting: Cardiology

## 2021-07-08 DIAGNOSIS — I119 Hypertensive heart disease without heart failure: Secondary | ICD-10-CM

## 2021-07-08 DIAGNOSIS — I255 Ischemic cardiomyopathy: Secondary | ICD-10-CM

## 2021-07-18 ENCOUNTER — Ambulatory Visit: Payer: Medicare PPO | Admitting: Cardiology

## 2021-07-31 ENCOUNTER — Other Ambulatory Visit: Payer: Self-pay | Admitting: Cardiology

## 2021-08-01 ENCOUNTER — Ambulatory Visit: Payer: Medicare PPO | Admitting: Cardiology

## 2021-08-03 ENCOUNTER — Other Ambulatory Visit: Payer: Self-pay

## 2021-08-03 ENCOUNTER — Ambulatory Visit: Payer: Medicare PPO | Admitting: Urology

## 2021-08-03 VITALS — BP 99/59 | HR 103

## 2021-08-03 DIAGNOSIS — N5201 Erectile dysfunction due to arterial insufficiency: Secondary | ICD-10-CM | POA: Diagnosis not present

## 2021-08-03 DIAGNOSIS — N401 Enlarged prostate with lower urinary tract symptoms: Secondary | ICD-10-CM | POA: Diagnosis not present

## 2021-08-03 DIAGNOSIS — R339 Retention of urine, unspecified: Secondary | ICD-10-CM

## 2021-08-03 DIAGNOSIS — N138 Other obstructive and reflux uropathy: Secondary | ICD-10-CM | POA: Diagnosis not present

## 2021-08-03 LAB — BLADDER SCAN AMB NON-IMAGING: Scan Result: 81

## 2021-08-03 NOTE — Progress Notes (Signed)
08/03/2021 2:17 PM   Brian Gallegos [birth year] [id number]  Referring provider: Kathyrn Lass, MD [place],  Brian Gallegos [id number]  Followup BPH   HPI: [name] is a 76yo here for followup for BPH with urinary retention. PVR 81cc. IPSS 7 QOL 1 after urolift. Urine stream strong. Nocturia 2x. No straining to urinate. No urinary hesitancy. He filled his trimix rx but has not tried the medication. No other complaints today   PMH: Past Medical History:  Diagnosis Date   Arthritis    "all over"   Chronic lower back pain    Early cataracts, bilateral    Family history of adverse reaction to anesthesia    "son had ligament repair of elbow in 2016; woke up nauseated and disoriented"   Hypercholesterolemia    Hypertension    Migraine    "none in years" (10/12/2015)   Restless leg syndrome    Wears glasses     Surgical History: Past Surgical History:  Procedure Laterality Date   ANTERIOR CERVICAL DECOMP/DISCECTOMY FUSION  ~ 2005   ANTERIOR CERVICAL DISCECTOMY     BACK SURGERY     CARDIAC CATHETERIZATION N/A 10/12/2015   Procedure: Left Heart Cath and Coronary Angiography;  Surgeon: Brian Prows, MD;  Location: [facility];  Service: Cardiovascular;  Laterality: N/A;   CARDIAC CATHETERIZATION  10/12/2015   Procedure: Coronary Stent Intervention;  Surgeon: Brian Prows, MD;  Location: [facility];  Service: Cardiovascular;;   COLONOSCOPY     CORONARY ANGIOPLASTY  10/12/2015   DES RCA   CYSTOSCOPY WITH INSERTION OF UROLIFT N/A 05/02/2021   Procedure: CYSTOSCOPY WITH INSERTION OF UROLIFT;  Surgeon: Brian Gustin, MD;  Location: [facility];  Service: Urology;  Laterality: N/A;   EYE SURGERY Left    cataract   FRACTURE SURGERY     nose repaired from accident   GLAUCOMA SURGERY Left 03/04/2015   JOINT REPLACEMENT     KNEE ARTHROSCOPY Bilateral    LUMBAR DISC SURGERY  ~ 1986-02/08/2007 X 6   MAXIMUM ACCESS (MAS)POSTERIOR LUMBAR INTERBODY FUSION (PLIF) 3  LEVEL  02/08/2007   w/rods and screws   SPINAL CORD STIMULATOR INSERTION N/A 05/08/2014   Procedure: LUMBAR SPINAL CORD STIMULATOR IMPLANT ;  Surgeon: Brian Gains, MD;  Location: [facility];  Service: Neurosurgery;  Laterality: N/A;   TOE FUSION Right 06/02/2014   "outside part of big toe"   TOTAL KNEE ARTHROPLASTY Left ~ 2011    Home Medications:  Allergies as of 08/03/2021       Reactions   Bisacodyl Other (See Comments)   Codeine Itching   Low amount is tolerated   Finasteride Hives, Rash        Medication List        Accurate as of August 03, 2021  2:17 PM. If you have any questions, ask your nurse or doctor.          AMBULATORY NON FORMULARY MEDICATION Medication Name:  PGE 30 mcg Pap 30 mg Phent 1 mg 0.2 mls by intracavernosal route as needed   amitriptyline 25 MG tablet Commonly known as: ELAVIL Take 25 mg by mouth at bedtime.   aspirin EC 81 MG tablet Take 81 mg by mouth daily.   carvedilol 25 MG tablet Commonly known as: COREG Take 1 tablet (25 mg total) by mouth 2 (two) times daily.   Cholecalciferol 50 MCG (2000 UT) Caps Take 2,000 Units by mouth daily.   diclofenac  sodium 1 % Gel Commonly known as: VOLTAREN Apply 4 g topically 4 (four) times daily. What changed:  when to take this reasons to take this   doxazosin 4 MG tablet Commonly known as: CARDURA Take 4 mg by mouth daily.   DULoxetine 20 MG capsule Commonly known as: CYMBALTA TAKE 1 CAPSULE(20 MG) BY MOUTH DAILY   Entresto 97-103 MG Generic drug: sacubitril-valsartan TAKE 1 TABLET BY MOUTH TWICE DAILY   furosemide 20 MG tablet Commonly known as: LASIX Take 1 tablet (20 mg total) by mouth daily as needed for edema.   gabapentin 300 MG capsule Commonly known as: NEURONTIN TAKE 1 CAPSULE BY MOUTH THREE TIMES DAILY What changed: when to take this   hydrALAZINE 50 MG tablet Commonly known as: APRESOLINE Take 1 tablet (50 mg total) by mouth 3 (three) times daily.    ketoconazole 2 % cream Commonly known as: NIZORAL Apply 1 application topically 2 (two) times daily as needed (fungus).   lubiprostone 8 MCG capsule Commonly known as: AMITIZA Take 8 mcg by mouth 2 (two) times daily with a meal.   methocarbamol 750 MG tablet Commonly known as: ROBAXIN Take 750 mg by mouth every 6 (six) hours as needed for muscle spasms (after playing [place]).   mirabegron ER 25 MG Tb24 tablet Commonly known as: MYRBETRIQ Take 1 tablet (25 mg total) by mouth daily.   oxymetazoline 0.05 % nasal spray Commonly known as: AFRIN Place 1 spray into both nostrils 2 (two) times daily as needed for congestion.   polyethylene glycol powder 17 GM/SCOOP powder Commonly known as: GLYCOLAX/MIRALAX as needed.   potassium chloride 10 MEQ tablet Commonly known as: KLOR-CON TAKE 1 TABLET BY MOUTH DAILY   pyridoxine 100 MG tablet Commonly known as: B-6 Take 100 mg by mouth daily.   rOPINIRole 0.5 MG tablet Commonly known as: REQUIP Take 1.5 mg by mouth in the morning and at bedtime. Afternoon and bedtime   rosuvastatin 40 MG tablet Commonly known as: CRESTOR Take 20 mg by mouth at bedtime.   spironolactone 25 MG tablet Commonly known as: ALDACTONE Take 0.5 tablets (12.5 mg total) by mouth daily.   tadalafil 20 MG tablet Commonly known as: CIALIS Take 1 tablet (20 mg total) by mouth daily as needed.   tamsulosin 0.4 MG Caps capsule Commonly known as: FLOMAX Take 1 capsule (0.4 mg total) by mouth in the morning and at bedtime.   thiamine 100 MG tablet Commonly known as: Vitamin B-1 Take 100 mg by mouth daily.   vitamin B-12 1000 MCG tablet Commonly known as: CYANOCOBALAMIN Take 1,000 mcg by mouth daily.   vitamin E 180 MG (400 UNITS) capsule Take 400 Units by mouth daily.        Allergies:  Allergies  Allergen Reactions   Bisacodyl Other (See Comments)   Codeine Itching    Low amount is tolerated   Finasteride Hives and Rash    Family  History: Family History  Problem Relation Age of Onset   Aneurysm Mother    Heart attack Father    Hypertension Father    Cancer Other     Social History:  reports that he has never smoked. He has never used smokeless tobacco. He reports that he does not currently use alcohol after a past usage of about 3.0 standard drinks per week. He reports that he does not use drugs.  ROS: All other review of systems were reviewed and are negative except what is noted above in HPI  Physical Exam: BP (!) 99/59    Pulse (!) 103   Constitutional:  Alert and oriented, No acute distress. HEENT: [place] AT, moist mucus membranes.  Trachea midline, no masses. Cardiovascular: No clubbing, cyanosis, or edema. Respiratory: Normal respiratory effort, no increased work of breathing. GI: Abdomen is soft, nontender, nondistended, no abdominal masses GU: No CVA tenderness.  Lymph: No cervical or inguinal lymphadenopathy. Skin: No rashes, bruises or suspicious lesions. Neurologic: Grossly intact, no focal deficits, moving all 4 extremities. Psychiatric: Normal mood and affect.  Laboratory Data: Lab Results  Component Value Date   WBC 11.1 (H) 06/06/2020   HGB 14.0 06/06/2020   HCT 43.4 06/06/2020   MCV 98.2 06/06/2020   PLT 217 06/06/2020    Lab Results  Component Value Date   CREATININE 0.85 03/29/2021    No results found for: PSA  Lab Results  Component Value Date   TESTOSTERONE 611 06/15/2020    Lab Results  Component Value Date   HGBA1C  04/20/2010    5.6 (NOTE)                                                                       According to the ADA Clinical Practice Recommendations for 2011, when HbA1c is used as a screening test:   >=6.5%   Diagnostic of Diabetes Mellitus           (if abnormal result  is confirmed)  5.7-6.4%   Increased risk of developing Diabetes Mellitus  References:Diagnosis and Classification of Diabetes Mellitus,Diabetes [id number] 1):S62-S69 and Standards  of Medical Care in         Diabetes - 2011,Diabetes Care,2011,34  (Suppl 1):S11-S61.    Urinalysis    Component Value Date/Time   COLORURINE YELLOW 06/06/2020 1403   APPEARANCEUR Clear 04/19/2021 1524   LABSPEC 1.009 06/06/2020 1403   PHURINE 7.0 06/06/2020 1403   GLUCOSEU Negative 04/19/2021 1524   HGBUR MODERATE (A) 06/06/2020 1403   BILIRUBINUR Negative 04/19/2021 1524   KETONESUR NEGATIVE 06/06/2020 1403   PROTEINUR Negative 04/19/2021 1524   PROTEINUR 30 (A) 06/06/2020 1403   UROBILINOGEN 0.2 04/21/2010 1203   NITRITE Negative 04/19/2021 1524   NITRITE NEGATIVE 06/06/2020 1403   LEUKOCYTESUR Negative 04/19/2021 1524   LEUKOCYTESUR SMALL (A) 06/06/2020 1403    Lab Results  Component Value Date   LABMICR Comment 04/19/2021   WBCUA 11-30 (A) 01/28/2021   LABEPIT 0-10 01/28/2021   MUCUS Present 01/28/2021   BACTERIA Many (A) 01/28/2021    Pertinent Imaging:  No results found for this or any previous visit.  No results found for this or any previous visit.  No results found for this or any previous visit.  No results found for this or any previous visit.  No results found for this or any previous visit.  No results found for this or any previous visit.  No results found for this or any previous visit.  No results found for this or any previous visit.   Assessment & Plan:    1. Urinary retention -resolved. RTC 6 months with PVR - BLADDER SCAN AMB NON-IMAGING  2. Erectile dysfunction due to arterial insufficiency -Trimix prn  3. Benign prostatic hyperplasia with urinary obstruction -RTC 6 months with PVR  No follow-ups on file.  Nicolette Bang, MD  [facility] [place]

## 2021-08-03 NOTE — Progress Notes (Signed)
post void residual=81  Urological Symptom Review  Patient is experiencing the following symptoms: Get up at night to urinate Erection problems (male only)   Review of Systems  Gastrointestinal (upper)  : Negative for upper GI symptoms  Gastrointestinal (lower) : Negative for lower GI symptoms  Constitutional : Negative for symptoms  Skin: Negative for skin symptoms  Eyes: Negative for eye symptoms  Ear/Nose/Throat : Negative for Ear/Nose/Throat symptoms  Hematologic/Lymphatic: Negative for Hematologic/Lymphatic symptoms  Cardiovascular : Negative for cardiovascular symptoms  Respiratory : Negative for respiratory symptoms  Endocrine: Negative for endocrine symptoms  Musculoskeletal: Back pain Joint pain  Neurological: Negative for neurological symptoms  Psychologic: Negative for psychiatric symptoms

## 2021-08-11 ENCOUNTER — Encounter: Payer: Self-pay | Admitting: Urology

## 2021-08-11 NOTE — Patient Instructions (Signed)

## 2021-08-24 ENCOUNTER — Encounter: Payer: Self-pay | Admitting: Specialist

## 2021-08-24 ENCOUNTER — Ambulatory Visit: Payer: Medicare PPO | Admitting: Specialist

## 2021-08-24 ENCOUNTER — Other Ambulatory Visit: Payer: Self-pay

## 2021-08-24 ENCOUNTER — Ambulatory Visit (INDEPENDENT_AMBULATORY_CARE_PROVIDER_SITE_OTHER): Payer: Medicare PPO

## 2021-08-24 VITALS — BP 101/64 | HR 117 | Ht 69.0 in | Wt 202.0 lb

## 2021-08-24 DIAGNOSIS — G8929 Other chronic pain: Secondary | ICD-10-CM

## 2021-08-24 DIAGNOSIS — M25561 Pain in right knee: Secondary | ICD-10-CM | POA: Diagnosis not present

## 2021-08-24 DIAGNOSIS — M5137 Other intervertebral disc degeneration, lumbosacral region: Secondary | ICD-10-CM

## 2021-08-24 DIAGNOSIS — M1711 Unilateral primary osteoarthritis, right knee: Secondary | ICD-10-CM

## 2021-08-24 NOTE — Patient Instructions (Addendum)
Plan:  Avoid prolong standing and walking. Exercise with the knee as tolerated,stationary bike or pool walking . Use ice to the right knee  30 min on and 15 min off as needed. Take tylenol for antiinflamatory affect. Surgery recommended is a right total knee replacement, and you have been scheduled to see Dr. Magnus Ivan next week for an assessment.  Risks of surgery include risk of infection 1 in 300. Risk of bleeding  Less than 1 5 chance of needing a blood transfusion.

## 2021-08-24 NOTE — Progress Notes (Signed)
Office Visit Note   Patient: Brian Gallegos           Date of Birth: [birth year]           MRN: [id number] Visit Date: 08/24/2021              Requested by: Sigmund Hazel, MD [address] [place],  [place] [address] PCP: Sigmund Hazel, MD   Assessment & Plan: Visit Diagnoses:  1. Chronic pain of right knee   2. Unilateral primary osteoarthritis, right knee   3. Disc disease, degenerative, lumbar or lumbosacral     Plan:  Avoid prolong standing and walking. Exercise with the knee as tolerated,stationary bike or pool walking . Use ice to the right knee  30 min on and 15 min off as needed. Take tylenol for antiinflamatory affect. Surgery recommended is a right total knee replacement, and you have been scheduled to see Dr. Magnus Ivan next week for an assessment.  Risks of surgery include risk of infection 1 in 300. Risk of bleeding  Less than 1 5 chance of needing a blood transfusion.   Follow-Up Instructions: Return in about 1 week (around 08/31/2021), or scheduled an appt to see Dr. Magnus Ivan on 2/28 at 8:15 AM for consideration of TKR..   Orders:  Orders Placed This Encounter  Procedures   XR Knee 1-2 Views Right   No orders of the defined types were placed in this encounter.     Procedures: No procedures performed   Clinical Data: No additional findings.   Subjective: Chief Complaint  Patient presents with   Right Knee - Pain    Wants to discuss replacement    76  year old male with history of 3 level lumbar fusion L2 to L5. He has had left TKR in 04/2010. And this has done well. No back complaints. He is playing golf twice a week but is having difficulty with walking and standing in one playing. Starting pain in the right knee, with pain with first standing in the AM. No bowel difficulty. Has had prostate difficulty in the past with a bladder tack being done, had an emergency surgery for urinary retention but this is much better now. No night pain. Pain is severe  with weight bearing on the right leg. He had considered a total knee replacement several years ago but was found to require cardiac stent and then he was the primary care take for his wife with severe COPD, she passed away about 2 years ago.   Review of Systems  Constitutional: Negative.   HENT: Negative.    Eyes: Negative.   Respiratory: Negative.    Cardiovascular: Negative.   Gastrointestinal: Negative.   Endocrine: Negative.   Genitourinary: Negative.   Musculoskeletal: Negative.   Skin: Negative.   Allergic/Immunologic: Negative.   Neurological: Negative.   Hematological: Negative.   Psychiatric/Behavioral: Negative.      Objective: Vital Signs: BP 101/64 (BP Location: Left Arm, Patient Position: Sitting)    Pulse (!) 117    Ht 5\' 9"  (1.753 m)    Wt 202 lb (91.6 kg)    BMI 29.83 kg/m   Physical Exam Constitutional:      Appearance: He is well-developed.  HENT:     Head: Normocephalic and atraumatic.  Eyes:     Pupils: Pupils are equal, round, and reactive to light.  Pulmonary:     Effort: Pulmonary effort is normal.     Breath sounds: Normal breath sounds.  Abdominal:  General: Bowel sounds are normal.     Palpations: Abdomen is soft.  Musculoskeletal:     Cervical back: Normal range of motion and neck supple.  Skin:    General: Skin is warm and dry.  Neurological:     Mental Status: He is alert and oriented to person, place, and time.  Psychiatric:        Behavior: Behavior normal.        Thought Content: Thought content normal.        Judgment: Judgment normal.    Right Knee Exam   Tenderness  The patient is experiencing tenderness in the medial retinaculum and medial joint line.  Range of Motion  Extension:  -10 abnormal  Flexion:  120   Tests  Varus: positive  Lachman:  Anterior - negative    Posterior - negative Drawer:  Anterior - negative    Posterior - negative Pivot shift: 1+ Patellar apprehension: 2+  Other  Erythema: absent Scars:  absent Sensation: normal Swelling: mild     Specialty Comments:  No specialty comments available.  Imaging: XR Knee 1-2 Views Right  Result Date: 08/24/2021 [facility] and lateral radiographs right knee show DJD that is severe with 7 degrees of varus deformity and severe medial joint line narrowing and subchondral sclerosi of the medial tibial plateau. There is osteophyte forming off the medial lip to the tibia joint line and retropatella erosion centrally with both superior pole and inferior pole osteophytes. Severe medial joint knee osteoarthritis.    PMFS History: Patient Active Problem List   Diagnosis Date Noted   Erectile dysfunction due to arterial insufficiency 07/30/2020   Benign prostatic hyperplasia with urinary obstruction 06/14/2020   Urinary retention 06/14/2020   Post PTCA 10/12/2015   Abnormal cardiovascular stress test 10/11/2015   Atypical chest pain 10/11/2015   Past Medical History:  Diagnosis Date   Arthritis    "all over"   Chronic lower back pain    Early cataracts, bilateral    Family history of adverse reaction to anesthesia    "son had ligament repair of elbow in 2016; woke up nauseated and disoriented"   Hypercholesterolemia    Hypertension    Migraine    "none in years" (10/12/2015)   Restless leg syndrome    Wears glasses     Family History  Problem Relation Age of Onset   Aneurysm Mother    Heart attack Father    Hypertension Father    Cancer Other     Past Surgical History:  Procedure Laterality Date   ANTERIOR CERVICAL DECOMP/DISCECTOMY FUSION  ~ 2005   ANTERIOR CERVICAL DISCECTOMY     BACK SURGERY     CARDIAC CATHETERIZATION N/A 10/12/2015   Procedure: Left Heart Cath and Coronary Angiography;  Surgeon: Yates Decamp, MD;  Location: [facility];  Service: Cardiovascular;  Laterality: N/A;   CARDIAC CATHETERIZATION  10/12/2015   Procedure: Coronary Stent Intervention;  Surgeon: Yates Decamp, MD;  Location: [facility];  Service:  Cardiovascular;;   COLONOSCOPY     CORONARY ANGIOPLASTY  10/12/2015   DES RCA   CYSTOSCOPY WITH INSERTION OF UROLIFT N/A 05/02/2021   Procedure: CYSTOSCOPY WITH INSERTION OF UROLIFT;  Surgeon: Malen Gauze, MD;  Location: [facility];  Service: Urology;  Laterality: N/A;   EYE SURGERY Left    cataract   FRACTURE SURGERY     nose repaired from accident   GLAUCOMA SURGERY Left 03/04/2015   JOINT REPLACEMENT  KNEE ARTHROSCOPY Bilateral    LUMBAR DISC SURGERY  ~ 1986-02/08/2007 X 6   MAXIMUM ACCESS (MAS)POSTERIOR LUMBAR INTERBODY FUSION (PLIF) 3 LEVEL  02/08/2007   w/rods and screws   SPINAL CORD STIMULATOR INSERTION N/A 05/08/2014   Procedure: LUMBAR SPINAL CORD STIMULATOR IMPLANT ;  Surgeon: Gwynne Edinger, MD;  [facility]: [facility];  Service: Neurosurgery;  Laterality: N/A;   TOE FUSION Right 06/02/2014   "outside part of big toe"   TOTAL KNEE ARTHROPLASTY Left ~ 2011   Social History   Occupational History   Not on file  Tobacco Use   Smoking status: Never   Smokeless tobacco: Never  Vaping Use   Vaping Use: Never used  Substance and Sexual Activity   Alcohol use: Not Currently    Alcohol/week: 3.0 standard drinks    Types: 3 Glasses of wine per week    Comment: few times week   Drug use: No   Sexual activity: Not Currently

## 2021-08-25 ENCOUNTER — Other Ambulatory Visit: Payer: Self-pay | Admitting: Cardiology

## 2021-08-25 DIAGNOSIS — I255 Ischemic cardiomyopathy: Secondary | ICD-10-CM

## 2021-08-25 DIAGNOSIS — I119 Hypertensive heart disease without heart failure: Secondary | ICD-10-CM

## 2021-08-30 ENCOUNTER — Encounter: Payer: Self-pay | Admitting: Orthopaedic Surgery

## 2021-08-30 ENCOUNTER — Ambulatory Visit (INDEPENDENT_AMBULATORY_CARE_PROVIDER_SITE_OTHER): Payer: Medicare PPO | Admitting: Orthopaedic Surgery

## 2021-08-30 DIAGNOSIS — M1711 Unilateral primary osteoarthritis, right knee: Secondary | ICD-10-CM | POA: Insufficient documentation

## 2021-08-30 DIAGNOSIS — G8929 Other chronic pain: Secondary | ICD-10-CM | POA: Diagnosis not present

## 2021-08-30 DIAGNOSIS — M25561 Pain in right knee: Secondary | ICD-10-CM | POA: Diagnosis not present

## 2021-08-30 MED ORDER — LIDOCAINE HCL 1 % IJ SOLN
3.0000 mL | INTRAMUSCULAR | Status: AC | PRN
  Administered 2021-08-30: 3 mL

## 2021-08-30 MED ORDER — METHYLPREDNISOLONE ACETATE 40 MG/ML IJ SUSP
40.0000 mg | INTRAMUSCULAR | Status: AC | PRN
  Administered 2021-08-30: 40 mg via INTRA_ARTICULAR

## 2021-08-30 NOTE — Progress Notes (Signed)
Office Visit Note   Patient: Brian Gallegos           Date of Birth: 11/28/1945           MRN: [id number] Visit Date: 08/30/2021              Requested by: Kathyrn Lass, [place],  [place] [address] PCP: Kathyrn Lass, MD   Assessment & Plan: Visit Diagnoses:  1. Chronic pain of right knee   2. Unilateral primary osteoarthritis, right knee     Plan:   I did speak to the patient in length in detail about the situation with his right knee.  I have recommended knee replacement and showed him a knee replacement model.  We did place a steroid injection per his request in his right knee today since he wants to try to hold off in October.  With that being said, we can see him back in 6 months to see how is doing overall and at that point work on scheduling the knee replacement.  If things worsen before then he knows he can call and come in and be seen earlier.  Follow-Up Instructions: Return in about 6 months (around 02/27/2022).   Orders:  Orders Placed This Encounter  Procedures   Large Joint Inj   No orders of the defined types were placed in this encounter.     Procedures: Large Joint Inj: R knee on 08/30/2021 8:18 AM Indications: diagnostic evaluation and pain Details: 22 G 1.5 in needle, superolateral approach  Arthrogram: No  Medications: 3 mL lidocaine 1 %; 40 mg methylPREDNISolone acetate 40 MG/ML Outcome: tolerated well, no immediate complications Procedure, treatment alternatives, risks and benefits explained, specific risks discussed. Consent was given by the patient. Immediately prior to procedure a time out was called to verify the correct patient, procedure, equipment, support staff and site/side marked as required. Patient was prepped and draped in the usual sterile fashion.      Clinical Data: No additional findings.   Subjective: Chief Complaint  Patient presents with   Right Knee - Pain  The patient comes in today for evaluation  treatment of known severe tricompartment arthritis of his right knee.  He is a patient of Dr. Louanne Skye who sent MRI to consider knee replacement surgery on the patient.  He had a successful left total knee arthroplasty 12 years ago.  He is interested in having his right knee replaced given the pain he is having in that knee on a daily basis.  His x-rays show end-stage arthritis.  At this point his knee pain is definitely affecting his mobility, his quality of life and his actives daily living.  He is not a diabetic and not on blood thinning medication.  I did review his medications and comorbidities in epic.  He says right now he is playing a lot of golf.  He said his golf game is doing well so he wants to try to put off knee replacement until October of this year.  He has not had an injection of a steroid in his right knee in a long time.  HPI  Review of Systems Today he denies any headache, chest pain, shortness of breath, fever, chills, nausea, vomiting  Objective: Vital Signs: There were no vitals taken for this visit.  Physical Exam He is alert and orient x3 and in no acute distress Ortho Exam Examination of his right knee does show varus malalignment that is correctable.  His significant  medial joint line tenderness and patellofemoral crepitation.  His knee is ligamentously stable and has full range of motion.  There is no significant effusion. Specialty Comments:  No specialty comments available.  Imaging: No results found. Recent x-rays on the canopy system show tricompartment arthritis of the right knee with varus malalignment.  There is bone-on-bone wear of the medial compartment and significant narrowing of the patellofemoral joints.  There are osteophytes in all 3 compartments.  PMFS History: Patient Active Problem List   Diagnosis Date Noted   Unilateral primary osteoarthritis, right knee 08/30/2021   Erectile dysfunction due to arterial insufficiency 07/30/2020   Benign prostatic  hyperplasia with urinary obstruction 06/14/2020   Urinary retention 06/14/2020   Post PTCA 10/12/2015   Abnormal cardiovascular stress test 10/11/2015   Atypical chest pain 10/11/2015   Past Medical History:  Diagnosis Date   Arthritis    "all over"   Chronic lower back pain    Early cataracts, bilateral    Family history of adverse reaction to anesthesia    "son had ligament repair of elbow in 2016; woke up nauseated and disoriented"   Hypercholesterolemia    Hypertension    Migraine    "none in years" (10/12/2015)   Restless leg syndrome    Wears glasses     Family History  Problem Relation Age of Onset   Aneurysm Mother    Heart attack Father    Hypertension Father    Cancer Other     Past Surgical History:  Procedure Laterality Date   ANTERIOR CERVICAL DECOMP/DISCECTOMY FUSION  ~ 2005   ANTERIOR CERVICAL DISCECTOMY     BACK SURGERY     CARDIAC CATHETERIZATION N/A 10/12/2015   Procedure: Left Heart Cath and Coronary Angiography;  Surgeon: Adrian Prows, MD;  Location: [facility];  Service: Cardiovascular;  Laterality: N/A;   CARDIAC CATHETERIZATION  10/12/2015   Procedure: Coronary Stent Intervention;  Surgeon: Adrian Prows, MD;  Location: [facility];  Service: Cardiovascular;;   COLONOSCOPY     CORONARY ANGIOPLASTY  10/12/2015   DES RCA   CYSTOSCOPY WITH INSERTION OF UROLIFT N/A 05/02/2021   Procedure: CYSTOSCOPY WITH INSERTION OF UROLIFT;  Surgeon: Cleon Gustin, MD;  Location: [facility];  Service: Urology;  Laterality: N/A;   EYE SURGERY Left    cataract   FRACTURE SURGERY     nose repaired from accident   GLAUCOMA SURGERY Left 03/04/2015   JOINT REPLACEMENT     KNEE ARTHROSCOPY Bilateral    LUMBAR DISC SURGERY  ~ 1986-02/08/2007 X 6   MAXIMUM ACCESS (MAS)POSTERIOR LUMBAR INTERBODY FUSION (PLIF) 3 LEVEL  02/08/2007   w/rods and screws   SPINAL CORD STIMULATOR INSERTION N/A 05/08/2014   Procedure: LUMBAR SPINAL CORD STIMULATOR IMPLANT ;  Surgeon:  Bonna Gains, MD;  Location: [facility];  Service: Neurosurgery;  Laterality: N/A;   TOE FUSION Right 06/02/2014   "outside part of big toe"   TOTAL KNEE ARTHROPLASTY Left ~ 2011   Social History   Occupational History   Not on file  Tobacco Use   Smoking status: Never   Smokeless tobacco: Never  Vaping Use   Vaping Use: Never used  Substance and Sexual Activity   Alcohol use: Not Currently    Alcohol/week: 3.0 standard drinks    Types: 3 Glasses of wine per week    Comment: few times week   Drug use: No   Sexual activity: Not Currently

## 2021-10-25 ENCOUNTER — Ambulatory Visit: Payer: Medicare PPO | Admitting: Cardiology

## 2021-10-25 ENCOUNTER — Encounter: Payer: Self-pay | Admitting: Cardiology

## 2021-10-25 VITALS — BP 134/68 | HR 95 | Temp 97.9°F | Resp 16 | Ht 69.0 in | Wt 201.2 lb

## 2021-10-25 DIAGNOSIS — I10 Essential (primary) hypertension: Secondary | ICD-10-CM

## 2021-10-25 DIAGNOSIS — I2510 Atherosclerotic heart disease of native coronary artery without angina pectoris: Secondary | ICD-10-CM

## 2021-10-25 DIAGNOSIS — I255 Ischemic cardiomyopathy: Secondary | ICD-10-CM

## 2021-10-25 DIAGNOSIS — Z955 Presence of coronary angioplasty implant and graft: Secondary | ICD-10-CM

## 2021-10-25 DIAGNOSIS — E7800 Pure hypercholesterolemia, unspecified: Secondary | ICD-10-CM

## 2021-10-25 NOTE — Progress Notes (Signed)
? ?Brian Gallegos ?Date of Birth: 1945/08/26 ?MRN: [id number] ?Primary Care Provider:Miller, Misty Stanley, MD ?Former Cardiology Providers: [place], APRN, FNP-C ?Primary Cardiologist: Tessa Lerner, DO, [facility] (established care 12/30/2019) ? ?Date: 10/25/21 ?Last Office Visit: 05/23/2021 ? ?Chief Complaint  ?Patient presents with  ? Follow-up  ?  98-month follow-up for blood pressure and cardiomyopathy/CAD management  ? ? ?HPI  ?Brian Gallegos is a 76 y.o.  male whose past medical history and cardiovascular risk factors include: Established coronary artery disease without angina pectoris, status post stenting in the RCA in April 2017, hypertension, hyperlipidemia, prediabetes, advanced age. ? ?Patient has known history of CAD status post PCI to the RCA and was being managed medically.  Routine echocardiogram noted mildly reduced LVEF likely due to a degree of ischemic cardiomyopathy.  Subsequently underwent a nuclear stress test which noted normal myocardial perfusion with reduced LVEF by gated SPECT.  Shared decision was to uptitrate GDMT which has been successful over the last several office visits. ? ?Clinically patient continues to play golf twice a week and enjoys traveling.  His blood pressures at home are around 120/75 mmHg per patient.  He is no longer being followed by ambulatory blood pressure monitoring via PCM. ? ?Patient plans to have knee surgery in the near future. ? ?ALLERGIES: ?Allergies  ?Allergen Reactions  ? Bisacodyl Other (See Comments)  ? Codeine Itching  ?  Low amount is tolerated  ? Finasteride Hives and Rash  ? ?MEDICATION LIST PRIOR TO VISIT: ?Current Outpatient Medications on File Prior to Visit  ?Medication Sig Dispense Refill  ? AMBULATORY NON FORMULARY MEDICATION Medication Name:  ?PGE 30 mcg ?Pap 30 mg ?Phent 1 mg ?0.2 mls by intracavernosal route as needed 5 mL 5  ? amitriptyline (ELAVIL) 25 MG tablet Take 25 mg by mouth at bedtime.     ? aspirin EC 81 MG tablet Take 81 mg by mouth  daily.    ? carvedilol (COREG) 25 MG tablet Take 1 tablet (25 mg total) by mouth 2 (two) times daily. 60 tablet 0  ? Cholecalciferol 50 MCG (2000 UT) CAPS Take 2,000 Units by mouth daily.     ? diclofenac sodium (VOLTAREN) 1 % GEL Apply 4 g topically 4 (four) times daily. (Patient taking differently: Apply 4 g topically 4 (four) times daily as needed (pain).) 5 Tube 1  ? doxazosin (CARDURA) 4 MG tablet Take 4 mg by mouth daily.    ? DULoxetine (CYMBALTA) 20 MG capsule TAKE 1 CAPSULE(20 MG) BY MOUTH DAILY 30 capsule 3  ? ENTRESTO 97-103 MG TAKE 1 TABLET BY MOUTH TWICE DAILY 60 tablet 3  ? hydrALAZINE (APRESOLINE) 50 MG tablet TAKE 1 TABLET(50 MG) BY MOUTH THREE TIMES DAILY 90 tablet 2  ? ketoconazole (NIZORAL) 2 % cream Apply 1 application topically 2 (two) times daily as needed (fungus).    ? lubiprostone (AMITIZA) 8 MCG capsule Take 8 mcg by mouth 2 (two) times daily with a meal.    ? methocarbamol (ROBAXIN) 750 MG tablet Take 750 mg by mouth every 6 (six) hours as needed for muscle spasms (after playing [place]).    ? mirabegron ER (MYRBETRIQ) 25 MG TB24 tablet Take 1 tablet (25 mg total) by mouth daily. 30 tablet 0  ? potassium chloride (KLOR-CON) 10 MEQ tablet TAKE 1 TABLET BY MOUTH DAILY 30 tablet 6  ? pyridoxine (B-6) 100 MG tablet Take 100 mg by mouth daily.    ? rOPINIRole (REQUIP) 0.5 MG tablet Take 1.5 mg by  mouth in the morning and at bedtime. Afternoon and bedtime    ? rosuvastatin (CRESTOR) 40 MG tablet Take 20 mg by mouth at bedtime.    ? spironolactone (ALDACTONE) 25 MG tablet Take 0.5 tablets (12.5 mg total) by mouth daily. 45 tablet 2  ? tadalafil (CIALIS) 20 MG tablet Take 1 tablet (20 mg total) by mouth daily as needed. 10 tablet 5  ? tamsulosin (FLOMAX) 0.4 MG CAPS capsule Take 1 capsule (0.4 mg total) by mouth in the morning and at bedtime. 60 capsule 11  ? thiamine (VITAMIN B-1) 100 MG tablet Take 100 mg by mouth daily.    ? vitamin B-12 (CYANOCOBALAMIN) 1000 MCG tablet Take 1,000 mcg by mouth  daily.     ? vitamin E 180 MG (400 UNITS) capsule Take 400 Units by mouth daily.    ? oxymetazoline (AFRIN) 0.05 % nasal spray Place 1 spray into both nostrils 2 (two) times daily as needed for congestion.    ? ?No current facility-administered medications on file prior to visit.  ? ? ?PAST MEDICAL HISTORY: ?Past Medical History:  ?Diagnosis Date  ? Arthritis   ? "all over"  ? Chronic lower back pain   ? Early cataracts, bilateral   ? Family history of adverse reaction to anesthesia   ? "son had ligament repair of elbow in 2016; woke up nauseated and disoriented"  ? Hypercholesterolemia   ? Hypertension   ? Migraine   ? "none in years" (10/12/2015)  ? Restless leg syndrome   ? Wears glasses   ? ? ?PAST SURGICAL HISTORY: ?Past Surgical History:  ?Procedure Laterality Date  ? ANTERIOR CERVICAL DECOMP/DISCECTOMY FUSION  ~ 2005  ? ANTERIOR CERVICAL DISCECTOMY    ? BACK SURGERY    ? CARDIAC CATHETERIZATION N/A 10/12/2015  ? Procedure: Left Heart Cath and Coronary Angiography;  Surgeon: Yates Decamp, MD;  Location: [facility] INVASIVE CV LAB;  Service: Cardiovascular;  Laterality: N/A;  ? CARDIAC CATHETERIZATION  10/12/2015  ? Procedure: Coronary Stent Intervention;  Surgeon: Yates Decamp, MD;  Location: [facility] INVASIVE CV LAB;  Service: Cardiovascular;;  ? COLONOSCOPY    ? CORONARY ANGIOPLASTY  10/12/2015  ? DES RCA  ? CYSTOSCOPY WITH INSERTION OF UROLIFT N/A 05/02/2021  ? Procedure: CYSTOSCOPY WITH INSERTION OF UROLIFT;  Surgeon: Malen Gauze, MD;  Location: [facility];  Service: Urology;  Laterality: N/A;  ? EYE SURGERY Left   ? cataract  ? FRACTURE SURGERY    ? nose repaired from accident  ? GLAUCOMA SURGERY Left 03/04/2015  ? JOINT REPLACEMENT    ? KNEE ARTHROSCOPY Bilateral   ? LUMBAR DISC SURGERY  ~ 1986-02/08/2007 X 6  ? MAXIMUM ACCESS (MAS)POSTERIOR LUMBAR INTERBODY FUSION (PLIF) 3 LEVEL  02/08/2007  ? w/rods and screws  ? SPINAL CORD STIMULATOR INSERTION N/A 05/08/2014  ? Procedure: LUMBAR SPINAL CORD STIMULATOR IMPLANT ;   Surgeon: Gwynne Edinger, MD;  Location: [facility];  Service: Neurosurgery;  Laterality: N/A;  ? TOE FUSION Right 06/02/2014  ? "outside part of big toe"  ? TOTAL KNEE ARTHROPLASTY Left ~ 2011  ? ? ?FAMILY HISTORY: ?The patient's family history includes Aneurysm in his mother; Cancer in an other family member; Heart attack in his father; Hypertension in his father. ?  ?SOCIAL HISTORY:  ?The patient  reports that he has never smoked. He has never used smokeless tobacco. He reports that he does not currently use alcohol after a past usage of about 3.0 standard drinks per week. He  reports that he does not use drugs. ? ?Review of Systems  ?Cardiovascular:  Negative for chest pain, dyspnea on exertion, leg swelling, near-syncope, orthopnea, palpitations, paroxysmal nocturnal dyspnea and syncope.  ?Respiratory:  Negative for shortness of breath.   ? ?PHYSICAL EXAM: ? ?  10/25/2021  ?  9:16 AM 08/24/2021  ? 10:02 AM 08/03/2021  ?  2:03 PM  ?Vitals with BMI  ?Height 5\' 9"  5\' 9"    ?Weight 201 lbs 3 oz 202 lbs   ?BMI 29.7 29.82   ?Systolic 134 101 99  ?Diastolic 68 64 59  ?Pulse 95 117 103  ? ? ?CONSTITUTIONAL: Well-developed and well-nourished. No acute distress.  ?SKIN: Skin is warm and dry. No rash noted. No cyanosis. No pallor. No jaundice ?HEAD: Normocephalic and atraumatic.  ?EYES: No scleral icterus ?MOUTH/THROAT: Moist oral membranes.  ?NECK: No JVD present. No thyromegaly noted. ?CHEST Normal respiratory effort. No intercostal retractions  ?LUNGS: Clear to auscultation bilaterally.  No stridor. No wheezes. No rales.  ?CARDIOVASCULAR: Regular rate and rhythm, positive S1-S2, no murmurs rubs or gallops appreciated. ?ABDOMINAL: Soft, nontender, nondistended, positive bowel sounds in all 4 quadrants.  No apparent ascites.  ?EXTREMITIES: No peripheral edema, warm to touch bilaterally.  With 2+ dorsalis pedis and posterior tibial pulses. ?HEMATOLOGIC: No significant bruising ?NEUROLOGIC: Oriented to person, place, and time.  Nonfocal. Normal muscle tone.  ?PSYCHIATRIC: Normal mood and affect. Normal behavior. Cooperative ? ?CARDIAC DATABASE: ?EKG: ?October 25, 2021: Normal sinus rhythm, 86 bpm, occasional PACs, without underlying injur

## 2021-11-05 ENCOUNTER — Other Ambulatory Visit: Payer: Self-pay | Admitting: Cardiology

## 2021-11-05 DIAGNOSIS — I255 Ischemic cardiomyopathy: Secondary | ICD-10-CM

## 2021-11-05 DIAGNOSIS — I119 Hypertensive heart disease without heart failure: Secondary | ICD-10-CM

## 2021-11-08 DIAGNOSIS — D225 Melanocytic nevi of trunk: Secondary | ICD-10-CM | POA: Diagnosis not present

## 2021-11-08 DIAGNOSIS — Z08 Encounter for follow-up examination after completed treatment for malignant neoplasm: Secondary | ICD-10-CM | POA: Diagnosis not present

## 2021-11-08 DIAGNOSIS — X32XXXD Exposure to sunlight, subsequent encounter: Secondary | ICD-10-CM | POA: Diagnosis not present

## 2021-11-08 DIAGNOSIS — L570 Actinic keratosis: Secondary | ICD-10-CM | POA: Diagnosis not present

## 2021-11-08 DIAGNOSIS — Z1283 Encounter for screening for malignant neoplasm of skin: Secondary | ICD-10-CM | POA: Diagnosis not present

## 2021-11-08 DIAGNOSIS — Z85820 Personal history of malignant melanoma of skin: Secondary | ICD-10-CM | POA: Diagnosis not present

## 2021-11-08 DIAGNOSIS — L910 Hypertrophic scar: Secondary | ICD-10-CM | POA: Diagnosis not present

## 2021-11-19 DIAGNOSIS — S61412A Laceration without foreign body of left hand, initial encounter: Secondary | ICD-10-CM | POA: Diagnosis not present

## 2021-11-19 DIAGNOSIS — Z23 Encounter for immunization: Secondary | ICD-10-CM | POA: Diagnosis not present

## 2021-12-02 ENCOUNTER — Other Ambulatory Visit: Payer: Self-pay | Admitting: Specialist

## 2021-12-02 DIAGNOSIS — Z4802 Encounter for removal of sutures: Secondary | ICD-10-CM | POA: Diagnosis not present

## 2021-12-05 ENCOUNTER — Other Ambulatory Visit: Payer: Self-pay | Admitting: Cardiology

## 2021-12-05 DIAGNOSIS — I255 Ischemic cardiomyopathy: Secondary | ICD-10-CM

## 2021-12-05 DIAGNOSIS — I119 Hypertensive heart disease without heart failure: Secondary | ICD-10-CM

## 2021-12-12 ENCOUNTER — Telehealth: Payer: Self-pay

## 2021-12-12 NOTE — Telephone Encounter (Signed)
Pt called and stated that he has been SOB for around 10 days. He has been checking his BP and it has been normal, his pulse has been around 116. His oxygen has been in the low to mid 90's. He stated that it gets worse when he lays down flat and when he is walking. He feels better when he sits up in his recliner. [place] is [place] about 3 [place] and does not want to feel like this when he goes. Pt is taking his medications as directed. Please advise.

## 2021-12-12 NOTE — Telephone Encounter (Signed)
Please have him do labs BMP, NT-BNP, Mg (needs to be ordered and released).  Have him come in after that to be evaluated.  EKG on arrival.  If symptoms are worsening please tell him to go to ER.   Dr. Odis Hollingshead

## 2021-12-13 ENCOUNTER — Other Ambulatory Visit: Payer: Self-pay

## 2021-12-13 DIAGNOSIS — I119 Hypertensive heart disease without heart failure: Secondary | ICD-10-CM

## 2021-12-13 NOTE — Telephone Encounter (Signed)
Called pt, no answer. Left vm requesting call back. Labs have been ordered and released.

## 2021-12-14 NOTE — Telephone Encounter (Signed)
Called pt, no answer. Left vm requesting call back?

## 2021-12-16 NOTE — Telephone Encounter (Signed)
Third attempt to call pt, no answer. Left vm requesting call back. No attempt has been made to contact us at this time.

## 2021-12-19 NOTE — Telephone Encounter (Signed)
Spoke with patient, transferred call to front desk staff [facility]) to schedule appointment. Labs have been ordered and released.

## 2021-12-19 NOTE — Telephone Encounter (Signed)
Pt aware.

## 2021-12-20 DIAGNOSIS — I119 Hypertensive heart disease without heart failure: Secondary | ICD-10-CM | POA: Diagnosis not present

## 2021-12-21 LAB — BASIC METABOLIC PANEL
BUN/Creatinine Ratio: 13 (ref 10–24)
BUN: 10 mg/dL (ref 8–27)
CO2: 24 mmol/L (ref 20–29)
Calcium: 9.2 mg/dL (ref 8.6–10.2)
Chloride: 101 mmol/L (ref 96–106)
Creatinine, Ser: 0.79 mg/dL (ref 0.76–1.27)
Glucose: 109 mg/dL — ABNORMAL HIGH (ref 70–99)
Potassium: 4.5 mmol/L (ref 3.5–5.2)
Sodium: 139 mmol/L (ref 134–144)
eGFR: 93 mL/min/{1.73_m2} (ref >59)

## 2021-12-21 LAB — PRO B NATRIURETIC PEPTIDE: NT-Pro BNP: 2381 pg/mL — ABNORMAL HIGH (ref 0–486)

## 2021-12-21 LAB — MAGNESIUM: Magnesium: 2 mg/dL (ref 1.6–2.3)

## 2021-12-22 ENCOUNTER — Ambulatory Visit: Payer: Medicare PPO | Admitting: Cardiology

## 2021-12-22 ENCOUNTER — Encounter: Payer: Self-pay | Admitting: Cardiology

## 2021-12-22 ENCOUNTER — Other Ambulatory Visit: Payer: Self-pay | Admitting: Cardiology

## 2021-12-22 VITALS — BP 153/93 | HR 106 | Temp 97.3°F | Resp 16 | Ht 69.0 in | Wt 202.0 lb

## 2021-12-22 DIAGNOSIS — I255 Ischemic cardiomyopathy: Secondary | ICD-10-CM

## 2021-12-22 DIAGNOSIS — E7800 Pure hypercholesterolemia, unspecified: Secondary | ICD-10-CM | POA: Diagnosis not present

## 2021-12-22 DIAGNOSIS — I10 Essential (primary) hypertension: Secondary | ICD-10-CM | POA: Diagnosis not present

## 2021-12-22 DIAGNOSIS — I5041 Acute combined systolic (congestive) and diastolic (congestive) heart failure: Secondary | ICD-10-CM

## 2021-12-22 DIAGNOSIS — R0602 Shortness of breath: Secondary | ICD-10-CM | POA: Diagnosis not present

## 2021-12-22 DIAGNOSIS — I2510 Atherosclerotic heart disease of native coronary artery without angina pectoris: Secondary | ICD-10-CM | POA: Diagnosis not present

## 2021-12-22 DIAGNOSIS — Z955 Presence of coronary angioplasty implant and graft: Secondary | ICD-10-CM | POA: Diagnosis not present

## 2021-12-22 MED ORDER — BUMETANIDE 1 MG PO TABS: 1.0000 mg | ORAL_TABLET | Freq: Two times a day (BID) | ORAL | 0 refills | Status: DC

## 2021-12-22 MED ORDER — ISOSORBIDE DINITRATE 20 MG PO TABS: 20.0000 mg | ORAL_TABLET | Freq: Three times a day (TID) | ORAL | 0 refills | Status: DC

## 2021-12-22 MED ORDER — NITROGLYCERIN 0.4 MG SL SUBL: 0.4000 mg | SUBLINGUAL_TABLET | SUBLINGUAL | 0 refills | Status: DC | PRN

## 2021-12-22 NOTE — Progress Notes (Signed)
Brian Gallegos Date of Birth: [birth year] MRN: [id number] Primary Care Provider:Miller, Misty Stanley, MD Former Cardiology Providers: Brian [place], APRN, FNP-C Primary Cardiologist: Brian Lerner, DO, [facility] (established care 12/30/2019)  Date: 12/22/21 Last Office Visit: 10/25/2021  Chief Complaint  Patient presents with   Coronary Artery Disease   Follow-up   Shortness of Breath    HPI  Brian Gallegos is a 76 y.o.  male whose past medical history and cardiovascular risk factors include: Established coronary artery disease without angina pectoris, status post stenting in the RCA in April 2017, hypertension, hyperlipidemia, prediabetes, advanced age.  Patient is accompanied by his daughter Brian Gallegos at today's office visit who also provides collateral history.  He has known history of CAD status post PCI in 2017 and is being managed medically.  He also has mildly reduced LVEF and he has been on GDMT but presents today for sooner office visit due to worsening shortness of breath over the last 3 to 4 weeks.  Does not recall the insult that may have happened 4 to 5 weeks ago prior to his symptom initiation.  He has shortness of breath with effort related activities, orthopnea, paroxysmal nocturnal dyspnea, and minimal lower extremity swelling.  I requested that he have labs prior to today's arrival.  His NT proBNP levels are higher compared to August 2022.  Home blood pressure log log reviewed SBP consistently greater than 140 mmHg.  Patient denies anginal discomfort.  He recently played golf and had sexual intercourse in the last 1 week.  ALLERGIES: Allergies  Allergen Reactions   Bisacodyl Other (See Comments)   Codeine Itching    Low amount is tolerated   Finasteride Hives and Rash   MEDICATION LIST PRIOR TO VISIT: Current Outpatient Medications on File Prior to Visit  Medication Sig Dispense Refill   AMBULATORY NON FORMULARY MEDICATION Medication Name:  PGE 30 mcg Pap 30 mg Phent 1  mg 0.2 mls by intracavernosal route as needed 5 mL 5   amitriptyline (ELAVIL) 25 MG tablet Take 25 mg by mouth at bedtime.      aspirin EC 81 MG tablet Take 81 mg by mouth daily.     carvedilol (COREG) 25 MG tablet Take 1 tablet (25 mg total) by mouth 2 (two) times daily. 60 tablet 0   Cholecalciferol 50 MCG (2000 UT) CAPS Take 2,000 Units by mouth daily.      diclofenac sodium (VOLTAREN) 1 % GEL Apply 4 g topically 4 (four) times daily. (Patient taking differently: Apply 4 g topically 4 (four) times daily as needed (pain).) 5 Tube 1   doxazosin (CARDURA) 4 MG tablet Take 4 mg by mouth daily.     DULoxetine (CYMBALTA) 20 MG capsule TAKE 1 CAPSULE(20 MG) BY MOUTH DAILY 30 capsule 3   ENTRESTO 97-103 MG TAKE 1 TABLET BY MOUTH TWICE DAILY 60 tablet 3   hydrALAZINE (APRESOLINE) 50 MG tablet TAKE 1 TABLET(50 MG) BY MOUTH THREE TIMES DAILY 90 tablet 2   ketoconazole (NIZORAL) 2 % cream Apply 1 application topically 2 (two) times daily as needed (fungus).     lubiprostone (AMITIZA) 8 MCG capsule Take 8 mcg by mouth 2 (two) times daily with a meal.     methocarbamol (ROBAXIN) 750 MG tablet Take 750 mg by mouth every 6 (six) hours as needed for muscle spasms (after playing gulf).     mirabegron ER (MYRBETRIQ) 25 MG TB24 tablet Take 1 tablet (25 mg total) by mouth daily. 30 tablet 0  potassium chloride (KLOR-CON) 10 MEQ tablet TAKE 1 TABLET BY MOUTH DAILY 30 tablet 6   pyridoxine (B-6) 100 MG tablet Take 100 mg by mouth daily.     rOPINIRole (REQUIP) 0.5 MG tablet Take 1.5 mg by mouth in the morning and at bedtime. Afternoon and bedtime     rosuvastatin (CRESTOR) 40 MG tablet Take 20 mg by mouth at bedtime.     spironolactone (ALDACTONE) 25 MG tablet TAKE 1/2 TABLET(12.5 MG) BY MOUTH DAILY 45 tablet 2   tamsulosin (FLOMAX) 0.4 MG CAPS capsule Take 1 capsule (0.4 mg total) by mouth in the morning and at bedtime. 60 capsule 11   thiamine (VITAMIN B-1) 100 MG tablet Take 100 mg by mouth daily.     vitamin  B-12 (CYANOCOBALAMIN) 1000 MCG tablet Take 1,000 mcg by mouth daily.      vitamin E 180 MG (400 UNITS) capsule Take 400 Units by mouth daily.     No current facility-administered medications on file prior to visit.    PAST MEDICAL HISTORY: Past Medical History:  Diagnosis Date   Arthritis    "all over"   Chronic lower back pain    Early cataracts, bilateral    Family history of adverse reaction to anesthesia    "son had ligament repair of elbow in 2016; woke up nauseated and disoriented"   Hypercholesterolemia    Hypertension    Migraine    "none in years" (10/12/2015)   Restless leg syndrome    Wears glasses     PAST SURGICAL HISTORY: Past Surgical History:  Procedure Laterality Date   ANTERIOR CERVICAL DECOMP/DISCECTOMY FUSION  ~ 2005   ANTERIOR CERVICAL DISCECTOMY     BACK SURGERY     CARDIAC CATHETERIZATION N/A 10/12/2015   Procedure: Left Heart Cath and Coronary Angiography;  Surgeon: Adrian Prows, MD;  Location: [facility];  Service: Cardiovascular;  Laterality: N/A;   CARDIAC CATHETERIZATION  10/12/2015   Procedure: Coronary Stent Intervention;  Surgeon: Adrian Prows, MD;  Location: [facility];  Service: Cardiovascular;;   COLONOSCOPY     CORONARY ANGIOPLASTY  10/12/2015   DES RCA   CYSTOSCOPY WITH INSERTION OF UROLIFT N/A 05/02/2021   Procedure: CYSTOSCOPY WITH INSERTION OF UROLIFT;  Surgeon: Cleon Gustin, MD;  Location: [facility];  Service: Urology;  Laterality: N/A;   EYE SURGERY Left    cataract   FRACTURE SURGERY     nose repaired from accident   GLAUCOMA SURGERY Left 03/04/2015   JOINT REPLACEMENT     KNEE ARTHROSCOPY Bilateral    LUMBAR DISC SURGERY  ~ 1986-02/08/2007 X 6   MAXIMUM ACCESS (MAS)POSTERIOR LUMBAR INTERBODY FUSION (PLIF) 3 LEVEL  02/08/2007   w/rods and screws   SPINAL CORD STIMULATOR INSERTION N/A 05/08/2014   Procedure: LUMBAR SPINAL CORD STIMULATOR IMPLANT ;  Surgeon: Bonna Gains, MD;  Location: [facility];  Service:  Neurosurgery;  Laterality: N/A;   TOE FUSION Right 06/02/2014   "outside part of big toe"   TOTAL KNEE ARTHROPLASTY Left ~ 2011    FAMILY HISTORY: The patient's family history includes Aneurysm in his mother; Cancer in an other family member; Heart attack in his father; Hypertension in his father.   SOCIAL HISTORY:  The patient  reports that he has never smoked. He has never used smokeless tobacco. He reports that he does not currently use alcohol after a past usage of about 3.0 standard drinks of alcohol per week. He reports that he does  not use drugs.  Review of Systems  Cardiovascular:  Positive for dyspnea on exertion, orthopnea and paroxysmal nocturnal dyspnea. Negative for chest pain, leg swelling, near-syncope, palpitations and syncope.  Respiratory:  Positive for shortness of breath.     PHYSICAL EXAM:    12/22/2021    3:38 PM 12/22/2021    3:31 PM 10/25/2021    9:16 AM  Vitals with BMI  Height  5\' 9"  5\' 9"   Weight  202 lbs 201 lbs 3 oz  BMI  [id number]  Systolic 0000000 [id number]  Diastolic 93 90 68  Pulse [id number] 106 95    CONSTITUTIONAL: Well-developed and well-nourished. No acute distress.  SKIN: Skin is warm and dry. No rash noted. No cyanosis. No pallor. No jaundice HEAD: Normocephalic and atraumatic.  EYES: No scleral icterus MOUTH/THROAT: Moist oral membranes.  NECK: Mild JVD present. No thyromegaly noted. CHEST Normal respiratory effort. No intercostal retractions  LUNGS: Clear to auscultation bilaterally.  No stridor. No wheezes. No rales.  CARDIOVASCULAR: Tachycardic, regular rate and rhythm, positive S1-S2, no murmurs rubs or gallops appreciated. ABDOMINAL: Soft, nontender, nondistended, positive bowel sounds in all 4 quadrants.  No apparent ascites.  EXTREMITIES: Bilateral lower extremity peripheral edema up to mid shin, warm to touch bilaterally.  With 2+ dorsalis pedis and posterior tibial pulses. HEMATOLOGIC: No significant bruising NEUROLOGIC: Oriented to  person, place, and time. Nonfocal. Normal muscle tone.  PSYCHIATRIC: Normal mood and affect. Normal behavior. Cooperative  CARDIAC DATABASE: EKG: October 25, 2021: Normal sinus rhythm, 86 bpm, occasional PACs, without underlying injury pattern.  Echocardiogram: 01/19/2020: LVEF 45-50%, mild global hypokinesis, moderate LVH, grade 1 diastolic impairment, normal LAP, moderately dilated left atrium, mild AR, moderate MR.  Stress Testing:  Lexiscan/modified Bruce Tetrofosmin stress test 01/19/2020:  Lexiscan/modified Bruce nuclear stress test performed using 1-day protocol.  Stress EKG is non-diagnostic, as this is pharmacological stress test. 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  Normal myocardial perfusion. Stress LVEF calculated 44%, although visually appears normal.  Intermediate risk study due to reduced LVEF. Recommend clinical correlation.   Heart Catheterization: Coronary angiogram 10/12/2015: Normal LV systolic function. Moderate disease in the mid circumflex coronary artery of 50%. Diffuse coronary calcification involving the proximal LAD. Right coronary artery very large and dominant. Proximal segment has ulcerated 90% stenosis. Mild amount of calcification is also evident. Stenting proximal large dominant RCA for an ulcerated 90% stenosis to 0% with implantation of a 4.0 x 18 mm resolute DES.  Vascular imaging: Abdominal aortic duplex 05/30/2017: Focal plaque noted in the proximal, mid and distal aorta. The maximum aorta diameter is 2.82 cm (dist). No AAA. Normal iliac velocity.  LABORATORY DATA:    Latest Ref Rng & Units 06/06/2020    1:02 PM 04/29/2014   11:57 AM 04/22/2010    5:19 AM  CBC  WBC 4.0 - 10.5 K/uL 11.1  6.5  13.3   Hemoglobin 13.0 - 17.0 g/dL 14.0  13.0  9.7   Hematocrit 39.0 - 52.0 % 43.4  38.4  29.5   Platelets 150 - 400 K/uL 217  210  146        Latest Ref Rng & Units 12/20/2021     1:55 PM 03/29/2021   12:08 PM 03/14/2021    2:16 PM  CMP  Glucose 70 - 99 mg/dL 109  98  86   BUN 8 - 27 mg/dL 10  12  20    Creatinine 0.76 -  1.27 mg/dL 3.41  9.37  9.02   Sodium 134 - 144 mmol/L 139  141  143   Potassium 3.5 - 5.2 mmol/L 4.5  4.2  4.3   Chloride 96 - 106 mmol/L 101  105  104   CO2 20 - 29 mmol/L 24  26  26    Calcium 8.6 - 10.2 mg/dL 9.2  9.3  8.8     Lipid Panel  No results found for: "CHOL", "TRIG", "HDL", "CHOLHDL", "VLDL", "LDLCALC", "LDLDIRECT", "LABVLDL"  Lab Results  Component Value Date   HGBA1C  04/20/2010    5.6 (NOTE)                                                                       According to the ADA Clinical Practice Recommendations for 2011, when HbA1c is used as a screening test:   >=6.5%   Diagnostic of Diabetes Mellitus           (if abnormal result  is confirmed)  5.7-6.4%   Increased risk of developing Diabetes Mellitus  References:Diagnosis and Classification of Diabetes Mellitus,Diabetes Care,2011,34(Suppl 1):S62-S69 and Standards of Medical Care in         Diabetes - 2011,Diabetes Care,2011,34  (Suppl 1):S11-S61.   No components found for: "NTPROBNP" No results found for: "TSH"  Cardiac Panel (last 3 results) No results for input(s): "CKTOTAL", "CKMB", "TROPONINIHS", "RELINDX" in the last 72 hours.  IMPRESSION:    ICD-10-CM   1. Acute combined systolic and diastolic heart failure (HCC)  2012 bumetanide (BUMEX) 1 MG tablet    PCV ECHOCARDIOGRAM COMPLETE    Basic metabolic panel    Magnesium    Pro b natriuretic peptide (BNP)    isosorbide dinitrate (ISORDIL) 20 MG tablet    DG Chest 2 View    2. SOB (shortness of breath)  R06.02 EKG 12-Lead    3. Ischemic cardiomyopathy  I25.5     4. Atherosclerosis of native coronary artery of native heart without angina pectoris  I25.10 nitroGLYCERIN (NITROSTAT) 0.4 MG SL tablet    5. History of coronary angioplasty with insertion of stent  Z95.5 nitroGLYCERIN (NITROSTAT) 0.4 MG SL tablet     6. Hypercholesterolemia  E78.00     7. Essential hypertension  I10 isosorbide dinitrate (ISORDIL) 20 MG tablet       RECOMMENDATIONS: Brian Gallegos is a 76 y.o. male whose past medical history and cardiovascular risk factors include: Established coronary artery disease without angina pectoris, status post stenting in the RCA in April 2017, hypertension, hyperlipidemia, prediabetes, advanced age.  Acute combined systolic and diastolic heart failure (HCC) Stage B, NYHA class III Symptoms initiated about 3 to 4 weeks ago.  Recent labs from 12/20/2021 independently reviewed, renal function within acceptable limits NT proBNP elevated. Medications include: Carvedilol, Entresto, hydralazine, spironolactone, rosuvastatin, aspirin Discontinued Cialis 20 mg p.o. as needed for ED.  Start Isordil 20 mg p.o. 3 times daily.  Start Bumex 1 mg p.o. twice daily.  Chest x-ray.  Once euvolemic we will start Farxiga. Echo will be ordered to evaluate for structural heart disease and left ventricular systolic function. He will need repeat ischemic work-up based on the results of the echocardiogram.  In the interim, if he has worsening heart  failure symptoms or new onset of anginal discomfort he is asked to go to the closest ER via EMS.  Ischemic cardiomyopathy Last echocardiogram notes LVEF of 45%. Last MPI notes normal myocardial perfusion without evidence of reversible ischemia. See above  Atherosclerosis of native coronary artery of native heart without angina pectoris, history of PCI to RCA Denies angina pectoris. EKG shows sinus tachycardia without underlying injury pattern. Refill sublingual nitroglycerin tablets.  Medication profile discussed.  Patient is aware not to use sublingual nitroglycerin tablets if ED medications are used.  Instead safer to call EMS if he has chest pain.  Hypercholesterolemia Currently on Crestor, denies myalgias or other side effects.  Essential  hypertension Currently not at goal. Medication changes as noted above. In the past he was successfully weaned off of clonidine and doxazosin reduced to 4 mg p.o. daily  FINAL MEDICATION LIST END OF ENCOUNTER: Meds ordered this encounter  Medications   bumetanide (BUMEX) 1 MG tablet    Sig: Take 1 tablet (1 mg total) by mouth 2 (two) times daily.    Dispense:  60 tablet    Refill:  0   isosorbide dinitrate (ISORDIL) 20 MG tablet    Sig: Take 1 tablet (20 mg total) by mouth 3 (three) times daily.    Dispense:  270 tablet    Refill:  0   nitroGLYCERIN (NITROSTAT) 0.4 MG SL tablet    Sig: Place 1 tablet (0.4 mg total) under the tongue every 5 (five) minutes as needed for chest pain. If you require more than two tablets five minutes apart go to the nearest ER via EMS.    Dispense:  30 tablet    Refill:  0     Medications Discontinued During This Encounter  Medication Reason   tadalafil (CIALIS) 20 MG tablet Discontinued by provider      Current Outpatient Medications:    AMBULATORY NON FORMULARY MEDICATION, Medication Name:  PGE 30 mcg Pap 30 mg Phent 1 mg 0.2 mls by intracavernosal route as needed, Disp: 5 mL, Rfl: 5   amitriptyline (ELAVIL) 25 MG tablet, Take 25 mg by mouth at bedtime. , Disp: , Rfl:    aspirin EC 81 MG tablet, Take 81 mg by mouth daily., Disp: , Rfl:    bumetanide (BUMEX) 1 MG tablet, Take 1 tablet (1 mg total) by mouth 2 (two) times daily., Disp: 60 tablet, Rfl: 0   carvedilol (COREG) 25 MG tablet, Take 1 tablet (25 mg total) by mouth 2 (two) times daily., Disp: 60 tablet, Rfl: 0   Cholecalciferol 50 MCG (2000 UT) CAPS, Take 2,000 Units by mouth daily. , Disp: , Rfl:    diclofenac sodium (VOLTAREN) 1 % GEL, Apply 4 g topically 4 (four) times daily. (Patient taking differently: Apply 4 g topically 4 (four) times daily as needed (pain).), Disp: 5 Tube, Rfl: 1   doxazosin (CARDURA) 4 MG tablet, Take 4 mg by mouth daily., Disp: , Rfl:    DULoxetine (CYMBALTA) 20 MG  capsule, TAKE 1 CAPSULE(20 MG) BY MOUTH DAILY, Disp: 30 capsule, Rfl: 3   ENTRESTO 97-103 MG, TAKE 1 TABLET BY MOUTH TWICE DAILY, Disp: 60 tablet, Rfl: 3   hydrALAZINE (APRESOLINE) 50 MG tablet, TAKE 1 TABLET(50 MG) BY MOUTH THREE TIMES DAILY, Disp: 90 tablet, Rfl: 2   isosorbide dinitrate (ISORDIL) 20 MG tablet, Take 1 tablet (20 mg total) by mouth 3 (three) times daily., Disp: 270 tablet, Rfl: 0   ketoconazole (NIZORAL) 2 % cream, Apply 1 application  topically 2 (two) times daily as needed (fungus)., Disp: , Rfl:    lubiprostone (AMITIZA) 8 MCG capsule, Take 8 mcg by mouth 2 (two) times daily with a meal., Disp: , Rfl:    methocarbamol (ROBAXIN) 750 MG tablet, Take 750 mg by mouth every 6 (six) hours as needed for muscle spasms (after playing gulf)., Disp: , Rfl:    mirabegron ER (MYRBETRIQ) 25 MG TB24 tablet, Take 1 tablet (25 mg total) by mouth daily., Disp: 30 tablet, Rfl: 0   nitroGLYCERIN (NITROSTAT) 0.4 MG SL tablet, Place 1 tablet (0.4 mg total) under the tongue every 5 (five) minutes as needed for chest pain. If you require more than two tablets five minutes apart go to the nearest ER via EMS., Disp: 30 tablet, Rfl: 0   potassium chloride (KLOR-CON) 10 MEQ tablet, TAKE 1 TABLET BY MOUTH DAILY, Disp: 30 tablet, Rfl: 6   pyridoxine (B-6) 100 MG tablet, Take 100 mg by mouth daily., Disp: , Rfl:    rOPINIRole (REQUIP) 0.5 MG tablet, Take 1.5 mg by mouth in the morning and at bedtime. Afternoon and bedtime, Disp: , Rfl:    rosuvastatin (CRESTOR) 40 MG tablet, Take 20 mg by mouth at bedtime., Disp: , Rfl:    spironolactone (ALDACTONE) 25 MG tablet, TAKE 1/2 TABLET(12.5 MG) BY MOUTH DAILY, Disp: 45 tablet, Rfl: 2   tamsulosin (FLOMAX) 0.4 MG CAPS capsule, Take 1 capsule (0.4 mg total) by mouth in the morning and at bedtime., Disp: 60 capsule, Rfl: 11   thiamine (VITAMIN B-1) 100 MG tablet, Take 100 mg by mouth daily., Disp: , Rfl:    vitamin B-12 (CYANOCOBALAMIN) 1000 MCG tablet, Take 1,000 mcg by  mouth daily. , Disp: , Rfl:    vitamin E 180 MG (400 UNITS) capsule, Take 400 Units by mouth daily., Disp: , Rfl:   Orders Placed This Encounter  Procedures   DG Chest 2 View   Basic metabolic panel   Magnesium   Pro b natriuretic peptide (BNP)   EKG 12-Lead   PCV ECHOCARDIOGRAM COMPLETE    --Continue cardiac medications as reconciled in final medication list. --Return in about 2 weeks (around 01/05/2022) for Follow up, heart failure management., Review test results. Or sooner if needed. --Continue follow-up with your primary care physician regarding the management of your other chronic comorbid conditions.  Patient's questions and concerns were addressed to his satisfaction. He voices understanding of the instructions provided during this encounter.   This note was created using a voice recognition software as a result there may be grammatical errors inadvertently enclosed that do not reflect the nature of this encounter. Every attempt is made to correct such errors.   Rex Kras, [place], [facility]  Pager: [phone] Office: [phone]

## 2021-12-25 ENCOUNTER — Encounter: Payer: Self-pay | Admitting: Cardiology

## 2021-12-27 ENCOUNTER — Ambulatory Visit
Admission: RE | Admit: 2021-12-27 | Discharge: 2021-12-27 | Disposition: A | Discharge status: Home / Self Care | Payer: Medicare PPO | Source: Ambulatory Visit | Attending: Cardiology | Admitting: Cardiology

## 2021-12-27 DIAGNOSIS — I5041 Acute combined systolic (congestive) and diastolic (congestive) heart failure: Secondary | ICD-10-CM

## 2021-12-27 DIAGNOSIS — R0602 Shortness of breath: Secondary | ICD-10-CM | POA: Diagnosis not present

## 2021-12-28 ENCOUNTER — Ambulatory Visit (HOSPITAL_COMMUNITY)
Admission: RE | Admit: 2021-12-28 | Discharge: 2021-12-28 | Disposition: A | Discharge status: Home / Self Care | Payer: Medicare PPO | Source: Ambulatory Visit | Attending: Cardiology | Admitting: Cardiology

## 2021-12-28 ENCOUNTER — Encounter: Payer: Self-pay | Admitting: Cardiology

## 2021-12-28 DIAGNOSIS — I5041 Acute combined systolic (congestive) and diastolic (congestive) heart failure: Secondary | ICD-10-CM | POA: Insufficient documentation

## 2021-12-28 NOTE — Telephone Encounter (Signed)
From patient.

## 2021-12-29 ENCOUNTER — Encounter: Payer: Self-pay | Admitting: Cardiology

## 2021-12-29 NOTE — Telephone Encounter (Signed)
From patient.

## 2021-12-30 NOTE — Telephone Encounter (Signed)
Pt requesting echocardiogram results when finished

## 2022-01-01 ENCOUNTER — Encounter: Payer: Self-pay | Admitting: Cardiology

## 2022-01-02 ENCOUNTER — Telehealth: Payer: Self-pay | Admitting: Cardiology

## 2022-01-02 DIAGNOSIS — I119 Hypertensive heart disease without heart failure: Secondary | ICD-10-CM | POA: Diagnosis not present

## 2022-01-02 NOTE — Telephone Encounter (Signed)
Patient is asking if he could have someone call him about some echo results. He goes on vacation Thursday (aware he has an appointment). Please call him back.

## 2022-01-02 NOTE — Telephone Encounter (Signed)
Called pt no answer, left a vm

## 2022-01-02 NOTE — Telephone Encounter (Signed)
Ok to refill Lasix.  

## 2022-01-03 LAB — MAGNESIUM: Magnesium: 2.1 mg/dL (ref 1.6–2.3)

## 2022-01-03 LAB — PRO B NATRIURETIC PEPTIDE: NT-Pro BNP: 1455 pg/mL — ABNORMAL HIGH (ref 0–486)

## 2022-01-04 LAB — ECHOCARDIOGRAM COMPLETE
AR max vel: 2.08 cm2
AV Peak grad: 14.1 mmHg
Ao pk vel: 1.88 m/s
Area-P 1/2: 4.54 cm2
Calc EF: 44.6 %
MV M vel: 4.14 m/s
MV Peak grad: 68.7 mmHg
S' Lateral: 4.8 cm
Single Plane A2C EF: 44 %
Single Plane A4C EF: 42.3 %

## 2022-01-04 NOTE — Telephone Encounter (Signed)
Patient is requesting echocardiogram results. Please advise.

## 2022-01-05 ENCOUNTER — Ambulatory Visit: Payer: Medicare PPO | Admitting: Cardiology

## 2022-01-05 ENCOUNTER — Other Ambulatory Visit: Payer: Self-pay | Admitting: Cardiology

## 2022-01-05 ENCOUNTER — Encounter: Payer: Self-pay | Admitting: Cardiology

## 2022-01-05 VITALS — BP 120/69 | HR 105 | Temp 98.7°F | Resp 16 | Ht 69.0 in | Wt 198.8 lb

## 2022-01-05 DIAGNOSIS — E7800 Pure hypercholesterolemia, unspecified: Secondary | ICD-10-CM | POA: Diagnosis not present

## 2022-01-05 DIAGNOSIS — I5041 Acute combined systolic (congestive) and diastolic (congestive) heart failure: Secondary | ICD-10-CM

## 2022-01-05 DIAGNOSIS — Z955 Presence of coronary angioplasty implant and graft: Secondary | ICD-10-CM | POA: Diagnosis not present

## 2022-01-05 DIAGNOSIS — I2510 Atherosclerotic heart disease of native coronary artery without angina pectoris: Secondary | ICD-10-CM | POA: Diagnosis not present

## 2022-01-05 DIAGNOSIS — I255 Ischemic cardiomyopathy: Secondary | ICD-10-CM

## 2022-01-05 DIAGNOSIS — I10 Essential (primary) hypertension: Secondary | ICD-10-CM | POA: Diagnosis not present

## 2022-01-05 MED ORDER — FUROSEMIDE 40 MG PO TABS: 40.0000 mg | ORAL_TABLET | Freq: Every morning | ORAL | 0 refills | Status: DC

## 2022-01-05 NOTE — Progress Notes (Signed)
Brian Gallegos Date of Birth: [birth year] MRN: [id number] Primary Care Provider:Miller, Lattie Haw, MD Former Cardiology Providers: Jeri Lager, APRN, FNP-C Primary Cardiologist: Rex Kras, DO, [facility] (established care 12/30/2019)  Date: 01/05/22 Last Office Visit: 12/22/2021  Chief Complaint  Patient presents with   Follow-up    2 [place]   Results    HPI  DEION NORQUIST is a 76 y.o.  male whose past medical history and cardiovascular risk factors include: Established coronary artery disease without angina pectoris, status post stenting in the RCA in April 2017, hypertension, hyperlipidemia, prediabetes, advanced age.  History of coronary artery disease status post PCI in 2017 who was managed medically until recently started noticing symptoms of worsening shortness of breath over the last 3 to 4 weeks.  At the last office visit I transitioned him from Lasix to Bumex and also started isosorbide dinitrate.  However he was unable to tolerate Bumex and Isordil as it was causing him to have migraines and headaches.  He has been off of the medication since January 01, 2022.  While he was on Bumex that he did have good urine output and his NT proBNP has improved and symptoms of shortness of breath have also improved.  PND and orthopnea have essentially resolved.  The chest x-ray findings suggest consistent with mild CHF and echocardiogram notes mildly reduced LVEF with regional wall motion abnormalities which is new compared to the past.  He denies anginal discomfort and no use of sublingual nitroglycerin tablets.  Home blood pressure log reviewed systolic blood pressures range between 120-140 mmHg and a few numbers around 90 mmHg as well.  ALLERGIES: Allergies  Allergen Reactions   Bisacodyl Other (See Comments)   Codeine Itching    Low amount is tolerated   Finasteride Hives and Rash   MEDICATION LIST PRIOR TO VISIT: Current Outpatient Medications on File  Prior to Visit  Medication Sig Dispense Refill   amitriptyline (ELAVIL) 25 MG tablet Take 25 mg by mouth at bedtime.      aspirin EC 81 MG tablet Take 81 mg by mouth daily.     carvedilol (COREG) 25 MG tablet Take 1 tablet (25 mg total) by mouth 2 (two) times daily. 60 tablet 0   Cholecalciferol 50 MCG (2000 UT) CAPS Take 2,000 Units by mouth daily.      diclofenac sodium (VOLTAREN) 1 % GEL Apply 4 g topically 4 (four) times daily. (Patient taking differently: Apply 4 g topically 4 (four) times daily as needed (pain).) 5 Tube 1   doxazosin (CARDURA) 4 MG tablet Take 4 mg by mouth daily.     DULoxetine (CYMBALTA) 20 MG capsule TAKE 1 CAPSULE(20 MG) BY MOUTH DAILY 30 capsule 3   ENTRESTO 97-103 MG TAKE 1 TABLET BY MOUTH TWICE DAILY 60 tablet 3   hydrALAZINE (APRESOLINE) 50 MG tablet TAKE 1 TABLET(50 MG) BY MOUTH THREE TIMES DAILY 90 tablet 2   lubiprostone (AMITIZA) 8 MCG capsule Take 8 mcg by mouth 2 (two) times daily with a meal.     methocarbamol (ROBAXIN) 750 MG tablet Take 750 mg by mouth every 6 (six) hours as needed for muscle spasms (after playing gulf).     nitroGLYCERIN (NITROSTAT) 0.4 MG SL tablet Place 1 tablet (0.4 mg total) under the tongue every 5 (five) minutes as needed for chest pain. If you require more than two tablets five minutes apart go to the nearest ER via EMS. 30 tablet 0   potassium  chloride (KLOR-CON) 10 MEQ tablet TAKE 1 TABLET BY MOUTH DAILY 30 tablet 6   pyridoxine (B-6) 100 MG tablet Take 100 mg by mouth daily.     rOPINIRole (REQUIP) 0.5 MG tablet Take 1.5 mg by mouth in the morning and at bedtime. Afternoon and bedtime     rosuvastatin (CRESTOR) 40 MG tablet Take 20 mg by mouth at bedtime.     spironolactone (ALDACTONE) 25 MG tablet TAKE 1/2 TABLET(12.5 MG) BY MOUTH DAILY 45 tablet 2   tamsulosin (FLOMAX) 0.4 MG CAPS capsule Take 1 capsule (0.4 mg total) by mouth in the morning and at bedtime. 60 capsule 11   thiamine (VITAMIN B-1) 100 MG tablet Take 100 mg by  mouth daily.     vitamin B-12 (CYANOCOBALAMIN) 1000 MCG tablet Take 1,000 mcg by mouth daily.      vitamin E 180 MG (400 UNITS) capsule Take 400 Units by mouth daily.     No current facility-administered medications on file prior to visit.    PAST MEDICAL HISTORY: Past Medical History:  Diagnosis Date   Arthritis    "all over"   Chronic lower back pain    Early cataracts, bilateral    Family history of adverse reaction to anesthesia    "son had ligament repair of elbow in 2016; woke up nauseated and disoriented"   Hypercholesterolemia    Hypertension    Migraine    "none in years" (10/12/2015)   Restless leg syndrome    Wears glasses     PAST SURGICAL HISTORY: Past Surgical History:  Procedure Laterality Date   ANTERIOR CERVICAL DECOMP/DISCECTOMY FUSION  ~ 2005   ANTERIOR CERVICAL DISCECTOMY     BACK SURGERY     CARDIAC CATHETERIZATION N/A 10/12/2015   Procedure: Left Heart Cath and Coronary Angiography;  Surgeon: Yates Decamp, MD;  Location: [facility] INVASIVE CV LAB;  Service: Cardiovascular;  Laterality: N/A;   CARDIAC CATHETERIZATION  10/12/2015   Procedure: Coronary Stent Intervention;  Surgeon: Yates Decamp, MD;  Location: [facility] INVASIVE CV LAB;  Service: Cardiovascular;;   COLONOSCOPY     CORONARY ANGIOPLASTY  10/12/2015   DES RCA   CYSTOSCOPY WITH INSERTION OF UROLIFT N/A 05/02/2021   Procedure: CYSTOSCOPY WITH INSERTION OF UROLIFT;  Surgeon: Malen Gauze, MD;  Location: [facility] ORS;  Service: Urology;  Laterality: N/A;   EYE SURGERY Left    cataract   FRACTURE SURGERY     nose repaired from accident   GLAUCOMA SURGERY Left 03/04/2015   JOINT REPLACEMENT     KNEE ARTHROSCOPY Bilateral    LUMBAR DISC SURGERY  ~ 1986-02/08/2007 X 6   MAXIMUM ACCESS (MAS)POSTERIOR LUMBAR INTERBODY FUSION (PLIF) 3 LEVEL  02/08/2007   w/rods and screws   SPINAL CORD STIMULATOR INSERTION N/A 05/08/2014   Procedure: LUMBAR SPINAL CORD STIMULATOR IMPLANT ;  Surgeon: Gwynne Edinger, MD;  Location: [facility]  NEURO ORS;  Service: Neurosurgery;  Laterality: N/A;   TOE FUSION Right 06/02/2014   "outside part of big toe"   TOTAL KNEE ARTHROPLASTY Left ~ 2011    FAMILY HISTORY: The patient's family history includes Aneurysm in his mother; Cancer in an other family member; Heart attack in his father; Hypertension in his father.   SOCIAL HISTORY:  The patient  reports that he has never smoked. He has never used smokeless tobacco. He reports that he does not currently use alcohol after a past usage of about 3.0 standard drinks of alcohol per week. He reports that he does not  use drugs.  Review of Systems  Cardiovascular:  Positive for dyspnea on exertion (better). Negative for chest pain, leg swelling, near-syncope, orthopnea, palpitations, paroxysmal nocturnal dyspnea and syncope.  Respiratory:  Positive for shortness of breath (better).     PHYSICAL EXAM:    01/05/2022    8:47 AM 12/22/2021    3:38 PM 12/22/2021    3:31 PM  Vitals with BMI  Height 5\' 9"   5\' 9"   Weight 198 lbs 13 oz  202 lbs  BMI [id number]  123456  Systolic 123456 0000000 [id number]  Diastolic 69 93 90  Pulse 123456 106 106    CONSTITUTIONAL: Well-developed and well-nourished. No acute distress.  SKIN: Skin is warm and dry. No rash noted. No cyanosis. No pallor. No jaundice HEAD: Normocephalic and atraumatic.  EYES: No scleral icterus MOUTH/THROAT: Moist oral membranes.  NECK: Mild JVD present. No thyromegaly noted. CHEST Normal respiratory effort. No intercostal retractions  LUNGS: Clear to auscultation bilaterally.  No stridor. No wheezes. No rales.  CARDIOVASCULAR: Tachycardic, regular rate and rhythm, positive S1-S2, no murmurs rubs or gallops appreciated. ABDOMINAL: Soft, nontender, nondistended, positive bowel sounds in all 4 quadrants.  No apparent ascites.  EXTREMITIES: No bilateral pitting edema. warm to touch bilaterally.  With 2+ dorsalis pedis and posterior tibial pulses. HEMATOLOGIC: No significant bruising NEUROLOGIC: Oriented  to person, place, and time. Nonfocal. Normal muscle tone.  PSYCHIATRIC: Normal mood and affect. Normal behavior. Cooperative  CARDIAC DATABASE: EKG: October 25, 2021: Normal sinus rhythm, 86 bpm, occasional PACs, without underlying injury pattern.  Echocardiogram: 01/19/2020: LVEF 45-50%, mild global hypokinesis, moderate LVH, grade 1 diastolic impairment, normal LAP, moderately dilated left atrium, mild AR, moderate MR.  12/28/2021:  1. Left ventricular ejection fraction, by estimation, is 40 to 45%. The  left ventricle has mildly decreased function. The left ventricle  demonstrates regional wall motion abnormalities (The entire septum is hypokinetic). There is mild left  ventricular hypertrophy. Left ventricular diastolic parameters are  consistent with Grade II diastolic dysfunction (pseudonormalization).  Elevated left atrial pressure. The E/e' is 19.5. The average left  ventricular global longitudinal strain is -11.6 %.  The global longitudinal strain is abnormal.   2. Right ventricular systolic function is normal. The right ventricular  size is normal.   3. Left atrial size was severely dilated.   4. The mitral valve is grossly normal. Mild to moderate mitral valve  regurgitation. No evidence of mitral stenosis.   5. The aortic valve is tricuspid. Aortic valve regurgitation is not  visualized. Aortic valve sclerosis is present, with no evidence of aortic  valve stenosis.   Stress Testing:  Lexiscan/modified Bruce Tetrofosmin stress test 01/19/2020:  Lexiscan/modified Bruce nuclear stress test performed using 1-day protocol.  Stress EKG is non-diagnostic, as this is pharmacological stress test. 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  Normal myocardial perfusion. Stress LVEF calculated 44%, although visually appears normal.  Intermediate risk study due to reduced LVEF. Recommend clinical correlation.   Heart  Catheterization: Coronary angiogram 10/12/2015: Normal LV systolic function. Moderate disease in the mid circumflex coronary artery of 50%. Diffuse coronary calcification involving the proximal LAD. Right coronary artery very large and dominant. Proximal segment has ulcerated 90% stenosis. Mild amount of calcification is also evident. Stenting proximal large dominant RCA for an ulcerated 90% stenosis to 0% with implantation of a 4.0 x 18 mm resolute DES.  Vascular imaging: Abdominal aortic duplex 05/30/2017: Focal plaque noted in the  proximal, mid and distal aorta. The maximum aorta diameter is 2.82 cm (dist). No AAA. Normal iliac velocity.  LABORATORY DATA:    Latest Ref Rng & Units 06/06/2020    1:02 PM 04/29/2014   11:57 AM 04/22/2010    5:19 AM  CBC  WBC 4.0 - 10.5 K/uL 11.1  6.5  13.3   Hemoglobin 13.0 - 17.0 g/dL 14.0  13.0  9.7   Hematocrit 39.0 - 52.0 % 43.4  38.4  29.5   Platelets 150 - 400 K/uL 217  210  146        Latest Ref Rng & Units 12/20/2021    1:55 PM 03/29/2021   12:08 PM 03/14/2021    2:16 PM  CMP  Glucose 70 - 99 mg/dL 109  98  86   BUN 8 - 27 mg/dL 10  12  20    Creatinine 0.76 - 1.27 mg/dL 0.79  0.85  1.16   Sodium 134 - 144 mmol/L 139  141  143   Potassium 3.5 - 5.2 mmol/L 4.5  4.2  4.3   Chloride 96 - 106 mmol/L 101  105  104   CO2 20 - 29 mmol/L 24  26  26    Calcium 8.6 - 10.2 mg/dL 9.2  9.3  8.8     Lipid Panel  No results found for: "CHOL", "TRIG", "HDL", "CHOLHDL", "VLDL", "LDLCALC", "LDLDIRECT", "LABVLDL"  Lab Results  Component Value Date   HGBA1C  04/20/2010    5.6 (NOTE)                                                                       According to the ADA Clinical Practice Recommendations for 2011, when HbA1c is used as a screening test:   >=6.5%   Diagnostic of Diabetes Mellitus           (if abnormal result  is confirmed)  5.7-6.4%   Increased risk of developing Diabetes Mellitus  References:Diagnosis and Classification of Diabetes  [place],Diabetes [id number] 1):S62-S69 and Standards of Medical Care in         Diabetes - 2011,Diabetes Care,2011,34  (Suppl 1):S11-S61.   No components found for: "NTPROBNP" No results found for: "TSH"  Cardiac Panel (last 3 results) No results for input(s): "CKTOTAL", "CKMB", "TROPONINIHS", "RELINDX" in the last 72 hours.  IMPRESSION:    ICD-10-CM   1. Acute combined systolic and diastolic heart failure (HCC)  I50.41 furosemide (LASIX) 40 MG tablet    PCV MYOCARDIAL PERFUSION WITH LEXISCAN    Pro b natriuretic peptide (BNP)    Magnesium    Basic metabolic panel    2. Ischemic cardiomyopathy  I25.5 PCV MYOCARDIAL PERFUSION WITH LEXISCAN    3. Atherosclerosis of native coronary artery of native heart without angina pectoris  I25.10 PCV MYOCARDIAL PERFUSION WITH LEXISCAN    4. History of coronary angioplasty with insertion of stent  Z95.5 PCV MYOCARDIAL PERFUSION WITH LEXISCAN    5. Hypercholesterolemia  E78.00     6. Essential hypertension  I10        RECOMMENDATIONS: THOMAS BUITRAGO is a 76 y.o. male whose past medical history and cardiovascular risk factors include: Established coronary artery disease without angina pectoris, status post stenting in the RCA in  April 2017, hypertension, hyperlipidemia, prediabetes, advanced age.  Acute combined systolic and diastolic heart failure (HCC) Stage C, NYHA class II/III. Symptoms started May/June 2023. NT-proBNP improving, clinically less short of breath and no longer experiencing PND orthopnea. Unable to tolerate Bumex and Isordil secondary to migraines/myalgias. We will increase his maintenance dose of furosemide from 20 mg p.o. daily to 40 mg p.o. daily Home blood pressure log reviewed. Medications reconciled. Chest x-ray noted mild CHF with air-fluid levels in the small bowel -I have asked him to discuss these findings with PCP and consider an abdominal x-ray.  Echocardiogram -mildly reduced LVEF with regional  wall motion abnormalities (RWMA are new compared to the past).  Given his history of CAD, ischemic cardiomyopathy, newly discovered heart failure, and regional wall motion abnormalities ischemic work-up is warranted.  We discussed both stress testing and invasive angiography and after discussing the risks, benefits, and limitations the shared decision was to proceed with nuclear stress test to evaluate for reversible ischemia. Patient does not have anginal discomfort and therefore noninvasive work-up for now is appropriate.  If he does develop anginal discomfort he is asked to go to the closest ER via EMS for further evaluation and management.  Ischemic cardiomyopathy Echo 12/2021: LVEF 45% with regional wall motion abnormalities, abnormal LV strain, and grade 2 diastolic dysfunction. Nuclear stress test evaluate for reversible ischemia- patient is unable to exercise so will proceed w/ pharmacological stress. See above  Atherosclerosis of native coronary artery of native heart without angina pectoris /status post PCI to the RCA Denies angina pectoris. No use of sublingual nitroglycerin tablets. See above  Hypercholesterolemia Currently on Crestor. Monitor lipid profile  Essential hypertension Home and office blood pressures are well controlled. Medications reconciled.  FINAL MEDICATION LIST END OF ENCOUNTER: Meds ordered this encounter  Medications   furosemide (LASIX) 40 MG tablet    Sig: Take 1 tablet (40 mg total) by mouth every morning.    Dispense:  90 tablet    Refill:  0     Medications Discontinued During This Encounter  Medication Reason   bumetanide (BUMEX) 1 MG tablet    isosorbide dinitrate (ISORDIL) 20 MG tablet    ketoconazole (NIZORAL) 2 % cream    mirabegron ER (MYRBETRIQ) 25 MG TB24 tablet    AMBULATORY NON FORMULARY MEDICATION       Current Outpatient Medications:    amitriptyline (ELAVIL) 25 MG tablet, Take 25 mg by mouth at bedtime. , Disp: , Rfl:     aspirin EC 81 MG tablet, Take 81 mg by mouth daily., Disp: , Rfl:    carvedilol (COREG) 25 MG tablet, Take 1 tablet (25 mg total) by mouth 2 (two) times daily., Disp: 60 tablet, Rfl: 0   Cholecalciferol 50 MCG (2000 UT) CAPS, Take 2,000 Units by mouth daily. , Disp: , Rfl:    diclofenac sodium (VOLTAREN) 1 % GEL, Apply 4 g topically 4 (four) times daily. (Patient taking differently: Apply 4 g topically 4 (four) times daily as needed (pain).), Disp: 5 Tube, Rfl: 1   doxazosin (CARDURA) 4 MG tablet, Take 4 mg by mouth daily., Disp: , Rfl:    DULoxetine (CYMBALTA) 20 MG capsule, TAKE 1 CAPSULE(20 MG) BY MOUTH DAILY, Disp: 30 capsule, Rfl: 3   ENTRESTO 97-103 MG, TAKE 1 TABLET BY MOUTH TWICE DAILY, Disp: 60 tablet, Rfl: 3   furosemide (LASIX) 40 MG tablet, Take 1 tablet (40 mg total) by mouth every morning., Disp: 90 tablet, Rfl: 0   hydrALAZINE (  APRESOLINE) 50 MG tablet, TAKE 1 TABLET(50 MG) BY MOUTH THREE TIMES DAILY, Disp: 90 tablet, Rfl: 2   lubiprostone (AMITIZA) 8 MCG capsule, Take 8 mcg by mouth 2 (two) times daily with a meal., Disp: , Rfl:    methocarbamol (ROBAXIN) 750 MG tablet, Take 750 mg by mouth every 6 (six) hours as needed for muscle spasms (after playing gulf)., Disp: , Rfl:    nitroGLYCERIN (NITROSTAT) 0.4 MG SL tablet, Place 1 tablet (0.4 mg total) under the tongue every 5 (five) minutes as needed for chest pain. If you require more than two tablets five minutes apart go to the nearest ER via EMS., Disp: 30 tablet, Rfl: 0   potassium chloride (KLOR-CON) 10 MEQ tablet, TAKE 1 TABLET BY MOUTH DAILY, Disp: 30 tablet, Rfl: 6   pyridoxine (B-6) 100 MG tablet, Take 100 mg by mouth daily., Disp: , Rfl:    rOPINIRole (REQUIP) 0.5 MG tablet, Take 1.5 mg by mouth in the morning and at bedtime. Afternoon and bedtime, Disp: , Rfl:    rosuvastatin (CRESTOR) 40 MG tablet, Take 20 mg by mouth at bedtime., Disp: , Rfl:    spironolactone (ALDACTONE) 25 MG tablet, TAKE 1/2 TABLET(12.5 MG) BY MOUTH  DAILY, Disp: 45 tablet, Rfl: 2   tamsulosin (FLOMAX) 0.4 MG CAPS capsule, Take 1 capsule (0.4 mg total) by mouth in the morning and at bedtime., Disp: 60 capsule, Rfl: 11   thiamine (VITAMIN B-1) 100 MG tablet, Take 100 mg by mouth daily., Disp: , Rfl:    vitamin B-12 (CYANOCOBALAMIN) 1000 MCG tablet, Take 1,000 mcg by mouth daily. , Disp: , Rfl:    vitamin E 180 MG (400 UNITS) capsule, Take 400 Units by mouth daily., Disp: , Rfl:   Orders Placed This Encounter  Procedures   PCV MYOCARDIAL PERFUSION WITH LEXISCAN    --Continue cardiac medications as reconciled in final medication list. --Return in about 3 weeks (around 01/26/2022) for Follow up, heart failure management.. Or sooner if needed. --Continue follow-up with your primary care physician regarding the management of your other chronic comorbid conditions.  Patient's questions and concerns were addressed to his satisfaction. He voices understanding of the instructions provided during this encounter.   This note was created using a voice recognition software as a result there may be grammatical errors inadvertently enclosed that do not reflect the nature of this encounter. Every attempt is made to correct such errors.   Rex Kras, [place], [facility]  Pager: [phone] Office: [phone]

## 2022-01-05 NOTE — Telephone Encounter (Signed)
Spoke to the patient during today's visit.   ST

## 2022-01-06 NOTE — Progress Notes (Signed)
Pt was seen in office 01/05/2022.

## 2022-01-11 ENCOUNTER — Encounter: Payer: Self-pay | Admitting: Cardiology

## 2022-01-11 ENCOUNTER — Ambulatory Visit: Payer: Medicare PPO

## 2022-01-11 DIAGNOSIS — Z955 Presence of coronary angioplasty implant and graft: Secondary | ICD-10-CM

## 2022-01-11 DIAGNOSIS — I2510 Atherosclerotic heart disease of native coronary artery without angina pectoris: Secondary | ICD-10-CM | POA: Diagnosis not present

## 2022-01-11 DIAGNOSIS — I255 Ischemic cardiomyopathy: Secondary | ICD-10-CM

## 2022-01-11 DIAGNOSIS — I5041 Acute combined systolic (congestive) and diastolic (congestive) heart failure: Secondary | ICD-10-CM | POA: Diagnosis not present

## 2022-01-13 ENCOUNTER — Other Ambulatory Visit: Payer: Self-pay | Admitting: Cardiology

## 2022-01-13 DIAGNOSIS — I255 Ischemic cardiomyopathy: Secondary | ICD-10-CM

## 2022-01-13 DIAGNOSIS — R9439 Abnormal result of other cardiovascular function study: Secondary | ICD-10-CM

## 2022-01-13 DIAGNOSIS — I5041 Acute combined systolic (congestive) and diastolic (congestive) heart failure: Secondary | ICD-10-CM | POA: Diagnosis not present

## 2022-01-13 NOTE — Progress Notes (Signed)
   PCV MYOCARDIAL PERFUSION WITH LEXISCAN 01/11/2022  Narrative Lexiscan Tetrofosmin stress test 01/11/2022: Abnormal nuclear stress test 1 Day Rest/Stress protocol. Stress EKG is non-diagnostic -target heart rate not achieved. Medium size, mild to moderate intensity reversible perfusion defect involving the basal to distal inferior segments suggestive of ischemia in the RCA distribution. Small size, mild to moderate intensity reversible perfusion defect in the basal to mid inferoseptal segments suggestive of ischemia in the RCA distribution. Left ventricular size dilated, calculated LVEF 24%, wall motion severely reduced with global and regional wall motion abnormality. Prior study 01/19/2020 reported normal myocardial perfusion, calculated LVEF 44% but visually appears normal. High risk study.  Clinical correlation required.  Spoke to the patient over the phone regarding the results of the nuclear stress test.  Given the new onset of heart failure, dilated LV cavity on SPECT image, reversible perfusion defect recommend left heart catheterization with possible intervention.  The procedure of left heart catheterization with possible intervention was explained to the patient in detail.  The indication, alternatives, risks and benefits were reviewed.  Complications include but not limited to bleeding, infection, vascular injury, stroke, myocardial infarction, arrhythmia (requiring medical or cardiopulmonary resuscitation), kidney injury (requiring short-term or long-term hemodialysis), radiation-related injury in the case of prolonged fluoroscopy use, emergent cardiac surgery, temporary or permanent pacemaker, and death. The patient understands the risks of serious complication is 1-2 in 1000 with diagnostic cardiac cath and 1-2% or less with angioplasty/stenting.  The patient voices understanding and provides verbal feedback his questions and concerns are addressed to his satisfaction and patient  wishes to proceed with coronary angiography with possible PCI.  Orders Placed This Encounter  Procedures   Basic metabolic panel    Standing Status:   Future    Standing Expiration Date:   01/14/2023   CBC    Standing Status:   Future    Standing Expiration Date:   01/14/2023    Telephone encounter: 17 minutes  Ahmaud Duthie, DO, FACC  Pager: [phone] Office: [phone]   

## 2022-01-13 NOTE — H&P (View-Only) (Signed)
   PCV MYOCARDIAL PERFUSION WITH LEXISCAN 01/11/2022  Narrative Lexiscan Tetrofosmin stress test 01/11/2022: Abnormal nuclear stress test 1 Day Rest/Stress protocol. Stress EKG is non-diagnostic -target heart rate not achieved. Medium size, mild to moderate intensity reversible perfusion defect involving the basal to distal inferior segments suggestive of ischemia in the RCA distribution. Small size, mild to moderate intensity reversible perfusion defect in the basal to mid inferoseptal segments suggestive of ischemia in the RCA distribution. Left ventricular size dilated, calculated LVEF 24%, wall motion severely reduced with global and regional wall motion abnormality. Prior study 01/19/2020 reported normal myocardial perfusion, calculated LVEF 44% but visually appears normal. High risk study.  Clinical correlation required.  Spoke to the patient over the phone regarding the results of the nuclear stress test.  Given the new onset of heart failure, dilated LV cavity on SPECT image, reversible perfusion defect recommend left heart catheterization with possible intervention.  The procedure of left heart catheterization with possible intervention was explained to the patient in detail.  The indication, alternatives, risks and benefits were reviewed.  Complications include but not limited to bleeding, infection, vascular injury, stroke, myocardial infarction, arrhythmia (requiring medical or cardiopulmonary resuscitation), kidney injury (requiring short-term or long-term hemodialysis), radiation-related injury in the case of prolonged fluoroscopy use, emergent cardiac surgery, temporary or permanent pacemaker, and death. The patient understands the risks of serious complication is 1-2 in 1000 with diagnostic cardiac cath and 1-2% or less with angioplasty/stenting.  The patient voices understanding and provides verbal feedback his questions and concerns are addressed to his satisfaction and patient  wishes to proceed with coronary angiography with possible PCI.  Orders Placed This Encounter  Procedures   Basic metabolic panel    Standing Status:   Future    Standing Expiration Date:   01/14/2023   CBC    Standing Status:   Future    Standing Expiration Date:   01/14/2023    Telephone encounter: 17 minutes  Delilah Shan [facility]  Pager: [phone] Office: [phone]

## 2022-01-16 ENCOUNTER — Encounter: Payer: Self-pay | Admitting: Cardiology

## 2022-01-16 DIAGNOSIS — R9439 Abnormal result of other cardiovascular function study: Secondary | ICD-10-CM | POA: Diagnosis not present

## 2022-01-17 ENCOUNTER — Ambulatory Visit (HOSPITAL_COMMUNITY)
Admission: RE | Admit: 2022-01-17 | Discharge: 2022-01-17 | Disposition: A | Discharge status: Home / Self Care | Payer: Medicare PPO | Attending: Cardiology | Admitting: Cardiology

## 2022-01-17 ENCOUNTER — Encounter (HOSPITAL_COMMUNITY)
Admission: RE | Disposition: A | Discharge status: Home / Self Care | Payer: Self-pay | Source: Home / Self Care | Attending: Cardiology

## 2022-01-17 ENCOUNTER — Other Ambulatory Visit: Payer: Self-pay

## 2022-01-17 ENCOUNTER — Encounter (HOSPITAL_COMMUNITY): Payer: Self-pay | Admitting: Cardiology

## 2022-01-17 DIAGNOSIS — I2510 Atherosclerotic heart disease of native coronary artery without angina pectoris: Secondary | ICD-10-CM

## 2022-01-17 DIAGNOSIS — Z955 Presence of coronary angioplasty implant and graft: Secondary | ICD-10-CM | POA: Insufficient documentation

## 2022-01-17 DIAGNOSIS — I2584 Coronary atherosclerosis due to calcified coronary lesion: Secondary | ICD-10-CM | POA: Diagnosis not present

## 2022-01-17 DIAGNOSIS — I509 Heart failure, unspecified: Secondary | ICD-10-CM | POA: Insufficient documentation

## 2022-01-17 DIAGNOSIS — R0609 Other forms of dyspnea: Secondary | ICD-10-CM | POA: Diagnosis not present

## 2022-01-17 DIAGNOSIS — I10 Essential (primary) hypertension: Secondary | ICD-10-CM | POA: Diagnosis not present

## 2022-01-17 DIAGNOSIS — R079 Chest pain, unspecified: Secondary | ICD-10-CM | POA: Diagnosis present

## 2022-01-17 DIAGNOSIS — E785 Hyperlipidemia, unspecified: Secondary | ICD-10-CM | POA: Diagnosis not present

## 2022-01-17 HISTORY — PX: LEFT HEART CATH AND CORONARY ANGIOGRAPHY: CATH118249

## 2022-01-17 HISTORY — DX: Atherosclerotic heart disease of native coronary artery without angina pectoris: I25.10

## 2022-01-17 HISTORY — DX: Heart failure, unspecified: I50.9

## 2022-01-17 LAB — BASIC METABOLIC PANEL
BUN/Creatinine Ratio: 13 (ref 10–24)
BUN: 13 mg/dL (ref 8–27)
CO2: 28 mmol/L (ref 20–29)
Calcium: 9.3 mg/dL (ref 8.6–10.2)
Chloride: 101 mmol/L (ref 96–106)
Creatinine, Ser: 1.03 mg/dL (ref 0.76–1.27)
Glucose: 111 mg/dL — ABNORMAL HIGH (ref 70–99)
Potassium: 4.5 mmol/L (ref 3.5–5.2)
Sodium: 140 mmol/L (ref 134–144)
eGFR: 76 mL/min/{1.73_m2} (ref >59)

## 2022-01-17 LAB — CBC
Hematocrit: 35.1 % — ABNORMAL LOW (ref 37.5–51.0)
Hemoglobin: 11.8 g/dL — ABNORMAL LOW (ref 13.0–17.7)
MCH: 31.3 pg (ref 26.6–33.0)
MCHC: 33.6 g/dL (ref 31.5–35.7)
MCV: 93 fL (ref 79–97)
Platelets: 211 10*3/uL (ref 150–450)
RBC: 3.77 x10E6/uL — ABNORMAL LOW (ref 4.14–5.80)
RDW: 12.4 % (ref 11.6–15.4)
WBC: 5 10*3/uL (ref 3.4–10.8)

## 2022-01-17 SURGERY — LEFT HEART CATH AND CORONARY ANGIOGRAPHY
Anesthesia: LOCAL

## 2022-01-17 MED ORDER — HEPARIN (PORCINE) IN NACL 1000-0.9 UT/500ML-% IV SOLN
INTRAVENOUS | Status: DC | PRN
  Administered 2022-01-17 (×2): 500 mL

## 2022-01-17 MED ORDER — SODIUM CHLORIDE 0.9 % WEIGHT BASED INFUSION: 1.0000 mL/kg/h | INTRAVENOUS | Status: DC

## 2022-01-17 MED ORDER — VERAPAMIL HCL 2.5 MG/ML IV SOLN
INTRAVENOUS | Status: DC | PRN
  Administered 2022-01-17: 5 mL via INTRA_ARTERIAL

## 2022-01-17 MED ORDER — MIDAZOLAM HCL 2 MG/2ML IJ SOLN
INTRAMUSCULAR | Status: AC
  Filled 2022-01-17: qty 2

## 2022-01-17 MED ORDER — HEPARIN SODIUM (PORCINE) 1000 UNIT/ML IJ SOLN
INTRAMUSCULAR | Status: AC
  Filled 2022-01-17: qty 10

## 2022-01-17 MED ORDER — SODIUM CHLORIDE 0.9 % WEIGHT BASED INFUSION
3.0000 mL/kg/h | INTRAVENOUS | Status: DC
  Administered 2022-01-17: 3 mL/kg/h via INTRAVENOUS

## 2022-01-17 MED ORDER — SODIUM CHLORIDE 0.9% FLUSH: 3.0000 mL | Freq: Two times a day (BID) | INTRAVENOUS | Status: DC

## 2022-01-17 MED ORDER — HEPARIN (PORCINE) IN NACL 1000-0.9 UT/500ML-% IV SOLN
INTRAVENOUS | Status: AC
  Filled 2022-01-17: qty 500

## 2022-01-17 MED ORDER — FENTANYL CITRATE (PF) 100 MCG/2ML IJ SOLN
INTRAMUSCULAR | Status: AC
  Filled 2022-01-17: qty 2

## 2022-01-17 MED ORDER — IOHEXOL 350 MG/ML SOLN
INTRAVENOUS | Status: DC | PRN
  Administered 2022-01-17: 50 mL

## 2022-01-17 MED ORDER — SODIUM CHLORIDE 0.9% FLUSH: 3.0000 mL | INTRAVENOUS | Status: DC | PRN

## 2022-01-17 MED ORDER — LIDOCAINE HCL (PF) 1 % IJ SOLN
INTRAMUSCULAR | Status: DC | PRN
  Administered 2022-01-17: 2 mL via INTRADERMAL

## 2022-01-17 MED ORDER — SODIUM CHLORIDE 0.9 % IV SOLN: 250.0000 mL | INTRAVENOUS | Status: DC | PRN

## 2022-01-17 MED ORDER — VERAPAMIL HCL 2.5 MG/ML IV SOLN
INTRAVENOUS | Status: AC
  Filled 2022-01-17: qty 2

## 2022-01-17 MED ORDER — MIDAZOLAM HCL 2 MG/2ML IJ SOLN
INTRAMUSCULAR | Status: DC | PRN
  Administered 2022-01-17: 1 mg via INTRAVENOUS
  Administered 2022-01-17: 2 mg via INTRAVENOUS

## 2022-01-17 MED ORDER — FENTANYL CITRATE (PF) 100 MCG/2ML IJ SOLN
INTRAMUSCULAR | Status: DC | PRN
  Administered 2022-01-17: 50 ug via INTRAVENOUS

## 2022-01-17 MED ORDER — ASPIRIN 81 MG PO CHEW: 81.0000 mg | CHEWABLE_TABLET | ORAL | Status: DC

## 2022-01-17 MED ORDER — HEPARIN SODIUM (PORCINE) 1000 UNIT/ML IJ SOLN
INTRAMUSCULAR | Status: DC | PRN
  Administered 2022-01-17: 4000 [IU] via INTRAVENOUS

## 2022-01-17 MED ORDER — LIDOCAINE HCL (PF) 1 % IJ SOLN
INTRAMUSCULAR | Status: AC
  Filled 2022-01-17: qty 30

## 2022-01-17 SURGICAL SUPPLY — 9 items
BAND ZEPHYR COMPRESS 30 LONG (HEMOSTASIS) ×1 IMPLANT
CATH OPTITORQUE TIG 4.0 5F (CATHETERS) ×1 IMPLANT
GLIDESHEATH SLEND A-KIT 6F 22G (SHEATH) ×1 IMPLANT
GUIDEWIRE INQWIRE 1.5J.035X260 (WIRE) IMPLANT
INQWIRE 1.5J .035X260CM (WIRE) ×2
KIT HEART LEFT (KITS) ×2 IMPLANT
PACK CARDIAC CATHETERIZATION (CUSTOM PROCEDURE TRAY) ×2 IMPLANT
TRANSDUCER W/STOPCOCK (MISCELLANEOUS) ×2 IMPLANT
TUBING CIL FLEX 10 FLL-RA (TUBING) ×2 IMPLANT

## 2022-01-17 NOTE — Discharge Instructions (Signed)

## 2022-01-17 NOTE — Interval H&P Note (Signed)
History and Physical Interval Note:  01/17/2022 3:39 PM  Brian Gallegos  has presented today for surgery, with the diagnosis of chest pain.  The various methods of treatment have been discussed with the patient and family. After consideration of risks, benefits and other options for treatment, the patient has consented to  Procedure(s): LEFT HEART CATH AND CORONARY ANGIOGRAPHY (N/A) and possible angioplasty as a surgical intervention.  The patient's history has been reviewed, patient examined, no change in status, stable for surgery.  I have reviewed the patient's chart and labs.  Questions were answered to the patient's satisfaction.   Cath Lab Visit (complete for each Cath Lab visit)  Clinical Evaluation Leading to the Procedure:   ACS: No.  Non-ACS:    Anginal Classification: CCS III  Anti-ischemic medical therapy: Maximal Therapy (2 or more classes of medications)  Non-Invasive Test Results: High-risk stress test findings: cardiac mortality >3%/year  Prior CABG: No previous CABG   Yates Decamp

## 2022-01-17 NOTE — Progress Notes (Signed)
Patient and daughter was given discharge instructions. Both verbalized understanding. 

## 2022-01-18 ENCOUNTER — Encounter (HOSPITAL_COMMUNITY): Payer: Self-pay | Admitting: Cardiology

## 2022-01-19 ENCOUNTER — Encounter: Payer: Self-pay | Admitting: Cardiology

## 2022-01-19 NOTE — Telephone Encounter (Signed)
From patient.

## 2022-01-27 ENCOUNTER — Encounter: Payer: Self-pay | Admitting: Cardiology

## 2022-01-27 ENCOUNTER — Ambulatory Visit: Payer: Medicare PPO | Admitting: Cardiology

## 2022-01-27 VITALS — BP 123/74 | HR 100 | Temp 97.7°F | Resp 16 | Ht 69.0 in | Wt 195.0 lb

## 2022-01-27 DIAGNOSIS — I5041 Acute combined systolic (congestive) and diastolic (congestive) heart failure: Secondary | ICD-10-CM

## 2022-01-27 DIAGNOSIS — I10 Essential (primary) hypertension: Secondary | ICD-10-CM | POA: Diagnosis not present

## 2022-01-27 DIAGNOSIS — Z955 Presence of coronary angioplasty implant and graft: Secondary | ICD-10-CM

## 2022-01-27 DIAGNOSIS — E7800 Pure hypercholesterolemia, unspecified: Secondary | ICD-10-CM

## 2022-01-27 DIAGNOSIS — I2510 Atherosclerotic heart disease of native coronary artery without angina pectoris: Secondary | ICD-10-CM

## 2022-01-27 DIAGNOSIS — I255 Ischemic cardiomyopathy: Secondary | ICD-10-CM

## 2022-01-27 MED ORDER — EMPAGLIFLOZIN 10 MG PO TABS: 10.0000 mg | ORAL_TABLET | Freq: Every morning | ORAL | 0 refills | Status: DC

## 2022-01-27 MED ORDER — FUROSEMIDE 20 MG PO TABS: 20.0000 mg | ORAL_TABLET | ORAL | 0 refills | Status: DC

## 2022-01-27 NOTE — Progress Notes (Signed)
Brian Gallegos Date of Birth: [birth year] MRN: [id number] Primary Care Provider:Miller, Misty Stanley, MD Former Cardiology Providers: Altamese Searcy, APRN, FNP-C Primary Cardiologist: Tessa Lerner, DO, [facility] (established care 12/30/2019)  Date: 01/27/22 Last Office Visit: 01/05/2022  Chief Complaint  Patient presents with   Acute combined systolic and diastolic heart failure   Follow-up    Post cath    HPI  Brian Gallegos is a 76 y.o.  male whose past medical history and cardiovascular risk factors include: Established coronary artery disease without angina pectoris, status post stenting in the RCA in April 2017, hypertension, hyperlipidemia, prediabetes, advanced age.  No history of CAD with prior PCI in 2017 and since then he been managed medically for his underlying CAD and mild HFrEF.  In June 2023 he presented to the office with worsening shortness of breath, dyspnea on exertion, weight gain, orthopnea and PND.  His NT proBNP was significantly elevated compared to August 2022.  His GDMT was uptitrated and given the worsening heart failure symptoms compared to his baseline shared decision was to proceed with ischemic work-up.  Nuclear stress test was concerning for reversible perfusion defect, LV cavity was dilated per SPECT images, and given the worsening heart failure symptoms the shared decision was to proceed with left heart catheterization to evaluate for obstructive CAD.   Since last office visit he did undergo left heart catheterization and was noted to have no significant new epicardial coronary disease and prior PCI's were patent..  Clinically he denies anginal discomfort, orthopnea, PND, lower extremity swelling.  He has lost 3 pounds since last office visit.  He still complains of dyspnea on exertion and wheezing after taking a deep breath.   Patient is requesting a referral to advanced heart failure -wanting to see Dr. Shirlee Latch.   ALLERGIES: Allergies  Allergen Reactions    Bisacodyl Other (See Comments)    Severe stomach cramps   Codeine Itching    Low amount is tolerated   Finasteride Hives and Rash   MEDICATION LIST PRIOR TO VISIT: Current Outpatient Medications on File Prior to Visit  Medication Sig Dispense Refill   amitriptyline (ELAVIL) 50 MG tablet Take 50 mg by mouth at bedtime.     aspirin EC 81 MG tablet Take 81 mg by mouth daily.     carvedilol (COREG) 25 MG tablet Take 1 tablet (25 mg total) by mouth 2 (two) times daily. 60 tablet 0   Cholecalciferol 25 MCG (1000 UT) tablet Take 1,000 Units by mouth daily.     doxazosin (CARDURA) 4 MG tablet Take 4 mg by mouth every morning.     DULoxetine (CYMBALTA) 20 MG capsule TAKE 1 CAPSULE(20 MG) BY MOUTH DAILY 30 capsule 3   ENTRESTO 97-103 MG TAKE 1 TABLET BY MOUTH TWICE DAILY 60 tablet 3   hydrALAZINE (APRESOLINE) 50 MG tablet TAKE 1 TABLET(50 MG) BY MOUTH THREE TIMES DAILY (Patient taking differently: Take 50 mg by mouth 2 (two) times daily.) 90 tablet 2   lubiprostone (AMITIZA) 8 MCG capsule Take 8 mcg by mouth 2 (two) times daily with a meal.     methocarbamol (ROBAXIN) 750 MG tablet Take 750 mg by mouth at bedtime.     nitroGLYCERIN (NITROSTAT) 0.4 MG SL tablet Place 1 tablet (0.4 mg total) under the tongue every 5 (five) minutes as needed for chest pain. If you require more than two tablets five minutes apart go to the nearest ER via EMS. 30 tablet 0   potassium chloride (KLOR-CON) 10  MEQ tablet TAKE 1 TABLET BY MOUTH DAILY (Patient taking differently: Take 10 mEq by mouth once.) 30 tablet 6   pyridoxine (B-6) 100 MG tablet Take 100 mg by mouth daily.     rOPINIRole (REQUIP) 0.5 MG tablet Take 1.5 mg by mouth at bedtime.     rosuvastatin (CRESTOR) 20 MG tablet Take 20 mg by mouth at bedtime.     spironolactone (ALDACTONE) 25 MG tablet TAKE 1/2 TABLET(12.5 MG) BY MOUTH DAILY 45 tablet 2   tadalafil (CIALIS) 20 MG tablet Take 20 mg by mouth daily as needed for erectile dysfunction.     tamsulosin  (FLOMAX) 0.4 MG CAPS capsule Take 1 capsule (0.4 mg total) by mouth in the morning and at bedtime. 60 capsule 11   No current facility-administered medications on file prior to visit.    PAST MEDICAL HISTORY: Past Medical History:  Diagnosis Date   Arthritis    "all over"   CHF (congestive heart failure) (HCC)    Chronic lower back pain    Coronary artery disease    Early cataracts, bilateral    Family history of adverse reaction to anesthesia    "son had ligament repair of elbow in 2016; woke up nauseated and disoriented"   Hypercholesterolemia    Hypertension    Migraine    "none in years" (10/12/2015)   Restless leg syndrome    Wears glasses     PAST SURGICAL HISTORY: Past Surgical History:  Procedure Laterality Date   ANTERIOR CERVICAL DECOMP/DISCECTOMY FUSION  ~ 2005   ANTERIOR CERVICAL DISCECTOMY     BACK SURGERY     CARDIAC CATHETERIZATION N/A 10/12/2015   Procedure: Left Heart Cath and Coronary Angiography;  Surgeon: Adrian Prows, MD;  Location: [place] [facility];  Service: Cardiovascular;  Laterality: N/A;   CARDIAC CATHETERIZATION  10/12/2015   Procedure: Coronary Stent Intervention;  Surgeon: Adrian Prows, MD;  Location: [place] [facility];  Service: Cardiovascular;;   COLONOSCOPY     CORONARY ANGIOPLASTY  10/12/2015   DES RCA   CYSTOSCOPY WITH INSERTION OF UROLIFT N/A 05/02/2021   Procedure: CYSTOSCOPY WITH INSERTION OF UROLIFT;  Surgeon: Cleon Gustin, MD;  Location: [facility];  Service: Urology;  Laterality: N/A;   EYE SURGERY Left    cataract   FRACTURE SURGERY     nose repaired from accident   GLAUCOMA SURGERY Left 03/04/2015   JOINT REPLACEMENT     KNEE ARTHROSCOPY Bilateral    LEFT HEART CATH AND CORONARY ANGIOGRAPHY N/A 01/17/2022   Procedure: LEFT HEART CATH AND CORONARY ANGIOGRAPHY;  Surgeon: Adrian Prows, MD;  Location: [place] [facility];  Service: Cardiovascular;  Laterality: N/A;   LUMBAR DISC SURGERY  ~ 1986-02/08/2007 X 6   MAXIMUM ACCESS  (MAS)POSTERIOR LUMBAR INTERBODY FUSION (PLIF) 3 LEVEL  02/08/2007   w/rods and screws   SPINAL CORD STIMULATOR INSERTION N/A 05/08/2014   Procedure: LUMBAR SPINAL CORD STIMULATOR IMPLANT ;  Surgeon: Bonna Gains, MD;  Location: [facility];  Service: Neurosurgery;  Laterality: N/A;   TOE FUSION Right 06/02/2014   "outside part of big toe"   TOTAL KNEE ARTHROPLASTY Left ~ 2011    FAMILY HISTORY: The patient's family history includes Aneurysm in his mother; Cancer in an other family member; Heart attack in his father; Hypertension in his father.   SOCIAL HISTORY:  The patient  reports that he has never smoked. He has never used smokeless tobacco. He reports that he does not currently use alcohol  after a past usage of about 3.0 standard drinks of alcohol per week. He reports that he does not use drugs.  Review of Systems  Cardiovascular:  Positive for dyspnea on exertion (better). Negative for chest pain, leg swelling, near-syncope, orthopnea, palpitations, paroxysmal nocturnal dyspnea and syncope.  Respiratory:  Positive for shortness of breath (better).     PHYSICAL EXAM:    01/27/2022    9:42 AM 01/17/2022    6:00 PM 01/17/2022    5:29 PM  Vitals with BMI  Height 5\' 9"     Weight 195 lbs    BMI 28.78    Systolic 123 132  Diastolic 74 72 57  Pulse 100 94 95    CONSTITUTIONAL: Well-developed and well-nourished. No acute distress.  SKIN: Skin is warm and dry. No rash noted. No cyanosis. No pallor. No jaundice HEAD: Normocephalic and atraumatic.  EYES: No scleral icterus MOUTH/THROAT: Moist oral membranes.  NECK: no JVD present. No thyromegaly noted. CHEST Normal respiratory effort. No intercostal retractions  LUNGS: Clear to auscultation bilaterally.  No stridor. No wheezes. No rales.  CARDIOVASCULAR: Tachycardic, regular rate and rhythm, positive S1-S2, no murmurs rubs or gallops appreciated. ABDOMINAL: Soft, nontender, nondistended, positive bowel sounds in all 4  quadrants.  No apparent ascites.  EXTREMITIES: No bilateral pitting edema. warm to touch bilaterally.  With 2+ dorsalis pedis and posterior tibial pulses. HEMATOLOGIC: No significant bruising NEUROLOGIC: Oriented to person, place, and time. Nonfocal. Normal muscle tone.  PSYCHIATRIC: Normal mood and affect. Normal behavior. Cooperative  CARDIAC DATABASE: EKG: October 25, 2021: Normal sinus rhythm, 86 bpm, occasional PACs, without underlying injury pattern.  Echocardiogram: 01/19/2020: LVEF 45-50%, mild global hypokinesis, moderate LVH, grade 1 diastolic impairment, normal LAP, moderately dilated left atrium, mild AR, moderate MR.  12/28/2021:  1. Left ventricular ejection fraction, by estimation, is 40 to 45%. The  left ventricle has mildly decreased function. The left ventricle  demonstrates regional wall motion abnormalities (The entire septum is hypokinetic). There is mild left  ventricular hypertrophy. Left ventricular diastolic parameters are  consistent with Grade II diastolic dysfunction (pseudonormalization).  Elevated left atrial pressure. The E/e' is 19.5. The average left  ventricular global longitudinal strain is -11.6 %.  The global longitudinal strain is abnormal.   2. Right ventricular systolic function is normal. The right ventricular  size is normal.   3. Left atrial size was severely dilated.   4. The mitral valve is grossly normal. Mild to moderate mitral valve  regurgitation. No evidence of mitral stenosis.   5. The aortic valve is tricuspid. Aortic valve regurgitation is not  visualized. Aortic valve sclerosis is present, with no evidence of aortic  valve stenosis.   Stress Testing:  Lexiscan Tetrofosmin stress test 01/11/2022: Abnormal nuclear stress test 1 Day Rest/Stress protocol.  Stress EKG is non-diagnostic -target heart rate not achieved. Medium size, mild to moderate intensity reversible perfusion defect involving the basal to distal inferior segments  suggestive of ischemia in the RCA distribution.   Small size, mild to moderate intensity reversible perfusion defect in the basal to mid inferoseptal segments suggestive of ischemia in the RCA distribution. Left ventricular size dilated, calculated LVEF 24%, wall motion severely reduced with global and regional wall motion abnormality. Prior study 01/19/2020 reported normal myocardial perfusion, calculated LVEF 44% but visually appears normal. High risk study.  Clinical correlation required.  Heart Catheterization: Left Heart Catheterization 01/17/22:  LV: 100/14, EDP 14 mmHg.  Ao 114/53, mean 76 mm Hg. No pressure gradient across  the aortic valve. LM: Large caliber vessel, it trifurcates. LAD: Large caliber vessel giving origin to a very large D1.  There is mild diffuse coronary calcification noted in the proximal and mid segment.  At the origin of the D2 scratch that at the origin of D1, there is mild calcific 10 to 20% stenosis.  Otherwise the LAD has minimal disease. CX: Moderate caliber vessel giving origin to a moderate-sized OM1, smooth and normal with minimal disease. RI: Moderate caliber vessel.  Has a secondary branch.  Has no significant disease.  Minimal luminal abnormality noted. RCA: Large-caliber vessel.  Proximal right coronary artery stent, placed 10/12/2015:  4.0 x 18 mm resolute DES widely patent.  There is mild luminal irregularity in the mid RCA and distal RCA.   Vascular imaging: Abdominal aortic duplex 05/30/2017: Focal plaque noted in the proximal, mid and distal aorta. The maximum aorta diameter is 2.82 cm (dist). No AAA. Normal iliac velocity.  LABORATORY DATA:    Latest Ref Rng & Units 01/16/2022    2:47 PM 06/06/2020    1:02 PM 04/29/2014   11:57 AM  CBC  WBC 3.4 - 10.8 x10E3/uL 5.0  11.1  6.5   Hemoglobin 13.0 - 17.7 g/dL 11.8  14.0  13.0   Hematocrit 37.5 - 51.0 % 35.1  43.4  38.4   Platelets 150 - 450 x10E3/uL 211  217  210        Latest Ref Rng & Units  01/16/2022    2:48 PM 12/20/2021    1:55 PM 03/29/2021   12:08 PM  CMP  Glucose 70 - 99 mg/dL 111  109  98   BUN 8 - 27 mg/dL 13  10  12    Creatinine 0.76 - 1.27 mg/dL 1.03  0.79  0.85   Sodium 134 - 144 mmol/L 140  139  141   Potassium 3.5 - 5.2 mmol/L 4.5  4.5  4.2   Chloride 96 - 106 mmol/L 101  101  105   CO2 20 - 29 mmol/L 28  24  26    Calcium 8.6 - 10.2 mg/dL 9.3  9.2  9.3     Lipid Panel  No results found for: "CHOL", "TRIG", "HDL", "CHOLHDL", "VLDL", "LDLCALC", "LDLDIRECT", "LABVLDL"  Lab Results  Component Value Date   HGBA1C  04/20/2010    5.6 (NOTE)                                                                       According to the ADA Clinical Practice Recommendations for 2011, when HbA1c is used as a screening test:   >=6.5%   Diagnostic of Diabetes Mellitus           (if abnormal result  is confirmed)  5.7-6.4%   Increased risk of developing Diabetes Mellitus  References:Diagnosis and Classification of Diabetes Mellitus,Diabetes [id number] 1):S62-S69 and Standards of Medical Care in         Diabetes - 2011,Diabetes Care,2011,34  (Suppl 1):S11-S61.   No components found for: "NTPROBNP" No results found for: "TSH"  Cardiac Panel (last 3 results) No results for input(s): "CKTOTAL", "CKMB", "TROPONINIHS", "RELINDX" in the last 72 hours.  IMPRESSION:    ICD-10-CM   1. Acute combined systolic and diastolic heart  failure ([place])  I50.41 Ambulatory referral to Cardiology    empagliflozin (JARDIANCE) 10 MG TABS tablet    furosemide (LASIX) 20 MG tablet    Pro b natriuretic peptide (BNP)    Basic metabolic panel    Magnesium    2. Ischemic cardiomyopathy  I25.5     3. Atherosclerosis of native coronary artery of native heart without angina pectoris  I25.10     4. Status post PCI to the RCA  Z95.5     5. Hypercholesterolemia  E78.00     6. Essential hypertension  I10        RECOMMENDATIONS: Brian Gallegos is a 76 y.o. male whose past medical history  and cardiovascular risk factors include: Established coronary artery disease without angina pectoris, status post stenting in the RCA in April 2017, hypertension, hyperlipidemia, prediabetes, advanced age.  Acute combined systolic and diastolic heart failure (HCC) Improving. Stage C, NYHA class II/III. Symptoms started May/June 2023. NT-proBNP elevated in recent past compared to prior reading; however, overall improving.  Reviewed the results of Echo, MPI, and LHC w/ patient and his daughter.  Tachyardiac at baseline - however, he is not sure if  he is taking coreg 25mg  po bid. I have asked him to take it 25mg  po bid and if his pulse is still around 90-100 would recommend Corlanor.  Will start jardiance 10mg  po qday (Farxiga not on formulary under his Part D per EMR).  Since adding Jardiance will reduce Lasix to 20mg  po qAM.  Labs in one week to re-evaluate BMP, NTproBNP, and Mg.  Unable to tolerate Bumex and Isordil secondary to migraines/myalgias. Consult placed for Dr.Mclean at patient's request for CHF.   Ischemic cardiomyopathy Echo 12/2021: LVEF 45% with regional wall motion abnormalities, abnormal LV strain, and grade 2 diastolic dysfunction. MPI 12/2021: results reviewed w/ patient and his daughter.  LHC 12/2021: No new obstructive disease and prior stents patent.  See above.   Atherosclerosis of native coronary artery of native heart without angina pectoris /status post PCI to the RCA Denies angina pectoris. No use of sublingual nitroglycerin tablets. See above  Hypercholesterolemia Currently on Crestor. Monitor lipid profile and LFTs.   Essential hypertension Home and office blood pressures are well controlled. Medications reconciled.  FINAL MEDICATION LIST END OF ENCOUNTER: Meds ordered this encounter  Medications   empagliflozin (JARDIANCE) 10 MG TABS tablet    Sig: Take 1 tablet (10 mg total) by mouth every morning.    Dispense:  90 tablet    Refill:  0    furosemide (LASIX) 20 MG tablet    Sig: Take 1 tablet (20 mg total) by mouth every morning.    Dispense:  90 tablet    Refill:  0     Medications Discontinued During This Encounter  Medication Reason   furosemide (LASIX) 40 MG tablet Dose change      Current Outpatient Medications:    amitriptyline (ELAVIL) 50 MG tablet, Take 50 mg by mouth at bedtime., Disp: , Rfl:    aspirin EC 81 MG tablet, Take 81 mg by mouth daily., Disp: , Rfl:    carvedilol (COREG) 25 MG tablet, Take 1 tablet (25 mg total) by mouth 2 (two) times daily., Disp: 60 tablet, Rfl: 0   Cholecalciferol 25 MCG (1000 UT) tablet, Take 1,000 Units by mouth daily., Disp: , Rfl:    doxazosin (CARDURA) 4 MG tablet, Take 4 mg by mouth every morning., Disp: , Rfl:    DULoxetine (CYMBALTA) 20  MG capsule, TAKE 1 CAPSULE(20 MG) BY MOUTH DAILY, Disp: 30 capsule, Rfl: 3   empagliflozin (JARDIANCE) 10 MG TABS tablet, Take 1 tablet (10 mg total) by mouth every morning., Disp: 90 tablet, Rfl: 0   ENTRESTO 97-103 MG, TAKE 1 TABLET BY MOUTH TWICE DAILY, Disp: 60 tablet, Rfl: 3   furosemide (LASIX) 20 MG tablet, Take 1 tablet (20 mg total) by mouth every morning., Disp: 90 tablet, Rfl: 0   hydrALAZINE (APRESOLINE) 50 MG tablet, TAKE 1 TABLET(50 MG) BY MOUTH THREE TIMES DAILY (Patient taking differently: Take 50 mg by mouth 2 (two) times daily.), Disp: 90 tablet, Rfl: 2   lubiprostone (AMITIZA) 8 MCG capsule, Take 8 mcg by mouth 2 (two) times daily with a meal., Disp: , Rfl:    methocarbamol (ROBAXIN) 750 MG tablet, Take 750 mg by mouth at bedtime., Disp: , Rfl:    nitroGLYCERIN (NITROSTAT) 0.4 MG SL tablet, Place 1 tablet (0.4 mg total) under the tongue every 5 (five) minutes as needed for chest pain. If you require more than two tablets five minutes apart go to the nearest ER via EMS., Disp: 30 tablet, Rfl: 0   potassium chloride (KLOR-CON) 10 MEQ tablet, TAKE 1 TABLET BY MOUTH DAILY (Patient taking differently: Take 10 mEq by mouth once.),  Disp: 30 tablet, Rfl: 6   pyridoxine (B-6) 100 MG tablet, Take 100 mg by mouth daily., Disp: , Rfl:    rOPINIRole (REQUIP) 0.5 MG tablet, Take 1.5 mg by mouth at bedtime., Disp: , Rfl:    rosuvastatin (CRESTOR) 20 MG tablet, Take 20 mg by mouth at bedtime., Disp: , Rfl:    spironolactone (ALDACTONE) 25 MG tablet, TAKE 1/2 TABLET(12.5 MG) BY MOUTH DAILY, Disp: 45 tablet, Rfl: 2   tadalafil (CIALIS) 20 MG tablet, Take 20 mg by mouth daily as needed for erectile dysfunction., Disp: , Rfl:    tamsulosin (FLOMAX) 0.4 MG CAPS capsule, Take 1 capsule (0.4 mg total) by mouth in the morning and at bedtime., Disp: 60 capsule, Rfl: 11  Orders Placed This Encounter  Procedures   Pro b natriuretic peptide (BNP)   Basic metabolic panel   Magnesium   Ambulatory referral to Cardiology    --Continue cardiac medications as reconciled in final medication list. --Return in about 3 months (around 04/29/2022) for Follow up, heart failure management.. Or sooner if needed. --Continue follow-up with your primary care physician regarding the management of your other chronic comorbid conditions.  Patient's questions and concerns were addressed to his satisfaction. He voices understanding of the instructions provided during this encounter.   This note was created using a voice recognition software as a result there may be grammatical errors inadvertently enclosed that do not reflect the nature of this encounter. Every attempt is made to correct such errors.   Rex Kras, [place], [facility]  Pager: [phone] Office: [phone]

## 2022-01-30 ENCOUNTER — Ambulatory Visit: Payer: Medicare PPO | Admitting: Urology

## 2022-01-30 DIAGNOSIS — R0601 Orthopnea: Secondary | ICD-10-CM | POA: Diagnosis not present

## 2022-01-30 DIAGNOSIS — R0602 Shortness of breath: Secondary | ICD-10-CM | POA: Diagnosis not present

## 2022-01-31 ENCOUNTER — Ambulatory Visit: Payer: Medicare PPO | Admitting: Urology

## 2022-01-31 ENCOUNTER — Encounter: Payer: Self-pay | Admitting: Urology

## 2022-01-31 VITALS — BP 129/81 | HR 101

## 2022-01-31 DIAGNOSIS — N138 Other obstructive and reflux uropathy: Secondary | ICD-10-CM | POA: Diagnosis not present

## 2022-01-31 DIAGNOSIS — R339 Retention of urine, unspecified: Secondary | ICD-10-CM | POA: Diagnosis not present

## 2022-01-31 DIAGNOSIS — N401 Enlarged prostate with lower urinary tract symptoms: Secondary | ICD-10-CM | POA: Diagnosis not present

## 2022-01-31 LAB — URINALYSIS, ROUTINE W REFLEX MICROSCOPIC
Bilirubin, UA: NEGATIVE
Glucose, UA: NEGATIVE
Ketones, UA: NEGATIVE
Leukocytes,UA: NEGATIVE
Nitrite, UA: NEGATIVE
Protein,UA: NEGATIVE
RBC, UA: NEGATIVE
Specific Gravity, UA: <1.005 — ABNORMAL LOW (ref 1.005–1.030)
Urobilinogen, Ur: 0.2 mg/dL (ref 0.2–1.0)
pH, UA: 5.5 (ref 5.0–7.5)

## 2022-01-31 LAB — BLADDER SCAN AMB NON-IMAGING: Scan Result: 69

## 2022-01-31 NOTE — Progress Notes (Signed)
01/31/2022 10:26 AM   Brian Gallegos October 09, 1945 [id number]  Referring provider: Sigmund Hazel, MD [address] [place],  [place] [address]  No chief complaint on file.   HPI: Brian Gallegos is a 75yo here for followup for BPH and Erectile dysfunction. He is urinating well after Urolift. IPSS 5 QOl 1 after urolift. PVR 69cc. He tried trimix which have him an erection that did not last. He has caridac issues and is currently seeing cardiology and pulmonology. He was advised not to attempt sexual intercourse.    PMH: Past Medical History:  Diagnosis Date   Arthritis    "all over"   CHF (congestive heart failure) (HCC)    Chronic lower back pain    Coronary artery disease    Early cataracts, bilateral    Family history of adverse reaction to anesthesia    "son had ligament repair of elbow in 2016; woke up nauseated and disoriented"   Hypercholesterolemia    Hypertension    Migraine    "none in years" (10/12/2015)   Restless leg syndrome    Wears glasses     Surgical History: Past Surgical History:  Procedure Laterality Date   ANTERIOR CERVICAL DECOMP/DISCECTOMY FUSION  ~ 2005   ANTERIOR CERVICAL DISCECTOMY     BACK SURGERY     CARDIAC CATHETERIZATION N/A 10/12/2015   Procedure: Left Heart Cath and Coronary Angiography;  Surgeon: Brian Decamp, MD;  Location: [facility] INVASIVE CV LAB;  Service: Cardiovascular;  Laterality: N/A;   CARDIAC CATHETERIZATION  10/12/2015   Procedure: Coronary Stent Intervention;  Surgeon: Brian Decamp, MD;  Location: [facility] INVASIVE CV LAB;  Service: Cardiovascular;;   COLONOSCOPY     CORONARY ANGIOPLASTY  10/12/2015   DES RCA   CYSTOSCOPY WITH INSERTION OF UROLIFT N/A 05/02/2021   Procedure: CYSTOSCOPY WITH INSERTION OF UROLIFT;  Surgeon: Brian Gauze, MD;  Location: [facility] OR[facility];  Service: Urology;  Laterality: N/A;   EYE SURGERY Left    cataract   FRACTURE SURGERY     nose repaired from accident   GLAUCOMA SURGERY Left 03/04/2015   JOINT REPLACEMENT      KNEE ARTHROSCOPY Bilateral    LEFT HEART CATH AND CORONARY ANGIOGRAPHY N/A 01/17/2022   Procedure: LEFT HEART CATH AND CORONARY ANGIOGRAPHY;  Surgeon: Brian Decamp, MD;  Location: [facility] INVASIVE CV LAB;  Service: Cardiovascular;  Laterality: N/A;   LUMBAR DISC SURGERY  ~ 1986-02/08/2007 X 6   MAXIMUM ACCESS (MAS)POSTERIOR LUMBAR INTERBODY FUSION (PLIF) 3 LEVEL  02/08/2007   w/rods and screws   SPINAL CORD STIMULATOR INSERTION N/A 05/08/2014   Procedure: LUMBAR SPINAL CORD STIMULATOR IMPLANT ;  Surgeon: Brian Edinger, MD;  Location: [facility] NEURO ORS;  Service: Neurosurgery;  Laterality: N/A;   TOE FUSION Right 06/02/2014   "outside part of big toe"   TOTAL KNEE ARTHROPLASTY Left ~ 2011    Home Medications:  Allergies as of 01/31/2022       Reactions   Bisacodyl Other (See Comments)   Severe stomach cramps   Codeine Itching   Low amount is tolerated   Finasteride Hives, Rash        Medication List        Accurate as of January 31, 2022 10:26 AM. If you have any questions, ask your nurse or doctor.          amitriptyline 50 MG tablet Commonly known as: ELAVIL Take 50 mg by mouth at bedtime.   aspirin EC 81 MG tablet Take 81  mg by mouth daily.   carvedilol 25 MG tablet Commonly known as: COREG Take 1 tablet (25 mg total) by mouth 2 (two) times daily.   Cholecalciferol 25 MCG (1000 UT) tablet Take 1,000 Units by mouth daily.   doxazosin 4 MG tablet Commonly known as: CARDURA Take 4 mg by mouth every morning.   DULoxetine 20 MG capsule Commonly known as: CYMBALTA TAKE 1 CAPSULE(20 MG) BY MOUTH DAILY   empagliflozin 10 MG Tabs tablet Commonly known as: Jardiance Take 1 tablet (10 mg total) by mouth every morning.   Entresto 97-103 MG Generic drug: sacubitril-valsartan TAKE 1 TABLET BY MOUTH TWICE DAILY   furosemide 20 MG tablet Commonly known as: Lasix Take 1 tablet (20 mg total) by mouth every morning.   hydrALAZINE 50 MG tablet Commonly known as:  APRESOLINE TAKE 1 TABLET(50 MG) BY MOUTH THREE TIMES DAILY What changed: See the new instructions.   lubiprostone 8 MCG capsule Commonly known as: AMITIZA Take 8 mcg by mouth 2 (two) times daily with a meal.   methocarbamol 750 MG tablet Commonly known as: ROBAXIN Take 750 mg by mouth at bedtime.   nitroGLYCERIN 0.4 MG SL tablet Commonly known as: Nitrostat Place 1 tablet (0.4 mg total) under the tongue every 5 (five) minutes as needed for chest pain. If you require more than two tablets five minutes apart go to the nearest ER via EMS.   potassium chloride 10 MEQ tablet Commonly known as: KLOR-CON TAKE 1 TABLET BY MOUTH DAILY What changed: when to take this   pyridoxine 100 MG tablet Commonly known as: B-6 Take 100 mg by mouth daily.   rOPINIRole 0.5 MG tablet Commonly known as: REQUIP Take 1.5 mg by mouth at bedtime.   rosuvastatin 20 MG tablet Commonly known as: CRESTOR Take 20 mg by mouth at bedtime.   spironolactone 25 MG tablet Commonly known as: ALDACTONE TAKE 1/2 TABLET(12.5 MG) BY MOUTH DAILY   tadalafil 20 MG tablet Commonly known as: CIALIS Take 20 mg by mouth daily as needed for erectile dysfunction.   tamsulosin 0.4 MG Caps capsule Commonly known as: FLOMAX Take 1 capsule (0.4 mg total) by mouth in the morning and at bedtime.        Allergies:  Allergies  Allergen Reactions   Bisacodyl Other (See Comments)    Severe stomach cramps   Codeine Itching    Low amount is tolerated   Finasteride Hives and Rash    Family History: Family History  Problem Relation Age of Onset   Aneurysm Mother    Heart attack Father    Hypertension Father    Cancer Other     Social History:  reports that he has never smoked. He has never used smokeless tobacco. He reports that he does not currently use alcohol after a past usage of about 3.0 standard drinks of alcohol per week. He reports that he does not use drugs.  ROS: All other review of systems were  reviewed and are negative except what is noted above in HPI  Physical Exam: BP 129/81   Pulse (!) 101   Constitutional:  Alert and oriented, No acute distress. HEENT: Brian Gallegos AT, moist mucus membranes.  Trachea midline, no masses. Cardiovascular: No clubbing, cyanosis, or edema. Respiratory: Normal respiratory effort, no increased work of breathing. GI: Abdomen is soft, nontender, nondistended, no abdominal masses GU: No CVA tenderness.  Lymph: No cervical or inguinal lymphadenopathy. Skin: No rashes, bruises or suspicious lesions. Neurologic: Grossly intact, no focal deficits,  moving all 4 extremities. Psychiatric: Normal mood and affect.  Laboratory Data: Lab Results  Component Value Date   WBC 5.0 01/16/2022   HGB 11.8 (L) 01/16/2022   HCT 35.1 (L) 01/16/2022   MCV 93 01/16/2022   PLT 211 01/16/2022    Lab Results  Component Value Date   CREATININE 1.03 01/16/2022    No results found for: "PSA"  Lab Results  Component Value Date   TESTOSTERONE 611 06/15/2020    Lab Results  Component Value Date   HGBA1C  04/20/2010    5.6 (NOTE)                                                                       According to the ADA Clinical Practice Recommendations for 2011, when HbA1c is used as a screening test:   >=6.5%   Diagnostic of Diabetes Mellitus           (if abnormal result  is confirmed)  5.7-6.4%   Increased risk of developing Diabetes Mellitus  References:Diagnosis and Classification of Diabetes Mellitus,Diabetes Care,2011,34(Suppl 1):S62-S69 and Standards of Medical Care in         Diabetes - 2011,Diabetes Care,2011,34  (Suppl 1):S11-S61.    Urinalysis    Component Value Date/Time   COLORURINE YELLOW 06/06/2020 1403   APPEARANCEUR Clear 04/19/2021 1524   LABSPEC 1.009 06/06/2020 1403   PHURINE 7.0 06/06/2020 1403   GLUCOSEU Negative 04/19/2021 1524   HGBUR MODERATE (A) 06/06/2020 1403   BILIRUBINUR Negative 04/19/2021 1524   KETONESUR NEGATIVE 06/06/2020 1403    PROTEINUR Negative 04/19/2021 1524   PROTEINUR 30 (A) 06/06/2020 1403   UROBILINOGEN 0.2 04/21/2010 1203   NITRITE Negative 04/19/2021 1524   NITRITE NEGATIVE 06/06/2020 1403   LEUKOCYTESUR Negative 04/19/2021 1524   LEUKOCYTESUR SMALL (A) 06/06/2020 1403    Lab Results  Component Value Date   LABMICR Comment 04/19/2021   WBCUA 11-30 (A) 01/28/2021   LABEPIT 0-10 01/28/2021   MUCUS Present 01/28/2021   BACTERIA Many (A) 01/28/2021    Pertinent Imaging:  No results found for this or any previous visit.  No results found for this or any previous visit.  No results found for this or any previous visit.  No results found for this or any previous visit.  No results found for this or any previous visit.  No results found for this or any previous visit.  No results found for this or any previous visit.  No results found for this or any previous visit.   Assessment & Plan:    1. Urinary retention -resolved - Urinalysis, Routine w reflex microscopic - BLADDER SCAN AMB NON-IMAGING  2. Benign prostatic hyperplasia with urinary obstruction -improved after urolift  3. Erectile dysfunction -We will await cardiac and pulmonary clearance   No follow-ups on file.  Wilkie Aye, MD  [facility] [place]

## 2022-01-31 NOTE — Patient Instructions (Signed)

## 2022-01-31 NOTE — Progress Notes (Signed)
post void residual=69 

## 2022-02-03 ENCOUNTER — Telehealth: Payer: Self-pay | Admitting: Cardiology

## 2022-02-03 NOTE — Telephone Encounter (Signed)
Returned his phone call regarding him having shortness of breath, coughing, and no appetite.   Reached his voicemail. Requested him to call us back.   Delilah Shan [facility]  Pager: [phone] Office: [phone] 02/03/22 5:34 PM

## 2022-02-07 NOTE — Telephone Encounter (Signed)
I was informed today that patient has being short of breath I called him after clinic and reached his voicemail.   I left a voicemail for him to call us back with a call back number.   Will try again.   Tessa Lerner, [place], [facility]  Pager: [phone] Office: [phone]

## 2022-02-08 ENCOUNTER — Ambulatory Visit (HOSPITAL_COMMUNITY)
Admission: RE | Admit: 2022-02-08 | Discharge: 2022-02-08 | Disposition: A | Discharge status: Home / Self Care | Payer: Medicare PPO | Source: Ambulatory Visit | Attending: Cardiology | Admitting: Cardiology

## 2022-02-08 ENCOUNTER — Encounter (HOSPITAL_COMMUNITY): Payer: Self-pay | Admitting: Cardiology

## 2022-02-08 ENCOUNTER — Other Ambulatory Visit (HOSPITAL_COMMUNITY): Payer: Self-pay

## 2022-02-08 VITALS — BP 120/70 | HR 94 | Wt 191.2 lb

## 2022-02-08 DIAGNOSIS — I2510 Atherosclerotic heart disease of native coronary artery without angina pectoris: Secondary | ICD-10-CM | POA: Diagnosis not present

## 2022-02-08 DIAGNOSIS — Z8249 Family history of ischemic heart disease and other diseases of the circulatory system: Secondary | ICD-10-CM | POA: Diagnosis not present

## 2022-02-08 DIAGNOSIS — R053 Chronic cough: Secondary | ICD-10-CM | POA: Insufficient documentation

## 2022-02-08 DIAGNOSIS — Z7982 Long term (current) use of aspirin: Secondary | ICD-10-CM | POA: Diagnosis not present

## 2022-02-08 DIAGNOSIS — I110 Hypertensive heart disease with heart failure: Secondary | ICD-10-CM | POA: Diagnosis not present

## 2022-02-08 DIAGNOSIS — E785 Hyperlipidemia, unspecified: Secondary | ICD-10-CM | POA: Insufficient documentation

## 2022-02-08 DIAGNOSIS — Z79899 Other long term (current) drug therapy: Secondary | ICD-10-CM | POA: Diagnosis not present

## 2022-02-08 DIAGNOSIS — Z955 Presence of coronary angioplasty implant and graft: Secondary | ICD-10-CM | POA: Diagnosis not present

## 2022-02-08 DIAGNOSIS — I5042 Chronic combined systolic (congestive) and diastolic (congestive) heart failure: Secondary | ICD-10-CM | POA: Insufficient documentation

## 2022-02-08 DIAGNOSIS — J984 Other disorders of lung: Secondary | ICD-10-CM | POA: Insufficient documentation

## 2022-02-08 DIAGNOSIS — I5041 Acute combined systolic (congestive) and diastolic (congestive) heart failure: Secondary | ICD-10-CM

## 2022-02-08 DIAGNOSIS — R0602 Shortness of breath: Secondary | ICD-10-CM | POA: Insufficient documentation

## 2022-02-08 DIAGNOSIS — I255 Ischemic cardiomyopathy: Secondary | ICD-10-CM | POA: Insufficient documentation

## 2022-02-08 LAB — COMPREHENSIVE METABOLIC PANEL
ALT: 14 U/L (ref 0–44)
AST: 21 U/L (ref 15–41)
Albumin: 4 g/dL (ref 3.5–5.0)
Alkaline Phosphatase: 82 U/L (ref 38–126)
Anion gap: 9 (ref 5–15)
BUN: 11 mg/dL (ref 8–23)
CO2: 25 mmol/L (ref 22–32)
Calcium: 9.4 mg/dL (ref 8.9–10.3)
Chloride: 103 mmol/L (ref 98–111)
Creatinine, Ser: 1.16 mg/dL (ref 0.61–1.24)
GFR, Estimated: >60 mL/min (ref >60)
Glucose, Bld: 101 mg/dL — ABNORMAL HIGH (ref 70–99)
Potassium: 3.9 mmol/L (ref 3.5–5.1)
Sodium: 137 mmol/L (ref 135–145)
Total Bilirubin: 1 mg/dL (ref 0.3–1.2)
Total Protein: 7.3 g/dL (ref 6.5–8.1)

## 2022-02-08 LAB — CBC
HCT: 35.9 % — ABNORMAL LOW (ref 39.0–52.0)
Hemoglobin: 12.1 g/dL — ABNORMAL LOW (ref 13.0–17.0)
MCH: 32.2 pg (ref 26.0–34.0)
MCHC: 33.7 g/dL (ref 30.0–36.0)
MCV: 95.5 fL (ref 80.0–100.0)
Platelets: 170 10*3/uL (ref 150–400)
RBC: 3.76 MIL/uL — ABNORMAL LOW (ref 4.22–5.81)
RDW: 14.3 % (ref 11.5–15.5)
WBC: 6.7 10*3/uL (ref 4.0–10.5)
nRBC: 0 % (ref 0.0–0.2)

## 2022-02-08 LAB — TSH: TSH: 2.234 u[IU]/mL (ref 0.350–4.500)

## 2022-02-08 LAB — BRAIN NATRIURETIC PEPTIDE: B Natriuretic Peptide: 951.2 pg/mL — ABNORMAL HIGH (ref 0.0–100.0)

## 2022-02-08 MED ORDER — FUROSEMIDE 40 MG PO TABS: 40.0000 mg | ORAL_TABLET | Freq: Two times a day (BID) | ORAL | 3 refills | Status: DC

## 2022-02-08 MED ORDER — POTASSIUM CHLORIDE ER 10 MEQ PO TBCR: 20.0000 meq | EXTENDED_RELEASE_TABLET | Freq: Every day | ORAL | 3 refills | Status: DC

## 2022-02-08 NOTE — Progress Notes (Signed)
ReDS Vest / Clip - 02/08/22 1200       ReDS Vest / Clip   Station Marker C    Ruler Value 28.5    ReDS Value Range High volume overload    ReDS Actual Value 47

## 2022-02-08 NOTE — Patient Instructions (Addendum)
Increase lasix to 40mg  Twice daily  Increase potassium to daily  Labs done today, your results will be available in MyChart, we will contact you for abnormal readings.   You are scheduled for a Cardiac Catheterization on Tuesday, August 22 with Dr. August 24.  1. Please arrive at the Main Entrance A at [facility]: [address] [place], [place] at 11:00 AM (This time is two hours before your procedure to ensure your preparation). Free valet parking service is available.   Special note: Every effort is made to have your procedure done on time. Please understand that emergencies sometimes delay scheduled procedures.  2. Diet: Do not eat solid foods after midnight.  You may have clear liquids until 5 AM upon the day of the procedure.  3. Medication instructions in preparation for your procedure:   Contrast Allergy: No  Stop taking, lasix and Spironolactone  Tuesday, August 22,  On the morning of your procedure, take  any morning medicines NOT listed above.  You may use sips of water.  5. Plan to go home the same day, you will only stay overnight if medically necessary. 6. You MUST have a responsible adult to drive you home. 7. An adult MUST be with you the first 24 hours after you arrive home. 8. Bring a current list of your medications, and the last time and date medication taken. 9. Bring ID and current insurance cards. 10.Please wear clothes that are easy to get on and off and wear slip-on shoes.  Thank you for allowing 08-28-1994 to care for you!   -- [facility]  Your physician has recommended that you have a cardiopulmonary stress test (CPX). CPX testing is a non-invasive measurement of heart and lung function. It replaces a traditional treadmill stress test. This type of test provides a tremendous amount of information that relates not only to your present condition but also for future outcomes. This test combines  measurements of you ventilation, respiratory gas exchange in the lungs, electrocardiogram (EKG), blood pressure and physical response before, during, and following an exercise protocol.  Your physician has requested that you have a cardiac MRI. Cardiac MRI uses a computer to create images of your heart as its beating, producing both still and moving pictures of your heart and major blood vessels. For further information please visit [place]. Please follow the instruction sheet given to you today for more information.  ONCE APPROVED BY YOUR INSURANCE COMPANY YOU WILL BE CALLED TO SCHEDULE THE TEST.  Your physician recommends that you schedule a follow-up appointment in: 3-4 weeks   If you have any questions or concerns before your next appointment please send [email or web address] a message through [place] or call our office at [phone].    TO LEAVE A MESSAGE FOR THE NURSE SELECT OPTION 2, PLEASE LEAVE A MESSAGE INCLUDING: YOUR NAME DATE OF BIRTH CALL BACK NUMBER REASON FOR CALL**this is important as we prioritize the call backs  YOU WILL RECEIVE A CALL BACK THE SAME DAY AS LONG AS YOU CALL BEFORE 4:00 PM  At the [facility], you and your health needs are our priority. As part of our continuing mission to provide you with exceptional heart care, we have created designated Provider Care Teams. These Care Teams include your primary Cardiologist (physician) and Advanced Practice Providers (APPs- Physician Assistants and Nurse Practitioners) who all work together to provide you with the care you need, when you need it.   You  may see any of the following providers on your designated Care Team at your next follow up: Dr Arvilla Meres Dr Carron Curie, NP Robbie Lis, [place] [facility], [place] Karle Plumber, PharmD   Please be sure to bring in all your medications bottles to every appointment.

## 2022-02-08 NOTE — H&P (View-Only) (Signed)
PCP: Sigmund Hazel, MD HF Cardiology: Dr. Shirlee Latch  76 y.o. with history of HTN, hyperlipidemia, and CAD was referred by Dr. Odis Hollingshead for evaluation of CHF.  Patient had a cath in 4/17 with DES to 90% proximal RCA stenosis.  Echoes after that time showed mildly decreased systolic function in the 45-50% range.  He was generally asymptomatic, however, until around 6/23.  In 6/23, he developed progressive dyspnea and peripheral edema.  Echo in 6/23 showed EF 40-45%, mild LVH, grade 2 diastolic dysfunction, RV normal, severe LAE, mild-moderate MR.  He was started on Bumex which gave him headaches, this was switched to Lasix.  Peripheral edema improved but he has continued to feel short of breath with exertion.  No definite trigger for symptoms, no episode of prolonged chest pain or severe viral-type illness.  He has a chronic cough.  He has poor appetite and has generally been losing weight.  He has orthopnea and keeps the head of the bed raised.  No bendopnea.  No chest pain. No palpitations.  Currently, he is short of breath walking 50 feet or walking up stairs.  He had coronary angiography in 7/23, showing patent RCA stent and nonobstructive mild CAD. He drank heavily in the past but not in the last 5 years (3-4 glasses wine/week now). He has a family history of CAD (father at around 61) but not cardiomyopathy or sudden cardiac death.    ECG (personally reviewed): NSR, IVCD 130 msec  REDS clip 47%  Labs (6/23): pro-BNP 2381 Labs (7/23): K 4.5, creatinine 1.03, pro-BNP 1455  PMH: 1. HTN 2. Hyperlipidemia 3. CAD: LHC in 4/17 with 90% pRCA treated with DES.  - LHC (7/23): Patent RCA stent, mild nonobstructive disease.  4. Chronic HF with mid range EF: ?Ischemic cardiomyopathy.  - Echo (3/18): EF 50%.  - Echo (7/21): EF 45-50%, septal HK, moderate MR - Echo (6/23): EF 40-45%, mild LVH, grade 2 diastolic dysfunction, RV normal, severe LAE, mild-moderate MR.   SH: Married x 2, now widower.  Has 2 children.   Lives in [place].  Designer, television/film set.  Drinks 3-4 glasses wine/week. Nonsmoker though exposed to 2nd hand smoke.   FH: Father with MI in his 13s, mother with brain aneurysm in her 62s.   Current Outpatient Medications  Medication Sig Dispense Refill   amitriptyline (ELAVIL) 50 MG tablet Take 50 mg by mouth at bedtime.     aspirin EC 81 MG tablet Take 81 mg by mouth daily.     carvedilol (COREG) 25 MG tablet Take 1 tablet (25 mg total) by mouth 2 (two) times daily. 60 tablet 0   Cholecalciferol 25 MCG (1000 UT) tablet Take 1,000 Units by mouth daily.     doxazosin (CARDURA) 4 MG tablet Take 4 mg by mouth every morning.     DULoxetine (CYMBALTA) 20 MG capsule TAKE 1 CAPSULE(20 MG) BY MOUTH DAILY 30 capsule 3   empagliflozin (JARDIANCE) 10 MG TABS tablet Take 1 tablet (10 mg total) by mouth every morning. 90 tablet 0   ENTRESTO 97-103 MG TAKE 1 TABLET BY MOUTH TWICE DAILY 60 tablet 3   hydrALAZINE (APRESOLINE) 50 MG tablet Take 50 mg by mouth 2 (two) times daily.     lubiprostone (AMITIZA) 8 MCG capsule Take 8 mcg by mouth 2 (two) times daily with a meal.     methocarbamol (ROBAXIN) 750 MG tablet Take 750 mg by mouth at bedtime.     nitroGLYCERIN (NITROSTAT) 0.4 MG SL tablet Place 1  tablet (0.4 mg total) under the tongue every 5 (five) minutes as needed for chest pain. If you require more than two tablets five minutes apart go to the nearest ER via EMS. 30 tablet 0   pyridoxine (B-6) 100 MG tablet Take 100 mg by mouth daily.     rOPINIRole (REQUIP) 0.5 MG tablet Take 1.5 mg by mouth at bedtime.     rOPINIRole (REQUIP) 0.5 MG tablet Take 1.5 mg by mouth daily in the afternoon.     rosuvastatin (CRESTOR) 20 MG tablet Take 20 mg by mouth at bedtime.     spironolactone (ALDACTONE) 25 MG tablet TAKE 1/2 TABLET(12.5 MG) BY MOUTH DAILY 45 tablet 2   tadalafil (CIALIS) 20 MG tablet Take 20 mg by mouth daily as needed for erectile dysfunction.     tamsulosin (FLOMAX) 0.4 MG CAPS capsule Take 1  capsule (0.4 mg total) by mouth in the morning and at bedtime. 60 capsule 11   furosemide (LASIX) 40 MG tablet Take 1 tablet (40 mg total) by mouth 2 (two) times daily. 180 tablet 3   potassium chloride (KLOR-CON) 10 MEQ tablet Take 2 tablets (20 mEq total) by mouth daily. 180 tablet 3   No current facility-administered medications for this encounter.   BP 120/70   Pulse 94   Wt 86.7 kg (191 lb 3.2 oz)   SpO2 96%   BMI 28.24 kg/m  General: NAD Neck: JVP 8 cm, no thyromegaly or thyroid nodule.  Lungs: Clear to auscultation bilaterally with normal respiratory effort. CV: Nondisplaced PMI.  Heart regular S1/S2, no S3/S4, no murmur.  Trace ankle edema.  No carotid bruit.  Normal pedal pulses.  Abdomen: Soft, nontender, no hepatosplenomegaly, no distention.  Skin: Intact without lesions or rashes.  Neurologic: Alert and oriented x 3.  Psych: Normal affect. Extremities: No clubbing or cyanosis.  HEENT: Normal.   Assessment/Plan: 1. Chronic HF with mid range EF:  Most recent echo in 6/23 showed EF 40-45%, mild LVH, grade 2 diastolic dysfunction, RV normal, severe LAE, mild-moderate MR.  He has a history of CAD with PCI to proximal RCA in 2017, but I do not think that CAD/ischemic cardiomyopathy is a definite explanation for his cardiomyopathy/CHF.  Coronary angiography in 7/23 showed patent RCA stent and nonobstructive mild CAD.  He cannot identify a definite trigger for the onset of symptoms this summer.  Could he have had an episode of myocarditis? NYHA class III symptoms, but symptoms are much more impressive than his exam.  However, he has had significantly elevated pro-BNP and REDS clip today was 47%, suggesting quite significant volume overload.  - I would like him to get a cardiac MRI to assess for infiltrative disease, etc, as I do not think that CAD explains his worsening HF.  Will get flow evaluation with this to make sure he does not have significant MR (mild-moderate on last echo, do  not hear significant murmur on exam).  - CPX to assess functional capacity, this will give [place] PFTs and identify any component to his symptoms from lung disease.  - I will arrange for RHC to assess filling pressures and PA pressure, as REDS clip suggests more significant volume overload than exam.  We discussed risks/benefits and he agrees to procedure.  - Increase Lasix to 40 mg bid and KCl to 20 daily.  BMET/BNP today and again in 10 days.  - Check CBC and TSH. - Continue current Coreg, Jardiance, Entresto, and spironolactone. Will increase spironolactone to 25 mg  daily at next appt.  2. CAD: 2017 PCI to RCA.  Cath 7/23 with patent RCA stent and nonobstructive mild disease.  This does not explain his symptoms/volume overload.  - Continue ASA 81 daily.  - Continue Crestor 20 mg daily.   Followup with me in 3 wks after studies.   Loralie Champagne 02/08/2022

## 2022-02-08 NOTE — Progress Notes (Signed)
PCP: Sigmund Hazel, MD HF Cardiology: Dr. Shirlee Latch  76 y.o. with history of HTN, hyperlipidemia, and CAD was referred by Dr. Odis Hollingshead for evaluation of CHF.  Patient had a cath in 4/17 with DES to 90% proximal RCA stenosis.  Echoes after that time showed mildly decreased systolic function in the 45-50% range.  He was generally asymptomatic, however, until around 6/23.  In 6/23, he developed progressive dyspnea and peripheral edema.  Echo in 6/23 showed EF 40-45%, mild LVH, grade 2 diastolic dysfunction, RV normal, severe LAE, mild-moderate MR.  He was started on Bumex which gave him headaches, this was switched to Lasix.  Peripheral edema improved but he has continued to feel short of breath with exertion.  No definite trigger for symptoms, no episode of prolonged chest pain or severe viral-type illness.  He has a chronic cough.  He has poor appetite and has generally been losing weight.  He has orthopnea and keeps the head of the bed raised.  No bendopnea.  No chest pain. No palpitations.  Currently, he is short of breath walking 50 feet or walking up stairs.  He had coronary angiography in 7/23, showing patent RCA stent and nonobstructive mild CAD. He drank heavily in the past but not in the last 5 years (3-4 glasses wine/week now). He has a family history of CAD (father at around 61) but not cardiomyopathy or sudden cardiac death.    ECG (personally reviewed): NSR, IVCD 130 msec  REDS clip 47%  Labs (6/23): pro-BNP 2381 Labs (7/23): K 4.5, creatinine 1.03, pro-BNP 1455  PMH: 1. HTN 2. Hyperlipidemia 3. CAD: LHC in 4/17 with 90% pRCA treated with DES.  - LHC (7/23): Patent RCA stent, mild nonobstructive disease.  4. Chronic HF with mid range EF: ?Ischemic cardiomyopathy.  - Echo (3/18): EF 50%.  - Echo (7/21): EF 45-50%, septal HK, moderate MR - Echo (6/23): EF 40-45%, mild LVH, grade 2 diastolic dysfunction, RV normal, severe LAE, mild-moderate MR.   SH: Married x 2, now widower.  Has 2 children.   Lives in [place].  Designer, television/film set.  Drinks 3-4 glasses wine/week. Nonsmoker though exposed to 2nd hand smoke.   FH: Father with MI in his 13s, mother with brain aneurysm in her 62s.   Current Outpatient Medications  Medication Sig Dispense Refill   amitriptyline (ELAVIL) 50 MG tablet Take 50 mg by mouth at bedtime.     aspirin EC 81 MG tablet Take 81 mg by mouth daily.     carvedilol (COREG) 25 MG tablet Take 1 tablet (25 mg total) by mouth 2 (two) times daily. 60 tablet 0   Cholecalciferol 25 MCG (1000 UT) tablet Take 1,000 Units by mouth daily.     doxazosin (CARDURA) 4 MG tablet Take 4 mg by mouth every morning.     DULoxetine (CYMBALTA) 20 MG capsule TAKE 1 CAPSULE(20 MG) BY MOUTH DAILY 30 capsule 3   empagliflozin (JARDIANCE) 10 MG TABS tablet Take 1 tablet (10 mg total) by mouth every morning. 90 tablet 0   ENTRESTO 97-103 MG TAKE 1 TABLET BY MOUTH TWICE DAILY 60 tablet 3   hydrALAZINE (APRESOLINE) 50 MG tablet Take 50 mg by mouth 2 (two) times daily.     lubiprostone (AMITIZA) 8 MCG capsule Take 8 mcg by mouth 2 (two) times daily with a meal.     methocarbamol (ROBAXIN) 750 MG tablet Take 750 mg by mouth at bedtime.     nitroGLYCERIN (NITROSTAT) 0.4 MG SL tablet Place 1  tablet (0.4 mg total) under the tongue every 5 (five) minutes as needed for chest pain. If you require more than two tablets five minutes apart go to the nearest ER via EMS. 30 tablet 0   pyridoxine (B-6) 100 MG tablet Take 100 mg by mouth daily.     rOPINIRole (REQUIP) 0.5 MG tablet Take 1.5 mg by mouth at bedtime.     rOPINIRole (REQUIP) 0.5 MG tablet Take 1.5 mg by mouth daily in the afternoon.     rosuvastatin (CRESTOR) 20 MG tablet Take 20 mg by mouth at bedtime.     spironolactone (ALDACTONE) 25 MG tablet TAKE 1/2 TABLET(12.5 MG) BY MOUTH DAILY 45 tablet 2   tadalafil (CIALIS) 20 MG tablet Take 20 mg by mouth daily as needed for erectile dysfunction.     tamsulosin (FLOMAX) 0.4 MG CAPS capsule Take 1  capsule (0.4 mg total) by mouth in the morning and at bedtime. 60 capsule 11   furosemide (LASIX) 40 MG tablet Take 1 tablet (40 mg total) by mouth 2 (two) times daily. 180 tablet 3   potassium chloride (KLOR-CON) 10 MEQ tablet Take 2 tablets (20 mEq total) by mouth daily. 180 tablet 3   No current facility-administered medications for this encounter.   BP 120/70   Pulse 94   Wt 86.7 kg (191 lb 3.2 oz)   SpO2 96%   BMI 28.24 kg/m  General: NAD Neck: JVP 8 cm, no thyromegaly or thyroid nodule.  Lungs: Clear to auscultation bilaterally with normal respiratory effort. CV: Nondisplaced PMI.  Heart regular S1/S2, no S3/S4, no murmur.  Trace ankle edema.  No carotid bruit.  Normal pedal pulses.  Abdomen: Soft, nontender, no hepatosplenomegaly, no distention.  Skin: Intact without lesions or rashes.  Neurologic: Alert and oriented x 3.  Psych: Normal affect. Extremities: No clubbing or cyanosis.  HEENT: Normal.   Assessment/Plan: 1. Chronic HF with mid range EF:  Most recent echo in 6/23 showed EF 40-45%, mild LVH, grade 2 diastolic dysfunction, RV normal, severe LAE, mild-moderate MR.  He has a history of CAD with PCI to proximal RCA in 2017, but I do not think that CAD/ischemic cardiomyopathy is a definite explanation for his cardiomyopathy/CHF.  Coronary angiography in 7/23 showed patent RCA stent and nonobstructive mild CAD.  He cannot identify a definite trigger for the onset of symptoms this summer.  Could he have had an episode of myocarditis? NYHA class III symptoms, but symptoms are much more impressive than his exam.  However, he has had significantly elevated pro-BNP and REDS clip today was 47%, suggesting quite significant volume overload.  - I would like him to get a cardiac MRI to assess for infiltrative disease, etc, as I do not think that CAD explains his worsening HF.  Will get flow evaluation with this to make sure he does not have significant MR (mild-moderate on last echo, do  not hear significant murmur on exam).  - CPX to assess functional capacity, this will give [place] PFTs and identify any component to his symptoms from lung disease.  - I will arrange for RHC to assess filling pressures and PA pressure, as REDS clip suggests more significant volume overload than exam.  We discussed risks/benefits and he agrees to procedure.  - Increase Lasix to 40 mg bid and KCl to 20 daily.  BMET/BNP today and again in 10 days.  - Check CBC and TSH. - Continue current Coreg, Jardiance, Entresto, and spironolactone. Will increase spironolactone to 25 mg  daily at next appt.  2. CAD: 2017 PCI to RCA.  Cath 7/23 with patent RCA stent and nonobstructive mild disease.  This does not explain his symptoms/volume overload.  - Continue ASA 81 daily.  - Continue Crestor 20 mg daily.   Followup with me in 3 wks after studies.   Loralie Champagne 02/08/2022

## 2022-02-09 ENCOUNTER — Encounter: Payer: Self-pay | Admitting: Cardiology

## 2022-02-09 ENCOUNTER — Telehealth (HOSPITAL_COMMUNITY): Payer: Self-pay | Admitting: Emergency Medicine

## 2022-02-09 ENCOUNTER — Encounter (HOSPITAL_COMMUNITY): Payer: Self-pay | Admitting: Cardiology

## 2022-02-09 ENCOUNTER — Telehealth (HOSPITAL_COMMUNITY): Payer: Self-pay | Admitting: *Deleted

## 2022-02-09 NOTE — Telephone Encounter (Signed)
From patient.

## 2022-02-09 NOTE — Telephone Encounter (Signed)
Reaching out to patient to offer assistance regarding upcoming cardiac imaging study; pt verbalizes understanding of appt date/time, parking situation and where to check in, and verified current allergies; name and call back number provided for further questions should they arise Rockwell Alexandria RN Navigator Cardiac Imaging [facility] [phone] office [phone] cell   Denies claustro Denies iv issues Has joint replacements and spinal surgery hardware also a spinal stiumlator- will bring remote to turn off Arrival 830 w/c entrnace

## 2022-02-10 ENCOUNTER — Other Ambulatory Visit (HOSPITAL_COMMUNITY): Payer: Medicare PPO

## 2022-02-12 ENCOUNTER — Other Ambulatory Visit: Payer: Self-pay

## 2022-02-12 ENCOUNTER — Encounter (HOSPITAL_COMMUNITY): Payer: Self-pay | Admitting: Cardiology

## 2022-02-12 ENCOUNTER — Emergency Department (HOSPITAL_COMMUNITY): Payer: Medicare PPO

## 2022-02-12 ENCOUNTER — Emergency Department (HOSPITAL_COMMUNITY)
Admission: EM | Admit: 2022-02-12 | Discharge: 2022-02-12 | Disposition: A | Discharge status: Home / Self Care | Payer: Medicare PPO | Attending: Emergency Medicine | Admitting: Emergency Medicine

## 2022-02-12 ENCOUNTER — Encounter (HOSPITAL_COMMUNITY): Payer: Self-pay | Admitting: Emergency Medicine

## 2022-02-12 DIAGNOSIS — R42 Dizziness and giddiness: Secondary | ICD-10-CM | POA: Diagnosis not present

## 2022-02-12 DIAGNOSIS — Z7982 Long term (current) use of aspirin: Secondary | ICD-10-CM | POA: Insufficient documentation

## 2022-02-12 DIAGNOSIS — I509 Heart failure, unspecified: Secondary | ICD-10-CM | POA: Diagnosis not present

## 2022-02-12 DIAGNOSIS — I110 Hypertensive heart disease with heart failure: Secondary | ICD-10-CM | POA: Diagnosis not present

## 2022-02-12 DIAGNOSIS — I5030 Unspecified diastolic (congestive) heart failure: Secondary | ICD-10-CM | POA: Diagnosis not present

## 2022-02-12 DIAGNOSIS — I2510 Atherosclerotic heart disease of native coronary artery without angina pectoris: Secondary | ICD-10-CM | POA: Insufficient documentation

## 2022-02-12 DIAGNOSIS — R55 Syncope and collapse: Secondary | ICD-10-CM | POA: Diagnosis not present

## 2022-02-12 DIAGNOSIS — E860 Dehydration: Secondary | ICD-10-CM

## 2022-02-12 DIAGNOSIS — R0902 Hypoxemia: Secondary | ICD-10-CM | POA: Diagnosis not present

## 2022-02-12 DIAGNOSIS — J811 Chronic pulmonary edema: Secondary | ICD-10-CM | POA: Diagnosis not present

## 2022-02-12 DIAGNOSIS — G43909 Migraine, unspecified, not intractable, without status migrainosus: Secondary | ICD-10-CM | POA: Diagnosis not present

## 2022-02-12 DIAGNOSIS — G4489 Other headache syndrome: Secondary | ICD-10-CM | POA: Diagnosis not present

## 2022-02-12 DIAGNOSIS — M549 Dorsalgia, unspecified: Secondary | ICD-10-CM | POA: Diagnosis not present

## 2022-02-12 DIAGNOSIS — I5042 Chronic combined systolic (congestive) and diastolic (congestive) heart failure: Secondary | ICD-10-CM

## 2022-02-12 LAB — CBC WITH DIFFERENTIAL/PLATELET
Abs Immature Granulocytes: 0.01 10*3/uL (ref 0.00–0.07)
Basophils Absolute: 0.1 10*3/uL (ref 0.0–0.1)
Basophils Relative: 1 %
Eosinophils Absolute: 0.1 10*3/uL (ref 0.0–0.5)
Eosinophils Relative: 2 %
HCT: 37.4 % — ABNORMAL LOW (ref 39.0–52.0)
Hemoglobin: 12.6 g/dL — ABNORMAL LOW (ref 13.0–17.0)
Immature Granulocytes: 0 %
Lymphocytes Relative: 29 %
Lymphs Abs: 1.8 10*3/uL (ref 0.7–4.0)
MCH: 31.8 pg (ref 26.0–34.0)
MCHC: 33.7 g/dL (ref 30.0–36.0)
MCV: 94.4 fL (ref 80.0–100.0)
Monocytes Absolute: 0.9 10*3/uL (ref 0.1–1.0)
Monocytes Relative: 15 %
Neutro Abs: 3.5 10*3/uL (ref 1.7–7.7)
Neutrophils Relative %: 53 %
Platelets: 212 10*3/uL (ref 150–400)
RBC: 3.96 MIL/uL — ABNORMAL LOW (ref 4.22–5.81)
RDW: 14.6 % (ref 11.5–15.5)
WBC: 6.4 10*3/uL (ref 4.0–10.5)
nRBC: 0 % (ref 0.0–0.2)

## 2022-02-12 LAB — COMPREHENSIVE METABOLIC PANEL
ALT: 16 U/L (ref 0–44)
AST: 27 U/L (ref 15–41)
Albumin: 3.7 g/dL (ref 3.5–5.0)
Alkaline Phosphatase: 70 U/L (ref 38–126)
Anion gap: 9 (ref 5–15)
BUN: 17 mg/dL (ref 8–23)
CO2: 27 mmol/L (ref 22–32)
Calcium: 9.1 mg/dL (ref 8.9–10.3)
Chloride: 101 mmol/L (ref 98–111)
Creatinine, Ser: 1.67 mg/dL — ABNORMAL HIGH (ref 0.61–1.24)
GFR, Estimated: 42 mL/min — ABNORMAL LOW (ref >60)
Glucose, Bld: 106 mg/dL — ABNORMAL HIGH (ref 70–99)
Potassium: 4.2 mmol/L (ref 3.5–5.1)
Sodium: 137 mmol/L (ref 135–145)
Total Bilirubin: 0.5 mg/dL (ref 0.3–1.2)
Total Protein: 6.6 g/dL (ref 6.5–8.1)

## 2022-02-12 LAB — MAGNESIUM: Magnesium: 2.3 mg/dL (ref 1.7–2.4)

## 2022-02-12 MED ORDER — LACTATED RINGERS IV BOLUS
500.0000 mL | Freq: Once | INTRAVENOUS | Status: AC
  Administered 2022-02-12: 500 mL via INTRAVENOUS

## 2022-02-12 NOTE — ED Triage Notes (Signed)
Pt BIB [facility] for dizziness starting this AM and worsening over the morning. Pt recently diagnosed with CHF and started on lasix, 20mg  bid. EMS reports initial BP was 80/50. Most recent BP with EMS was 106/58.

## 2022-02-12 NOTE — ED Notes (Signed)
Pt ambulatory to RR without difficulty and without incident. Pt denies complaints during and after ambulating.

## 2022-02-12 NOTE — ED Notes (Signed)
Pt verbalizes understanding of discharge instructions. Opportunity for questions and answers were provided. Pt discharged from the ED.   ?

## 2022-02-12 NOTE — Discharge Instructions (Addendum)
You were seen in the emergency room today for for lightheadedness.  You were found to be dehydrated.  This is likely due to your increased dose of Lasix.  For this reason we would like you to go back to your old dose of Lasix and do your best to rehydrate at home.  If you feel like you are going to faint again or have worsening symptoms please come back to the emergency department otherwise we would like you to follow-up with your regular cardiologist or regular doctor in the next several days.  You need to have your kidney function checked again within the next several days to a week.  Your cardiologist or your regular doctor can do this.

## 2022-02-12 NOTE — ED Provider Notes (Signed)
[facility] EMERGENCY DEPARTMENT Provider Note   CSN: [id number] Arrival date & time: 02/12/22  1202     History  Chief Complaint  Patient presents with   Dizziness    Brian Gallegos is a 76 y.o. male with past medical history of hypertension, hyperlipidemia, coronary artery disease status post DES to the RCA placed in 2017 with recent  left heart cath on 01-17-2022 that revealed no new obstructive disease, newly diagnosed mild HFrEF with ejection fraction of 40-45% on Lasix who presents to the emergency department for generalized fatigue, lightheadedness and several presyncopal episodes today.  Patient states that he woke up in his normal state of health however after standing up he had felt severely lightheaded and generally weak with visual blurriness bilaterally.  His vision had subsequently returned to baseline within minutes.  The patient went about his day and it subsequently felt presyncopal on 3 separate occasions with positional change.  He had taken his blood pressure at home and found it to be 60/40 which had prompted him to summon EMS.  He notes that after his dose of Lasix was doubled 1 week ago he has begun feeling that his mouth is dry as well.  He notes that his urinary output has increased significantly.  He is currently denying chest pain, shortness of breath or any other symptoms with the exception of generalized fatigue especially after standing.    Dizziness Associated symptoms: no chest pain, no palpitations, no shortness of breath and no vomiting        Home Medications Prior to Admission medications   Medication Sig Start Date End Date Taking? Authorizing Provider  amitriptyline (ELAVIL) 50 MG tablet Take 50 mg by mouth at bedtime.    [provider]  aspirin EC 81 MG tablet Take 81 mg by mouth daily.    [provider]  carvedilol (COREG) 25 MG tablet Take 1 tablet (25 mg total) by mouth 2 (two) times daily. 04/14/21 02/09/23   Tolia, Sunit, DO  Cholecalciferol 25 MCG (1000 UT) tablet Take 1,000 Units by mouth daily.    [provider]  doxazosin (CARDURA) 4 MG tablet Take 4 mg by mouth every morning.    [provider]  DULoxetine (CYMBALTA) 20 MG capsule TAKE 1 CAPSULE(20 MG) BY MOUTH DAILY 12/03/21   Kerrin Champagne, MD  empagliflozin (JARDIANCE) 10 MG TABS tablet Take 1 tablet (10 mg total) by mouth every morning. 01/27/22 04/27/22  Tolia, Sunit, DO  ENTRESTO 97-103 MG TAKE 1 TABLET BY MOUTH TWICE DAILY 11/07/21   Tolia, Sunit, DO  furosemide (LASIX) 40 MG tablet Take 1 tablet (40 mg total) by mouth 2 (two) times daily. 02/08/22   Laurey Morale, MD  hydrALAZINE (APRESOLINE) 50 MG tablet Take 50 mg by mouth 2 (two) times daily.    [provider]  lubiprostone (AMITIZA) 8 MCG capsule Take 8 mcg by mouth 2 (two) times daily with a meal. 06/21/20   [provider]  methocarbamol (ROBAXIN) 750 MG tablet Take 750 mg by mouth at bedtime.    [provider]  nitroGLYCERIN (NITROSTAT) 0.4 MG SL tablet Place 1 tablet (0.4 mg total) under the tongue every 5 (five) minutes as needed for chest pain. If you require more than two tablets five minutes apart go to the nearest ER via EMS. 12/22/21 02/09/23  Tolia, Sunit, DO  potassium chloride (KLOR-CON) 10 MEQ tablet Take 2 tablets (20 mEq total) by mouth daily. 02/08/22  Laurey Morale, MD  pyridoxine (B-6) 100 MG tablet Take 100 mg by mouth daily.    [provider]  rOPINIRole (REQUIP) 0.5 MG tablet Take 1.5 mg by mouth at bedtime.    [provider]  rOPINIRole (REQUIP) 0.5 MG tablet Take 1.5 mg by mouth daily in the afternoon.    [provider]  rosuvastatin (CRESTOR) 20 MG tablet Take 20 mg by mouth at bedtime.    [provider]  spironolactone (ALDACTONE) 25 MG tablet TAKE 1/2 TABLET(12.5 MG) BY MOUTH DAILY 12/05/21   Tolia, Sunit, DO  tadalafil (CIALIS) 20 MG tablet Take 20 mg by mouth daily as needed  for erectile dysfunction.    [provider]  tamsulosin (FLOMAX) 0.4 MG CAPS capsule Take 1 capsule (0.4 mg total) by mouth in the morning and at bedtime. 04/19/21   McKenzie, Mardene Celeste, MD      Allergies    Bisacodyl, Codeine, and Finasteride    Review of Systems   Review of Systems  Constitutional:  Positive for fever. Negative for chills.  HENT:  Negative for ear pain and sore throat.   Eyes:  Negative for pain and visual disturbance.  Respiratory:  Negative for cough and shortness of breath.   Cardiovascular:  Negative for chest pain and palpitations.  Gastrointestinal:  Negative for abdominal pain and vomiting.  Endocrine: Negative for polyphagia.  Genitourinary:  Negative for dysuria and hematuria.  Musculoskeletal:  Negative for arthralgias and back pain.  Skin:  Negative for color change and rash.  Neurological:  Positive for dizziness. Negative for seizures and syncope.  All other systems reviewed and are negative.   Physical Exam Updated Vital Signs BP (!) 103/52   Pulse 89   Temp 97.9 F (36.6 C) (Oral)   Resp (!) 21   Ht 5\' 9"  (1.753 m)   Wt 82.6 kg   SpO2 94%   BMI 26.88 kg/m  Physical Exam Vitals and nursing note reviewed.  Constitutional:      General: He is not in acute distress.    Appearance: He is well-developed.     Comments: Upon entering the exam room the patient is sitting upright awake and alert no acute distress.  HENT:     Head: Normocephalic and atraumatic.     Mouth/Throat:     Mouth: Mucous membranes are dry.  Eyes:     Conjunctiva/sclera: Conjunctivae normal.  Cardiovascular:     Rate and Rhythm: Normal rate and regular rhythm.     Heart sounds: No murmur heard.    Comments: Regular rate and rhythm no murmurs or gallops Pulmonary:     Effort: Pulmonary effort is normal. No respiratory distress.     Breath sounds: Normal breath sounds.     Comments: Saturating near 100% on room air.  Speaking in full sentences without  difficulty.  Faint Rales at the bases bilaterally.  No increased work of breathing whatsoever. Abdominal:     Palpations: Abdomen is soft.     Tenderness: There is no abdominal tenderness.     Comments: Soft nondistended nontender  Musculoskeletal:        General: No swelling.     Cervical back: Neck supple.  Skin:    General: Skin is warm and dry.     Capillary Refill: Capillary refill takes less than 2 seconds.  Neurological:     Mental Status: He is alert.     Comments: Fully alert and oriented moving all extremity spontaneously  grossly neurologically intact  Psychiatric:        Mood and Affect: Mood normal.     ED Results / Procedures / Treatments   Labs (all labs ordered are listed, but only abnormal results are displayed) Labs Reviewed - No data to display  EKG None  Radiology No results found.  Procedures Ultrasound ED Echo  Date/Time: 02/12/2022 1:07 PM  Performed by: Duard Brady, MD Authorized by: Maia Plan, MD   Procedure details:    Indications: hypotension     Views: subxiphoid, parasternal long axis view, parasternal short axis view, apical 4 chamber view and IVC view     Images: not archived   Findings:    Pericardium: no pericardial effusion     LV Function: depressed (30 - 50%)     IVC: collapsed   Impression:    Impression: probable low CVP and decreased contractility       Medications Ordered in ED Medications - No data to display  ED Course/ Medical Decision Making/ A&P                           Medical Decision Making Amount and/or Complexity of Data Reviewed Labs: ordered. Radiology: ordered.   The patient presents to the emergency department with a presyncopal symptoms in the setting of a home blood pressure in the 60s over 40s, 80s over 50s with EMS and borderline hypotension upon presentation without associated tachycardia or distress.  Patient is currently asymptomatic with borderline blood pressures as above.   Differential diagnosis includes iatrogenic etiology likely related to recently doubled dose of Lasix versus less likely related to ACS in the absence of cardiopulmonary symptoms versus less likely PE for the same reason.  Do not suspect sepsis given lack of fever, chills or other focal findings on exam.  I have personally reviewed and interpreted the patient's EKG which was remarkable for normal sinus rhythm, normal axis, no ST segment changes consistent with ischemia.  Point-of-care ultrasound as above was remarkable for reduced ejection fraction is grossly consistent with his recent baseline as well as completely collapsible IVC  We will provide 500 cc lactated ringer bolus and reassess.  Will obtain basic labs.  Patient ambulated without difficulty.  I have personally reviewed and interpreted the patient laboratory work-up which was markable for mild AKI.    Patient's vitals have improved significantly.  Repeat assessment reveals that he is hemodynamically stable.  Patient was discharged home with instructions to follow-up with either his cardiologist or his regular doctor in the neck several days and have his metabolic panel checked again to ensure that his kidney function is improving.  Patient was instructed to back to his old dose of Lasix as he is likely becoming dehydrated from it.  Patient was discharged home in hemodynamically stable condition.        Final Clinical Impression(s) / ED Diagnoses Final diagnoses:  Dehydration    Rx / DC Orders ED Discharge Orders     None         Duard Brady, MD 02/12/22 1435    Maia Plan, MD 02/15/22 1614

## 2022-02-13 ENCOUNTER — Ambulatory Visit (HOSPITAL_COMMUNITY): Payer: Medicare PPO | Attending: Internal Medicine

## 2022-02-13 ENCOUNTER — Encounter (HOSPITAL_COMMUNITY): Payer: Self-pay | Admitting: *Deleted

## 2022-02-13 ENCOUNTER — Encounter (HOSPITAL_COMMUNITY): Payer: Self-pay | Admitting: Cardiology

## 2022-02-13 DIAGNOSIS — I5042 Chronic combined systolic (congestive) and diastolic (congestive) heart failure: Secondary | ICD-10-CM

## 2022-02-13 NOTE — Telephone Encounter (Signed)
From patient.

## 2022-02-13 NOTE — Progress Notes (Signed)
Patient arrived for CPX after an Emergency department visit yesterday for lightheadedness and low blood pressure. BP upon arrival today 78/56. Patient stated he feels much better today than yesterday, but still weak. With Risk stratification and discussion with clinic staff, all agree (including patient) to postpone until further direction from Dr. Shirlee Latch. Will route to Dr. Shirlee Latch.   Reggy Eye, MS, ACSM, [facility] Clinical Exercise Physiologist/ Health and Wellness Coach

## 2022-02-16 ENCOUNTER — Ambulatory Visit (HOSPITAL_COMMUNITY)
Admission: RE | Admit: 2022-02-16 | Discharge: 2022-02-16 | Disposition: A | Discharge status: Home / Self Care | Payer: Medicare PPO | Source: Ambulatory Visit | Attending: Internal Medicine | Admitting: Internal Medicine

## 2022-02-16 ENCOUNTER — Telehealth (HOSPITAL_COMMUNITY): Payer: Self-pay | Admitting: *Deleted

## 2022-02-16 DIAGNOSIS — I5042 Chronic combined systolic (congestive) and diastolic (congestive) heart failure: Secondary | ICD-10-CM | POA: Insufficient documentation

## 2022-02-16 LAB — BASIC METABOLIC PANEL
Anion gap: 5 (ref 5–15)
BUN: 16 mg/dL (ref 8–23)
CO2: 30 mmol/L (ref 22–32)
Calcium: 9.1 mg/dL (ref 8.9–10.3)
Chloride: 103 mmol/L (ref 98–111)
Creatinine, Ser: 1.11 mg/dL (ref 0.61–1.24)
GFR, Estimated: >60 mL/min (ref >60)
Glucose, Bld: 84 mg/dL (ref 70–99)
Potassium: 4.3 mmol/L (ref 3.5–5.1)
Sodium: 138 mmol/L (ref 135–145)

## 2022-02-16 NOTE — Telephone Encounter (Signed)
Pt unable to have cMRI b/c he has a spinal cord stimulator, Dr Shirlee Latch and pt both aware per radiology, order cancelled

## 2022-02-18 ENCOUNTER — Encounter (HOSPITAL_COMMUNITY): Payer: Self-pay

## 2022-02-20 ENCOUNTER — Telehealth (HOSPITAL_COMMUNITY): Payer: Self-pay

## 2022-02-20 NOTE — Telephone Encounter (Signed)
Spoke to patient,aware of procedure for tomorrow.Aware of nothing to eat or drink after midnight.Has transportation to and from procedure. Aware of holding Lasix ans spirolactone day of procedure

## 2022-02-21 ENCOUNTER — Other Ambulatory Visit: Payer: Self-pay

## 2022-02-21 ENCOUNTER — Ambulatory Visit (HOSPITAL_COMMUNITY)
Admission: RE | Admit: 2022-02-21 | Discharge: 2022-02-21 | Disposition: A | Discharge status: Home / Self Care | Payer: Medicare PPO | Attending: Cardiology | Admitting: Cardiology

## 2022-02-21 ENCOUNTER — Encounter (HOSPITAL_COMMUNITY)
Admission: RE | Disposition: A | Discharge status: Home / Self Care | Payer: Self-pay | Source: Home / Self Care | Attending: Cardiology

## 2022-02-21 DIAGNOSIS — I2510 Atherosclerotic heart disease of native coronary artery without angina pectoris: Secondary | ICD-10-CM | POA: Diagnosis not present

## 2022-02-21 DIAGNOSIS — I5032 Chronic diastolic (congestive) heart failure: Secondary | ICD-10-CM | POA: Insufficient documentation

## 2022-02-21 DIAGNOSIS — I110 Hypertensive heart disease with heart failure: Secondary | ICD-10-CM | POA: Diagnosis not present

## 2022-02-21 DIAGNOSIS — E785 Hyperlipidemia, unspecified: Secondary | ICD-10-CM | POA: Insufficient documentation

## 2022-02-21 DIAGNOSIS — I5042 Chronic combined systolic (congestive) and diastolic (congestive) heart failure: Secondary | ICD-10-CM

## 2022-02-21 DIAGNOSIS — Z79899 Other long term (current) drug therapy: Secondary | ICD-10-CM | POA: Insufficient documentation

## 2022-02-21 DIAGNOSIS — Z7982 Long term (current) use of aspirin: Secondary | ICD-10-CM | POA: Diagnosis not present

## 2022-02-21 DIAGNOSIS — I509 Heart failure, unspecified: Secondary | ICD-10-CM | POA: Diagnosis not present

## 2022-02-21 DIAGNOSIS — Z8249 Family history of ischemic heart disease and other diseases of the circulatory system: Secondary | ICD-10-CM | POA: Diagnosis not present

## 2022-02-21 HISTORY — PX: RIGHT HEART CATH: CATH118263

## 2022-02-21 LAB — POCT I-STAT EG7
Acid-Base Excess: 2 mmol/L (ref 0.0–2.0)
Acid-Base Excess: 2 mmol/L (ref 0.0–2.0)
Bicarbonate: 27.5 mmol/L (ref 20.0–28.0)
Bicarbonate: 27.6 mmol/L (ref 20.0–28.0)
Calcium, Ion: 1.24 mmol/L (ref 1.15–1.40)
Calcium, Ion: 1.24 mmol/L (ref 1.15–1.40)
HCT: 36 % — ABNORMAL LOW (ref 39.0–52.0)
HCT: 37 % — ABNORMAL LOW (ref 39.0–52.0)
Hemoglobin: 12.2 g/dL — ABNORMAL LOW (ref 13.0–17.0)
Hemoglobin: 12.6 g/dL — ABNORMAL LOW (ref 13.0–17.0)
O2 Saturation: 75 %
O2 Saturation: 75 %
Potassium: 4 mmol/L (ref 3.5–5.1)
Potassium: 4 mmol/L (ref 3.5–5.1)
Sodium: 138 mmol/L (ref 135–145)
Sodium: 138 mmol/L (ref 135–145)
TCO2: 29 mmol/L (ref 22–32)
TCO2: 29 mmol/L (ref 22–32)
pCO2, Ven: 45.2 mmHg (ref 44–60)
pCO2, Ven: 45.4 mmHg (ref 44–60)
pH, Ven: 7.391 (ref 7.25–7.43)
pH, Ven: 7.392 (ref 7.25–7.43)
pO2, Ven: 41 mmHg (ref 32–45)
pO2, Ven: 41 mmHg (ref 32–45)

## 2022-02-21 SURGERY — RIGHT HEART CATH
Anesthesia: LOCAL

## 2022-02-21 MED ORDER — LIDOCAINE HCL (PF) 1 % IJ SOLN
INTRAMUSCULAR | Status: DC | PRN
  Administered 2022-02-21: 2 mL

## 2022-02-21 MED ORDER — LIDOCAINE HCL (PF) 1 % IJ SOLN
INTRAMUSCULAR | Status: AC
  Filled 2022-02-21: qty 30

## 2022-02-21 MED ORDER — SODIUM CHLORIDE 0.9% FLUSH: 3.0000 mL | Freq: Two times a day (BID) | INTRAVENOUS | Status: DC

## 2022-02-21 MED ORDER — SODIUM CHLORIDE 0.9 % IV SOLN: INTRAVENOUS | Status: DC

## 2022-02-21 MED ORDER — HEPARIN (PORCINE) IN NACL 1000-0.9 UT/500ML-% IV SOLN
INTRAVENOUS | Status: DC | PRN
  Administered 2022-02-21 (×2): 500 mL

## 2022-02-21 MED ORDER — SODIUM CHLORIDE 0.9 % IV SOLN: 250.0000 mL | INTRAVENOUS | Status: DC | PRN

## 2022-02-21 MED ORDER — HEPARIN (PORCINE) IN NACL 1000-0.9 UT/500ML-% IV SOLN
INTRAVENOUS | Status: AC
  Filled 2022-02-21: qty 500

## 2022-02-21 MED ORDER — SODIUM CHLORIDE 0.9% FLUSH: 3.0000 mL | INTRAVENOUS | Status: DC | PRN

## 2022-02-21 SURGICAL SUPPLY — 5 items
CATH BALLN WEDGE 5F 110CM (CATHETERS) IMPLANT
KIT HEART LEFT (KITS) ×1 IMPLANT
PACK CARDIAC CATHETERIZATION (CUSTOM PROCEDURE TRAY) ×1 IMPLANT
SHEATH GLIDE SLENDER 4/5FR (SHEATH) IMPLANT
TRANSDUCER W/STOPCOCK (MISCELLANEOUS) ×1 IMPLANT

## 2022-02-21 NOTE — Progress Notes (Signed)
Patient was given discharge instructions. He verbalized understanding. 

## 2022-02-21 NOTE — Interval H&P Note (Signed)
History and Physical Interval Note:  02/21/2022 1:52 PM  Brian Gallegos  has presented today for surgery, with the diagnosis of CHF.  The various methods of treatment have been discussed with the patient and family. After consideration of risks, benefits and other options for treatment, the patient has consented to  Procedure(s): RIGHT HEART CATH (N/A) as a surgical intervention.  The patient's history has been reviewed, patient examined, no change in status, stable for surgery.  I have reviewed the patient's chart and labs.  Questions were answered to the patient's satisfaction.     Cyndia Degraff [employer]

## 2022-02-22 ENCOUNTER — Encounter (HOSPITAL_COMMUNITY): Payer: Self-pay | Admitting: Cardiology

## 2022-02-23 ENCOUNTER — Ambulatory Visit: Payer: Medicare PPO | Admitting: Internal Medicine

## 2022-02-23 ENCOUNTER — Encounter: Payer: Self-pay | Admitting: Internal Medicine

## 2022-02-23 VITALS — BP 110/70 | HR 98 | Ht 69.0 in | Wt 188.4 lb

## 2022-02-23 DIAGNOSIS — R0602 Shortness of breath: Secondary | ICD-10-CM

## 2022-02-23 NOTE — Progress Notes (Signed)
Brian Gallegos    [id number]    13-Dec-1945  Primary Care Physician:Miller, Misty Stanley, MD  Referring Physician: Carolin Coy, MD [address] [place],  [place] [address] Reason for Consultation: shortness of breath Date of Consultation: 02/23/2022  Chief complaint:   Chief Complaint  Patient presents with   Consult    Consult: SOB, had some fluid in lungs, dizziness.     HPI: Brian Gallegos is a 76 y.o. man who presents for new patient evaluation of shortness of breath. 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 He had a RHC earlier this week that showed normal LV filling pressures (PAOP 11) and normal cardiac output.   He feels overall his breathing is better after diuresis. Orthopnea has resolved. He can lay flat. Stable on lasix 40 mg once daily. Swelling in lower extremities has gone down.   However breathing gets worse in the heat and humidity. Dyspnea is with exertion.   Prior to June no shortness of breath. Never had pneumonia or bronchitis.  Never been prescribed inhalers or breathing treatments No strong family history of asthma or allergies.    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 Lived in [place] and [place] for a while. 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  Exposures: lives at home with his son and family - he has his own floor.  Smoking history: smoked for 2 weeks in the [employer], passive smoke exposure in parents and both spouses  Social History   Occupational History   Not on file  Tobacco Use   Smoking status: Never   Smokeless tobacco: Never  Vaping Use   Vaping Use: Never used  Substance and Sexual Activity   Alcohol use: Not Currently    Alcohol/week: 3.0 standard drinks of alcohol    Types: 3 Glasses of wine per week    Comment: few times week   Drug use: No   Sexual activity: Not Currently    Relevant family history:  Family History  Problem Relation Age of  Onset   Aneurysm Mother    Heart attack Father    Hypertension Father    CAD Father    Cancer Other     Past Medical History:  Diagnosis Date   Arthritis    "all over"   CHF (congestive heart failure) (HCC)    Chronic lower back pain    Coronary artery disease    Early cataracts, bilateral    Family history of adverse reaction to anesthesia    "son had ligament repair of elbow in 2016; woke up nauseated and disoriented"   Hypercholesterolemia    Hypertension    Migraine    "none in years" (10/12/2015)   Restless leg syndrome    Wears glasses     Past Surgical History:  Procedure Laterality Date   ANTERIOR CERVICAL DECOMP/DISCECTOMY FUSION  ~ 2005   ANTERIOR CERVICAL DISCECTOMY     BACK SURGERY     CARDIAC CATHETERIZATION N/A 10/12/2015   Procedure: Left Heart Cath and Coronary Angiography;  Surgeon: Yates Decamp, MD;  Location: [facility] INVASIVE CV LAB;  Service: Cardiovascular;  Laterality: N/A;   CARDIAC CATHETERIZATION  10/12/2015   Procedure: Coronary Stent Intervention;  Surgeon: Yates Decamp, MD;  Location: [facility] INVASIVE CV LAB;  Service: Cardiovascular;;   COLONOSCOPY     CORONARY ANGIOPLASTY  10/12/2015   DES RCA   CYSTOSCOPY  WITH INSERTION OF UROLIFT N/A 05/02/2021   Procedure: CYSTOSCOPY WITH INSERTION OF UROLIFT;  Surgeon: Malen Gauze, MD;  Location: [facility] ORS;  Service: Urology;  Laterality: N/A;   EYE SURGERY Left    cataract   FRACTURE SURGERY     nose repaired from accident   GLAUCOMA SURGERY Left 03/04/2015   JOINT REPLACEMENT     KNEE ARTHROSCOPY Bilateral    LEFT HEART CATH AND CORONARY ANGIOGRAPHY N/A 01/17/2022   Procedure: LEFT HEART CATH AND CORONARY ANGIOGRAPHY;  Surgeon: Yates Decamp, MD;  Location: [facility] INVASIVE CV LAB;  Service: Cardiovascular;  Laterality: N/A;   LUMBAR DISC SURGERY  ~ 1986-02/08/2007 X 6   MAXIMUM ACCESS (MAS)POSTERIOR LUMBAR INTERBODY FUSION (PLIF) 3 LEVEL  02/08/2007   w/rods and screws   RIGHT HEART CATH N/A 02/21/2022   Procedure:  RIGHT HEART CATH;  Surgeon: Laurey Morale, MD;  Location: [facility] INVASIVE CV LAB;  Service: Cardiovascular;  Laterality: N/A;   SPINAL CORD STIMULATOR INSERTION N/A 05/08/2014   Procedure: LUMBAR SPINAL CORD STIMULATOR IMPLANT ;  Surgeon: Gwynne Edinger, MD;  Location: [facility] NEURO ORS;  Service: Neurosurgery;  Laterality: N/A;   TOE FUSION Right 06/02/2014   "outside part of big toe"   TOTAL KNEE ARTHROPLASTY Left ~ 2011     Physical Exam: Blood pressure 110/70, pulse 98, height 5\' 9"  (1.753 m), weight 188 lb 6.4 oz (85.5 kg), SpO2 96 %. Gen:      No acute distress ENT:  no nasal polyps, mucus membranes moist Lungs:    No increased respiratory effort, symmetric chest wall excursion, clear to auscultation bilaterally, no wheezes or crackles CV:         Regular rate and rhythm; no murmurs, rubs, or gallops.  No pedal edema Abd:      + bowel sounds; soft, non-tender; no distension MSK: no acute synovitis of DIP or PIP joints, no mechanics hands.  Skin:      Warm and dry; no rashes Neuro: normal speech, no focal facial asymmetry Psych: alert and oriented x3, normal mood and affect   Data Reviewed/Medical Decision Making:  Independent interpretation of tests: Imaging:  Review of patient's chest xray images august 2023 revealed cardiomegaly, pulmonary edema. The patient's images have been independently reviewed by me.    PFTs:    Echocardiogram June 2023  LVEF 40% Dilated LA Moderate MR LVH Grade 2 diastolic dysfunction  Labs:  Lab Results  Component Value Date   NA 138 02/21/2022   K 4.0 02/21/2022   CL 103 02/16/2022   CO2 30 02/16/2022     Lab Results  Component Value Date   NA 138 02/21/2022   K 4.0 02/21/2022   CL 103 02/16/2022   CO2 30 02/16/2022     Immunization status:  Immunization History  Administered Date(s) Administered   Fluad Quad(high Dose 65+) 05/11/2020   Influenza Split 04/02/2013, 03/03/2017   Influenza, High Dose Seasonal PF 03/03/2014,  04/07/2016, 05/08/2017, 03/25/2018, 04/29/2018, 03/28/2019   Influenza-Unspecified 06/08/2004, 05/11/2005, 05/16/2006, 04/11/2007, 03/10/2008, 04/02/2009, 03/03/2010, 04/19/2011, 04/28/2011, 03/03/2012, 03/04/2019, 04/08/2021   Moderna Covid-19 Vaccine Bivalent Booster 26yrs & up 04/25/2021   Moderna Sars-Covid-2 Vaccination 08/16/2019, 09/13/2019, 05/18/2020, 10/05/2020   Pneumococcal Conjugate-13 10/17/2013   Pneumococcal Polysaccharide-23 11/22/2021   Pneumococcal-Unspecified 04/11/2011   Tdap 12/15/2015, 11/19/2021   Zoster, Live 01/21/2013     I reviewed prior external note(s) from [facility]  I reviewed the result(s) of the labs and imaging as noted above.  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 I'm glad they're better.  We will obtain some breathing testing to rule out lung disease. I will communicate the results to you through mychart.   Follow up with me as needed or if symptoms change or worsen.  Continue lasix at current dose - discussed taking earlier in the day to prevent worsening of nocturia.   We discussed disease management and progression at length today.   I spent 45 minutes in the care of this patient today including pre-charting, chart review, review of results, face-to-face care, coordination of care and communication with consultants etc.).   Return to Care: Return if symptoms worsen or fail to improve.  Lenice Llamas, MD [facility]monary [facility] [place]  CC: Angelina Pih, MD

## 2022-02-23 NOTE — Patient Instructions (Signed)
Follow up with me as needed or if symptoms change or worsen.  Schedule breathing testing at front desk - full set of PFTs 1 hour.  I think most of your breathing issues were related to your heart. I'm glad they're better.   We will obtain some breathing testing to rule out lung disease. I will communicate the results to you through mychart.

## 2022-02-27 ENCOUNTER — Institutional Professional Consult (permissible substitution): Payer: Medicare PPO | Admitting: Internal Medicine

## 2022-02-28 ENCOUNTER — Ambulatory Visit (HOSPITAL_COMMUNITY): Payer: Medicare PPO | Attending: Cardiology

## 2022-02-28 DIAGNOSIS — G2581 Restless legs syndrome: Secondary | ICD-10-CM | POA: Diagnosis not present

## 2022-02-28 DIAGNOSIS — E7800 Pure hypercholesterolemia, unspecified: Secondary | ICD-10-CM | POA: Insufficient documentation

## 2022-02-28 DIAGNOSIS — I2510 Atherosclerotic heart disease of native coronary artery without angina pectoris: Secondary | ICD-10-CM | POA: Diagnosis not present

## 2022-02-28 DIAGNOSIS — H268 Other specified cataract: Secondary | ICD-10-CM | POA: Diagnosis not present

## 2022-02-28 DIAGNOSIS — G8929 Other chronic pain: Secondary | ICD-10-CM | POA: Diagnosis not present

## 2022-02-28 DIAGNOSIS — M1990 Unspecified osteoarthritis, unspecified site: Secondary | ICD-10-CM | POA: Diagnosis not present

## 2022-02-28 DIAGNOSIS — I5042 Chronic combined systolic (congestive) and diastolic (congestive) heart failure: Secondary | ICD-10-CM

## 2022-02-28 DIAGNOSIS — M545 Low back pain, unspecified: Secondary | ICD-10-CM | POA: Diagnosis not present

## 2022-02-28 DIAGNOSIS — I110 Hypertensive heart disease with heart failure: Secondary | ICD-10-CM | POA: Insufficient documentation

## 2022-02-28 DIAGNOSIS — I509 Heart failure, unspecified: Secondary | ICD-10-CM | POA: Insufficient documentation

## 2022-02-28 DIAGNOSIS — G43909 Migraine, unspecified, not intractable, without status migrainosus: Secondary | ICD-10-CM | POA: Insufficient documentation

## 2022-03-01 ENCOUNTER — Ambulatory Visit: Payer: Medicare PPO | Admitting: Orthopaedic Surgery

## 2022-03-10 ENCOUNTER — Other Ambulatory Visit: Payer: Self-pay | Admitting: Cardiology

## 2022-03-12 ENCOUNTER — Encounter (HOSPITAL_COMMUNITY): Payer: Self-pay | Admitting: Cardiology

## 2022-03-13 ENCOUNTER — Encounter (HOSPITAL_COMMUNITY): Payer: Medicare PPO | Admitting: Cardiology

## 2022-03-14 ENCOUNTER — Other Ambulatory Visit (HOSPITAL_COMMUNITY): Payer: Self-pay

## 2022-03-14 ENCOUNTER — Encounter (HOSPITAL_COMMUNITY): Payer: Self-pay | Admitting: Cardiology

## 2022-03-14 ENCOUNTER — Other Ambulatory Visit (HOSPITAL_COMMUNITY): Payer: Self-pay | Admitting: *Deleted

## 2022-03-14 MED ORDER — ENTRESTO 24-26 MG PO TABS
1.0000 | ORAL_TABLET | Freq: Two times a day (BID) | ORAL | 11 refills | Status: DC
Start: 2022-03-14 — End: 2022-06-20

## 2022-03-21 ENCOUNTER — Encounter (HOSPITAL_COMMUNITY): Payer: Self-pay | Admitting: Cardiology

## 2022-03-21 ENCOUNTER — Ambulatory Visit (HOSPITAL_COMMUNITY)
Admission: RE | Admit: 2022-03-21 | Discharge: 2022-03-21 | Disposition: A | Discharge status: Home / Self Care | Payer: Medicare PPO | Source: Ambulatory Visit | Attending: Cardiology | Admitting: Cardiology

## 2022-03-21 VITALS — BP 100/50 | HR 100 | Wt 188.8 lb

## 2022-03-21 DIAGNOSIS — Z8249 Family history of ischemic heart disease and other diseases of the circulatory system: Secondary | ICD-10-CM | POA: Insufficient documentation

## 2022-03-21 DIAGNOSIS — I255 Ischemic cardiomyopathy: Secondary | ICD-10-CM | POA: Diagnosis not present

## 2022-03-21 DIAGNOSIS — M545 Low back pain, unspecified: Secondary | ICD-10-CM | POA: Insufficient documentation

## 2022-03-21 DIAGNOSIS — I5042 Chronic combined systolic (congestive) and diastolic (congestive) heart failure: Secondary | ICD-10-CM | POA: Insufficient documentation

## 2022-03-21 DIAGNOSIS — Z7982 Long term (current) use of aspirin: Secondary | ICD-10-CM | POA: Insufficient documentation

## 2022-03-21 DIAGNOSIS — Z955 Presence of coronary angioplasty implant and graft: Secondary | ICD-10-CM | POA: Diagnosis not present

## 2022-03-21 DIAGNOSIS — E785 Hyperlipidemia, unspecified: Secondary | ICD-10-CM | POA: Diagnosis not present

## 2022-03-21 DIAGNOSIS — G8929 Other chronic pain: Secondary | ICD-10-CM | POA: Insufficient documentation

## 2022-03-21 DIAGNOSIS — I429 Cardiomyopathy, unspecified: Secondary | ICD-10-CM | POA: Diagnosis not present

## 2022-03-21 DIAGNOSIS — I2510 Atherosclerotic heart disease of native coronary artery without angina pectoris: Secondary | ICD-10-CM | POA: Diagnosis not present

## 2022-03-21 DIAGNOSIS — R519 Headache, unspecified: Secondary | ICD-10-CM | POA: Insufficient documentation

## 2022-03-21 DIAGNOSIS — Z79899 Other long term (current) drug therapy: Secondary | ICD-10-CM | POA: Diagnosis not present

## 2022-03-21 DIAGNOSIS — I110 Hypertensive heart disease with heart failure: Secondary | ICD-10-CM | POA: Insufficient documentation

## 2022-03-21 DIAGNOSIS — R42 Dizziness and giddiness: Secondary | ICD-10-CM | POA: Insufficient documentation

## 2022-03-21 LAB — BASIC METABOLIC PANEL
Anion gap: 10 (ref 5–15)
BUN: 13 mg/dL (ref 8–23)
CO2: 27 mmol/L (ref 22–32)
Calcium: 9.1 mg/dL (ref 8.9–10.3)
Chloride: 101 mmol/L (ref 98–111)
Creatinine, Ser: 1.14 mg/dL (ref 0.61–1.24)
GFR, Estimated: >60 mL/min (ref >60)
Glucose, Bld: 104 mg/dL — ABNORMAL HIGH (ref 70–99)
Potassium: 4.5 mmol/L (ref 3.5–5.1)
Sodium: 138 mmol/L (ref 135–145)

## 2022-03-21 LAB — BRAIN NATRIURETIC PEPTIDE: B Natriuretic Peptide: 450.2 pg/mL — ABNORMAL HIGH (ref 0.0–100.0)

## 2022-03-21 LAB — VITAMIN B12: Vitamin B-12: 288 pg/mL (ref 180–914)

## 2022-03-21 MED ORDER — BISOPROLOL FUMARATE 5 MG PO TABS: 2.5000 mg | ORAL_TABLET | Freq: Every day | ORAL | 3 refills | Status: DC

## 2022-03-21 NOTE — Patient Instructions (Addendum)
stop Hydralazine  Start Bisoprolol 2.5 mg daily ( 1/2 Tab) daily   Please follow up with our heart failure pharmacist in 3 weeks  Your physician recommends that you schedule a follow-up appointment in: 3 months  If you have any questions or concerns before your next appointment please send [place] a message through [place] or call our office at [phone].    TO LEAVE A MESSAGE FOR THE NURSE SELECT OPTION 2, PLEASE LEAVE A MESSAGE INCLUDING: YOUR NAME DATE OF BIRTH CALL BACK NUMBER REASON FOR CALL**this is important as we prioritize the call backs  YOU WILL RECEIVE A CALL BACK THE SAME DAY AS LONG AS YOU CALL BEFORE 4:00 PM  At the [facility], you and your health needs are our priority. As part of our continuing mission to provide you with exceptional heart care, we have created designated Provider Care Teams. These Care Teams include your primary Cardiologist (physician) and Advanced Practice Providers (APPs- Physician Assistants and Nurse Practitioners) who all work together to provide you with the care you need, when you need it.   You may see any of the following providers on your designated Care Team at your next follow up: Dr Glori Bickers Dr Loralie Champagne Dr. Roxana Hires, NP Lyda Jester, [place] [facility] [place], [place] Forestine Na, NP Audry Riles, PharmD   Please be sure to bring in all your medications bottles to every appointment.

## 2022-03-21 NOTE — Progress Notes (Signed)
PCP: Kathyrn Lass, MD HF Cardiology: Dr. Aundra Dubin  76 y.o. with history of HTN, hyperlipidemia, and CAD was referred by Dr. Terri Skains for evaluation of CHF.  Patient had a cath in 4/17 with DES to 90% proximal RCA stenosis.  Echoes after that time showed mildly decreased systolic function in the [id number] range.  He was generally asymptomatic, however, until around 6/23.  In 6/23, he developed progressive dyspnea and peripheral edema.  Echo in 6/23 showed EF 40-45%, mild LVH, grade 2 diastolic dysfunction, RV normal, severe LAE, mild-moderate MR.  He was started on Bumex which gave him headaches, this was switched to Lasix.  Peripheral edema improved but he has continued to feel short of breath with exertion.  No definite trigger for symptoms, no episode of prolonged chest pain or severe viral-type illness.  He had coronary angiography in 7/23, showing patent RCA stent and nonobstructive mild CAD. He drank heavily in the past but not in the last 5 years (3-4 glasses wine/week now). He has a family history of CAD (father at around 61) but not cardiomyopathy or sudden cardiac death.  He was noted to be volume overloaded and Lasix was increased.   RHC in 8/23 showed normal filling pressures on higher Lasix dose.  CPX in 8/23 showed low normal functional capacity, no more than mild functional limitation due to heart failure.  PFTs were within normal limits.   He returns today for followup of CHF.  He has had trouble tolerating medications due to orthostasis.  BP is low today but he is only rarely lightheaded with standing currently.  He is doing better overall.  Weight down 3 lbs.  He is short of breath with long walks.  He is able to play golf.  No orthopnea/PND.  No chest pain.  Main complaints are low back pain and headaches.  He has chronic low back pain and has a spinal stimulator.   ECG (personally reviewed): Sinus tachy 107 with LVH.   Labs (6/23): pro-BNP 2381 Labs (7/23): K 4.5, creatinine 1.03, pro-BNP  1455  PMH: 1. HTN 2. Hyperlipidemia 3. CAD: LHC in 4/17 with 90% pRCA treated with DES.  - LHC (7/23): Patent RCA stent, mild nonobstructive disease.  4. Chronic HF with mid range EF: ?Ischemic cardiomyopathy.  - Echo (3/18): EF 50%.  - Echo (7/21): EF 45-50%, septal HK, moderate MR - Echo (6/23): EF 40-45%, mild LVH, grade 2 diastolic dysfunction, RV normal, severe LAE, mild-moderate MR.  - RHC (8/23): mean RA 1, PA 33/13 mean 22, mean PCWP 11, CI 4.08 - CPX (8/23): peak VO2 18.9, VE/VCO2 slope 34, RER 1.22, normal PFTs => low normal functional status, no more than mild HF limitation.   SH: Married x 2, now widower.  Has 2 children.  Lives in [place].  [employer].  Drinks 3-4 glasses wine/week. Nonsmoker though exposed to 2nd hand smoke.   FH: Father with MI in his 18s, mother with brain aneurysm in her 41s.   Current Outpatient Medications  Medication Sig Dispense Refill   acetaminophen (TYLENOL) 500 MG tablet Take 500 mg by mouth every 6 (six) hours as needed (headache.).     amitriptyline (ELAVIL) 50 MG tablet Take 50 mg by mouth at bedtime.     aspirin EC 81 MG tablet Take 81 mg by mouth in the morning.     bisoprolol (ZEBETA) 5 MG tablet Take 0.5 tablets (2.5 mg total) by mouth daily. 45 tablet 3   Cholecalciferol 25 MCG (1000 UT)  tablet Take 1,000 Units by mouth in the morning.     DULoxetine (CYMBALTA) 20 MG capsule TAKE 1 CAPSULE(20 MG) BY MOUTH DAILY 30 capsule 3   empagliflozin (JARDIANCE) 10 MG TABS tablet Take 1 tablet (10 mg total) by mouth every morning. 90 tablet 0   furosemide (LASIX) 40 MG tablet Take 40 mg by mouth daily.     lubiprostone (AMITIZA) 8 MCG capsule Take 8 mcg by mouth 2 (two) times daily with a meal.     methocarbamol (ROBAXIN) 750 MG tablet Take 750 mg by mouth at bedtime.     nitroGLYCERIN (NITROSTAT) 0.4 MG SL tablet Place 1 tablet (0.4 mg total) under the tongue every 5 (five) minutes as needed for chest pain. If you require more than two  tablets five minutes apart go to the nearest ER via EMS. 30 tablet 0   potassium chloride (KLOR-CON) 10 MEQ tablet Take 2 tablets (20 mEq total) by mouth daily. 180 tablet 3   pyridoxine (B-6) 100 MG tablet Take 100 mg by mouth in the morning.     rOPINIRole (REQUIP) 0.5 MG tablet Take 1.5 mg by mouth in the morning and at bedtime. In the afternoon & in the evening.     rosuvastatin (CRESTOR) 40 MG tablet Take 20 mg by mouth at bedtime.     sacubitril-valsartan (ENTRESTO) 24-26 MG Take 1 tablet by mouth 2 (two) times daily. 60 tablet 11   spironolactone (ALDACTONE) 25 MG tablet TAKE 1/2 TABLET(12.5 MG) BY MOUTH DAILY 45 tablet 2   tadalafil (CIALIS) 20 MG tablet Take 20 mg by mouth daily as needed for erectile dysfunction.     tamsulosin (FLOMAX) 0.4 MG CAPS capsule Take 1 capsule (0.4 mg total) by mouth in the morning and at bedtime. 60 capsule 11   No current facility-administered medications for this encounter.   BP (!) 100/50   Pulse 100   Wt 85.6 kg (188 lb 12.8 oz)   SpO2 96%   BMI 27.88 kg/m  General: NAD Neck: No JVD, no thyromegaly or thyroid nodule.  Lungs: Clear to auscultation bilaterally with normal respiratory effort. CV: Nondisplaced PMI.  Heart regular S1/S2, no S3/S4, no murmur.  No peripheral edema.  No carotid bruit.  Normal pedal pulses.  Abdomen: Soft, nontender, no hepatosplenomegaly, no distention.  Skin: Intact without lesions or rashes.  Neurologic: Alert and oriented x 3.  Psych: Normal affect. Extremities: No clubbing or cyanosis.  HEENT: Normal.   Assessment/Plan: 1. Chronic HF with mid range EF:  Most recent echo in 6/23 showed EF 40-45%, mild LVH, grade 2 diastolic dysfunction, RV normal, severe LAE, mild-moderate MR.  He has a history of CAD with PCI to proximal RCA in 2017, but I do not think that CAD/ischemic cardiomyopathy is a definite explanation for his cardiomyopathy/CHF.  Coronary angiography in 7/23 showed patent RCA stent and nonobstructive mild  CAD.  RHC in 8/23 after augmented diuresis showed normal filling pressures and cardiac output.  CPX in 8/23 showed only mild functional limitation from CHF. He seems to be doing better, NYHA class II.  He is not volume overloaded on exam.  BP low but not markedly so.  - Unable to obtain cardiac MRI due to spinal stimulator.  - He is taking hydralazine once daily, can stop this.   - Add bisoprolol 2.5 mg daily, he was lightheaded with Coreg.  - Continue Lasix 40 mg daily, BMET/BNP today.  - Continue current Jardiance, Entresto, and spironolactone. Will increase spironolactone  to 25 mg daily at next appt if BP remains stable.  2. CAD: 2017 PCI to RCA.  Cath 7/23 with patent RCA stent and nonobstructive mild disease.  This does not explain his symptoms/volume overload.  - Continue ASA 81 daily.  - Continue Crestor 20 mg daily.   Followup with HF pharmacist in 3 wks and me in 3 months.  Loralie Champagne 03/21/2022

## 2022-03-29 ENCOUNTER — Other Ambulatory Visit: Payer: Self-pay | Admitting: Urology

## 2022-04-04 ENCOUNTER — Ambulatory Visit (INDEPENDENT_AMBULATORY_CARE_PROVIDER_SITE_OTHER): Payer: Medicare PPO | Admitting: Internal Medicine

## 2022-04-04 DIAGNOSIS — R0602 Shortness of breath: Secondary | ICD-10-CM | POA: Diagnosis not present

## 2022-04-04 LAB — PULMONARY FUNCTION TEST
DL/VA % pred: 86 %
DL/VA: 3.43 ml/min/mmHg/L
DLCO cor % pred: 69 %
DLCO cor: 16.86 ml/min/mmHg
DLCO unc % pred: 69 %
DLCO unc: 16.86 ml/min/mmHg
FEF 25-75 Post: 4.15 L/sec
FEF 25-75 Pre: 3.13 L/sec
FEF2575-%Change-Post: 32 %
FEF2575-%Pred-Post: 199 %
FEF2575-%Pred-Pre: 150 %
FEV1-%Change-Post: 7 %
FEV1-%Pred-Post: 95 %
FEV1-%Pred-Pre: 88 %
FEV1-Post: 2.77 L
FEV1-Pre: 2.57 L
FEV1FVC-%Change-Post: 2 %
FEV1FVC-%Pred-Pre: 115 %
FEV6-%Change-Post: 4 %
FEV6-%Pred-Post: 85 %
FEV6-%Pred-Pre: 81 %
FEV6-Post: 3.21 L
FEV6-Pre: 3.08 L
FEV6FVC-%Pred-Post: 106 %
FEV6FVC-%Pred-Pre: 106 %
FVC-%Change-Post: 4 %
FVC-%Pred-Post: 80 %
FVC-%Pred-Pre: 76 %
FVC-Post: 3.23 L
FVC-Pre: 3.08 L
Post FEV1/FVC ratio: 86 %
Post FEV6/FVC ratio: 100 %
Pre FEV1/FVC ratio: 83 %
Pre FEV6/FVC Ratio: 100 %
RV % pred: 79 %
RV: 2.01 L
TLC % pred: 72 %
TLC: 4.98 L

## 2022-04-04 NOTE — Progress Notes (Signed)
Full PFT performed today. °

## 2022-04-04 NOTE — Patient Instructions (Signed)
Full PFT performed today.l 

## 2022-04-11 ENCOUNTER — Ambulatory Visit (HOSPITAL_COMMUNITY)
Admission: RE | Admit: 2022-04-11 | Discharge: 2022-04-11 | Disposition: A | Discharge status: Home / Self Care | Payer: Medicare PPO | Source: Ambulatory Visit | Attending: Cardiology | Admitting: Cardiology

## 2022-04-11 VITALS — BP 112/76 | HR 57 | Wt 187.4 lb

## 2022-04-11 DIAGNOSIS — Z7982 Long term (current) use of aspirin: Secondary | ICD-10-CM | POA: Diagnosis not present

## 2022-04-11 DIAGNOSIS — I5042 Chronic combined systolic (congestive) and diastolic (congestive) heart failure: Secondary | ICD-10-CM | POA: Insufficient documentation

## 2022-04-11 DIAGNOSIS — I119 Hypertensive heart disease without heart failure: Secondary | ICD-10-CM | POA: Diagnosis not present

## 2022-04-11 DIAGNOSIS — I5022 Chronic systolic (congestive) heart failure: Secondary | ICD-10-CM

## 2022-04-11 DIAGNOSIS — I255 Ischemic cardiomyopathy: Secondary | ICD-10-CM | POA: Diagnosis not present

## 2022-04-11 MED ORDER — SPIRONOLACTONE 25 MG PO TABS: 25.0000 mg | ORAL_TABLET | Freq: Every day | ORAL | 2 refills | Status: DC

## 2022-04-11 NOTE — Patient Instructions (Signed)
It was a pleasure seeing you today!  MEDICATIONS: -We are changing your medications today -Increase spironolactone to 1 tablet (25 mg daily) -Hold potassium supplement for now. We will contact you next Tuesday with further instructions.  -Call if you have questions about your medications.  NEXT APPOINTMENT: Return to clinic on 04/18/2022 for a lab visit.  In general, to take care of your heart failure: -Limit your fluid intake to 2 Liters (half-gallon) per day.   -Limit your salt intake to ideally 2-3 grams (2000-3000 mg) per day. -Weigh yourself daily and record, and bring that "weight diary" to your next appointment.  (Weight gain of 2-3 pounds in 1 day typically means fluid weight.) -The medications for your heart are to help your heart and help you live longer.   -Please contact us before stopping any of your heart medications.  Call the clinic at [phone] with questions or to reschedule future appointments.

## 2022-04-11 NOTE — Progress Notes (Signed)
Advanced Heart Failure Clinic Note   PCP: Kathyrn Lass, MD HF Cardiology: Dr. Aundra Dubin   HPI:  76 y.o. with history of HTN, hyperlipidemia, and CAD was referred by Dr. Terri Skains for evaluation of CHF. Patient had a cath in 10/2015 with DES to 90% proximal RCA stenosis. Echoes after that time showed mildly decreased systolic function in the 63-01% range. He was generally asymptomatic, however, until around 12/2021. In 12/2021, he developed progressive dyspnea and peripheral edema. Echo in 12/2021 showed EF 40-45%, mild LVH, grade 2 diastolic dysfunction, RV normal, severe LAE, mild-moderate MR. He was started on Bumex which gave him headaches, this was switched to Lasix. Peripheral edema improved but he continued to feel short of breath with exertion. No definite trigger for symptoms, no episode of prolonged chest pain or severe viral-type illness.  He had coronary angiography in 12/2021, showing patent RCA stent and nonobstructive mild CAD. He drank heavily in the past but not in the last 5 years (3-4 glasses wine/week now). He has a family history of CAD (father at around 4) but not cardiomyopathy or sudden cardiac death.  He was noted to be volume overloaded and Lasix was increased.    RHC in 01/2022 showed normal filling pressures on higher Lasix dose. CPX in 01/2022 showed low normal functional capacity, no more than mild functional limitation due to heart failure. PFTs were within normal limits.    Returned for CHF follow up on 03/21/2022. He had had trouble tolerating medications due to orthostasis. BP was low at 100/50 mmHg but he was only rarely lightheaded with standing. He was doing better overall.  Weight was down 3 lbs.  He was short of breath with long walks. He was able to play golf. No orthopnea/PND or chest pain. Main complaints were low back pain and headaches. He has chronic low back pain and has a spinal stimulator.   Today he returns to HF clinic for pharmacist medication titration. At last  visit with MD, patient was instructed to stop hydralazine and start bisoprolol 2.5 mg daily. Overall feeling fine. Reports home BP this morning was 118/75 mmHg. HR has decreased to ~80 bpm since starting bisoprolol, denies HR <60 at home. Reports occasional lightheadness (2x/month) with standing. Denies dizziness, lightheadedness, fatigue, chest pain or palpitations. Endorses he has not noticed any side effects from bisoprolol. Breathing is unchanged from last visit. He played golf yesterday, typically goes twice a week. Only has SOB with hilly courses, does fine on flat ground. Weight at home stable at 182-184 pounds. Takes Lasix 40 mg daily and has not needed any additional doses. No LEE. Denies PND/Orthopnea.  HF Medications: Bisprolol 2.5 mg daily Entresto 24/26 mg BID Spironolactone 12.5 mg daily Jardiance 10 mg daily Lasix 40 mg daily + KCl 20 mEq daily  Has the patient been experiencing any side effects to the medications prescribed?  No  Does the patient have any problems obtaining medications due to transportation or finances?   No; [employer]  Understanding of regimen: good Understanding of indications: good Potential of compliance: good Patient understands to avoid NSAIDs. Patient understands to avoid decongestants.    Pertinent Lab Values: (03/21/2022) Serum creatinine 1.14, BUN 13, Potassium 4.5, Sodium 138, BNP 450.2  Vital Signs: Weight: 187.4 lbs (last clinic weight: 188.8 lbs) Blood pressure: 112/76 mmHg  Heart rate: 57 bpm   Assessment/Plan: 1. Chronic HF with mid range EF:  Most recent echo in 12/2021 showed EF 40-45%, mild LVH, grade 2 diastolic  dysfunction, RV normal, severe LAE, mild-moderate MR. He has a history of CAD with PCI to proximal RCA in 2017, but it is not believed that CAD/ischemic cardiomyopathy is a definite explanation for his cardiomyopathy/CHF. Coronary angiography in 12/2021 showed patent RCA stent and nonobstructive mild CAD. RHC in 01/2022 after  augmented diuresis showed normal filling pressures and cardiac output. CPX in 01/2022 showed only mild functional limitation from CHF.  - NYHA class II. He is not volume overloaded on exam.  - Continue Lasix 40 mg daily, BMET/BNP stable 03/21/2022. - Continue bisoprolol 2.5 mg daily.  - Continue Entresto 24/26 mg BID. - Increase spironolactone to 25 mg daily. Stop KCL supplement. BMET in 7 days.  - Continue Jardiance 10 mg daily. - Unable to obtain cardiac MRI due to spinal stimulator.   2. CAD: 2017 PCI to RCA.  Cath 12/2021 with patent RCA stent and nonobstructive mild disease. - Continue ASA 81 mg daily.  - Continue Crestor 20 mg daily.   Follow up in 7 days for lab visit and 06/20/2022 with Dr. Rush Farmer, PharmD, BCPS, [facility], [facility] Pharmacist [phone]

## 2022-04-18 ENCOUNTER — Telehealth (HOSPITAL_COMMUNITY): Payer: Self-pay

## 2022-04-18 ENCOUNTER — Ambulatory Visit (HOSPITAL_COMMUNITY)
Admission: RE | Admit: 2022-04-18 | Discharge: 2022-04-18 | Disposition: A | Discharge status: Home / Self Care | Payer: Medicare PPO | Source: Ambulatory Visit | Attending: Internal Medicine | Admitting: Internal Medicine

## 2022-04-18 DIAGNOSIS — I5042 Chronic combined systolic (congestive) and diastolic (congestive) heart failure: Secondary | ICD-10-CM

## 2022-04-18 LAB — BASIC METABOLIC PANEL
Anion gap: 7 (ref 5–15)
BUN: 13 mg/dL (ref 8–23)
CO2: 29 mmol/L (ref 22–32)
Calcium: 9.3 mg/dL (ref 8.9–10.3)
Chloride: 104 mmol/L (ref 98–111)
Creatinine, Ser: 1.01 mg/dL (ref 0.61–1.24)
GFR, Estimated: >60 mL/min (ref >60)
Glucose, Bld: 95 mg/dL (ref 70–99)
Potassium: 4 mmol/L (ref 3.5–5.1)
Sodium: 140 mmol/L (ref 135–145)

## 2022-04-18 NOTE — Telephone Encounter (Signed)
Contacted patient to discuss lab results from today- K is stable at 4.0. He should continue to NOT take KCl supplementation. Patient endorsed understanding.   Reports he appropriately increased his spironolactone to 25 mg daily and endorses tolerating this well. He was able to go out of town last weekend with no issues.   Joseph Art, Pharm.D. PGY-2 Ambulatory Care Pharmacy Resident 04/18/2022 2:37 PM

## 2022-04-19 ENCOUNTER — Encounter: Payer: Self-pay | Admitting: Internal Medicine

## 2022-04-20 NOTE — Telephone Encounter (Signed)
PFT showed some mild bronchial hyper-responsiveness which means that using an as needed albuterol inhaler could be helpful if he still has ongoing shortness of breath. Otherwise unremarkable. He was feeling better when he saw me hence we did not start any inhaler therapy. If he is still having symptoms of shortness of breath can have him try an albuterol inhaler and be seen in follow up with APP.

## 2022-04-26 ENCOUNTER — Ambulatory Visit: Payer: Medicare PPO | Admitting: Cardiology

## 2022-04-27 ENCOUNTER — Ambulatory Visit: Payer: Medicare PPO | Admitting: Cardiology

## 2022-06-11 ENCOUNTER — Other Ambulatory Visit: Payer: Self-pay | Admitting: Cardiology

## 2022-06-11 DIAGNOSIS — I5041 Acute combined systolic (congestive) and diastolic (congestive) heart failure: Secondary | ICD-10-CM

## 2022-06-20 ENCOUNTER — Encounter (HOSPITAL_COMMUNITY): Payer: Self-pay | Admitting: Cardiology

## 2022-06-20 ENCOUNTER — Ambulatory Visit (HOSPITAL_COMMUNITY)
Admission: RE | Admit: 2022-06-20 | Discharge: 2022-06-20 | Disposition: A | Discharge status: Home / Self Care | Payer: Medicare PPO | Source: Ambulatory Visit | Attending: Cardiology | Admitting: Cardiology

## 2022-06-20 VITALS — BP 110/70 | HR 68 | Wt 194.4 lb

## 2022-06-20 DIAGNOSIS — I110 Hypertensive heart disease with heart failure: Secondary | ICD-10-CM | POA: Diagnosis not present

## 2022-06-20 DIAGNOSIS — Z7982 Long term (current) use of aspirin: Secondary | ICD-10-CM | POA: Diagnosis not present

## 2022-06-20 DIAGNOSIS — I2510 Atherosclerotic heart disease of native coronary artery without angina pectoris: Secondary | ICD-10-CM | POA: Diagnosis not present

## 2022-06-20 DIAGNOSIS — I5022 Chronic systolic (congestive) heart failure: Secondary | ICD-10-CM | POA: Diagnosis present

## 2022-06-20 DIAGNOSIS — Z79899 Other long term (current) drug therapy: Secondary | ICD-10-CM | POA: Insufficient documentation

## 2022-06-20 DIAGNOSIS — Z7984 Long term (current) use of oral hypoglycemic drugs: Secondary | ICD-10-CM | POA: Diagnosis not present

## 2022-06-20 DIAGNOSIS — E785 Hyperlipidemia, unspecified: Secondary | ICD-10-CM | POA: Insufficient documentation

## 2022-06-20 DIAGNOSIS — I5042 Chronic combined systolic (congestive) and diastolic (congestive) heart failure: Secondary | ICD-10-CM | POA: Insufficient documentation

## 2022-06-20 DIAGNOSIS — Z955 Presence of coronary angioplasty implant and graft: Secondary | ICD-10-CM | POA: Insufficient documentation

## 2022-06-20 DIAGNOSIS — I255 Ischemic cardiomyopathy: Secondary | ICD-10-CM | POA: Insufficient documentation

## 2022-06-20 LAB — BASIC METABOLIC PANEL
Anion gap: 8 (ref 5–15)
BUN: 12 mg/dL (ref 8–23)
CO2: 27 mmol/L (ref 22–32)
Calcium: 9.2 mg/dL (ref 8.9–10.3)
Chloride: 103 mmol/L (ref 98–111)
Creatinine, Ser: 1.11 mg/dL (ref 0.61–1.24)
GFR, Estimated: >60 mL/min (ref >60)
Glucose, Bld: 107 mg/dL — ABNORMAL HIGH (ref 70–99)
Potassium: 4.4 mmol/L (ref 3.5–5.1)
Sodium: 138 mmol/L (ref 135–145)

## 2022-06-20 LAB — LIPID PANEL
Cholesterol: 153 mg/dL (ref 0–200)
HDL: 69 mg/dL (ref >40)
LDL Cholesterol: 62 mg/dL (ref 0–99)
Total CHOL/HDL Ratio: 2.2 RATIO
Triglycerides: 108 mg/dL (ref <150)
VLDL: 22 mg/dL (ref 0–40)

## 2022-06-20 MED ORDER — ENTRESTO 49-51 MG PO TABS: 1.0000 | ORAL_TABLET | Freq: Two times a day (BID) | ORAL | 3 refills | Status: DC

## 2022-06-20 NOTE — Patient Instructions (Signed)
INCREASE Entresto to 49/51mg  Twice daily  Labs done today, your results will be available in MyChart, we will contact you for abnormal readings.  Repeat blood work in 10 days  Your physician recommends that you schedule a follow-up appointment in: 4 months  If you have any questions or concerns before your next appointment please send [place] a message through [place] or call our office at [phone].    TO LEAVE A MESSAGE FOR THE NURSE SELECT OPTION 2, PLEASE LEAVE A MESSAGE INCLUDING: YOUR NAME DATE OF BIRTH CALL BACK NUMBER REASON FOR CALL**this is important as we prioritize the call backs  YOU WILL RECEIVE A CALL BACK THE SAME DAY AS LONG AS YOU CALL BEFORE 4:00 PM  At the [facility], you and your health needs are our priority. As part of our continuing mission to provide you with exceptional heart care, we have created designated Provider Care Teams. These Care Teams include your primary Cardiologist (physician) and Advanced Practice Providers (APPs- Physician Assistants and Nurse Practitioners) who all work together to provide you with the care you need, when you need it.   You may see any of the following providers on your designated Care Team at your next follow up: Dr Arvilla Meres Dr Marca Ancona Dr. Marcos Eke, NP Robbie Lis, [place] [facility] [place] [facility], [place] Brynda Peon, NP Karle Plumber, PharmD   Please be sure to bring in all your medications bottles to every appointment.

## 2022-06-20 NOTE — Progress Notes (Signed)
PCP: Sigmund Hazel, MD HF Cardiology: Dr. Shirlee Latch  76 y.o. with history of HTN, hyperlipidemia, and CAD was referred by Dr. Odis Hollingshead for evaluation of CHF.  Patient had a cath in 4/17 with DES to 90% proximal RCA stenosis.  Echoes after that time showed mildly decreased systolic function in the 45-50% range.  He was generally asymptomatic, however, until around 6/23.  In 6/23, he developed progressive dyspnea and peripheral edema.  Echo in 6/23 showed EF 40-45%, mild LVH, grade 2 diastolic dysfunction, RV normal, severe LAE, mild-moderate MR.  He was started on Bumex which gave him headaches, this was switched to Lasix.  Peripheral edema improved but he has continued to feel short of breath with exertion.  No definite trigger for symptoms, no episode of prolonged chest pain or severe viral-type illness.  He had coronary angiography in 7/23, showing patent RCA stent and nonobstructive mild CAD. He drank heavily in the past but not in the last 5 years (3-4 glasses wine/week now). He has a family history of CAD (father at around 49) but not cardiomyopathy or sudden cardiac death.  He was noted to be volume overloaded and Lasix was increased.   RHC in 8/23 showed normal filling pressures on higher Lasix dose.  CPX in 8/23 showed low normal functional capacity, no more than mild functional limitation due to heart failure.  PFTs were within normal limits.   He returns today for followup of CHF.  He has been doing well overall.  Main complaint is right hip pain, to see orthopedic MD soon.  No chest pain. No dyspnea walking on flat ground and doing ok with a flight of stairs.  No orthopnea/PND.  No palpitations.  No longer with lightheaded episodes. Weight up about 6 lbs.  Exercising less and eating more.   ECG (personally reviewed): NSR, nonspecific T wave changes.   Labs (6/23): pro-BNP 2381 Labs (7/23): K 4.5, creatinine 1.03, pro-BNP 1455 Labs (9/23): BNP 450 Labs (10/23): K 4, creatinine 1.01  PMH: 1.  HTN 2. Hyperlipidemia 3. CAD: LHC in 4/17 with 90% pRCA treated with DES.  - LHC (7/23): Patent RCA stent, mild nonobstructive disease.  4. Chronic HF with mid range EF: ?Ischemic cardiomyopathy.  - Echo (3/18): EF 50%.  - Echo (7/21): EF 45-50%, septal HK, moderate MR - Echo (6/23): EF 40-45%, mild LVH, grade 2 diastolic dysfunction, RV normal, severe LAE, mild-moderate MR.  - RHC (8/23): mean RA 1, PA 33/13 mean 22, mean PCWP 11, CI 4.08 - CPX (8/23): peak VO2 18.9, VE/VCO2 slope 34, RER 1.22, normal PFTs => low normal functional status, no more than mild HF limitation.   SH: Married x 2, now widower.  Has 2 children.  Lives in [place].  Designer, television/film set.  Drinks 3-4 glasses wine/week. Nonsmoker though exposed to 2nd hand smoke.   FH: Father with MI in his 61s, mother with brain aneurysm in her 19s.   Current Outpatient Medications  Medication Sig Dispense Refill   acetaminophen (TYLENOL) 500 MG tablet Take 500 mg by mouth every 6 (six) hours as needed (headache.).     aspirin EC 81 MG tablet Take 81 mg by mouth in the morning.     bisoprolol (ZEBETA) 5 MG tablet Take 0.5 tablets (2.5 mg total) by mouth daily. 45 tablet 3   Cholecalciferol 25 MCG (1000 UT) tablet Take 1,000 Units by mouth in the morning.     DULoxetine (CYMBALTA) 20 MG capsule TAKE 1 CAPSULE(20 MG) BY MOUTH DAILY  30 capsule 3   furosemide (LASIX) 40 MG tablet Take 40 mg by mouth daily.     JARDIANCE 10 MG TABS tablet TAKE 1 TABLET(10 MG) BY MOUTH EVERY MORNING 90 tablet 0   lubiprostone (AMITIZA) 8 MCG capsule Take 8 mcg by mouth 2 (two) times daily with a meal.     methocarbamol (ROBAXIN) 750 MG tablet Take 750 mg by mouth at bedtime.     nitroGLYCERIN (NITROSTAT) 0.4 MG SL tablet Place 1 tablet (0.4 mg total) under the tongue every 5 (five) minutes as needed for chest pain. If you require more than two tablets five minutes apart go to the nearest ER via EMS. 30 tablet 0   pyridoxine (B-6) 100 MG tablet Take 100 mg  by mouth in the morning.     rOPINIRole (REQUIP) 2 MG tablet Take 2 mg by mouth in the morning, at noon, and at bedtime.     rosuvastatin (CRESTOR) 40 MG tablet Take 20 mg by mouth at bedtime.     sacubitril-valsartan (ENTRESTO) 49-51 MG Take 1 tablet by mouth 2 (two) times daily. 180 tablet 3   spironolactone (ALDACTONE) 25 MG tablet Take 1 tablet (25 mg total) by mouth daily. 45 tablet 2   tadalafil (CIALIS) 20 MG tablet Take 20 mg by mouth daily as needed for erectile dysfunction.     tamsulosin (FLOMAX) 0.4 MG CAPS capsule TAKE 1 CAPSULE(0.4 MG) BY MOUTH IN THE MORNING AND AT BEDTIME 60 capsule 11   traZODone (DESYREL) 100 MG tablet Take 100 mg by mouth at bedtime.     No current facility-administered medications for this encounter.   BP 110/70   Pulse 68   Wt 88.2 kg (194 lb 6.4 oz)   SpO2 95%   BMI 28.71 kg/m  General: NAD Neck: No JVD, no thyromegaly or thyroid nodule.  Lungs: Clear to auscultation bilaterally with normal respiratory effort. CV: Nondisplaced PMI.  Heart regular S1/S2, no S3/S4, no murmur.  No peripheral edema.  No carotid bruit.  Normal pedal pulses.  Abdomen: Soft, nontender, no hepatosplenomegaly, no distention.  Skin: Intact without lesions or rashes.  Neurologic: Alert and oriented x 3.  Psych: Normal affect. Extremities: No clubbing or cyanosis.  HEENT: Normal.   Assessment/Plan: 1. Chronic HF with mid range EF:  Most recent echo in 6/23 showed EF 40-45%, mild LVH, grade 2 diastolic dysfunction, RV normal, severe LAE, mild-moderate MR.  He has a history of CAD with PCI to proximal RCA in 2017, but I do not think that CAD/ischemic cardiomyopathy is a definite explanation for his cardiomyopathy/CHF.  Coronary angiography in 7/23 showed patent RCA stent and nonobstructive mild CAD.  RHC in 8/23 after augmented diuresis showed normal filling pressures and cardiac output.  CPX in 8/23 showed only mild functional limitation from CHF. NYHA class II, not volume  overloaded.   - Unable to obtain cardiac MRI due to spinal stimulator.  - Continue bisoprolol 2.5 mg daily.  - Continue Lasix 40 mg daily, BMET/BNP today.  - Continue Jardiance 10 mg daily and spironolactone 25 mg daily.  - Increase Entresto to 49/51 bid with BMET in 10 days.  2. CAD: 2017 PCI to RCA.  Cath 7/23 with patent RCA stent and nonobstructive mild disease.  No chest pain.  - Continue ASA 81 daily.  - Continue Crestor 20 mg daily. Check lipids.   Followup 4 months with APP.  Loralie Champagne 06/20/2022

## 2022-06-29 ENCOUNTER — Ambulatory Visit (HOSPITAL_COMMUNITY)
Admission: RE | Admit: 2022-06-29 | Discharge: 2022-06-29 | Disposition: A | Discharge status: Home / Self Care | Payer: Medicare PPO | Source: Ambulatory Visit | Attending: Internal Medicine | Admitting: Internal Medicine

## 2022-06-29 DIAGNOSIS — I5042 Chronic combined systolic (congestive) and diastolic (congestive) heart failure: Secondary | ICD-10-CM | POA: Diagnosis not present

## 2022-06-29 LAB — BASIC METABOLIC PANEL
Anion gap: 6 (ref 5–15)
BUN: 17 mg/dL (ref 8–23)
CO2: 29 mmol/L (ref 22–32)
Calcium: 9.1 mg/dL (ref 8.9–10.3)
Chloride: 104 mmol/L (ref 98–111)
Creatinine, Ser: 1.09 mg/dL (ref 0.61–1.24)
GFR, Estimated: >60 mL/min (ref >60)
Glucose, Bld: 89 mg/dL (ref 70–99)
Potassium: 4.1 mmol/L (ref 3.5–5.1)
Sodium: 139 mmol/L (ref 135–145)

## 2022-06-30 ENCOUNTER — Other Ambulatory Visit (HOSPITAL_COMMUNITY): Payer: Medicare PPO

## 2022-07-06 ENCOUNTER — Encounter: Payer: Self-pay | Admitting: Urology

## 2022-07-11 ENCOUNTER — Ambulatory Visit: Payer: Medicare PPO | Admitting: Orthopaedic Surgery

## 2022-07-11 ENCOUNTER — Other Ambulatory Visit: Payer: Self-pay

## 2022-07-11 ENCOUNTER — Ambulatory Visit (INDEPENDENT_AMBULATORY_CARE_PROVIDER_SITE_OTHER): Payer: Medicare PPO

## 2022-07-11 ENCOUNTER — Encounter: Payer: Self-pay | Admitting: Orthopaedic Surgery

## 2022-07-11 DIAGNOSIS — M25551 Pain in right hip: Secondary | ICD-10-CM

## 2022-07-11 MED ORDER — TADALAFIL 5 MG PO TABS: 5.0000 mg | ORAL_TABLET | Freq: Every day | ORAL | 11 refills | Status: DC

## 2022-07-11 MED ORDER — METHYLPREDNISOLONE ACETATE 40 MG/ML IJ SUSP
40.0000 mg | INTRAMUSCULAR | Status: AC | PRN
  Administered 2022-07-11: 40 mg via INTRA_ARTICULAR

## 2022-07-11 MED ORDER — LIDOCAINE HCL 1 % IJ SOLN
3.0000 mL | INTRAMUSCULAR | Status: AC | PRN
  Administered 2022-07-11: 3 mL

## 2022-07-11 MED ORDER — GABAPENTIN 300 MG PO CAPS: 300.0000 mg | ORAL_CAPSULE | Freq: Every day | ORAL | 1 refills | Status: DC

## 2022-07-11 NOTE — Progress Notes (Signed)
The patient comes in today with mainly right hip pain.  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  Treatment in the office today.  He is 77 years old and not a diabetic.  He is a former patient of Dr. Louanne Skye and has had at least 7 operations as a relates to the spine.  He also has a spinal cord stimulator.  He used to take gabapentin but has not been on that for over a year now.  He is an avid golfer as well and is actually had in the [place] next week.  He points to the trochanteric area of his right hip as a source of his pain.  He denies any groin pain.  On exam his right and left hips move smoothly and fluidly.  His pain is only palpation over the tip of the trochanteric area.  I did show him stretching exercises to try and recommended a steroid injection over this area.  An AP pelvis and lateral the right hip shows normal-appearing hips bilaterally with no cortical irregularities and no joint space narrowing.  I did place a steroid injection of his right hip trochanteric area which she tolerated well.  For his neuropathy we will start him on gabapentin 300 mg to take just at night.  I would like to see him back in 4 weeks to see how he is doing overall.  At that visit we will have a standing AP and lateral of his right knee.  He has had a left knee replacement before and his right knee is starting to bother him significantly even when playing golf.      Procedure Note  Patient: Brian Gallegos             Date of Birth: 28-Nov-1945           MRN: [id number]             Visit Date: 07/11/2022  Procedures: Visit Diagnoses:  1. Pain of right hip     Large Joint Inj: R greater trochanter on 07/11/2022 10:01 AM Indications: pain and diagnostic evaluation Details: 22 G 1.5 in needle, lateral approach  Arthrogram: No  Medications: 3 mL lidocaine 1 %; 40 mg methylPREDNISolone acetate 40 MG/ML Outcome: tolerated well, no immediate  complications Procedure, treatment alternatives, risks and benefits explained, specific risks discussed. Consent was given by the patient. Immediately prior to procedure a time out was called to verify the correct patient, procedure, equipment, support staff and site/side marked as required. Patient was prepped and draped in the usual sterile fashion.

## 2022-07-26 DIAGNOSIS — Z6828 Body mass index (BMI) 28.0-28.9, adult: Secondary | ICD-10-CM | POA: Diagnosis not present

## 2022-07-26 DIAGNOSIS — R195 Other fecal abnormalities: Secondary | ICD-10-CM | POA: Diagnosis not present

## 2022-07-26 DIAGNOSIS — J208 Acute bronchitis due to other specified organisms: Secondary | ICD-10-CM | POA: Diagnosis not present

## 2022-08-02 DIAGNOSIS — L570 Actinic keratosis: Secondary | ICD-10-CM | POA: Diagnosis not present

## 2022-08-02 DIAGNOSIS — X32XXXD Exposure to sunlight, subsequent encounter: Secondary | ICD-10-CM | POA: Diagnosis not present

## 2022-08-02 DIAGNOSIS — Z85820 Personal history of malignant melanoma of skin: Secondary | ICD-10-CM | POA: Diagnosis not present

## 2022-08-02 DIAGNOSIS — Z08 Encounter for follow-up examination after completed treatment for malignant neoplasm: Secondary | ICD-10-CM | POA: Diagnosis not present

## 2022-08-02 DIAGNOSIS — D225 Melanocytic nevi of trunk: Secondary | ICD-10-CM | POA: Diagnosis not present

## 2022-08-02 DIAGNOSIS — Z1283 Encounter for screening for malignant neoplasm of skin: Secondary | ICD-10-CM | POA: Diagnosis not present

## 2022-08-04 ENCOUNTER — Ambulatory Visit: Payer: Medicare PPO | Admitting: Urology

## 2022-08-04 ENCOUNTER — Encounter: Payer: Self-pay | Admitting: Urology

## 2022-08-04 VITALS — BP 109/55 | HR 73

## 2022-08-04 DIAGNOSIS — N138 Other obstructive and reflux uropathy: Secondary | ICD-10-CM

## 2022-08-04 DIAGNOSIS — N401 Enlarged prostate with lower urinary tract symptoms: Secondary | ICD-10-CM

## 2022-08-04 DIAGNOSIS — N5201 Erectile dysfunction due to arterial insufficiency: Secondary | ICD-10-CM | POA: Diagnosis not present

## 2022-08-04 DIAGNOSIS — R339 Retention of urine, unspecified: Secondary | ICD-10-CM | POA: Diagnosis not present

## 2022-08-04 LAB — URINALYSIS, ROUTINE W REFLEX MICROSCOPIC
Bilirubin, UA: NEGATIVE
Ketones, UA: NEGATIVE
Leukocytes,UA: NEGATIVE
Nitrite, UA: NEGATIVE
Protein,UA: NEGATIVE
RBC, UA: NEGATIVE
Specific Gravity, UA: 1.01 (ref 1.005–1.030)
Urobilinogen, Ur: 0.2 mg/dL (ref 0.2–1.0)
pH, UA: 5.5 (ref 5.0–7.5)

## 2022-08-04 MED ORDER — TADALAFIL 5 MG PO TABS
5.0000 mg | ORAL_TABLET | Freq: Every day | ORAL | 11 refills | Status: DC
Start: 2022-08-04 — End: 2023-02-21

## 2022-08-04 MED ORDER — TADALAFIL 20 MG PO TABS: 20.0000 mg | ORAL_TABLET | Freq: Every day | ORAL | 3 refills | Status: DC | PRN

## 2022-08-04 NOTE — Progress Notes (Signed)
08/04/2022 10:24 AM   Brian Gallegos 1946-03-14 [id number]  Referring provider: Kathyrn Lass, MD [place],  [place] [address]  Followup BPh and erectile dysfunction   HPI: Brian Gallegos is a 77yo here for followup for erectile dysfunction and BPH. IPSS 5 QOL 1 after urolift. He could not do trimix due to pain. He is interested in daily cilais. Cialis worked well for him. Good libido. No other complaints today    PMH: Past Medical History:  Diagnosis Date   Arthritis    "all over"   CHF (congestive heart failure) (HCC)    Chronic lower back pain    Coronary artery disease    Early cataracts, bilateral    Family history of adverse reaction to anesthesia    "son had ligament repair of elbow in 2016; woke up nauseated and disoriented"   Hypercholesterolemia    Hypertension    Migraine    "none in years" (10/12/2015)   Restless leg syndrome    Wears glasses     Surgical History: Past Surgical History:  Procedure Laterality Date   ANTERIOR CERVICAL DECOMP/DISCECTOMY FUSION  ~ 2005   ANTERIOR CERVICAL DISCECTOMY     BACK SURGERY     CARDIAC CATHETERIZATION N/A 10/12/2015   Procedure: Left Heart Cath and Coronary Angiography;  Surgeon: Adrian Prows, MD;  Location: [facility];  Service: Cardiovascular;  Laterality: N/A;   CARDIAC CATHETERIZATION  10/12/2015   Procedure: Coronary Stent Intervention;  Surgeon: Adrian Prows, MD;  Location: [facility];  Service: Cardiovascular;;   COLONOSCOPY     CORONARY ANGIOPLASTY  10/12/2015   DES RCA   CYSTOSCOPY WITH INSERTION OF UROLIFT N/A 05/02/2021   Procedure: CYSTOSCOPY WITH INSERTION OF UROLIFT;  Surgeon: Cleon Gustin, MD;  Location: [facility];  Service: Urology;  Laterality: N/A;   EYE SURGERY Left    cataract   FRACTURE SURGERY     nose repaired from accident   GLAUCOMA SURGERY Left 03/04/2015   JOINT REPLACEMENT     KNEE ARTHROSCOPY Bilateral    LEFT HEART CATH AND CORONARY ANGIOGRAPHY N/A  01/17/2022   Procedure: LEFT HEART CATH AND CORONARY ANGIOGRAPHY;  Surgeon: Adrian Prows, MD;  Location: [facility];  Service: Cardiovascular;  Laterality: N/A;   LUMBAR DISC SURGERY  ~ 1986-02/08/2007 X 6   MAXIMUM ACCESS (MAS)POSTERIOR LUMBAR INTERBODY FUSION (PLIF) 3 LEVEL  02/08/2007   w/rods and screws   RIGHT HEART CATH N/A 02/21/2022   Procedure: RIGHT HEART CATH;  Surgeon: Larey Dresser, MD;  Location: [facility];  Service: Cardiovascular;  Laterality: N/A;   SPINAL CORD STIMULATOR INSERTION N/A 05/08/2014   Procedure: LUMBAR SPINAL CORD STIMULATOR IMPLANT ;  Surgeon: Bonna Gains, MD;  Location: [facility];  Service: Neurosurgery;  Laterality: N/A;   TOE FUSION Right 06/02/2014   "outside part of big toe"   TOTAL KNEE ARTHROPLASTY Left ~ 2011    Home Medications:  Allergies as of 08/04/2022       Reactions   Dulcolax [bisacodyl] Other (See Comments)   Severe stomach cramps   Codeine Itching   Low amount is tolerated   Proscar [finasteride] Hives, Rash        Medication List        Accurate as of August 04, 2022 10:24 AM. If you have any questions, ask your nurse or doctor.          acetaminophen 500 MG tablet Commonly  known as: TYLENOL Take 500 mg by mouth every 6 (six) hours as needed (headache.).   aspirin EC 81 MG tablet Take 81 mg by mouth in the morning.   bisoprolol 5 MG tablet Commonly known as: ZEBETA Take 0.5 tablets (2.5 mg total) by mouth daily.   Cholecalciferol 25 MCG (1000 UT) tablet Take 1,000 Units by mouth in the morning.   DULoxetine 20 MG capsule Commonly known as: CYMBALTA TAKE 1 CAPSULE(20 MG) BY MOUTH DAILY   Entresto 49-51 MG Generic drug: sacubitril-valsartan Take 1 tablet by mouth 2 (two) times daily.   furosemide 40 MG tablet Commonly known as: LASIX Take 40 mg by mouth daily.   gabapentin 300 MG capsule Commonly known as: NEURONTIN Take 1 capsule (300 mg total) by mouth at bedtime.   Jardiance 10 MG  Tabs tablet Generic drug: empagliflozin TAKE 1 TABLET(10 MG) BY MOUTH EVERY MORNING   lubiprostone 8 MCG capsule Commonly known as: AMITIZA Take 8 mcg by mouth 2 (two) times daily with a meal.   methocarbamol 750 MG tablet Commonly known as: ROBAXIN Take 750 mg by mouth at bedtime.   nitroGLYCERIN 0.4 MG SL tablet Commonly known as: Nitrostat Place 1 tablet (0.4 mg total) under the tongue every 5 (five) minutes as needed for chest pain. If you require more than two tablets five minutes apart go to the nearest ER via EMS.   pyridoxine 100 MG tablet Commonly known as: B-6 Take 100 mg by mouth in the morning.   rOPINIRole 2 MG tablet Commonly known as: REQUIP Take 2 mg by mouth in the morning, at noon, and at bedtime.   rosuvastatin 40 MG tablet Commonly known as: CRESTOR Take 20 mg by mouth at bedtime.   spironolactone 25 MG tablet Commonly known as: ALDACTONE Take 1 tablet (25 mg total) by mouth daily.   tadalafil 20 MG tablet Commonly known as: CIALIS Take 20 mg by mouth daily as needed for erectile dysfunction.   tadalafil 5 MG tablet Commonly known as: CIALIS Take 1 tablet (5 mg total) by mouth daily.   tamsulosin 0.4 MG Caps capsule Commonly known as: FLOMAX TAKE 1 CAPSULE(0.4 MG) BY MOUTH IN THE MORNING AND AT BEDTIME   traZODone 100 MG tablet Commonly known as: DESYREL Take 100 mg by mouth at bedtime.        Allergies:  Allergies  Allergen Reactions   Dulcolax [Bisacodyl] Other (See Comments)    Severe stomach cramps   Codeine Itching    Low amount is tolerated   Proscar [Finasteride] Hives and Rash    Family History: Family History  Problem Relation Age of Onset   Aneurysm Mother    Heart attack Father    Hypertension Father    CAD Father    Cancer Other     Social History:  reports that he has never smoked. He has never used smokeless tobacco. He reports that he does not currently use alcohol after a past usage of about 3.0 standard  drinks of alcohol per week. He reports that he does not use drugs.  ROS: All other review of systems were reviewed and are negative except what is noted above in HPI  Physical Exam: BP (!) 109/55   Pulse 73   Constitutional:  Alert and oriented, No acute distress. HEENT: [place] AT, moist mucus membranes.  Trachea midline, no masses. Cardiovascular: No clubbing, cyanosis, or edema. Respiratory: Normal respiratory effort, no increased work of breathing. GI: Abdomen is soft, nontender, nondistended, no  abdominal masses GU: No CVA tenderness.  Lymph: No cervical or inguinal lymphadenopathy. Skin: No rashes, bruises or suspicious lesions. Neurologic: Grossly intact, no focal deficits, moving all 4 extremities. Psychiatric: Normal mood and affect.  Laboratory Data: Lab Results  Component Value Date   WBC 6.4 02/12/2022   HGB 12.2 (L) 02/21/2022   HCT 36.0 (L) 02/21/2022   MCV 94.4 02/12/2022   PLT 212 02/12/2022    Lab Results  Component Value Date   CREATININE 1.09 06/29/2022    No results found for: "PSA"  Lab Results  Component Value Date   TESTOSTERONE 611 06/15/2020    Lab Results  Component Value Date   HGBA1C  04/20/2010    5.6 (NOTE)                                                                       According to the ADA Clinical Practice Recommendations for 2011, when HbA1c is used as a screening test:   >=6.5%   Diagnostic of Diabetes Mellitus           (if abnormal result  is confirmed)  5.7-6.4%   Increased risk of developing Diabetes Mellitus  References:Diagnosis and Classification of Diabetes Mellitus,Diabetes ZOXW,9604,54(UJWJX 1):S62-S69 and Standards of Medical Care in         Diabetes - 2011,Diabetes Care,2011,34  (Suppl 1):S11-S61.    Urinalysis    Component Value Date/Time   COLORURINE YELLOW 06/06/2020 1403   APPEARANCEUR Clear 01/31/2022 1038   LABSPEC 1.009 06/06/2020 1403   PHURINE 7.0 06/06/2020 1403   GLUCOSEU Negative 01/31/2022 1038    HGBUR MODERATE (A) 06/06/2020 1403   BILIRUBINUR Negative 01/31/2022 1038   KETONESUR NEGATIVE 06/06/2020 1403   PROTEINUR Negative 01/31/2022 1038   PROTEINUR 30 (A) 06/06/2020 1403   UROBILINOGEN 0.2 04/21/2010 1203   NITRITE Negative 01/31/2022 1038   NITRITE NEGATIVE 06/06/2020 1403   LEUKOCYTESUR Negative 01/31/2022 1038   LEUKOCYTESUR SMALL (A) 06/06/2020 1403    Lab Results  Component Value Date   LABMICR Comment 01/31/2022   WBCUA 11-30 (A) 01/28/2021   LABEPIT 0-10 01/28/2021   MUCUS Present 01/28/2021   BACTERIA Many (A) 01/28/2021    Pertinent Imaging:  No results found for this or any previous visit.  No results found for this or any previous visit.  No results found for this or any previous visit.  No results found for this or any previous visit.  No results found for this or any previous visit.  No valid procedures specified. No results found for this or any previous visit.  No results found for this or any previous visit.   Assessment & Plan:    1. Urinary retention -resolved after urolift  - Urinalysis, Routine w reflex microscopic - BLADDER SCAN AMB NON-IMAGING  2. Benign prostatic hyperplasia with urinary obstruction -improved after urolift  3. Erectile dysfunction due to arterial insufficiency -we will trial tadalafil 5mg  daily and 10-20mg  prn   No follow-ups on file.  Nicolette Bang, MD  [facility] [place]

## 2022-08-04 NOTE — Progress Notes (Signed)
post void residual=76 

## 2022-08-04 NOTE — Patient Instructions (Signed)
Erectile Dysfunction Erectile dysfunction (ED) is the inability to get or keep an erection in order to have sexual intercourse. ED is considered a symptom of an underlying disorder and is not considered a disease. ED may include: Inability to get an erection. Lack of enough hardness of the erection to allow penetration. Loss of erection before sex is finished. What are the causes? This condition may be caused by: Physical causes, such as: Artery problems. This may include heart disease, high blood pressure, atherosclerosis, and diabetes. Hormonal problems, such as low testosterone. Obesity. Nerve problems. This may include back or pelvic injuries, multiple sclerosis, Parkinson's disease, spinal cord injury, and stroke. Certain medicines, such as: Pain relievers. Antidepressants. Blood pressure medicines and water pills (diuretics). Cancer medicines. Antihistamines. Muscle relaxants. Lifestyle factors, such as: Use of drugs such as marijuana, cocaine, or opioids. Excessive use of alcohol. Smoking. Lack of physical activity or exercise. Psychological causes, such as: Anxiety or stress. Sadness or depression. Exhaustion. Fear about sexual performance. Guilt. What are the signs or symptoms? Symptoms of this condition include: Inability to get an erection. Lack of enough hardness of the erection to allow penetration. Loss of the erection before sex is finished. Sometimes having normal erections, but with frequent unsatisfactory episodes. Low sexual satisfaction in either partner due to erection problems. A curved penis occurring with erection. The curve may cause pain, or the penis may be too curved to allow for intercourse. Never having nighttime or morning erections. How is this diagnosed? This condition is often diagnosed by: Performing a physical exam to find other diseases or specific problems with the penis. Asking you detailed questions about the problem. Doing tests,  such as: Blood tests to check for diabetes mellitus or high cholesterol, or to measure hormone levels. Other tests to check for underlying health conditions. An ultrasound exam to check for scarring. A test to check blood flow to the penis. Doing a sleep study at home to measure nighttime erections. How is this treated? This condition may be treated by: Medicines, such as: Medicine taken by mouth to help you achieve an erection (oral medicine). Hormone replacement therapy to replace low testosterone levels. Medicine that is injected into the penis. Your health care provider may instruct you how to give yourself these injections at home. Medicine that is delivered with a short applicator tube. The tube is inserted into the opening at the tip of the penis, which is the opening of the urethra. A tiny pellet of medicine is put in the urethra. The pellet dissolves and enhances erectile function. This is also called MUSE (medicated urethral system for erections) therapy. Vacuum pump. This is a pump with a ring on it. The pump and ring are placed on the penis and used to create pressure that helps the penis become erect. Penile implant surgery. In this procedure, you may receive: An inflatable implant. This consists of cylinders, a pump, and a reservoir. The cylinders can be inflated with a fluid that helps to create an erection, and they can be deflated after intercourse. A semi-rigid implant. This consists of two silicone rubber rods. The rods provide some rigidity. They are also flexible, so the penis can both curve downward in its normal position and become straight for sexual intercourse. Blood vessel surgery to improve blood flow to the penis. During this procedure, a blood vessel from a different part of the body is placed into the penis to allow blood to flow around (bypass) damaged or blocked blood vessels. Lifestyle changes,  such as exercising more, losing weight, and quitting smoking. ?Follow  these instructions at home: ?Medicines ? ?Take over-the-counter and prescription medicines only as told by your health care provider. Do not increase the dosage without first discussing it with your health care provider. ?If you are using self-injections, do injections as directed by your health care provider. Make sure you avoid any veins that are on the surface of the penis. After giving an injection, apply pressure to the injection site for 5 minutes. ?Talk to your health care provider about how to prevent headaches while taking ED medicines. These medicines may cause a sudden headache due to the increase in blood flow in your body. ?General instructions ?Exercise regularly, as directed by your health care provider. Work with your health care provider to lose weight, if needed. ?Do not use any products that contain nicotine or tobacco. These products include cigarettes, chewing tobacco, and vaping devices, such as e-cigarettes. If you need help quitting, ask your health care provider. ?Before using a vacuum pump, read the instructions that come with the pump and discuss any questions with your health care provider. ?Keep all follow-up visits. This is important. ?Contact a health care provider if: ?You feel nauseous. ?You are vomiting. ?You get sudden headaches while taking ED medicines. ?You have any concerns about your sexual health. ?Get help right away if: ?You are taking oral or injectable medicines and you have an erection that lasts longer than 4 hours. If your health care provider is unavailable, go to the nearest emergency room for evaluation. An erection that lasts much longer than 4 hours can result in permanent damage to your penis. ?You have severe pain in your groin or abdomen. ?You develop redness or severe swelling of your penis. ?You have redness spreading at your groin or lower abdomen. ?You are unable to urinate. ?You experience chest pain or a rapid heartbeat (palpitations) after taking oral  medicines. ?These symptoms may represent a serious problem that is an emergency. Do not wait to see if the symptoms will go away. Get medical help right away. Call your local emergency services (911 in the [place] Do not drive yourself to the hospital. ?Summary ?Erectile dysfunction (ED) is the inability to get or keep an erection during sexual intercourse. ?This condition is diagnosed based on a physical exam, your symptoms, and tests to determine the cause. Treatment varies depending on the cause and may include medicines, hormone therapy, surgery, or a vacuum pump. ?You may need follow-up visits to make sure that you are using your medicines or devices correctly. ?Get help right away if you are taking or injecting medicines and you have an erection that lasts longer than 4 hours. ?This information is not intended to replace advice given to you by your health care provider. Make sure you discuss any questions you have with your health care provider. ?Document Revised: 09/15/2020 Document Reviewed: 09/15/2020 ?Elsevier Patient Education ? 2023 [employer] ? ?

## 2022-08-09 ENCOUNTER — Ambulatory Visit: Payer: Medicare PPO | Admitting: Orthopaedic Surgery

## 2022-08-17 ENCOUNTER — Telehealth: Payer: Self-pay | Admitting: Orthopaedic Surgery

## 2022-08-17 NOTE — Telephone Encounter (Signed)
Patient called asked if he can be worked into Dr. Trevor Mace of [facility]'s schedule? Patient asked if he can get a cortisone injection in his right knee? The number to contact patient is [phone]

## 2022-08-23 ENCOUNTER — Ambulatory Visit: Payer: Medicare PPO | Admitting: Orthopaedic Surgery

## 2022-08-23 DIAGNOSIS — M1711 Unilateral primary osteoarthritis, right knee: Secondary | ICD-10-CM

## 2022-08-23 DIAGNOSIS — M722 Plantar fascial fibromatosis: Secondary | ICD-10-CM | POA: Diagnosis not present

## 2022-08-23 NOTE — Progress Notes (Signed)
Office Visit Note   Patient: Brian Gallegos           Date of Birth: Feb 05, 1946           MRN: [id number] Visit Date: 08/23/2022              Requested by: Kathyrn Lass, [place],  [place] [id number] PCP: Kathyrn Lass, MD   Assessment & Plan: Visit Diagnoses:  1. Primary osteoarthritis of right knee   2. Plantar fasciitis of right foot     Plan: In regards to the plantar fasciitis recommend he apply Voltaren gel 4 g 4 times daily to the heel.  Work on gastrocsoleus stretching exercises.  Proper shoewear discussed.  As to his right knee was wrapped with an Ace bandage after the aspiration.  He will need surgical clearance from his cardiologist.  However we will go ahead and begin scheduling for right total knee replacement near future.  Risk benefits of surgery discussed with him at length.  Risk include but are not limited to DVT/PE, wound healing problems, prolonged pain worsening pain and blood loss.  Follow-Up Instructions: Return for post op.   Orders:  No orders of the defined types were placed in this encounter.  No orders of the defined types were placed in this encounter.     Procedures: No procedures performed   Clinical Data: No additional findings.   Subjective: Chief Complaint  Patient presents with   Right Knee - Pain    HPI HPI: Brian Gallegos comes in today for right knee pain.  He has known end-stage arthritis of his right knee.  Last had cortisone injection in his knee 08/30/2021.  He states this not help.  He is also tried viscosupplementation without any relief.  He has tried knee braces.  He states that the knee pain is interfering with his quality of life.  He has difficulty playing golf due to the pain.  He notes he has been dealing with the pain for years.  He is also having right heel pain without any injury.  He is asking for [place] to see what can be done in regards to his heel pain today.  He denies any fevers or chills.  He denies any  diabetes but is on Jardiance.  Prior radiographs of his right knee dated 08/24/2021 showed tricompartmental arthritic changes with near bone-on-bone medial compartment.  Severe patellofemoral arthritic changes.  Mild to moderate changes lateral compartment. Does see his cardiologist regularly states that he discussed with him the possibility of undergoing a right knee replacement in the near future and states that he was told this would be fine. Review of Systems Denies any chest pain, shortness of breath, orthopnea, fevers, chills.  Objective: Vital Signs: There were no vitals taken for this visit.  Physical Exam Constitutional:      Appearance: He is normal weight. He is not ill-appearing or diaphoretic.  Pulmonary:     Effort: Pulmonary effort is normal.  Neurological:     Mental Status: He is alert and oriented to person, place, and time.  Psychiatric:        Behavior: Behavior normal.     Ortho Exam Right heel he has tenderness over the medial tubercle of the calcaneus and the posterior heel.  There is no rashes skin reaction ulcerations or ecchymosis.  Tight gastroc on the right.  Achilles is intact and nontender the main portion of the Achilles however over the very distal insertion  there is slight tenderness. Left knee good range of motion without pain.  Well-healed surgical scar from previous knee replacement. Right knee: No abnormal warmth erythema.  Positive effusion.  Was able to aspirate the knee today got 25 cc of synovial aspirate.  Patient tolerated well.  Right knee tenderness along medial joint line.  He lacks a few degrees in full extension and flexes to approximately 115 degrees. Specialty Comments:  No specialty comments available.  Imaging: No results found.   PMFS History: Patient Active Problem List   Diagnosis Date Noted   Chronic combined systolic and diastolic heart failure ([place]) 02/08/2022   Coronary artery disease    Unilateral primary osteoarthritis,  right knee 08/30/2021   Erectile dysfunction due to arterial insufficiency 07/30/2020   Benign prostatic hyperplasia with urinary obstruction 06/14/2020   Urinary retention 06/14/2020   Post PTCA 10/12/2015   Abnormal cardiovascular stress test 10/11/2015   Atypical chest pain 10/11/2015   Past Medical History:  Diagnosis Date   Arthritis    "all over"   CHF (congestive heart failure) ([place])    Chronic lower back pain    Coronary artery disease    Early cataracts, bilateral    Family history of adverse reaction to anesthesia    "son had ligament repair of elbow in 2016; woke up nauseated and disoriented"   Hypercholesterolemia    Hypertension    Migraine    "none in years" (10/12/2015)   Restless leg syndrome    Wears glasses     Family History  Problem Relation Age of Onset   Aneurysm Mother    Heart attack Father    Hypertension Father    CAD Father    Cancer Other     Past Surgical History:  Procedure Laterality Date   ANTERIOR CERVICAL DECOMP/DISCECTOMY FUSION  ~ 2005   ANTERIOR CERVICAL DISCECTOMY     BACK SURGERY     CARDIAC CATHETERIZATION N/A 10/12/2015   Procedure: Left Heart Cath and Coronary Angiography;  Surgeon: Adrian Prows, MD;  Location: [facility];  Service: Cardiovascular;  Laterality: N/A;   CARDIAC CATHETERIZATION  10/12/2015   Procedure: Coronary Stent Intervention;  Surgeon: Adrian Prows, MD;  Location: [facility];  Service: Cardiovascular;;   COLONOSCOPY     CORONARY ANGIOPLASTY  10/12/2015   DES RCA   CYSTOSCOPY WITH INSERTION OF UROLIFT N/A 05/02/2021   Procedure: CYSTOSCOPY WITH INSERTION OF UROLIFT;  Surgeon: Cleon Gustin, MD;  Location: [facility];  Service: Urology;  Laterality: N/A;   EYE SURGERY Left    cataract   FRACTURE SURGERY     nose repaired from accident   GLAUCOMA SURGERY Left 03/04/2015   JOINT REPLACEMENT     KNEE ARTHROSCOPY Bilateral    LEFT HEART CATH AND CORONARY ANGIOGRAPHY N/A 01/17/2022   Procedure: LEFT  HEART CATH AND CORONARY ANGIOGRAPHY;  Surgeon: Adrian Prows, MD;  Location: [facility];  Service: Cardiovascular;  Laterality: N/A;   LUMBAR DISC SURGERY  ~ 1986-02/08/2007 X 6   MAXIMUM ACCESS (MAS)POSTERIOR LUMBAR INTERBODY FUSION (PLIF) 3 LEVEL  02/08/2007   w/rods and screws   RIGHT HEART CATH N/A 02/21/2022   Procedure: RIGHT HEART CATH;  Surgeon: Larey Dresser, MD;  Location: [facility];  Service: Cardiovascular;  Laterality: N/A;   SPINAL CORD STIMULATOR INSERTION N/A 05/08/2014   Procedure: LUMBAR SPINAL CORD STIMULATOR IMPLANT ;  Surgeon: Bonna Gains, MD;  Location: [facility];  Service: Neurosurgery;  Laterality: N/A;   TOE FUSION Right 06/02/2014   "outside part of big toe"   TOTAL KNEE ARTHROPLASTY Left ~ 2011   Social History   Occupational History   Not on file  Tobacco Use   Smoking status: Never   Smokeless tobacco: Never  Vaping Use   Vaping Use: Never used  Substance and Sexual Activity   Alcohol use: Not Currently    Alcohol/week: 3.0 standard drinks of alcohol    Types: 3 Glasses of wine per week    Comment: few times week   Drug use: No   Sexual activity: Not Currently

## 2022-08-29 ENCOUNTER — Other Ambulatory Visit: Payer: Self-pay | Admitting: Cardiology

## 2022-08-29 DIAGNOSIS — I5041 Acute combined systolic (congestive) and diastolic (congestive) heart failure: Secondary | ICD-10-CM

## 2022-09-10 ENCOUNTER — Encounter: Payer: Self-pay | Admitting: Orthopaedic Surgery

## 2022-09-12 ENCOUNTER — Telehealth (HOSPITAL_COMMUNITY): Payer: Self-pay

## 2022-09-12 ENCOUNTER — Encounter (HOSPITAL_COMMUNITY): Payer: Self-pay

## 2022-09-12 NOTE — Telephone Encounter (Signed)
Received Cardiac Clearance form from [facility] requesting patient be cleared for the following procedure Right total knee arthroplasty. Form was placed in Dr. Loralie Champagne folder on 12/Mar/2024 for signature. Will update chart once clearance has been reviewed and signed by provider.

## 2022-09-18 ENCOUNTER — Telehealth (HOSPITAL_COMMUNITY): Payer: Self-pay

## 2022-09-18 NOTE — Telephone Encounter (Signed)
Surgical clearance faxed to [facility] at [phone]

## 2022-09-20 DIAGNOSIS — Z0000 Encounter for general adult medical examination without abnormal findings: Secondary | ICD-10-CM | POA: Diagnosis not present

## 2022-09-20 DIAGNOSIS — Z9181 History of falling: Secondary | ICD-10-CM | POA: Diagnosis not present

## 2022-09-20 DIAGNOSIS — Z6829 Body mass index (BMI) 29.0-29.9, adult: Secondary | ICD-10-CM | POA: Diagnosis not present

## 2022-09-22 NOTE — Telephone Encounter (Signed)
I called patient and scheduled surgery. 

## 2022-09-26 DIAGNOSIS — Z23 Encounter for immunization: Secondary | ICD-10-CM | POA: Diagnosis not present

## 2022-09-26 DIAGNOSIS — E7800 Pure hypercholesterolemia, unspecified: Secondary | ICD-10-CM | POA: Diagnosis not present

## 2022-09-26 DIAGNOSIS — N529 Male erectile dysfunction, unspecified: Secondary | ICD-10-CM | POA: Diagnosis not present

## 2022-09-26 DIAGNOSIS — N400 Enlarged prostate without lower urinary tract symptoms: Secondary | ICD-10-CM | POA: Diagnosis not present

## 2022-09-26 DIAGNOSIS — I5042 Chronic combined systolic (congestive) and diastolic (congestive) heart failure: Secondary | ICD-10-CM | POA: Diagnosis not present

## 2022-09-26 DIAGNOSIS — M4726 Other spondylosis with radiculopathy, lumbar region: Secondary | ICD-10-CM | POA: Diagnosis not present

## 2022-09-26 DIAGNOSIS — R7303 Prediabetes: Secondary | ICD-10-CM | POA: Diagnosis not present

## 2022-09-26 DIAGNOSIS — I700 Atherosclerosis of aorta: Secondary | ICD-10-CM | POA: Diagnosis not present

## 2022-09-26 DIAGNOSIS — F411 Generalized anxiety disorder: Secondary | ICD-10-CM | POA: Diagnosis not present

## 2022-09-27 NOTE — Telephone Encounter (Signed)
Per kelley kidd,[facility] clearance was faxed on 09/18/22

## 2022-10-11 DIAGNOSIS — K529 Noninfective gastroenteritis and colitis, unspecified: Secondary | ICD-10-CM | POA: Diagnosis not present

## 2022-10-11 DIAGNOSIS — Z1211 Encounter for screening for malignant neoplasm of colon: Secondary | ICD-10-CM | POA: Diagnosis not present

## 2022-10-16 NOTE — Progress Notes (Signed)
PCP: Sigmund Hazel, MD HF Cardiology: Dr. Shirlee Latch  77 y.o. with history of HTN, hyperlipidemia, and CAD was referred by Dr. Odis Hollingshead for evaluation of CHF.  Patient had a cath in 4/17 with DES to 90% proximal RCA stenosis.  Echoes after that time showed mildly decreased systolic function in the 45-50% range.  He was generally asymptomatic, however, until around 6/23.  In 6/23, he developed progressive dyspnea and peripheral edema.  Echo in 6/23 showed EF 40-45%, mild LVH, grade 2 diastolic dysfunction, RV normal, severe LAE, mild-moderate MR.  He was started on Bumex which gave him headaches, this was switched to Lasix.  Peripheral edema improved but he has continued to feel short of breath with exertion.  No definite trigger for symptoms, no episode of prolonged chest pain or severe viral-type illness.  He had coronary angiography in 7/23, showing patent RCA stent and nonobstructive mild CAD. He drank heavily in the past but not in the last 5 years (3-4 glasses wine/week now). He has a family history of CAD (father at around 52) but not cardiomyopathy or sudden cardiac death.  He was noted to be volume overloaded and Lasix was increased.   RHC in 8/23 showed normal filling pressures on higher Lasix dose.  CPX in 8/23 showed low normal functional capacity, no more than mild functional limitation due to heart failure.  PFTs were within normal limits.   Today he returns for HF follow up. Overall feeling fine. He has SOB walking uphill, plays golf twice a week and physically limited by R knee OA.  Planning TKA next month w/ Dr. Magnus Ivan. Had a fall 3 weeks ago, got up during night with  nausea (felt like he was going to have diarrhea) and he went down on his hands and knees. Did not lose consciousness. No further issues since. Denies palpitations, abnormal bleeding, CP, edema, or PND/Orthopnea. Appetite ok. No fever or chills. Weight at home 190 pounds. Taking all medications. Has not been on Lasix for awhile. BP at  home 110-70s.  ECG (personally reviewed): NSR with PAC, 64 bpm  Labs (6/23): pro-BNP 2381 Labs (7/23): K 4.5, creatinine 1.03, pro-BNP 1455 Labs (9/23): BNP 450 Labs (10/23): K 4, creatinine 1.01 Labs (12/23): K 4.1, creatinine 1.09, LDL 62  PMH: 1. HTN 2. Hyperlipidemia 3. CAD: LHC in 4/17 with 90% pRCA treated with DES.  - LHC (7/23): Patent RCA stent, mild nonobstructive disease.  4. Chronic HF with mid range EF: ?Ischemic cardiomyopathy.  - Echo (3/18): EF 50%.  - Echo (7/21): EF 45-50%, septal HK, moderate MR - Echo (6/23): EF 40-45%, mild LVH, grade 2 diastolic dysfunction, RV normal, severe LAE, mild-moderate MR.  - RHC (8/23): mean RA 1, PA 33/13 mean 22, mean PCWP 11, CI 4.08 - CPX (8/23): peak VO2 18.9, VE/VCO2 slope 34, RER 1.22, normal PFTs => low normal functional status, no more than mild HF limitation.   SH: Married x 2, now widower.  Has 2 children.  Lives in [place].  Designer, television/film set.  Drinks 3-4 glasses wine/week. Nonsmoker though exposed to 2nd hand smoke.   FH: Father with MI in his 79s, mother with brain aneurysm in her 50s.   Current Outpatient Medications  Medication Sig Dispense Refill   acetaminophen (TYLENOL) 500 MG tablet Take 500 mg by mouth every 6 (six) hours as needed (headache.).     aspirin EC 81 MG tablet Take 81 mg by mouth in the morning.     bisoprolol (ZEBETA) 5 MG  tablet Take 0.5 tablets (2.5 mg total) by mouth daily. 45 tablet 3   Cholecalciferol 25 MCG (1000 UT) tablet Take 1,000 Units by mouth in the morning.     diclofenac Sodium (VOLTAREN) 1 % GEL Apply topically as needed.     DULoxetine (CYMBALTA) 20 MG capsule TAKE 1 CAPSULE(20 MG) BY MOUTH DAILY 30 capsule 3   empagliflozin (JARDIANCE) 10 MG TABS tablet 1 tablet Orally Once a day     gabapentin (NEURONTIN) 300 MG capsule Take 1 capsule (300 mg total) by mouth at bedtime. 60 capsule 1   JARDIANCE 10 MG TABS tablet TAKE 1 TABLET(10 MG) BY MOUTH EVERY MORNING 90 tablet 0    lubiprostone (AMITIZA) 8 MCG capsule Take 8 mcg by mouth daily.     methocarbamol (ROBAXIN) 750 MG tablet Take 750 mg by mouth at bedtime.     nitroGLYCERIN (NITROSTAT) 0.4 MG SL tablet Place 1 tablet (0.4 mg total) under the tongue every 5 (five) minutes as needed for chest pain. If you require more than two tablets five minutes apart go to the nearest ER via EMS. 30 tablet 0   pyridoxine (B-6) 100 MG tablet Take 100 mg by mouth in the morning.     rOPINIRole (REQUIP) 2 MG tablet Take 2 mg by mouth in the morning, at noon, and at bedtime.     rosuvastatin (CRESTOR) 40 MG tablet Take 20 mg by mouth at bedtime.     sacubitril-valsartan (ENTRESTO) 49-51 MG Take 1 tablet by mouth 2 (two) times daily. 180 tablet 3   spironolactone (ALDACTONE) 25 MG tablet Take 1 tablet (25 mg total) by mouth daily. 45 tablet 2   tadalafil (CIALIS) 20 MG tablet Take 1 tablet (20 mg total) by mouth daily as needed for erectile dysfunction. 10 tablet 3   tadalafil (CIALIS) 5 MG tablet Take 1 tablet (5 mg total) by mouth daily. (Patient taking differently: Take 5 mg by mouth at bedtime.) 30 tablet 11   tamsulosin (FLOMAX) 0.4 MG CAPS capsule TAKE 1 CAPSULE(0.4 MG) BY MOUTH IN THE MORNING AND AT BEDTIME 60 capsule 11   traZODone (DESYREL) 100 MG tablet Take 100 mg by mouth at bedtime.     vitamin C (ASCORBIC ACID) 250 MG tablet Take 250 mg by mouth daily.     No current facility-administered medications for this encounter.   Wt Readings from Last 3 Encounters:  10/17/22 90.8 kg (200 lb 3.2 oz)  06/20/22 88.2 kg (194 lb 6.4 oz)  04/11/22 85 kg (187 lb 6.4 oz)   BP (!) 140/82   Pulse 86   Wt 90.8 kg (200 lb 3.2 oz)   SpO2 96%   BMI 29.56 kg/m  Physical Exam General:  NAD. No resp difficulty, walked into clinic HEENT: Normal Neck: Supple. No JVD. Carotids 2+ bilat; no bruits. No lymphadenopathy or thryomegaly appreciated. Cor: PMI nondisplaced. Regular rate & rhythm. No rubs, gallops or murmurs. Lungs:  Clear Abdomen: Soft, nontender, nondistended. No hepatosplenomegaly. No bruits or masses. Good bowel sounds. Extremities: No cyanosis, clubbing, rash, edema Neuro: Alert & oriented x 3, cranial nerves grossly intact. Moves all 4 extremities w/o difficulty. Affect pleasant.  Assessment/Plan: 1. Chronic HF with mid range EF:  Most recent echo in 6/23 showed EF 40-45%, mild LVH, grade 2 diastolic dysfunction, RV normal, severe LAE, mild-moderate MR.  He has a history of CAD with PCI to proximal RCA in 2017, but I do not think that CAD/ischemic cardiomyopathy is a definite explanation for  his cardiomyopathy/CHF.  Coronary angiography in 7/23 showed patent RCA stent and nonobstructive mild CAD.  RHC in 8/23 after augmented diuresis showed normal filling pressures and cardiac output.  CPX in 8/23 showed only mild functional limitation from CHF. NYHA class II, not volume overloaded.   - Unable to obtain cardiac MRI due to spinal stimulator.  - Continue Entresto 49/51 mg bid. BMET today. - Continue bisoprolol 2.5 mg daily.  - Continue Jardiance 10 mg daily.  - Continue spironolactone 25 mg daily.  - Update echo next visit to ensure EF stable. 2. CAD: 2017 PCI to RCA.  Cath 7/23 with patent RCA stent and nonobstructive mild disease.  No chest pain.  - Continue ASA 81 daily.  - Continue Crestor 20 mg daily. Good lipids 12/23. 3. Fall: No further issues. He is on tamsulosin + tadalafil nightly, ? Orthostasis. Discussed changing positions slowly, especially at night.  Followup 3-4 months with Dr. Shirlee Latch, update echo.  Anderson Malta [facility] FNP-BC 10/17/2022

## 2022-10-17 ENCOUNTER — Encounter (HOSPITAL_COMMUNITY): Payer: Self-pay

## 2022-10-17 ENCOUNTER — Ambulatory Visit (HOSPITAL_COMMUNITY)
Admission: RE | Admit: 2022-10-17 | Discharge: 2022-10-17 | Disposition: A | Discharge status: Home / Self Care | Payer: Medicare PPO | Source: Ambulatory Visit | Attending: Family Medicine | Admitting: Family Medicine

## 2022-10-17 VITALS — BP 140/82 | HR 86 | Wt 200.2 lb

## 2022-10-17 DIAGNOSIS — Z7982 Long term (current) use of aspirin: Secondary | ICD-10-CM | POA: Diagnosis not present

## 2022-10-17 DIAGNOSIS — I429 Cardiomyopathy, unspecified: Secondary | ICD-10-CM | POA: Diagnosis not present

## 2022-10-17 DIAGNOSIS — E785 Hyperlipidemia, unspecified: Secondary | ICD-10-CM | POA: Diagnosis not present

## 2022-10-17 DIAGNOSIS — W19XXXS Unspecified fall, sequela: Secondary | ICD-10-CM

## 2022-10-17 DIAGNOSIS — R42 Dizziness and giddiness: Secondary | ICD-10-CM | POA: Diagnosis not present

## 2022-10-17 DIAGNOSIS — Z7984 Long term (current) use of oral hypoglycemic drugs: Secondary | ICD-10-CM | POA: Diagnosis not present

## 2022-10-17 DIAGNOSIS — Z955 Presence of coronary angioplasty implant and graft: Secondary | ICD-10-CM | POA: Insufficient documentation

## 2022-10-17 DIAGNOSIS — I110 Hypertensive heart disease with heart failure: Secondary | ICD-10-CM | POA: Diagnosis not present

## 2022-10-17 DIAGNOSIS — I5022 Chronic systolic (congestive) heart failure: Secondary | ICD-10-CM | POA: Diagnosis not present

## 2022-10-17 DIAGNOSIS — I255 Ischemic cardiomyopathy: Secondary | ICD-10-CM | POA: Diagnosis not present

## 2022-10-17 DIAGNOSIS — Z79899 Other long term (current) drug therapy: Secondary | ICD-10-CM | POA: Insufficient documentation

## 2022-10-17 DIAGNOSIS — I340 Nonrheumatic mitral (valve) insufficiency: Secondary | ICD-10-CM | POA: Insufficient documentation

## 2022-10-17 DIAGNOSIS — I2510 Atherosclerotic heart disease of native coronary artery without angina pectoris: Secondary | ICD-10-CM | POA: Insufficient documentation

## 2022-10-17 LAB — BASIC METABOLIC PANEL
Anion gap: 8 (ref 5–15)
BUN: 22 mg/dL (ref 8–23)
CO2: 30 mmol/L (ref 22–32)
Calcium: 9.1 mg/dL (ref 8.9–10.3)
Chloride: 101 mmol/L (ref 98–111)
Creatinine, Ser: 1.16 mg/dL (ref 0.61–1.24)
GFR, Estimated: >60 mL/min (ref >60)
Glucose, Bld: 109 mg/dL — ABNORMAL HIGH (ref 70–99)
Potassium: 4.3 mmol/L (ref 3.5–5.1)
Sodium: 139 mmol/L (ref 135–145)

## 2022-10-17 NOTE — Patient Instructions (Signed)
It was great to see you today! No medication changes are needed at this time.  Labs today We will only contact you if something comes back abnormal or we need to make some changes. Otherwise no news is good news!  Your physician wants you to follow-up in: 4 months with Dr Shirlee Latch and echo. You will receive a reminder letter in the mail two months in advance. If you don't receive a letter, please call our office to schedule the follow-up appointment.   Your physician has requested that you have an echocardiogram. Echocardiography is a painless test that uses sound waves to create images of your heart. It provides your doctor with information about the size and shape of your heart and how well your heart's chambers and valves are working. This procedure takes approximately one hour. There are no restrictions for this procedure. Please do NOT wear cologne, perfume, aftershave, or lotions (deodorant is allowed). Please arrive 15 minutes prior to your appointment time.  Do the following things EVERYDAY: Weigh yourself in the morning before breakfast. Write it down and keep it in a log. Take your medicines as prescribed Eat low salt foods--Limit salt (sodium) to 2000 mg per day.  Stay as active as you can everyday Limit all fluids for the day to less than 2 liters  At [facility], you and your health needs are our priority. As part of our continuing mission to provide you with exceptional heart care, we have created designated Provider Care Teams. These Care Teams include your primary Cardiologist (physician) and Advanced Practice Providers (APPs- Physician Assistants and Nurse Practitioners) who all work together to provide you with the care you need, when you need it.   You may see any of the following providers on your designated Care Team at your next follow up: Dr Arvilla Meres Dr Marca Ancona Dr. Marcos Eke, NP Robbie Lis, [place] [facility] [place], [place] Brynda Peon, NP Karle Plumber, PharmD   Please be sure to bring in all your medications bottles to every appointment.    Thank you for choosing [facility]  If you have any questions or concerns before your next appointment please send [place] a message through [place] or call our office at [phone].    TO LEAVE A MESSAGE FOR THE NURSE SELECT OPTION 2, PLEASE LEAVE A MESSAGE INCLUDING: YOUR NAME DATE OF BIRTH CALL BACK NUMBER REASON FOR CALL**this is important as we prioritize the call backs  YOU WILL RECEIVE A CALL BACK THE SAME DAY AS LONG AS YOU CALL BEFORE 4:00 PM

## 2022-10-18 ENCOUNTER — Encounter: Payer: Self-pay | Admitting: Orthopaedic Surgery

## 2022-10-23 ENCOUNTER — Other Ambulatory Visit: Payer: Self-pay

## 2022-10-25 DIAGNOSIS — L570 Actinic keratosis: Secondary | ICD-10-CM | POA: Diagnosis not present

## 2022-10-25 DIAGNOSIS — X32XXXD Exposure to sunlight, subsequent encounter: Secondary | ICD-10-CM | POA: Diagnosis not present

## 2022-10-25 DIAGNOSIS — Z08 Encounter for follow-up examination after completed treatment for malignant neoplasm: Secondary | ICD-10-CM | POA: Diagnosis not present

## 2022-10-25 DIAGNOSIS — Z1283 Encounter for screening for malignant neoplasm of skin: Secondary | ICD-10-CM | POA: Diagnosis not present

## 2022-10-25 DIAGNOSIS — D225 Melanocytic nevi of trunk: Secondary | ICD-10-CM | POA: Diagnosis not present

## 2022-10-25 DIAGNOSIS — Z85820 Personal history of malignant melanoma of skin: Secondary | ICD-10-CM | POA: Diagnosis not present

## 2022-10-31 ENCOUNTER — Other Ambulatory Visit: Payer: Self-pay | Admitting: Orthopaedic Surgery

## 2022-11-07 ENCOUNTER — Other Ambulatory Visit: Payer: Self-pay | Admitting: Cardiology

## 2022-11-07 ENCOUNTER — Telehealth: Payer: Self-pay | Admitting: *Deleted

## 2022-11-07 ENCOUNTER — Other Ambulatory Visit (HOSPITAL_COMMUNITY): Payer: Self-pay

## 2022-11-07 DIAGNOSIS — I5041 Acute combined systolic (congestive) and diastolic (congestive) heart failure: Secondary | ICD-10-CM

## 2022-11-07 MED ORDER — EMPAGLIFLOZIN 10 MG PO TABS
ORAL_TABLET | ORAL | 1 refills | Status: DC
Start: 2022-11-07 — End: 2023-06-19

## 2022-11-07 NOTE — Telephone Encounter (Signed)
Patient having a Right TKR on 11/21/22 with you. He scheduled an appointment with you for tomorrow to discuss all post op information including rehab vs. Home, CPM or not, ice machine, etc. I have all his questions answered, but he does have a hematoma he states on his left lower leg around the shin and a couple of inches up from the ankle where his son's dog ran into him. He's had it about 3 weeks and it's larger than a golf ball, but seems to be very slowly improving. He asked about draining it at tomorrow's appointment and I wasn't sure about this. If we don't do that, he said he could cancel the appointment b/c he felt more comfortable with all the post op information I gave him.What do you think?

## 2022-11-07 NOTE — Care Plan (Unsigned)
[facility] RNCM call to patient to discuss his upcoming Right total knee arthroplasty with Dr. Magnus Ivan on 11/21/22. Patient is an Ortho bundle patient through [facility] and is agreeable to case management. He lives in an apartment below his son and daughter in [place]. He has a daughter as well that can assist after discharge. We discussed his possible discharge plan and at first he wanted to go to rehab at [facility]. We discussed this was not typical for a single elective joint replacement and the rehab that he would be eligible for would be STR in SNF. He declined this and states his children and church members will assist at his home after discharge. Anticipate HHPT will be needed after stay in hospital. Referral made to [facility] after choice provided. Reviewed all post op care instructions and will continue to follow.

## 2022-11-07 NOTE — Telephone Encounter (Signed)
Spoke with patient and he would like to stay on the schedule. Will meet as well for pre-op information.

## 2022-11-08 ENCOUNTER — Ambulatory Visit: Payer: Medicare PPO | Admitting: Orthopaedic Surgery

## 2022-11-08 DIAGNOSIS — M1711 Unilateral primary osteoarthritis, right knee: Secondary | ICD-10-CM

## 2022-11-08 NOTE — Progress Notes (Signed)
The patient is scheduled for right knee replacement in 2 weeks.  He is 77 years old.  He has received all the clearance that he needs for surgery and is doing well overall.  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  He has had extensive lumbar spine surgery in the past and does have a spinal cord stimulator as well.  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  He did need to see me today to look at a hematoma that he has on his left distal shin from where his dog collided into his leg.  He is not on blood thinning medications either other than aspirin.  He does have findings consistent with a hematoma of his distal-lateral tibia.  I did try to aspirate fluid from this and got only dark red blood consistent with a hematoma.  This decompressed manually but it gave him reassurance that this is not something worrisome or we will keep him from having any type of surgery for his right knee.  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  We have answered all of his questions as it relates to his upcoming knee replacement surgery.  Will see him the day of surgery for a right total knee replacement.

## 2022-11-14 NOTE — Pre-Procedure Instructions (Signed)
Surgical Instructions    Your procedure is scheduled on Nov 21, 2022.  Report to [facility] Main Entrance "A" at 11:45 A.M., then check in with the Admitting office.  Call this number if you have problems the morning of surgery:  [phone]  If you have any questions prior to your surgery date call [phone]: Open Monday-Friday 8am-4pm If you experience any cold or flu symptoms such as cough, fever, chills, shortness of breath, etc. between now and your scheduled surgery, please notify us at the above number.     Remember:  Do not eat after midnight the night before your surgery  You may drink clear liquids until 10:45 AM the morning of your surgery.   Clear liquids allowed are: Water, Non-Citrus Juices (without pulp), Carbonated Beverages, Clear Tea, Black Coffee Only (NO MILK, CREAM OR POWDERED CREAMER of any kind), and Gatorade.  Patient Instructions  The night before surgery:  No food after midnight. ONLY clear liquids after midnight  The day of surgery (if you do NOT have diabetes):  Drink ONE (1) Pre-Surgery Clear Ensure by 10:45 AM the morning of surgery. Drink in one sitting. Do not sip.  This drink was given to you during your hospital  pre-op appointment visit.  Nothing else to drink after completing the  Pre-Surgery Clear Ensure.         If you have questions, please contact your surgeon's office.     Take these medicines the morning of surgery with A SIP OF WATER:  bisoprolol (ZEBETA)   lubiprostone (AMITIZA)   tamsulosin (FLOMAX)   oxymetazoline (AFRIN) nasal spray - may take if needed   STOP taking empagliflozin (JARDIANCE) three days prior to surgery. Your last dose of this medication will be May 17th.   Follow your surgeon's instructions on when to stop Aspirin.  If no instructions were given by your surgeon then you will need to call the office to get those instructions.     As of today, STOP taking any Aleve, Naproxen, Ibuprofen, Motrin, Advil,  Goody's, BC's, all herbal medications, fish oil, and all vitamins. This includes your medication: diclofenac Sodium (VOLTAREN) GEL.  Continue to take your POTASSIUM SUPPLEMENT until the day before surgery. DO NOT take any the morning of surgery.                     Do NOT Smoke (Tobacco/Vaping) for 24 hours prior to your procedure.  If you use a CPAP at night, you may bring your mask/headgear for your overnight stay.   Contacts, glasses, piercing's, hearing aid's, dentures or partials may not be worn into surgery, please bring cases for these belongings.    For patients admitted to the hospital, discharge time will be determined by your treatment team.   Patients discharged the day of surgery will not be allowed to drive home, and someone needs to stay with them for 24 hours.  SURGICAL WAITING ROOM VISITATION Patients having surgery or a procedure may have no more than 2 support people in the waiting area - these visitors may rotate.   Children under the age of 59 must have an adult with them who is not the patient. If the patient needs to stay at the hospital during part of their recovery, the visitor guidelines for inpatient rooms apply. Pre-op nurse will coordinate an appropriate time for 1 support person to accompany patient in pre-op.  This support person may not rotate.   Please refer to the [facility] website  for the visitor guidelines for Inpatients (after your surgery is over and you are in a regular room).     Pre-operative 5 CHG Bath Instructions   You can play a key role in reducing the risk of infection after surgery. Your skin needs to be as free of germs as possible. You can reduce the number of germs on your skin by washing with CHG (chlorhexidine gluconate) soap before surgery. CHG is an antiseptic soap that kills germs and continues to kill germs even after washing.   DO NOT use if you have an allergy to chlorhexidine/CHG or antibacterial soaps. If your skin becomes  reddened or irritated, stop using the CHG and notify one of our RNs at [phone].   Please shower with the CHG soap starting 4 days before surgery using the following schedule:     Please keep in mind the following:  DO NOT shave, including legs and underarms, starting the day of your first shower.   You may shave your face at any point before/day of surgery.  Place clean sheets on your bed the day you start using CHG soap. Use a clean washcloth (not used since being washed) for each shower. DO NOT sleep with pets once you start using the CHG.   CHG Shower Instructions:  If you choose to wash your hair and private area, wash first with your normal shampoo/soap.  After you use shampoo/soap, rinse your hair and body thoroughly to remove shampoo/soap residue.  Turn the water OFF and apply about 3 tablespoons (45 ml) of CHG soap to a CLEAN washcloth.  Apply CHG soap ONLY FROM YOUR NECK DOWN TO YOUR TOES (washing for 3-5 minutes)  DO NOT use CHG soap on face, private areas, open wounds, or sores.  Pay special attention to the area where your surgery is being performed.  If you are having back surgery, having someone wash your back for you may be helpful. Wait 2 minutes after CHG soap is applied, then you may rinse off the CHG soap.  Pat dry with a clean towel  Put on clean clothes/pajamas   If you choose to wear lotion, please use ONLY the CHG-compatible lotions on the back of this paper.     Additional instructions for the day of surgery: DO NOT APPLY any lotions, deodorants, cologne, or perfumes.   Do not wear jewelry or makeup Do not bring valuables to the hospital.  Do not wear nail polish, gel polish, artificial nails, or any other type of covering on natural nails (fingers and toes) Put on clean/comfortable clothes.  Brush your teeth.  Ask your nurse before applying any prescription medications to the skin.      CHG Compatible Lotions   Aveeno Moisturizing lotion   Cetaphil Moisturizing Cream  Cetaphil Moisturizing Lotion  Clairol Herbal Essence Moisturizing Lotion, Dry Skin  Clairol Herbal Essence Moisturizing Lotion, Extra Dry Skin  Clairol Herbal Essence Moisturizing Lotion, Normal Skin  Curel Age Defying Therapeutic Moisturizing Lotion with Alpha Hydroxy  Curel Extreme Care Body Lotion  Curel Soothing Hands Moisturizing Hand Lotion  Curel Therapeutic Moisturizing Cream, Fragrance-Free  Curel Therapeutic Moisturizing Lotion, Fragrance-Free  Curel Therapeutic Moisturizing Lotion, Original Formula  Eucerin Daily Replenishing Lotion  Eucerin Dry Skin Therapy Plus Alpha Hydroxy Crme  Eucerin Dry Skin Therapy Plus Alpha Hydroxy Lotion  Eucerin Original Crme  Eucerin Original Lotion  Eucerin Plus Crme Eucerin Plus Lotion  Eucerin TriLipid Replenishing Lotion  Keri Anti-Bacterial Hand Lotion  Keri Deep Conditioning Original Lotion  Dry Skin Formula Softly Scented  Keri Deep Conditioning Original Lotion, Fragrance Free Sensitive Skin Formula  Keri Lotion Fast Absorbing Fragrance Free Sensitive Skin Formula  Keri Lotion Fast Absorbing Softly Scented Dry Skin Formula  Keri Original Lotion  Keri Skin Renewal Lotion Keri Silky Smooth Lotion  Keri Silky Smooth Sensitive Skin Lotion  Nivea Body Creamy Conditioning Oil  Nivea Body Extra Enriched Lotion  Nivea Body Original Lotion  Nivea Body Sheer Moisturizing Lotion Nivea Crme  Nivea Skin Firming Lotion  NutraDerm 30 Skin Lotion  NutraDerm Skin Lotion  NutraDerm Therapeutic Skin Cream  NutraDerm Therapeutic Skin Lotion  ProShield Protective Hand Cream  Provon moisturizing lotion    If you received a COVID test during your pre-op visit  it is requested that you wear a mask when out in public, stay away from anyone that may not be feeling well and notify your surgeon if you develop symptoms. If you have been in contact with anyone that has tested positive in the last 10 days please notify you  surgeon.

## 2022-11-15 ENCOUNTER — Encounter (HOSPITAL_COMMUNITY)
Admission: RE | Admit: 2022-11-15 | Discharge: 2022-11-15 | Disposition: A | Discharge status: Home / Self Care | Payer: Medicare PPO | Source: Ambulatory Visit | Attending: Orthopaedic Surgery | Admitting: Orthopaedic Surgery

## 2022-11-15 ENCOUNTER — Encounter (HOSPITAL_COMMUNITY): Payer: Self-pay

## 2022-11-15 ENCOUNTER — Other Ambulatory Visit: Payer: Self-pay

## 2022-11-15 VITALS — BP 141/80 | HR 54 | Temp 98.2°F | Resp 17 | Ht 69.0 in | Wt 203.0 lb

## 2022-11-15 DIAGNOSIS — E7800 Pure hypercholesterolemia, unspecified: Secondary | ICD-10-CM | POA: Insufficient documentation

## 2022-11-15 DIAGNOSIS — I498 Other specified cardiac arrhythmias: Secondary | ICD-10-CM | POA: Insufficient documentation

## 2022-11-15 DIAGNOSIS — M1711 Unilateral primary osteoarthritis, right knee: Secondary | ICD-10-CM | POA: Insufficient documentation

## 2022-11-15 DIAGNOSIS — R0989 Other specified symptoms and signs involving the circulatory and respiratory systems: Secondary | ICD-10-CM | POA: Insufficient documentation

## 2022-11-15 DIAGNOSIS — Z01818 Encounter for other preprocedural examination: Secondary | ICD-10-CM | POA: Insufficient documentation

## 2022-11-15 DIAGNOSIS — I110 Hypertensive heart disease with heart failure: Secondary | ICD-10-CM | POA: Diagnosis not present

## 2022-11-15 DIAGNOSIS — I509 Heart failure, unspecified: Secondary | ICD-10-CM | POA: Diagnosis not present

## 2022-11-15 DIAGNOSIS — I2510 Atherosclerotic heart disease of native coronary artery without angina pectoris: Secondary | ICD-10-CM | POA: Diagnosis not present

## 2022-11-15 DIAGNOSIS — R0609 Other forms of dyspnea: Secondary | ICD-10-CM | POA: Insufficient documentation

## 2022-11-15 HISTORY — DX: Malignant (primary) neoplasm, unspecified: C80.1

## 2022-11-15 LAB — CBC
HCT: 40 % (ref 39.0–52.0)
Hemoglobin: 13.1 g/dL (ref 13.0–17.0)
MCH: 31.8 pg (ref 26.0–34.0)
MCHC: 32.8 g/dL (ref 30.0–36.0)
MCV: 97.1 fL (ref 80.0–100.0)
Platelets: 200 10*3/uL (ref 150–400)
RBC: 4.12 MIL/uL — ABNORMAL LOW (ref 4.22–5.81)
RDW: 12.9 % (ref 11.5–15.5)
WBC: 6.4 10*3/uL (ref 4.0–10.5)
nRBC: 0 % (ref 0.0–0.2)

## 2022-11-15 LAB — COMPREHENSIVE METABOLIC PANEL
ALT: 18 U/L (ref 0–44)
AST: 22 U/L (ref 15–41)
Albumin: 3.7 g/dL (ref 3.5–5.0)
Alkaline Phosphatase: 65 U/L (ref 38–126)
Anion gap: 10 (ref 5–15)
BUN: 10 mg/dL (ref 8–23)
CO2: 28 mmol/L (ref 22–32)
Calcium: 9.3 mg/dL (ref 8.9–10.3)
Chloride: 102 mmol/L (ref 98–111)
Creatinine, Ser: 0.9 mg/dL (ref 0.61–1.24)
GFR, Estimated: >60 mL/min (ref >60)
Glucose, Bld: 98 mg/dL (ref 70–99)
Potassium: 4.1 mmol/L (ref 3.5–5.1)
Sodium: 140 mmol/L (ref 135–145)
Total Bilirubin: 0.6 mg/dL (ref 0.3–1.2)
Total Protein: 6.9 g/dL (ref 6.5–8.1)

## 2022-11-15 LAB — SURGICAL PCR SCREEN
MRSA, PCR: NEGATIVE
Staphylococcus aureus: POSITIVE — AB

## 2022-11-15 NOTE — Progress Notes (Signed)
PCP - Dr. Sigmund Hazel Cardiologist - Dr. Marca Ancona  PPM/ICD - Denies Device Orders - n/a Rep Notified - n/a  Chest x-ray - 02/12/2022 EKG - 10/17/2022 Stress Test - 02/28/2022 ECHO - 12/28/2021 Cardiac Cath - 02/21/2022  Sleep Study - Denies CPAP - n/a  No DM  Last dose of GLP1 agonist- n/a GLP1 instructions: n/a  Blood Thinner Instructions: n/a Aspirin Instructions: Pt instructed to call Dr. Eliberto Ivory office today to receive ASA instructions.  ERAS Protcol - Clear liquids until 1045 morning of surgery PRE-SURGERY Ensure or G2- Ensure given to pt with instructions.  COVID TEST- n/a   Anesthesia review: Yes. Cardiac Clearance. Pt also instructed to bring his Spinal Cord Stimulator Remote with him DOS.   Patient denies shortness of breath, fever, cough and chest pain at PAT appointment. Pt denies any respiratory illness/infection in the last two months.   All instructions explained to the patient, with a verbal understanding of the material. Patient agrees to go over the instructions while at home for a better understanding. Patient also instructed to self quarantine after being tested for COVID-19. The opportunity to ask questions was provided.

## 2022-11-16 ENCOUNTER — Encounter (HOSPITAL_COMMUNITY): Payer: Self-pay

## 2022-11-16 NOTE — Progress Notes (Signed)
Anesthesia Chart Review:  Case: [id number] Date/Time: 11/21/22 1330   Procedure: RIGHT TOTAL KNEE ARTHROPLASTY (Right: Knee)   Anesthesia type: Choice   Pre-op diagnosis: endstage osteoarthritis right knee   [facility]OM 07 / [facility]   Surgeons: Kathryne Hitch, MD       DISCUSSION: Patient is a 78 year old male scheduled for the above procedure.   History includes never smoker, HTN, hypercholesterolemia, CAD (DES pRCA 10/12/15), chronic HFmEF (LVEF 40-45% 12/2021), skin cancer (s/p excision from back), spinal surgery (ACDF ~ 2005; L2-5 TLIF, L5-S1 posterolateral fusion 02/08/07; Spectra spinal cord stimulator insertion 05/08/14), osteoarthritis (left TKA 04/19/10), BPH (UroLift x6 implants 05/02/21), glaucoma (s/p surgery). Previous heavy alcohol intake, currently 3-4 glasses wine/week.  HF follow-up on 10/17/22 with Prince Rome, FNP. LVEF 40-45% in June 2023 (previously 45-50% in July 2021), mild LVH, grade II diastolic dysfunction, normal RVSF, mild-moderate MR. He does have a DES RCA from 2017 but patent by 12/2021 [facility] (for false positive stress test). Cardiomyopathy not felt to be ischemic. Unable to obtain cMRI due to spinal cord stimulator. RHC in 01/2022 after augmented diuresis showed normal filling pressures and cardiac output. CPX in 01/2022 showed only mild functional limitation from CHF. Continue medical therapy recommended. Plan to update his echo prior to his 3-4 month follow-up visit. She noted surgery plans. Dr. Shirlee Latch has already cleared him for surgery on 09/18/22.  He was instructed to bring his SCS remote with him on the day of surgery.   Anesthesia team to evaluate on the day of surgery.    VS: BP (!) 141/80   Pulse (!) 54   Temp 36.8 C   Resp 17   Ht 5\' 9"  (1.753 m)   Wt 92.1 kg   SpO2 98%   BMI 29.98 kg/m    PROVIDERS: Sigmund Hazel, MD is PCP  - Marca Ancona, MD is HF cardiologist - Tessa Lerner, DO is primary cardiologist. Last visit 01/27/22. He  [facility] chronic combined [facility]F.  - Durel Salts, MD is pulmonologist. Evaluation 02/23/22. She thought breathing issues were more cardiac related and had showed some improvement with medical management. She did order PFTs that showed "some mild bronchial hyper-responsiveness which means that using an as needed albuterol inhaler could be helpful if he still has ongoing shortness of breath. Otherwise unremarkable. He was feeling better when he saw me hence we did not start any inhaler therapy. If he is still having symptoms of shortness of breath can have him try an albuterol inhaler and be seen in follow up with APP." - Wilkie Aye, MD is urologist    LABS: Labs reviewed: Acceptable for surgery. (all labs ordered are listed, but only abnormal results are displayed)  Labs Reviewed  SURGICAL PCR SCREEN - Abnormal; Notable for the following components:      Result Value   Staphylococcus aureus POSITIVE (*)    All other components within normal limits  CBC - Abnormal; Notable for the following components:   RBC 4.12 (*)    All other components within normal limits  COMPREHENSIVE METABOLIC PANEL    PFTs 04/04/22: FVC 3.08 (76%), post 3.23 (80%). FEV1 2.57 (88%), post 2.77 (95%). DLCO unc/cor 16.86 (69%)   IMAGES: 1V PCXR 02/12/22: IMPRESSION: Cardiomegaly, pulmonary vascular congestion and mild interstitial edema of mild congestive heart failure and has improved in the interim.   EKG: 10/17/22: Normal sinus rhythm with sinus arrhythmia Incomplete left bundle branch block  Borderline ECG When compared with ECG of 20-Jun-2022 11:44, No significant change was found Confirmed by [place], Peter [phone]) on 10/17/2022 7:24:03 PM   CV: RHC 02/21/22: 1. Normal filling pressures.  2. No significant pulmonary hypertension.  3. Normal cardiac output.  No change to current diuretic dose, he appears well-compensated from CHF standpoint.     CPX  02/28/22: Conclusion: Exercise testing with gas exchange demonstrates normal functional capacity when compared to matched sedentary norms. Although PVO2 within normal range, VE/VCO2 slope is more suggestive of a mild HF limitation or mildly elevated pulmonary pressures with exercise. Patient's body habitus appears to also be playing a role as PVO2 improves almost 10% with corrections for ideal body weight.  (Preliminary impression by: Reggy Eye, MS, ACSM-RCEP) Attending: Low normal functional capacity with mildly elevated VE/VCO2. Suspect mild functional limitation due to HF and patient's body habitus.  Arvilla Meres, MD)    [facility] 01/17/22: LV: 100/14, EDP 14 mmHg.  Ao 114/53, mean 76 mm Hg. No pressure gradient across the aortic valve. LM: Large caliber vessel, it trifurcates. LAD: Large caliber vessel giving origin to a very large D1.  There is mild diffuse coronary calcification noted in the proximal and mid segment.  At the origin of the D2 scratch that at the origin of D1, there is mild calcific 10 to 20% stenosis.  Otherwise the LAD has minimal disease. CX: Moderate caliber vessel giving origin to a moderate-sized OM1, smooth and normal with minimal disease. RI: Moderate caliber vessel.  Has a secondary branch.  Has no significant disease.  Minimal luminal abnormality noted. RCA: Large-caliber vessel.  Proximal right coronary artery stent, placed 10/12/2015:  4.0 x 18 mm resolute DES widely patent.  There is mild luminal irregularity in the mid RCA and distal RCA. - Impression: Dyspnea on exertion probably noncardiac related.  False positive stress test.     Lexiscan Tetrofosmin stress test 01/11/22: Abnormal nuclear stress test 1 Day Rest/Stress protocol.  Stress EKG is non-diagnostic -target heart rate not achieved. Medium size, mild to moderate intensity reversible perfusion defect involving the basal to distal inferior segments suggestive of ischemia in the RCA distribution.    Small size, mild to moderate intensity reversible perfusion defect in the basal to mid inferoseptal segments suggestive of ischemia in the RCA distribution. Left ventricular size dilated, calculated LVEF 24%, wall motion severely reduced with global and regional wall motion abnormality. Prior study 01/19/2020 reported normal myocardial perfusion, calculated LVEF 44% but visually appears normal. High risk study.  Clinical correlation required.   Echocardiogram 12/28/21: IMPRESSIONS   1. Left ventricular ejection fraction, by estimation, is 40 to 45%. The  left ventricle has mildly decreased function. The left ventricle  demonstrates regional wall motion abnormalities (see scoring  diagram/findings for description). There is mild left  ventricular hypertrophy. Left ventricular diastolic parameters are  consistent with Grade II diastolic dysfunction (pseudonormalization).  Elevated left atrial pressure. The E/e' is 19.5. The average left  ventricular global longitudinal strain is -11.6 %.  The global longitudinal strain is abnormal.   2. Right ventricular systolic function is normal. The right ventricular  size is normal.   3. Left atrial size was severely dilated.   4. The mitral valve is grossly normal. Mild to moderate mitral valve  regurgitation. No evidence of mitral stenosis.   5. The aortic valve is tricuspid. Aortic valve regurgitation is not  visualized. Aortic valve sclerosis is present, with no evidence of aortic  valve stenosis.  - Comparison(s):  A prior study was performed on 01/19/2020. LVEF 45-50%, Grade I diastolic dysfunction, normal LAP, moderate LAE, Mild AR, Moderate MR.     Past Medical History:  Diagnosis Date   Arthritis    "all over"   Cancer [facility])    Skin Cancer removed off back   CHF (congestive heart failure) (HCC)    Chronic lower back pain    Coronary artery disease    DES pRCA 10/12/15   Early cataracts, bilateral    Family history of adverse  reaction to anesthesia    "son had ligament repair of elbow in 2016; woke up nauseated and disoriented"   Hypercholesterolemia    Hypertension    Migraine    Restless leg syndrome    Wears glasses     Past Surgical History:  Procedure Laterality Date   ANTERIOR CERVICAL DECOMP/DISCECTOMY FUSION  ~ 2005   ANTERIOR CERVICAL DISCECTOMY     BACK SURGERY     CARDIAC CATHETERIZATION N/A 10/12/2015   Procedure: Left Heart Cath and Coronary Angiography;  Surgeon: Yates Decamp, MD;  Location: [facility] INVASIVE CV LAB;  Service: Cardiovascular;  Laterality: N/A;   CARDIAC CATHETERIZATION  10/12/2015   Procedure: Coronary Stent Intervention;  Surgeon: Yates Decamp, MD;  Location: [facility] INVASIVE CV LAB;  Service: Cardiovascular;;   COLONOSCOPY     CORONARY ANGIOPLASTY  10/12/2015   DES RCA   CYSTOSCOPY WITH INSERTION OF UROLIFT N/A 05/02/2021   Procedure: CYSTOSCOPY WITH INSERTION OF UROLIFT;  Surgeon: Malen Gauze, MD;  Location: [facility] ORS;  Service: Urology;  Laterality: N/A;   EYE SURGERY Left    cataract   FRACTURE SURGERY     nose repaired from accident   GLAUCOMA SURGERY Left 03/04/2015   JOINT REPLACEMENT     KNEE ARTHROSCOPY Bilateral    LEFT HEART CATH AND CORONARY ANGIOGRAPHY N/A 01/17/2022   Procedure: LEFT HEART CATH AND CORONARY ANGIOGRAPHY;  Surgeon: Yates Decamp, MD;  Location: [facility] INVASIVE CV LAB;  Service: Cardiovascular;  Laterality: N/A;   LUMBAR DISC SURGERY  ~ 1986-02/08/2007 X 6   MAXIMUM ACCESS (MAS)POSTERIOR LUMBAR INTERBODY FUSION (PLIF) 3 LEVEL  02/08/2007   w/rods and screws   RIGHT HEART CATH N/A 02/21/2022   Procedure: RIGHT HEART CATH;  Surgeon: Laurey Morale, MD;  Location: [facility] INVASIVE CV LAB;  Service: Cardiovascular;  Laterality: N/A;   SPINAL CORD STIMULATOR INSERTION N/A 05/08/2014   Procedure: LUMBAR SPINAL CORD STIMULATOR IMPLANT ;  Surgeon: Gwynne Edinger, MD;  Location: [facility] NEURO ORS;  Service: Neurosurgery;  Laterality: N/A;   TOE FUSION Right 06/02/2014   "outside  part of big toe"   TOTAL KNEE ARTHROPLASTY Left ~ 2011    MEDICATIONS:  aspirin EC 81 MG tablet   bisoprolol (ZEBETA) 5 MG tablet   Cholecalciferol 25 MCG (1000 UT) tablet   diclofenac Sodium (VOLTAREN) 1 % GEL   DULoxetine (CYMBALTA) 20 MG capsule   empagliflozin (JARDIANCE) 10 MG TABS tablet   furosemide (LASIX) 40 MG tablet   gabapentin (NEURONTIN) 300 MG capsule   Ibuprofen-Acetaminophen (ADVIL DUAL ACTION) 125-250 MG TABS   lubiprostone (AMITIZA) 8 MCG capsule   methocarbamol (ROBAXIN) 750 MG tablet   nitroGLYCERIN (NITROSTAT) 0.4 MG SL tablet   oxymetazoline (AFRIN) 0.05 % nasal spray   pyridoxine (B-6) 100 MG tablet   rOPINIRole (REQUIP) 2 MG tablet   rosuvastatin (CRESTOR) 40 MG tablet   sacubitril-valsartan (ENTRESTO) 49-51 MG   spironolactone (ALDACTONE) 25 MG tablet   tadalafil (CIALIS) 20  MG tablet   tadalafil (CIALIS) 5 MG tablet   tamsulosin (FLOMAX) 0.4 MG CAPS capsule   traZODone (DESYREL) 100 MG tablet   vitamin C (ASCORBIC ACID) 250 MG tablet   No current facility-administered medications for this encounter.   Instructed at PAT to hold Jardiance for 3 days prior to surgery.    Shonna Chock, PA-C Surgical Short Stay/Anesthesiology [facility] Phone [phone] [facility] Phone [phone] 11/16/2022 5:46 PM

## 2022-11-16 NOTE — Anesthesia Preprocedure Evaluation (Addendum)
Anesthesia Evaluation  Patient identified by MRN, date of birth, ID band Patient awake    Reviewed: Allergy & Precautions, NPO status , Patient's Chart, lab work & pertinent test results, reviewed documented beta blocker date and time   History of Anesthesia Complications (+) Family history of anesthesia reaction  Airway Mallampati: II  TM Distance: >3 FB     Dental no notable dental hx. (+) Caps, Dental Advisory Given   Pulmonary    Pulmonary exam normal breath sounds clear to auscultation       Cardiovascular hypertension, Pt. on medications and Pt. on home beta blockers + CAD, + Cardiac Stents and +CHF  Normal cardiovascular exam Rhythm:Regular Rate:Normal  DES pRCA 10/2015  EKG 10/17/22 NSR with sinus arrhythmia, incomplete LBBB pattern  Echo 12/28/21 1. Left ventricular ejection fraction, by estimation, is 40 to 45%. The  left ventricle has mildly decreased function. The left ventricle  demonstrates regional wall motion abnormalities (see scoring  diagram/findings for description). There is mild left  ventricular hypertrophy. Left ventricular diastolic parameters are  consistent with Grade II diastolic dysfunction (pseudonormalization).  Elevated left atrial pressure. The E/e' is 19.5. The average left  ventricular global longitudinal strain is -11.6 %.  The global longitudinal strain is abnormal.   2. Right ventricular systolic function is normal. The right ventricular  size is normal.   3. Left atrial size was severely dilated.   4. The mitral valve is grossly normal. Mild to moderate mitral valve  regurgitation. No evidence of mitral stenosis.   5. The aortic valve is tricuspid. Aortic valve regurgitation is not  visualized. Aortic valve sclerosis is present, with no evidence of aortic  valve stenosis.   Stress Test 02/28/22 Exercise testing with gas exchange demonstrates normal functional capacity when compared to  matched sedentary norms. Although PVO2 within normal range, VE/VCO2 slope is more suggestive of a mild HF limitation or mildly elevated pulmonary pressures with exercise. Patient's body habitus appears to also be playing a role as PVO2 improves almost 10% with corrections for ideal body weight.   Cardiac Cath 02/21/22 Right Heart Pressures RHC Procedural Findings: Hemodynamics (mmHg) RA mean 1 RV 31/9 PA 33/13, mean 22 PCWP mean 11  Oxygen saturations: PA 75% AO 94%  Cardiac Output (Fick) 8.17  Cardiac Index (Fick) 4.08       Neuro/Psych  Headaches  negative psych ROS   GI/Hepatic negative GI ROS, Neg liver ROS,,,  Endo/Other  diabetes, Well Controlled, Type 2, Oral Hypoglycemic Agents  Hyperlipidemia  Renal/GU negative Renal ROS  negative genitourinary   Musculoskeletal  (+) Arthritis , Osteoarthritis,  OA right knee   Abdominal   Peds  Hematology negative hematology ROS (+)   Anesthesia Other Findings   Reproductive/Obstetrics                              Anesthesia Physical Anesthesia Plan  ASA: 3  Anesthesia Plan: Spinal   Post-op Pain Management: Minimal or no pain anticipated   Induction: Intravenous  PONV Risk Score and Plan: 2 and Propofol infusion and Treatment may vary due to age or medical condition  Airway Management Planned: Natural Airway and Simple Face Mask  Additional Equipment: None  Intra-op Plan:   Post-operative Plan:   Informed Consent: I have reviewed the patients History and Physical, chart, labs and discussed the procedure including the risks, benefits and alternatives for the proposed anesthesia with the patient or authorized representative  who has indicated his/her understanding and acceptance.       Plan Discussed with: Anesthesiologist and CRNA  Anesthesia Plan Comments: (PAT note written 11/16/2022 by Shonna Chock, PA-C.  )        Anesthesia Quick Evaluation

## 2022-11-20 NOTE — H&P (Signed)
TOTAL KNEE ADMISSION H&P  Patient is being admitted for right total knee arthroplasty.  Subjective:  Chief Complaint:right knee pain.  HPI: Brian Gallegos, 77 y.o. male, has a history of pain and functional disability in the right knee due to arthritis and has failed non-surgical conservative treatments for greater than 12 weeks to includeNSAID's and/or analgesics, corticosteriod injections, viscosupplementation injections, flexibility and strengthening excercises, use of assistive devices, and activity modification.  Onset of symptoms was gradual, starting 5 years ago with gradually worsening course since that time. The patient noted prior procedures on the knee to include  arthroscopy on the right knee(s).  Patient currently rates pain in the right knee(s) at 10 out of 10 with activity. Patient has night pain, worsening of pain with activity and weight bearing, pain that interferes with activities of daily living, pain with passive range of motion, crepitus, and joint swelling.  Patient has evidence of severe arthritis by imaging studies. There is no active infection.  Patient Active Problem List   Diagnosis Date Noted   Chronic combined systolic and diastolic heart failure (HCC) 02/08/2022   Coronary artery disease    Unilateral primary osteoarthritis, right knee 08/30/2021   Erectile dysfunction due to arterial insufficiency 07/30/2020   Benign prostatic hyperplasia with urinary obstruction 06/14/2020   Urinary retention 06/14/2020   Post PTCA 10/12/2015   Abnormal cardiovascular stress test 10/11/2015   Atypical chest pain 10/11/2015   Past Medical History:  Diagnosis Date   Arthritis    "all over"   Cancer [facility])    Skin Cancer removed off back   CHF (congestive heart failure) (HCC)    Chronic lower back pain    Coronary artery disease    DES pRCA 10/12/15   Early cataracts, bilateral    Family history of adverse reaction to anesthesia    "son had ligament repair of elbow in  2016; woke up nauseated and disoriented"   Hypercholesterolemia    Hypertension    Migraine    Restless leg syndrome    Wears glasses     Past Surgical History:  Procedure Laterality Date   ANTERIOR CERVICAL DECOMP/DISCECTOMY FUSION  ~ 2005   ANTERIOR CERVICAL DISCECTOMY     BACK SURGERY     CARDIAC CATHETERIZATION N/A 10/12/2015   Procedure: Left Heart Cath and Coronary Angiography;  Surgeon: Yates Decamp, MD;  Location: [facility] INVASIVE CV LAB;  Service: Cardiovascular;  Laterality: N/A;   CARDIAC CATHETERIZATION  10/12/2015   Procedure: Coronary Stent Intervention;  Surgeon: Yates Decamp, MD;  Location: [facility] INVASIVE CV LAB;  Service: Cardiovascular;;   COLONOSCOPY     CORONARY ANGIOPLASTY  10/12/2015   DES RCA   CYSTOSCOPY WITH INSERTION OF UROLIFT N/A 05/02/2021   Procedure: CYSTOSCOPY WITH INSERTION OF UROLIFT;  Surgeon: Malen Gauze, MD;  Location: [facility] ORS;  Service: Urology;  Laterality: N/A;   EYE SURGERY Left    cataract   FRACTURE SURGERY     nose repaired from accident   GLAUCOMA SURGERY Left 03/04/2015   JOINT REPLACEMENT     KNEE ARTHROSCOPY Bilateral    LEFT HEART CATH AND CORONARY ANGIOGRAPHY N/A 01/17/2022   Procedure: LEFT HEART CATH AND CORONARY ANGIOGRAPHY;  Surgeon: Yates Decamp, MD;  Location: [facility] INVASIVE CV LAB;  Service: Cardiovascular;  Laterality: N/A;   LUMBAR DISC SURGERY  ~ 1986-02/08/2007 X 6   MAXIMUM ACCESS (MAS)POSTERIOR LUMBAR INTERBODY FUSION (PLIF) 3 LEVEL  02/08/2007   w/rods and screws   RIGHT HEART CATH N/A  02/21/2022   Procedure: RIGHT HEART CATH;  Surgeon: Laurey Morale, MD;  Location: [facility] INVASIVE CV LAB;  Service: Cardiovascular;  Laterality: N/A;   SPINAL CORD STIMULATOR INSERTION N/A 05/08/2014   Procedure: LUMBAR SPINAL CORD STIMULATOR IMPLANT ;  Surgeon: Gwynne Edinger, MD;  Location: [facility] NEURO ORS;  Service: Neurosurgery;  Laterality: N/A;   TOE FUSION Right 06/02/2014   "outside part of big toe"   TOTAL KNEE ARTHROPLASTY Left ~ 2011    No  current facility-administered medications for this encounter.   Current Outpatient Medications  Medication Sig Dispense Refill Last Dose   aspirin EC 81 MG tablet Take 81 mg by mouth in the morning.      bisoprolol (ZEBETA) 5 MG tablet Take 0.5 tablets (2.5 mg total) by mouth daily. 45 tablet 3    Cholecalciferol 25 MCG (1000 UT) tablet Take 1,000 Units by mouth in the morning.      diclofenac Sodium (VOLTAREN) 1 % GEL Apply 1 Application topically as needed (pain).      empagliflozin (JARDIANCE) 10 MG TABS tablet TAKE 1 TABLET(10 MG) BY MOUTH EVERY MORNING 90 tablet 1    furosemide (LASIX) 40 MG tablet Take 40 mg by mouth daily.      gabapentin (NEURONTIN) 300 MG capsule TAKE 1 CAPSULE(300 MG) BY MOUTH AT BEDTIME 60 capsule 1    Ibuprofen-Acetaminophen (ADVIL DUAL ACTION) 125-250 MG TABS Take 2 tablets by mouth every 8 (eight) hours as needed (pain).      lubiprostone (AMITIZA) 8 MCG capsule Take 8 mcg by mouth daily.      methocarbamol (ROBAXIN) 750 MG tablet Take 750 mg by mouth at bedtime.      oxymetazoline (AFRIN) 0.05 % nasal spray Place 1 spray into both nostrils 2 (two) times daily as needed for congestion.      pyridoxine (B-6) 100 MG tablet Take 100 mg by mouth in the morning.      rOPINIRole (REQUIP) 2 MG tablet Take 4 mg by mouth See admin instructions. Take 4 mg in the afternoon and 4 mg at bedtime, may take a third 4 mg dose as needed for restless legs      rosuvastatin (CRESTOR) 40 MG tablet Take 20 mg by mouth at bedtime.      sacubitril-valsartan (ENTRESTO) 49-51 MG Take 1 tablet by mouth 2 (two) times daily. 180 tablet 3    spironolactone (ALDACTONE) 25 MG tablet Take 1 tablet (25 mg total) by mouth daily. 45 tablet 2    tadalafil (CIALIS) 20 MG tablet Take 1 tablet (20 mg total) by mouth daily as needed for erectile dysfunction. 10 tablet 3    tadalafil (CIALIS) 5 MG tablet Take 1 tablet (5 mg total) by mouth daily. (Patient taking differently: Take 5 mg by mouth at bedtime.)  30 tablet 11    tamsulosin (FLOMAX) 0.4 MG CAPS capsule TAKE 1 CAPSULE(0.4 MG) BY MOUTH IN THE MORNING AND AT BEDTIME 60 capsule 11    traZODone (DESYREL) 100 MG tablet Take 100 mg by mouth at bedtime.      vitamin C (ASCORBIC ACID) 250 MG tablet Take 250 mg by mouth daily.      DULoxetine (CYMBALTA) 20 MG capsule TAKE 1 CAPSULE(20 MG) BY MOUTH DAILY (Patient not taking: Reported on 11/14/2022) 30 capsule 3 Not Taking   nitroGLYCERIN (NITROSTAT) 0.4 MG SL tablet Place 1 tablet (0.4 mg total) under the tongue every 5 (five) minutes as needed for chest pain. If you require more  than two tablets five minutes apart go to the nearest ER via EMS. (Patient not taking: Reported on 11/14/2022) 30 tablet 0 Not Taking   Allergies  Allergen Reactions   Dulcolax [Bisacodyl] Other (See Comments)    Severe stomach cramps   Codeine Itching    Low amount is tolerated   Proscar [Finasteride] Hives and Rash    Social History   Tobacco Use   Smoking status: Never   Smokeless tobacco: Never  Substance Use Topics   Alcohol use: Yes    Alcohol/week: 4.0 - 5.0 standard drinks of alcohol    Types: 4 - 5 Glasses of wine per week    Family History  Problem Relation Age of Onset   Aneurysm Mother    Heart attack Father    Hypertension Father    CAD Father    Cancer Other      Review of Systems  Objective:  Physical Exam Vitals reviewed.  Constitutional:      Appearance: Normal appearance. He is normal weight.  HENT:     Head: Normocephalic and atraumatic.  Eyes:     Extraocular Movements: Extraocular movements intact.     Pupils: Pupils are equal, round, and reactive to light.  Cardiovascular:     Rate and Rhythm: Normal rate.     Pulses: Normal pulses.  Pulmonary:     Effort: Pulmonary effort is normal.  Abdominal:     Palpations: Abdomen is soft.  Musculoskeletal:     Cervical back: Normal range of motion and neck supple.     Right knee: Effusion, bony tenderness and crepitus present.  Decreased range of motion. Tenderness present over the medial joint line and lateral joint line. Abnormal alignment and abnormal meniscus.  Neurological:     Mental Status: He is alert and oriented to person, place, and time.  Psychiatric:        Behavior: Behavior normal.     Vital signs in last 24 hours:    Labs:   Estimated body mass index is 29.98 kg/m as calculated from the following:   Height as of 11/15/22: 5\' 9"  (1.753 m).   Weight as of 11/15/22: 92.1 kg.   Imaging Review Plain radiographs demonstrate severe degenerative joint disease of the right knee(s). The overall alignment ismild varus. The bone quality appears to be good for age and reported activity level.      Assessment/Plan:  End stage arthritis, right knee   The patient history, physical examination, clinical judgment of the provider and imaging studies are consistent with end stage degenerative joint disease of the right knee(s) and total knee arthroplasty is deemed medically necessary. The treatment options including medical management, injection therapy arthroscopy and arthroplasty were discussed at length. The risks and benefits of total knee arthroplasty were presented and reviewed. The risks due to aseptic loosening, infection, stiffness, patella tracking problems, thromboembolic complications and other imponderables were discussed. The patient acknowledged the explanation, agreed to proceed with the plan and consent was signed. Patient is being admitted for inpatient treatment for surgery, pain control, PT, OT, prophylactic antibiotics, VTE prophylaxis, progressive ambulation and ADL's and discharge planning. The patient is planning to be discharged home with home health services

## 2022-11-21 ENCOUNTER — Inpatient Hospital Stay (HOSPITAL_COMMUNITY)
Admission: AD | Admit: 2022-11-21 | Discharge: 2022-11-23 | DRG: 470 | Disposition: A | Discharge status: Home Health | Payer: Medicare PPO | Attending: Orthopaedic Surgery | Admitting: Orthopaedic Surgery

## 2022-11-21 ENCOUNTER — Observation Stay (HOSPITAL_COMMUNITY): Payer: Medicare PPO

## 2022-11-21 ENCOUNTER — Encounter (HOSPITAL_COMMUNITY): Payer: Self-pay | Admitting: Orthopaedic Surgery

## 2022-11-21 ENCOUNTER — Ambulatory Visit (HOSPITAL_BASED_OUTPATIENT_CLINIC_OR_DEPARTMENT_OTHER): Payer: Medicare PPO | Admitting: Certified Registered"

## 2022-11-21 ENCOUNTER — Encounter (HOSPITAL_COMMUNITY)
Admission: AD | Disposition: A | Discharge status: Home Health | Payer: Self-pay | Source: Home / Self Care | Attending: Orthopaedic Surgery

## 2022-11-21 ENCOUNTER — Ambulatory Visit (HOSPITAL_COMMUNITY): Payer: Medicare PPO | Admitting: Physician Assistant

## 2022-11-21 ENCOUNTER — Other Ambulatory Visit: Payer: Self-pay

## 2022-11-21 DIAGNOSIS — Z981 Arthrodesis status: Secondary | ICD-10-CM | POA: Diagnosis not present

## 2022-11-21 DIAGNOSIS — E119 Type 2 diabetes mellitus without complications: Secondary | ICD-10-CM | POA: Diagnosis present

## 2022-11-21 DIAGNOSIS — Z85828 Personal history of other malignant neoplasm of skin: Secondary | ICD-10-CM

## 2022-11-21 DIAGNOSIS — I2510 Atherosclerotic heart disease of native coronary artery without angina pectoris: Secondary | ICD-10-CM | POA: Diagnosis present

## 2022-11-21 DIAGNOSIS — E7800 Pure hypercholesterolemia, unspecified: Secondary | ICD-10-CM | POA: Diagnosis present

## 2022-11-21 DIAGNOSIS — M1711 Unilateral primary osteoarthritis, right knee: Secondary | ICD-10-CM

## 2022-11-21 DIAGNOSIS — Z7984 Long term (current) use of oral hypoglycemic drugs: Secondary | ICD-10-CM

## 2022-11-21 DIAGNOSIS — Z885 Allergy status to narcotic agent status: Secondary | ICD-10-CM

## 2022-11-21 DIAGNOSIS — Z955 Presence of coronary angioplasty implant and graft: Secondary | ICD-10-CM | POA: Diagnosis not present

## 2022-11-21 DIAGNOSIS — Z888 Allergy status to other drugs, medicaments and biological substances status: Secondary | ICD-10-CM

## 2022-11-21 DIAGNOSIS — Z9682 Presence of neurostimulator: Secondary | ICD-10-CM

## 2022-11-21 DIAGNOSIS — M25761 Osteophyte, right knee: Secondary | ICD-10-CM | POA: Diagnosis present

## 2022-11-21 DIAGNOSIS — I5042 Chronic combined systolic (congestive) and diastolic (congestive) heart failure: Secondary | ICD-10-CM | POA: Diagnosis present

## 2022-11-21 DIAGNOSIS — Z471 Aftercare following joint replacement surgery: Secondary | ICD-10-CM | POA: Diagnosis not present

## 2022-11-21 DIAGNOSIS — I509 Heart failure, unspecified: Secondary | ICD-10-CM

## 2022-11-21 DIAGNOSIS — Z96652 Presence of left artificial knee joint: Secondary | ICD-10-CM | POA: Diagnosis present

## 2022-11-21 DIAGNOSIS — G8918 Other acute postprocedural pain: Secondary | ICD-10-CM | POA: Diagnosis not present

## 2022-11-21 DIAGNOSIS — Z96651 Presence of right artificial knee joint: Secondary | ICD-10-CM

## 2022-11-21 DIAGNOSIS — Z79899 Other long term (current) drug therapy: Secondary | ICD-10-CM | POA: Diagnosis not present

## 2022-11-21 DIAGNOSIS — E785 Hyperlipidemia, unspecified: Secondary | ICD-10-CM | POA: Diagnosis present

## 2022-11-21 DIAGNOSIS — G2581 Restless legs syndrome: Secondary | ICD-10-CM | POA: Diagnosis present

## 2022-11-21 DIAGNOSIS — I110 Hypertensive heart disease with heart failure: Secondary | ICD-10-CM | POA: Diagnosis present

## 2022-11-21 DIAGNOSIS — H269 Unspecified cataract: Secondary | ICD-10-CM | POA: Diagnosis present

## 2022-11-21 DIAGNOSIS — Z8249 Family history of ischemic heart disease and other diseases of the circulatory system: Secondary | ICD-10-CM

## 2022-11-21 DIAGNOSIS — N401 Enlarged prostate with lower urinary tract symptoms: Secondary | ICD-10-CM | POA: Diagnosis present

## 2022-11-21 HISTORY — PX: TOTAL KNEE ARTHROPLASTY: SHX125

## 2022-11-21 SURGERY — ARTHROPLASTY, KNEE, TOTAL
Anesthesia: Spinal | Site: Knee | Laterality: Right

## 2022-11-21 MED ORDER — METHOCARBAMOL 500 MG PO TABS
500.0000 mg | ORAL_TABLET | Freq: Four times a day (QID) | ORAL | Status: DC | PRN
  Administered 2022-11-21 – 2022-11-23 (×4): 500 mg via ORAL
  Filled 2022-11-21 (×5): qty 1

## 2022-11-21 MED ORDER — ALUM & MAG HYDROXIDE-SIMETH 200-200-20 MG/5ML PO SUSP: 30.0000 mL | ORAL | Status: DC | PRN

## 2022-11-21 MED ORDER — PROPOFOL 10 MG/ML IV BOLUS
INTRAVENOUS | Status: AC
  Filled 2022-11-21: qty 20

## 2022-11-21 MED ORDER — LACTATED RINGERS IV SOLN: INTRAVENOUS | Status: DC

## 2022-11-21 MED ORDER — METOCLOPRAMIDE HCL 5 MG/ML IJ SOLN: 5.0000 mg | Freq: Three times a day (TID) | INTRAMUSCULAR | Status: DC | PRN

## 2022-11-21 MED ORDER — KETAMINE HCL 50 MG/5ML IJ SOSY
PREFILLED_SYRINGE | INTRAMUSCULAR | Status: AC
  Filled 2022-11-21: qty 5

## 2022-11-21 MED ORDER — ASPIRIN 81 MG PO CHEW
81.0000 mg | CHEWABLE_TABLET | Freq: Two times a day (BID) | ORAL | Status: DC
  Administered 2022-11-21 – 2022-11-23 (×4): 81 mg via ORAL
  Filled 2022-11-21 (×4): qty 1

## 2022-11-21 MED ORDER — TAMSULOSIN HCL 0.4 MG PO CAPS
0.4000 mg | ORAL_CAPSULE | Freq: Two times a day (BID) | ORAL | Status: DC
  Administered 2022-11-21 – 2022-11-23 (×4): 0.4 mg via ORAL
  Filled 2022-11-21 (×4): qty 1

## 2022-11-21 MED ORDER — ONDANSETRON HCL 4 MG PO TABS
4.0000 mg | ORAL_TABLET | Freq: Four times a day (QID) | ORAL | Status: DC | PRN
  Administered 2022-11-22: 4 mg via ORAL
  Filled 2022-11-21: qty 1

## 2022-11-21 MED ORDER — HYDROMORPHONE HCL 1 MG/ML IJ SOLN
INTRAMUSCULAR | Status: AC
  Filled 2022-11-21: qty 1

## 2022-11-21 MED ORDER — EPHEDRINE SULFATE-NACL 50-0.9 MG/10ML-% IV SOSY
PREFILLED_SYRINGE | INTRAVENOUS | Status: DC | PRN
  Administered 2022-11-21 (×2): 5 mg via INTRAVENOUS

## 2022-11-21 MED ORDER — DOCUSATE SODIUM 100 MG PO CAPS
100.0000 mg | ORAL_CAPSULE | Freq: Two times a day (BID) | ORAL | Status: DC
  Administered 2022-11-21 – 2022-11-23 (×4): 100 mg via ORAL
  Filled 2022-11-21 (×4): qty 1

## 2022-11-21 MED ORDER — ONDANSETRON HCL 4 MG/2ML IJ SOLN
INTRAMUSCULAR | Status: AC
  Filled 2022-11-21: qty 2

## 2022-11-21 MED ORDER — PHENYLEPHRINE 80 MCG/ML (10ML) SYRINGE FOR IV PUSH (FOR BLOOD PRESSURE SUPPORT)
PREFILLED_SYRINGE | INTRAVENOUS | Status: AC
  Filled 2022-11-21: qty 10

## 2022-11-21 MED ORDER — DEXAMETHASONE SODIUM PHOSPHATE 10 MG/ML IJ SOLN
INTRAMUSCULAR | Status: AC
  Filled 2022-11-21: qty 1

## 2022-11-21 MED ORDER — PANTOPRAZOLE SODIUM 40 MG PO TBEC
40.0000 mg | DELAYED_RELEASE_TABLET | Freq: Every day | ORAL | Status: DC
  Administered 2022-11-21 – 2022-11-23 (×3): 40 mg via ORAL
  Filled 2022-11-21 (×3): qty 1

## 2022-11-21 MED ORDER — METOCLOPRAMIDE HCL 5 MG PO TABS: 5.0000 mg | ORAL_TABLET | Freq: Three times a day (TID) | ORAL | Status: DC | PRN

## 2022-11-21 MED ORDER — FENTANYL CITRATE (PF) 100 MCG/2ML IJ SOLN
INTRAMUSCULAR | Status: AC
  Administered 2022-11-21: 100 ug via INTRAVENOUS
  Filled 2022-11-21: qty 2

## 2022-11-21 MED ORDER — OXYCODONE HCL 5 MG PO TABS
10.0000 mg | ORAL_TABLET | ORAL | Status: DC | PRN
  Administered 2022-11-21: 15 mg via ORAL
  Administered 2022-11-21: 10 mg via ORAL
  Administered 2022-11-22 – 2022-11-23 (×6): 15 mg via ORAL
  Filled 2022-11-21 (×5): qty 3
  Filled 2022-11-21: qty 2
  Filled 2022-11-21 (×2): qty 3

## 2022-11-21 MED ORDER — VITAMIN D 25 MCG (1000 UNIT) PO TABS
1000.0000 [IU] | ORAL_TABLET | Freq: Every morning | ORAL | Status: DC
  Administered 2022-11-22 – 2022-11-23 (×2): 1000 [IU] via ORAL
  Filled 2022-11-21 (×2): qty 1

## 2022-11-21 MED ORDER — SODIUM CHLORIDE 0.9 % IR SOLN
Status: DC | PRN
  Administered 2022-11-21: 1000 mL

## 2022-11-21 MED ORDER — MIDAZOLAM HCL 2 MG/2ML IJ SOLN
INTRAMUSCULAR | Status: AC
  Filled 2022-11-21: qty 2

## 2022-11-21 MED ORDER — ONDANSETRON HCL 4 MG/2ML IJ SOLN: 4.0000 mg | Freq: Four times a day (QID) | INTRAMUSCULAR | Status: DC | PRN

## 2022-11-21 MED ORDER — HYDROMORPHONE HCL 1 MG/ML IJ SOLN
0.2500 mg | INTRAMUSCULAR | Status: DC | PRN
  Administered 2022-11-21 (×2): 0.5 mg via INTRAVENOUS

## 2022-11-21 MED ORDER — ORAL CARE MOUTH RINSE: 15.0000 mL | Freq: Once | OROMUCOSAL | Status: AC

## 2022-11-21 MED ORDER — SACUBITRIL-VALSARTAN 49-51 MG PO TABS
1.0000 | ORAL_TABLET | Freq: Two times a day (BID) | ORAL | Status: DC
  Administered 2022-11-21 – 2022-11-23 (×4): 1 via ORAL
  Filled 2022-11-21 (×6): qty 1

## 2022-11-21 MED ORDER — HYDROMORPHONE HCL 1 MG/ML IJ SOLN
0.5000 mg | INTRAMUSCULAR | Status: DC | PRN
  Administered 2022-11-21 – 2022-11-22 (×4): 1 mg via INTRAVENOUS
  Filled 2022-11-21 (×4): qty 1

## 2022-11-21 MED ORDER — TRANEXAMIC ACID-NACL 1000-0.7 MG/100ML-% IV SOLN
1000.0000 mg | INTRAVENOUS | Status: AC
  Administered 2022-11-21: 1000 mg via INTRAVENOUS
  Filled 2022-11-21: qty 100

## 2022-11-21 MED ORDER — CHLORHEXIDINE GLUCONATE 0.12 % MT SOLN
15.0000 mL | Freq: Once | OROMUCOSAL | Status: AC
  Administered 2022-11-21: 15 mL via OROMUCOSAL
  Filled 2022-11-21: qty 15

## 2022-11-21 MED ORDER — FUROSEMIDE 40 MG PO TABS
40.0000 mg | ORAL_TABLET | Freq: Every day | ORAL | Status: DC
  Administered 2022-11-21 – 2022-11-23 (×3): 40 mg via ORAL
  Filled 2022-11-21 (×3): qty 1

## 2022-11-21 MED ORDER — DIPHENHYDRAMINE HCL 12.5 MG/5ML PO ELIX: 12.5000 mg | ORAL_SOLUTION | ORAL | Status: DC | PRN

## 2022-11-21 MED ORDER — SPIRONOLACTONE 25 MG PO TABS
25.0000 mg | ORAL_TABLET | Freq: Every day | ORAL | Status: DC
  Administered 2022-11-21 – 2022-11-23 (×3): 25 mg via ORAL
  Filled 2022-11-21 (×3): qty 1

## 2022-11-21 MED ORDER — FENTANYL CITRATE (PF) 100 MCG/2ML IJ SOLN: 100.0000 ug | Freq: Once | INTRAMUSCULAR | Status: AC

## 2022-11-21 MED ORDER — HYDROMORPHONE HCL 1 MG/ML IJ SOLN
INTRAMUSCULAR | Status: DC | PRN
  Administered 2022-11-21 (×2): .5 mg via INTRAVENOUS

## 2022-11-21 MED ORDER — OXYCODONE HCL 5 MG PO TABS: 5.0000 mg | ORAL_TABLET | ORAL | Status: DC | PRN

## 2022-11-21 MED ORDER — PHENOL 1.4 % MT LIQD: 1.0000 | OROMUCOSAL | Status: DC | PRN

## 2022-11-21 MED ORDER — LUBIPROSTONE 8 MCG PO CAPS
8.0000 ug | ORAL_CAPSULE | Freq: Every day | ORAL | Status: DC
  Administered 2022-11-22 – 2022-11-23 (×2): 8 ug via ORAL
  Filled 2022-11-21 (×2): qty 1

## 2022-11-21 MED ORDER — EPHEDRINE 5 MG/ML INJ
INTRAVENOUS | Status: AC
  Filled 2022-11-21: qty 5

## 2022-11-21 MED ORDER — MIDAZOLAM HCL 2 MG/2ML IJ SOLN
INTRAMUSCULAR | Status: AC
  Administered 2022-11-21: 2 mg via INTRAVENOUS
  Filled 2022-11-21: qty 2

## 2022-11-21 MED ORDER — PROPOFOL 10 MG/ML IV BOLUS
INTRAVENOUS | Status: DC | PRN
  Administered 2022-11-21: 30 mg via INTRAVENOUS
  Administered 2022-11-21: 80 mg via INTRAVENOUS
  Administered 2022-11-21: 40 mg via INTRAVENOUS
  Administered 2022-11-21: 50 mg via INTRAVENOUS

## 2022-11-21 MED ORDER — BISOPROLOL FUMARATE 5 MG PO TABS
2.5000 mg | ORAL_TABLET | Freq: Every day | ORAL | Status: DC
  Administered 2022-11-22 – 2022-11-23 (×2): 2.5 mg via ORAL
  Filled 2022-11-21 (×2): qty 0.5

## 2022-11-21 MED ORDER — ROPIVACAINE HCL 5 MG/ML IJ SOLN
INTRAMUSCULAR | Status: DC | PRN
  Administered 2022-11-21: 30 mL via PERINEURAL

## 2022-11-21 MED ORDER — VITAMIN B-6 100 MG PO TABS
100.0000 mg | ORAL_TABLET | Freq: Every morning | ORAL | Status: DC
  Administered 2022-11-22 – 2022-11-23 (×2): 100 mg via ORAL
  Filled 2022-11-21 (×2): qty 1

## 2022-11-21 MED ORDER — POLYETHYLENE GLYCOL 3350 17 G PO PACK
17.0000 g | PACK | Freq: Every day | ORAL | Status: DC | PRN
  Administered 2022-11-22 – 2022-11-23 (×2): 17 g via ORAL
  Filled 2022-11-21 (×2): qty 1

## 2022-11-21 MED ORDER — MENTHOL 3 MG MT LOZG: 1.0000 | LOZENGE | OROMUCOSAL | Status: DC | PRN

## 2022-11-21 MED ORDER — MIDAZOLAM HCL 2 MG/2ML IJ SOLN
INTRAMUSCULAR | Status: DC | PRN
  Administered 2022-11-21: 2 mg via INTRAVENOUS

## 2022-11-21 MED ORDER — ROSUVASTATIN CALCIUM 20 MG PO TABS
20.0000 mg | ORAL_TABLET | Freq: Every day | ORAL | Status: DC
  Administered 2022-11-21 – 2022-11-22 (×2): 20 mg via ORAL
  Filled 2022-11-21 (×2): qty 1

## 2022-11-21 MED ORDER — ONDANSETRON HCL 4 MG/2ML IJ SOLN
INTRAMUSCULAR | Status: DC | PRN
  Administered 2022-11-21: 4 mg via INTRAVENOUS

## 2022-11-21 MED ORDER — ACETAMINOPHEN 10 MG/ML IV SOLN
INTRAVENOUS | Status: AC
  Filled 2022-11-21: qty 100

## 2022-11-21 MED ORDER — ROCURONIUM BROMIDE 100 MG/10ML IV SOLN
INTRAVENOUS | Status: DC | PRN
  Administered 2022-11-21: 80 mg via INTRAVENOUS

## 2022-11-21 MED ORDER — METHOCARBAMOL 1000 MG/10ML IJ SOLN: 500.0000 mg | Freq: Four times a day (QID) | INTRAVENOUS | Status: DC | PRN

## 2022-11-21 MED ORDER — CEFAZOLIN SODIUM-DEXTROSE 2-4 GM/100ML-% IV SOLN
2.0000 g | INTRAVENOUS | Status: AC
  Administered 2022-11-21: 2 g via INTRAVENOUS
  Filled 2022-11-21: qty 100

## 2022-11-21 MED ORDER — MIDAZOLAM HCL 2 MG/2ML IJ SOLN: 2.0000 mg | Freq: Once | INTRAMUSCULAR | Status: AC

## 2022-11-21 MED ORDER — VITAMIN C 500 MG PO TABS
250.0000 mg | ORAL_TABLET | Freq: Every day | ORAL | Status: DC
  Administered 2022-11-21 – 2022-11-23 (×3): 250 mg via ORAL
  Filled 2022-11-21 (×3): qty 1

## 2022-11-21 MED ORDER — ACETAMINOPHEN 10 MG/ML IV SOLN
INTRAVENOUS | Status: DC | PRN
  Administered 2022-11-21: 1000 mg via INTRAVENOUS

## 2022-11-21 MED ORDER — ACETAMINOPHEN 325 MG PO TABS
325.0000 mg | ORAL_TABLET | Freq: Four times a day (QID) | ORAL | Status: DC | PRN
  Administered 2022-11-22: 650 mg via ORAL
  Filled 2022-11-21: qty 2

## 2022-11-21 MED ORDER — KETAMINE HCL 10 MG/ML IJ SOLN
INTRAMUSCULAR | Status: DC | PRN
  Administered 2022-11-21: 20 mg via INTRAVENOUS

## 2022-11-21 MED ORDER — DEXAMETHASONE SODIUM PHOSPHATE 10 MG/ML IJ SOLN
INTRAMUSCULAR | Status: DC | PRN
  Administered 2022-11-21: 5 mg via INTRAVENOUS

## 2022-11-21 MED ORDER — GABAPENTIN 300 MG PO CAPS
300.0000 mg | ORAL_CAPSULE | Freq: Every day | ORAL | Status: DC
  Administered 2022-11-21 – 2022-11-22 (×2): 300 mg via ORAL
  Filled 2022-11-21 (×2): qty 1

## 2022-11-21 MED ORDER — EMPAGLIFLOZIN 10 MG PO TABS
10.0000 mg | ORAL_TABLET | Freq: Every day | ORAL | Status: DC
  Administered 2022-11-21 – 2022-11-23 (×3): 10 mg via ORAL
  Filled 2022-11-21 (×3): qty 1

## 2022-11-21 MED ORDER — CEFAZOLIN SODIUM-DEXTROSE 1-4 GM/50ML-% IV SOLN
1.0000 g | Freq: Four times a day (QID) | INTRAVENOUS | Status: AC
  Administered 2022-11-21 – 2022-11-22 (×2): 1 g via INTRAVENOUS
  Filled 2022-11-21 (×2): qty 50

## 2022-11-21 MED ORDER — LIDOCAINE 2% (20 MG/ML) 5 ML SYRINGE
INTRAMUSCULAR | Status: DC | PRN
  Administered 2022-11-21: 60 mg via INTRAVENOUS

## 2022-11-21 MED ORDER — 0.9 % SODIUM CHLORIDE (POUR BTL) OPTIME
TOPICAL | Status: DC | PRN
  Administered 2022-11-21: 1000 mL

## 2022-11-21 MED ORDER — SODIUM CHLORIDE 0.9 % IV SOLN: INTRAVENOUS | Status: DC

## 2022-11-21 MED ORDER — FENTANYL CITRATE (PF) 250 MCG/5ML IJ SOLN
INTRAMUSCULAR | Status: AC
  Filled 2022-11-21: qty 5

## 2022-11-21 MED ORDER — DULOXETINE HCL 20 MG PO CPEP
20.0000 mg | ORAL_CAPSULE | Freq: Every day | ORAL | Status: DC
  Administered 2022-11-21 – 2022-11-23 (×3): 20 mg via ORAL
  Filled 2022-11-21 (×3): qty 1

## 2022-11-21 MED ORDER — FENTANYL CITRATE (PF) 250 MCG/5ML IJ SOLN
INTRAMUSCULAR | Status: DC | PRN
  Administered 2022-11-21 (×5): 50 ug via INTRAVENOUS

## 2022-11-21 MED ORDER — SUGAMMADEX SODIUM 200 MG/2ML IV SOLN
INTRAVENOUS | Status: DC | PRN
  Administered 2022-11-21: 177 mg via INTRAVENOUS

## 2022-11-21 SURGICAL SUPPLY — 80 items
BAG COUNTER SPONGE SURGICOUNT (BAG) ×1 IMPLANT
BAG SPNG CNTER NS LX DISP (BAG) ×1
BANDAGE ESMARK 6X9 LF (GAUZE/BANDAGES/DRESSINGS) ×1 IMPLANT
BLADE SAG 18X100X1.27 (BLADE) ×1 IMPLANT
BNDG CMPR 5X6 CHSV STRCH STRL (GAUZE/BANDAGES/DRESSINGS) ×1
BNDG CMPR 9X6 STRL LF SNTH (GAUZE/BANDAGES/DRESSINGS) ×1
BNDG CMPR MED 15X6 ELC VLCR LF (GAUZE/BANDAGES/DRESSINGS) ×1
BNDG COHESIVE 6X5 TAN ST LF (GAUZE/BANDAGES/DRESSINGS) IMPLANT
BNDG ELASTIC 6X15 VLCR STRL LF (GAUZE/BANDAGES/DRESSINGS) IMPLANT
BNDG ELASTIC 6X5.8 VLCR STR LF (GAUZE/BANDAGES/DRESSINGS) ×2 IMPLANT
BNDG ESMARK 6X9 LF (GAUZE/BANDAGES/DRESSINGS) ×1
BOWL SMART MIX CTS (DISPOSABLE) IMPLANT
CEMENT BONE R 1X40 (Cement) IMPLANT
COMP TIB CMT PS KNEE F 0D RT (Joint) ×1 IMPLANT
COMPONENT TIB CMT PS KN F 0DRT (Joint) IMPLANT
COOLER ICEMAN CLASSIC (MISCELLANEOUS) ×1 IMPLANT
COVER SURGICAL LIGHT HANDLE (MISCELLANEOUS) ×1 IMPLANT
CUFF TOURN SGL QUICK 34 (TOURNIQUET CUFF) ×1
CUFF TOURN SGL QUICK 42 (TOURNIQUET CUFF) IMPLANT
CUFF TRNQT CYL 34X4.125X (TOURNIQUET CUFF) ×1 IMPLANT
DRAPE EXTREMITY T 121X128X90 (DISPOSABLE) ×1 IMPLANT
DRAPE HALF SHEET 40X57 (DRAPES) ×1 IMPLANT
DRAPE U-SHAPE 47X51 STRL (DRAPES) ×1 IMPLANT
DRSG XEROFORM 1X8 (GAUZE/BANDAGES/DRESSINGS) IMPLANT
DURAPREP 26ML APPLICATOR (WOUND CARE) ×1 IMPLANT
ELECT CAUTERY BLADE 6.4 (BLADE) ×1 IMPLANT
ELECT REM PT RETURN 9FT ADLT (ELECTROSURGICAL) ×1
ELECTRODE REM PT RTRN 9FT ADLT (ELECTROSURGICAL) ×1 IMPLANT
FACESHIELD WRAPAROUND (MASK) ×2 IMPLANT
FACESHIELD WRAPAROUND OR TEAM (MASK) ×2 IMPLANT
FEMUR CMT CR STD SZ 8 RT KNEE (Joint) ×1 IMPLANT
FEMUR CMTD CR STD SZ 8 RT KNEE (Joint) IMPLANT
GAUZE PAD ABD 8X10 STRL (GAUZE/BANDAGES/DRESSINGS) ×1 IMPLANT
GAUZE SPONGE 4X4 12PLY STRL (GAUZE/BANDAGES/DRESSINGS) ×1 IMPLANT
GAUZE XEROFORM 1X8 LF (GAUZE/BANDAGES/DRESSINGS) ×1 IMPLANT
GLOVE BIOGEL PI IND STRL 8 (GLOVE) ×2 IMPLANT
GLOVE ORTHO TXT STRL SZ7.5 (GLOVE) ×1 IMPLANT
GLOVE SURG ORTHO 8.0 STRL STRW (GLOVE) ×1 IMPLANT
GOWN STRL REUS W/ TWL LRG LVL3 (GOWN DISPOSABLE) IMPLANT
GOWN STRL REUS W/ TWL XL LVL3 (GOWN DISPOSABLE) ×2 IMPLANT
GOWN STRL REUS W/TWL LRG LVL3 (GOWN DISPOSABLE)
GOWN STRL REUS W/TWL XL LVL3 (GOWN DISPOSABLE) ×2
HANDPIECE INTERPULSE COAX TIP (DISPOSABLE) ×1
HDLS TROCR DRIL PIN KNEE 75 (PIN) ×4
IMMOBILIZER KNEE 22 UNIV (SOFTGOODS) ×1 IMPLANT
INSERT TIB ARTISURF SZ8-11X12 (Insert) IMPLANT
IV NS 1000ML (IV SOLUTION) ×1
IV NS 1000ML BAXH (IV SOLUTION) ×1 IMPLANT
KIT BASIN OR (CUSTOM PROCEDURE TRAY) ×1 IMPLANT
KIT TURNOVER KIT B (KITS) ×1 IMPLANT
MANIFOLD NEPTUNE II (INSTRUMENTS) ×1 IMPLANT
NDL 18GX1X1/2 (RX/OR ONLY) (NEEDLE) IMPLANT
NEEDLE 18GX1X1/2 (RX/OR ONLY) (NEEDLE) IMPLANT
NS IRRIG 1000ML POUR BTL (IV SOLUTION) ×1 IMPLANT
PACK TOTAL JOINT (CUSTOM PROCEDURE TRAY) ×1 IMPLANT
PAD ABD 8X10 STRL (GAUZE/BANDAGES/DRESSINGS) IMPLANT
PAD ARMBOARD 7.5X6 YLW CONV (MISCELLANEOUS) ×1 IMPLANT
PAD COLD SHLDR WRAP-ON (PAD) ×1 IMPLANT
PADDING CAST COTTON 6X4 STRL (CAST SUPPLIES) ×1 IMPLANT
PIN DRILL HDLS TROCAR 75 4PK (PIN) IMPLANT
SCREW FEMALE HEX FIX 25X2.5 (ORTHOPEDIC DISPOSABLE SUPPLIES) IMPLANT
SET HNDPC FAN SPRY TIP SCT (DISPOSABLE) ×1 IMPLANT
SET PAD KNEE POSITIONER (MISCELLANEOUS) ×1 IMPLANT
STAPLER VISISTAT 35W (STAPLE) ×1 IMPLANT
STEM POLY PAT PLY 35M KNEE (Knees) IMPLANT
SUCTION FRAZIER HANDLE 10FR (MISCELLANEOUS) ×1
SUCTION FRAZIER HANDLE 12FR (TUBING) ×1
SUCTION TUBE FRAZIER 10FR DISP (MISCELLANEOUS) ×1 IMPLANT
SUCTION TUBE FRAZIER 12FR DISP (TUBING) IMPLANT
SUT VIC AB 0 CT1 27 (SUTURE) ×1
SUT VIC AB 0 CT1 27XBRD ANBCTR (SUTURE) ×1 IMPLANT
SUT VIC AB 0 CT1 36 (SUTURE) IMPLANT
SUT VIC AB 1 CT1 27 (SUTURE) ×2
SUT VIC AB 1 CT1 27XBRD ANBCTR (SUTURE) ×2 IMPLANT
SUT VIC AB 2-0 CT1 27 (SUTURE) ×2
SUT VIC AB 2-0 CT1 TAPERPNT 27 (SUTURE) ×2 IMPLANT
SYR 50ML LL SCALE MARK (SYRINGE) IMPLANT
TOWEL GREEN STERILE (TOWEL DISPOSABLE) ×1 IMPLANT
TOWEL GREEN STERILE FF (TOWEL DISPOSABLE) ×1 IMPLANT
TRAY CATH INTERMITTENT SS 16FR (CATHETERS) IMPLANT

## 2022-11-21 NOTE — Op Note (Signed)
Operative Note  Date of operation: 11/21/2022 Preoperative diagnosis: Right knee primary osteoarthritis Postoperative diagnosis: Same  Procedure: Right cemented total knee arthroplasty  Implants: Biomet/Zimmer cemented persona knee system Implant Name Type Inv. Item Serial No. Manufacturer Lot No. LRB No. Used Action  CEMENT BONE R 1X40 - [id number] Cement CEMENT BONE R 1X40  ZIMMER RECON(ORTH,TRAU,BIO,SG) [id number] Right 2 Implanted  INSERT TIB ARTISURF [id number] - [id number] Insert INSERT TIB ARTISURF [id number]  ZIMMER RECON(ORTH,TRAU,BIO,SG) [id number] Right 1 Implanted  STEM POLY PAT PLY 49M KNEE - [id number] Knees STEM POLY PAT PLY 49M KNEE  ZIMMER RECON(ORTH,TRAU,BIO,SG) [id number] Right 1 Implanted  COMP TIB CMT PS KNEE F 0D RT - [id number] Joint COMP TIB CMT PS KNEE F 0D RT  ZIMMER RECON(ORTH,TRAU,BIO,SG) [id number] Right 1 Implanted  FEMUR CMT CR STD SZ 8 RT KNEE - [id number] Joint FEMUR CMT CR STD SZ 8 RT KNEE  ZIMMER RECON(ORTH,TRAU,BIO,SG) [id number] Right 1 Implanted   Surgeon: Vanita Panda. Magnus Ivan, MD Assistant: Rexene Edison, PA-C  Anesthesia: #1 right lower extremity adductor canal block, #2 General Tourniquet time: Under 1 hour EBL: Less than 100 cc Antibiotics: 2 g IV Ancef Complications: None  Indications: The patient is a 77 year old gentleman with well-documented end-stage arthritis involving his right knee.  His x-rays show varus malalignment with bone-on-bone wear that knee.  His pain is daily and it is detrimentally affecting his mobility, his quality of life and his activities of daily living.  He has tried and failed all forms conservative treatment for many years now.  At this point he does wish to proceed with a right knee replacement.  He remotely had his left knee replaced and that has done well.  He is fully aware of of the risk of acute blood loss anemia, nerve or vessel injury, fracture, infection, DVT, implant failure and wound healing issues.  He understands her  goals are hopefully decrease pain, improve mobility and improve quality of life.  Procedure description: After informed consent was obtained and the appropriate right knee was marked, anesthesia obtained a right lower extremity adductor canal block in the holding room.  The patient was then brought to the operating room where general anesthesia was obtained while he was on the OR table.  A nonsterile tourniquet is placed around his upper right thigh and his right thigh, knee, leg, ankle and foot were prepped draped in DuraPrep and sterile drapes Kuhner sterile stockinette.  A timeout was called he was identified as correct patient correct right knee.  An Esmarch was then used to wrap out the leg and the tourniquet was inflated to 300 mm of pressure.  With the knee extended a direct midline incision was made over the patella and carried this proximally distally.  Dissection was carried down the knee joint and a medial parapatellar arthrotomy was made.  Moderate joint effusion was encountered.  With the knee in a flexed position we found complete denuding of the cartilage of the medial compartment the knee and significant wear on the other compartments as well.  We removed osteophytes from all 3 compartments as well as remnants of the ACL and medial lateral meniscus.  We then used a intramedullary cutting guide making her proximal tibia cut.  We made this cut to take 2 mm off the low side correction varus and valgus and a 7 degree slope and we did backed this down to more millimeters.  We then used a intramedullary cutting guide for distal femur cut setting this for right knee at 5  degrees externally rotated for 10 mm distal femoral cut we made that cut without difficulty and brought the knee back down to full extension and achieve full extension with a 10 mm extension block.  We go back to the femur and chose our femur size based off the epicondylar axis and a femoral sizing guide choosing a size 8 femur.  We put a  4-in-1 cutting block for size 8 femur and made her anterior posterior cuts followed by her chamfer cuts.  We then backed the tibia and chose a size F tibial tray for right knee for coverage over the tibial plateau setting the rotation of the tibial tubercle and the femur.  We made our keel punch and drill hole off of this.  We then trialed our size F right tibia with our size 8 right CR standard femur.  We trialed our 10 mm medial congruent right fixed-bearing polythene insert and went up to 12 mm insert and we are pleased with range of motion and stability without insert.  We then made a patella cut and drilled 3 holes for a size 35 patella button.  With all transportation the knee we put him through several cycles of motion and we are pleased with range of motion and stability.  We then removed all transportation from the knee and irrigate the knee with normal saline solution.  We mixed our cement and then with the knee in a flexed position cemented our Biomet Zimmer persona tibial tray for right knee size F followed by cementing our size 8 right CR standard femur.  We placed our 12 mm right medial congruent fixed-bearing polythene insert and cemented our size 35 patella button.  Excess cement debris was removed from the knee.  We then held the knee in an extended position and compressed while the cement hardened.  Once it hardened with the tourniquet down and hemostasis was obtained electrocautery.  The arthrotomy was closed with interrupted #1 Vicryl suture followed by 0 Vicryl close deep tissue and 2-0 Vicryl because subcutaneous tissue.  The skin was closed with staples.  Well-padded sterile dressing was applied.  The patient was awakened, extubated and taken recovery in stable condition Rexene Edison, PA-C did assist during the entire case and beginning to end and his assistance was crucial medically necessary for soft tissue management and retraction, helping guide implant placement and a layered closure of the  wound.

## 2022-11-21 NOTE — Anesthesia Procedure Notes (Signed)
Procedure Name: Intubation Date/Time: 11/21/2022 3:04 PM  Performed by: Brynda Peon, CRNAPre-anesthesia Checklist: Patient identified, Emergency Drugs available, Suction available, Patient being monitored and Timeout performed Patient Re-evaluated:Patient Re-evaluated prior to induction Oxygen Delivery Method: Circle system utilized Preoxygenation: Pre-oxygenation with 100% oxygen Induction Type: IV induction Ventilation: Mask ventilation without difficulty Laryngoscope Size: Miller and 3 Grade View: Grade I Tube type: Oral Tube size: 7.5 mm Number of attempts: 1 Airway Equipment and Method: Stylet Placement Confirmation: ETT inserted through vocal cords under direct vision, positive ETCO2 and breath sounds checked- equal and bilateral Secured at: 23 cm Tube secured with: Tape Dental Injury: Teeth and Oropharynx as per pre-operative assessment

## 2022-11-21 NOTE — Interval H&P Note (Signed)
History and Physical Interval Note: The patient understands that he is here today for a right total knee replacement to treat his severe right knee arthritis.  There has been no acute or interval change in his medical status.  See H&P.  The risks and benefits of surgery been discussed in detail and informed consent has been obtained.  The right operative knee has been marked.  11/21/2022 2:02 PM  [name]  has presented today for surgery, with the diagnosis of endstage osteoarthritis right knee.  The various methods of treatment have been discussed with the patient and family. After consideration of risks, benefits and other options for treatment, the patient has consented to  Procedure(s): RIGHT TOTAL KNEE ARTHROPLASTY (Right) as a surgical intervention.  The patient's history has been reviewed, patient examined, no change in status, stable for surgery.  I have reviewed the patient's chart and labs.  Questions were answered to the patient's satisfaction.     Kathryne Hitch

## 2022-11-21 NOTE — Anesthesia Procedure Notes (Signed)
Anesthesia Regional Block: Adductor canal block   Pre-Anesthetic Checklist: , timeout performed,  Correct Patient, Correct Site, Correct Laterality,  Correct Procedure, Correct Position, site marked,  Risks and benefits discussed,  Surgical consent,  Pre-op evaluation,  At surgeon's request and post-op pain management  Laterality: Right  Prep: Maximum Sterile Barrier Precautions used, chloraprep       Needles:  Injection technique: Single-shot  Needle Type: Echogenic Stimulator Needle     Needle Length: 9cm  Needle Gauge: 22     Additional Needles:   Procedures:,,,, ultrasound used (permanent image in chart),,    Narrative:  Start time: 11/21/2022 12:40 PM End time: 11/21/2022 12:45 PM Injection made incrementally with aspirations every 5 mL.  Performed by: Personally  Anesthesiologist: Lannie Fields, DO  Additional Notes: Monitors applied. No increased pain on injection. No increased resistance to injection. Injection made in 5cc increments. Good needle visualization. Patient tolerated procedure well.

## 2022-11-21 NOTE — Transfer of Care (Signed)
Immediate Anesthesia Transfer of Care Note  Patient: RONAK TOTA  Procedure(s) Performed: RIGHT TOTAL KNEE ARTHROPLASTY (Right: Knee)  Patient Location: PACU  Anesthesia Type:General  Level of Consciousness: awake, alert , and oriented  Airway & Oxygen Therapy: Patient Spontanous Breathing and Patient connected to face mask oxygen  Post-op Assessment: Report given to RN, Post -op Vital signs reviewed and stable, Patient moving all extremities X 4, and Patient able to stick tongue midline  Post vital signs: Reviewed  Last Vitals:  Vitals Value Taken Time  BP 164/87   Temp 98.4   Pulse 82   Resp 12   SpO2 95     Last Pain:  Vitals:   11/21/22 1211  PainSc: 3       Patients Stated Pain Goal: 3 (11/21/22 1211)  Complications: No notable events documented.

## 2022-11-21 NOTE — Anesthesia Postprocedure Evaluation (Signed)
Anesthesia Post Note  Patient: Brian Gallegos  Procedure(s) Performed: RIGHT TOTAL KNEE ARTHROPLASTY (Right: Knee)     Patient location during evaluation: PACU Anesthesia Type: General and Regional Level of consciousness: awake and alert Pain management: pain level controlled Vital Signs Assessment: post-procedure vital signs reviewed and stable Respiratory status: spontaneous breathing, nonlabored ventilation and respiratory function stable Cardiovascular status: blood pressure returned to baseline and stable Postop Assessment: no apparent nausea or vomiting Anesthetic complications: no  No notable events documented.  Last Vitals:  Vitals:   11/21/22 1730 11/21/22 1745  BP: (!) 147/70 (!) 144/71  Pulse: 83 73  Resp: 15 (!) 9  Temp:  36.7 C  SpO2: 93% 94%    Last Pain:  Vitals:   11/21/22 1745  PainSc: 0-No pain        RLE Motor Response: Purposeful movement (11/21/22 1745) RLE Sensation: Full sensation (11/21/22 1745)      Makaylin Carlo,W. EDMOND

## 2022-11-22 ENCOUNTER — Encounter (HOSPITAL_COMMUNITY): Payer: Self-pay | Admitting: Orthopaedic Surgery

## 2022-11-22 DIAGNOSIS — M25761 Osteophyte, right knee: Secondary | ICD-10-CM | POA: Diagnosis present

## 2022-11-22 DIAGNOSIS — Z7984 Long term (current) use of oral hypoglycemic drugs: Secondary | ICD-10-CM | POA: Diagnosis not present

## 2022-11-22 DIAGNOSIS — I5042 Chronic combined systolic (congestive) and diastolic (congestive) heart failure: Secondary | ICD-10-CM | POA: Diagnosis present

## 2022-11-22 DIAGNOSIS — H269 Unspecified cataract: Secondary | ICD-10-CM | POA: Diagnosis present

## 2022-11-22 DIAGNOSIS — Z981 Arthrodesis status: Secondary | ICD-10-CM | POA: Diagnosis not present

## 2022-11-22 DIAGNOSIS — Z96652 Presence of left artificial knee joint: Secondary | ICD-10-CM | POA: Diagnosis present

## 2022-11-22 DIAGNOSIS — I2510 Atherosclerotic heart disease of native coronary artery without angina pectoris: Secondary | ICD-10-CM | POA: Diagnosis present

## 2022-11-22 DIAGNOSIS — G2581 Restless legs syndrome: Secondary | ICD-10-CM | POA: Diagnosis present

## 2022-11-22 DIAGNOSIS — Z888 Allergy status to other drugs, medicaments and biological substances status: Secondary | ICD-10-CM | POA: Diagnosis not present

## 2022-11-22 DIAGNOSIS — E7800 Pure hypercholesterolemia, unspecified: Secondary | ICD-10-CM | POA: Diagnosis present

## 2022-11-22 DIAGNOSIS — Z8249 Family history of ischemic heart disease and other diseases of the circulatory system: Secondary | ICD-10-CM | POA: Diagnosis not present

## 2022-11-22 DIAGNOSIS — E119 Type 2 diabetes mellitus without complications: Secondary | ICD-10-CM | POA: Diagnosis present

## 2022-11-22 DIAGNOSIS — E785 Hyperlipidemia, unspecified: Secondary | ICD-10-CM | POA: Diagnosis present

## 2022-11-22 DIAGNOSIS — M1711 Unilateral primary osteoarthritis, right knee: Secondary | ICD-10-CM | POA: Diagnosis present

## 2022-11-22 DIAGNOSIS — Z79899 Other long term (current) drug therapy: Secondary | ICD-10-CM | POA: Diagnosis not present

## 2022-11-22 DIAGNOSIS — N401 Enlarged prostate with lower urinary tract symptoms: Secondary | ICD-10-CM | POA: Diagnosis present

## 2022-11-22 DIAGNOSIS — I110 Hypertensive heart disease with heart failure: Secondary | ICD-10-CM | POA: Diagnosis present

## 2022-11-22 DIAGNOSIS — Z85828 Personal history of other malignant neoplasm of skin: Secondary | ICD-10-CM | POA: Diagnosis not present

## 2022-11-22 DIAGNOSIS — Z9682 Presence of neurostimulator: Secondary | ICD-10-CM | POA: Diagnosis not present

## 2022-11-22 DIAGNOSIS — Z885 Allergy status to narcotic agent status: Secondary | ICD-10-CM | POA: Diagnosis not present

## 2022-11-22 DIAGNOSIS — Z955 Presence of coronary angioplasty implant and graft: Secondary | ICD-10-CM | POA: Diagnosis not present

## 2022-11-22 LAB — BASIC METABOLIC PANEL
Anion gap: 8 (ref 5–15)
BUN: 16 mg/dL (ref 8–23)
CO2: 25 mmol/L (ref 22–32)
Calcium: 8.8 mg/dL — ABNORMAL LOW (ref 8.9–10.3)
Chloride: 102 mmol/L (ref 98–111)
Creatinine, Ser: 1.05 mg/dL (ref 0.61–1.24)
GFR, Estimated: >60 mL/min (ref >60)
Glucose, Bld: 139 mg/dL — ABNORMAL HIGH (ref 70–99)
Potassium: 4.3 mmol/L (ref 3.5–5.1)
Sodium: 135 mmol/L (ref 135–145)

## 2022-11-22 LAB — CBC
HCT: 39.8 % (ref 39.0–52.0)
Hemoglobin: 12.9 g/dL — ABNORMAL LOW (ref 13.0–17.0)
MCH: 31.4 pg (ref 26.0–34.0)
MCHC: 32.4 g/dL (ref 30.0–36.0)
MCV: 96.8 fL (ref 80.0–100.0)
Platelets: 188 10*3/uL (ref 150–400)
RBC: 4.11 MIL/uL — ABNORMAL LOW (ref 4.22–5.81)
RDW: 13 % (ref 11.5–15.5)
WBC: 12.5 10*3/uL — ABNORMAL HIGH (ref 4.0–10.5)
nRBC: 0 % (ref 0.0–0.2)

## 2022-11-22 MED ORDER — ASPIRIN 81 MG PO CHEW: 81.0000 mg | CHEWABLE_TABLET | Freq: Two times a day (BID) | ORAL | 0 refills | Status: DC

## 2022-11-22 MED ORDER — OXYCODONE HCL 5 MG PO TABS: 5.0000 mg | ORAL_TABLET | ORAL | 0 refills | Status: DC | PRN

## 2022-11-22 MED ORDER — METHOCARBAMOL 500 MG PO TABS: 500.0000 mg | ORAL_TABLET | Freq: Four times a day (QID) | ORAL | 0 refills | Status: DC | PRN

## 2022-11-22 MED ORDER — PHENYLEPHRINE HCL-NACL 20-0.9 MG/250ML-% IV SOLN
INTRAVENOUS | Status: AC
  Filled 2022-11-22: qty 500

## 2022-11-22 MED ORDER — TRAZODONE HCL 50 MG PO TABS
100.0000 mg | ORAL_TABLET | Freq: Every evening | ORAL | Status: DC | PRN
  Administered 2022-11-22: 100 mg via ORAL
  Filled 2022-11-22: qty 2

## 2022-11-22 NOTE — TOC Initial Note (Signed)
Transition of Care [facility]) - Initial/Assessment Note    Patient Details  Name: Brian Gallegos MRN: [id number] Date of Birth: 02-08-1946  Transition of Care [facility]) CM/SW Contact:    Brian Sabal, RN Phone Number: 11/22/2022, 10:42 AM  Clinical Narrative:                  Brian Gallegos w patient at bedside.  He states that he lives at home, in a handicapped accessible apartment that was built into his son's house.  He has all needed DME, RW and handicapped accessible bathroom.  Son can assist after DC.  [facility] was preassigned by office and has already been in contact with the patient. I notified the hospital liaison that he may go home today pending his PT eval.  No other TOC needs identified at this time.   Expected Discharge Plan: Home w Home Health Services Barriers to Discharge: Continued Medical Work up   Patient Goals and CMS Choice Patient states their goals for this hospitalization and ongoing recovery are:: to go home CMS Medicare.gov Compare Post Acute Care list provided to:: Patient Choice offered to / list presented to : Patient      Expected Discharge Plan and Services   Discharge Planning Services: CM Consult Post Acute Care Choice: Durable Medical Equipment, Home Health Living arrangements for the past 2 months: Single Family Home, Apartment                 DME Arranged: N/A         HH Arranged: PT HH Agency: [facility] Date HH Agency Contacted: 11/22/22 Time HH Agency Contacted: 1042 Representative spoke with at [facility] Agency: Brian Gallegos  Prior Living Arrangements/Services Living arrangements for the past 2 months: Single Family Home, Apartment Lives with:: Adult Children   Do you feel safe going back to the place where you live?: Yes          Current home services: DME    Activities of Daily Living      Permission Sought/Granted                  Emotional Assessment              Admission diagnosis:  Status post total right  knee replacement [Z96.651] Patient Active Problem List   Diagnosis Date Noted   Status post total right knee replacement 11/21/2022   Chronic combined systolic and diastolic heart failure (HCC) 02/08/2022   Coronary artery disease    Unilateral primary osteoarthritis, right knee 08/30/2021   Erectile dysfunction due to arterial insufficiency 07/30/2020   Benign prostatic hyperplasia with urinary obstruction 06/14/2020   Urinary retention 06/14/2020   Post PTCA 10/12/2015   Abnormal cardiovascular stress test 10/11/2015   Atypical chest pain 10/11/2015   PCP:  Brian Hazel, MD Pharmacy:   [facility] [phone] - [place], [place] - [address] AT [facility] [address] [place] [address] Phone: [phone] Fax: [phone]  [email or web address] - [place], [place] - [address] [place] [address] Phone: [phone] Fax: [phone]  [facility] [address] [place] - [address] [place] [address] Phone: [phone] Fax: [phone]  [facility] #[id number] [place], [place] - [address] AT [place] [address] & [address] [place] [address] Phone: [phone] Fax: [phone]     Social Determinants of  Health (SDOH) Social History: SDOH Screenings   Tobacco Use: Low Risk  (11/21/2022)   SDOH Interventions:     Readmission Risk Interventions     No data to display

## 2022-11-22 NOTE — Progress Notes (Signed)
Subjective: 1 Day Post-Op Procedure(s) (LRB): RIGHT TOTAL KNEE ARTHROPLASTY (Right) Patient reports pain as moderate.    Objective: Vital signs in last 24 hours: Temp:  [97.2 F (36.2 C)-98.8 F (37.1 C)] 98.3 F (36.8 C) (05/22 0718) Pulse Rate:  [55-105] 95 (05/22 0718) Resp:  [9-18] 18 (05/22 0718) BP: (132-157)/(70-91) 149/81 (05/22 0718) SpO2:  [91 %-97 %] 93 % (05/22 0718) Weight:  [88.5 kg] 88.5 kg (05/21 1158)  Intake/Output from previous day: 05/21 0701 - 05/22 0700 In: 1600 [I.V.:1300; IV Piggyback:300] Out: 1555 [Urine:1550; Blood:5] Intake/Output this shift: No intake/output data recorded.  Recent Labs    11/22/22 0337  HGB 12.9*   Recent Labs    11/22/22 0337  WBC 12.5*  RBC 4.11*  HCT 39.8  PLT 188   Recent Labs    11/22/22 0337  NA 135  K 4.3  CL 102  CO2 25  BUN 16  CREATININE 1.05  GLUCOSE 139*  CALCIUM 8.8*   No results for input(s): "LABPT", "INR" in the last 72 hours.  Sensation intact distally Intact pulses distally Dorsiflexion/Plantar flexion intact Incision: dressing C/D/I No cellulitis present Compartment soft   Assessment/Plan: 1 Day Post-Op Procedure(s) (LRB): RIGHT TOTAL KNEE ARTHROPLASTY (Right) Up with therapy Discharge home with home health if clears therapy and doing well.      Kathryne Hitch 11/22/2022, 7:31 AM

## 2022-11-22 NOTE — Evaluation (Addendum)
Physical Therapy Evaluation Patient Details Name: Brian Gallegos MRN: [id number] DOB: October 22, 1945 Today's Date: 11/22/2022  History of Present Illness  Admitted for RTKA, WBAT;  has a past medical history of Arthritis, Cancer (HCC), CHF (congestive heart failure) (HCC), Chronic lower back pain, Coronary artery disease, Early cataracts, bilateral, Family history of adverse reaction to anesthesia, Hypercholesterolemia, Hypertension, Migraine, Restless leg syndrome, and Wears glasses.  Clinical Impression   Pt admitted with above diagnosis. Lives at home alone, in a single-level home with 1 steps to enter; Basement apartment in son's house; Prior to admission, pt was able to manage independently; Presents to PT with R knee pain effecting functional mobility and activity tolerance ; Overall min assist for bed mobility , transfers and household amb; Pt is wondering about dc home today, and this is quite reasonable; will plan to return fo rstair training later today;  Pt currently with functional limitations due to the deficits listed below (see PT Problem List). Pt will benefit from skilled PT to increase their independence and safety with mobility to allow discharge to the venue listed below.          Recommendations for follow up therapy are one component of a multi-disciplinary discharge planning process, led by the attending physician.  Recommendations may be updated based on patient status, additional functional criteria and insurance authorization.  Follow Up Recommendations       Assistance Recommended at Discharge PRN  Patient can return home with the following  Assistance with cooking/housework;Help with stairs or ramp for entrance    Equipment Recommendations None recommended by PT (well-equipped)  Recommendations for Other Services       Functional Status Assessment Patient has had a recent decline in their functional status and demonstrates the ability to make significant  improvements in function in a reasonable and predictable amount of time.     Precautions / Restrictions Precautions Precautions: Knee Precaution Booklet Issued: Yes (comment) Precaution Comments: Pt educated to not allow any pillow or bolster under knee for healing with optimal range of motion.  Restrictions RLE Weight Bearing: Weight bearing as tolerated      Mobility  Bed Mobility Overal bed mobility: Needs Assistance Bed Mobility: Supine to Sit     Supine to sit: Min assist     General bed mobility comments: Min assist to support RLE coming off of the bed    Transfers Overall transfer level: Needs assistance Equipment used: Rolling walker (2 wheels) Transfers: Sit to/from Stand Sit to Stand: Min assist           General transfer comment: Min assist to steady RW    Ambulation/Gait Ambulation/Gait assistance: Min guard (with and without physical contact) Gait Distance (Feet): 45 Feet Assistive device: Rolling walker (2 wheels) Gait Pattern/deviations: Step-through pattern (emerging) Gait velocity: slowed     General Gait Details: Cues to stand tall on RLE in stance, and to activate R quad for stance stability  Stairs            Wheelchair Mobility    Modified Rankin (Stroke Patients Only)       Balance                                             Pertinent Vitals/Pain Pain Assessment Pain Assessment: 0-10 Pain Score: 9  Pain Location: R knee Pain Descriptors / Indicators: Burning, Aching Pain  Intervention(s): Monitored during session, Repositioned    Home Living Family/patient expects to be discharged to:: Private residence Living Arrangements: Children Available Help at Discharge: Family Type of Home: Apartment Home Access: Stairs to enter Entrance Stairs-Rails: None Entrance Stairs-Number of Steps: 1   Home Layout: One level Home Equipment: Agricultural consultant (2 wheels);Rollator (4 wheels);BSC/3in1;Shower seat - built  in      Prior Function Prior Level of Function : Independent/Modified Independent                     Hand Dominance        Extremity/Trunk Assessment   Upper Extremity Assessment Upper Extremity Assessment: Overall WFL for tasks assessed    Lower Extremity Assessment Lower Extremity Assessment: RLE deficits/detail RLE Deficits / Details: Grossly decr AROM and strength, limited by pain post TKA; Able to perform straight leg raise with very minimal assist; Flex to approx 81degrees    Cervical / Trunk Assessment Cervical / Trunk Assessment: Normal  Communication   Communication: No difficulties  Cognition Arousal/Alertness: Awake/alert Behavior During Therapy: WFL for tasks assessed/performed Overall Cognitive Status: Within Functional Limits for tasks assessed                                          General Comments General comments (skin integrity, edema, etc.): No syncopal symptoms with standing and walking    Exercises Total Joint Exercises Ankle Circles/Pumps: AROM, Both, 10 reps Quad Sets: AROM, Right, 10 reps Knee Flexion: AROM, AAROM, Left, 5 reps, Seated (with LLE applying overpressure into incr flexion for AAROM)   Assessment/Plan    PT Assessment Patient needs continued PT services  PT Problem List Decreased strength;Decreased range of motion;Decreased activity tolerance;Decreased balance;Decreased mobility;Decreased knowledge of use of DME;Decreased knowledge of precautions;Pain       PT Treatment Interventions DME instruction;Gait training;Stair training;Functional mobility training;Therapeutic activities;Therapeutic exercise;Balance training;Patient/family education    PT Goals (Current goals can be found in the Care Plan section)  Acute Rehab PT Goals Patient Stated Goal: back to golf PT Goal Formulation: With patient Time For Goal Achievement: 11/29/22 Potential to Achieve Goals: Good    Frequency 7X/week      Co-evaluation               AM-PAC PT "6 Clicks" Mobility  Outcome Measure Help needed turning from your back to your side while in a flat bed without using bedrails?: None Help needed moving from lying on your back to sitting on the side of a flat bed without using bedrails?: A Little Help needed moving to and from a bed to a chair (including a wheelchair)?: A Little Help needed standing up from a chair using your arms (e.g., wheelchair or bedside chair)?: A Little Help needed to walk in hospital room?: A Little Help needed climbing 3-5 steps with a railing? : A Little 6 Click Score: 19    End of Session Equipment Utilized During Treatment: Gait belt Activity Tolerance: Patient tolerated treatment well Patient left: in chair;with call bell/phone within reach Nurse Communication: Mobility status PT Visit Diagnosis: Other abnormalities of gait and mobility (R26.89);Pain Pain - Right/Left: Right Pain - part of body: Knee    Time: 0919-1004 PT Time Calculation (min) (ACUTE ONLY): 45 min   Charges:   PT Evaluation $PT Eval Low Complexity: 1 Low PT Treatments $Gait Training: 8-22 mins $Therapeutic Activity: 8-22 mins  Van Clines, PT  Acute Rehabilitation Services Office [phone] Secure Chat welcomed   Brian Gallegos 11/22/2022, 12:29 PM

## 2022-11-22 NOTE — Care Management Obs Status (Signed)
MEDICARE OBSERVATION STATUS NOTIFICATION   Patient Details  Name: Brian Gallegos MRN: [id number] Date of Birth: Mar 03, 1946   Medicare Observation Status Notification Given:  Yes    Lawerance Sabal, RN 11/22/2022, 7:57 AM

## 2022-11-22 NOTE — Discharge Instructions (Signed)

## 2022-11-22 NOTE — Progress Notes (Addendum)
PT reports that pt is not physically ready for discharge home today, pt's primary nurse aware.    Annice Needy, RN SWOT

## 2022-11-22 NOTE — Progress Notes (Signed)
Physical Therapy Treatment Patient Details Name: Brian Gallegos MRN: [id number] DOB: [birth year] Today's Date: 11/22/2022   History of Present Illness Admitted for RTKA, WBAT;  has a past medical history of Arthritis, Cancer (HCC), CHF (congestive heart failure) (HCC), Chronic lower back pain, Coronary artery disease, Early cataracts, bilateral, Family history of adverse reaction to anesthesia, Hypercholesterolemia, Hypertension, Migraine, Restless leg syndrome, and Wears glasses.    PT Comments    Continuing work on functional mobility and activity tolerance;  Spent a lot of time discussing pt status and pain medication considerations with pt, his daughter, and Toni Amend, Charity fundraiser; Ultimately we opted to have pt say another night inpatient for more practice with progressive amb and to get pain control regimen dialed in; Even in this considerable amount of pain, pt was able to get to and from the bathroom x2, and practice going up and down a step; on track for dc tomorrow   Recommendations for follow up therapy are one component of a multi-disciplinary discharge planning process, led by the attending physician.  Recommendations may be updated based on patient status, additional functional criteria and insurance authorization.  Follow Up Recommendations       Assistance Recommended at Discharge PRN  Patient can return home with the following Assistance with cooking/housework;Help with stairs or ramp for entrance   Equipment Recommendations  None recommended by PT (well-equipped)    Recommendations for Other Services       Precautions / Restrictions Precautions Precautions: Knee Precaution Booklet Issued: Yes (comment) Precaution Comments: Pt educated to not allow any pillow or bolster under knee for healing with optimal range of motion.   Restrictions RLE Weight Bearing: Weight bearing as tolerated     Mobility  Bed Mobility Overal bed mobility: Needs Assistance Bed Mobility: Supine  to Sit     Supine to sit: Min guard (without physical contact)     General bed mobility comments: Used gait belt looped around foot to move his RLE off of teh bed and support it coming to the floor; used LLE under RLE to help RLE back into bed    Transfers Overall transfer level: Needs assistance Equipment used: Rolling walker (2 wheels) Transfers: Sit to/from Stand Sit to Stand: Min guard           General transfer comment: Minguard for safety; good hand placement    Ambulation/Gait Ambulation/Gait assistance: Min guard (with and without physical contact) Gait Distance (Feet): 22 Feet Assistive device: Rolling walker (2 wheels) Gait Pattern/deviations: Step-to pattern Gait velocity: slowed     General Gait Details: Very painful R knee this afternoon, and pt was still able to get up and walk in room and to bathroom, though not putting as much weight onto R knee in stance   Stairs Stairs: Yes Stairs assistance: Min guard Stair Management: No rails, Backwards, With walker Number of Stairs: 1 General stair comments: Education re: sequencing for one step backwards with demonstration, then pt performed one step backwards well   Wheelchair Mobility    Modified Rankin (Stroke Patients Only)       Balance                                            Cognition Arousal/Alertness: Awake/alert Behavior During Therapy: WFL for tasks assessed/performed Overall Cognitive Status: Within Functional Limits for tasks assessed  Exercises Total Joint Exercises Ankle Circles/Pumps: AROM, Both, 10 reps Quad Sets: AROM, Right, 10 reps Knee Flexion: AROM, AAROM, Left, 5 reps, Seated (with LLE applying overpressure into incr flexion for AAROM)    General Comments General comments (skin integrity, edema, etc.): Pt very painful this afternoon, and reports feeling hot and general malaise; temp 100 deg F;  Porvided pt with incentive spirometer and pt demonstrated use well      Pertinent Vitals/Pain Pain Assessment Pain Assessment: 0-10 Pain Score: 9  Pain Location: R knee Pain Descriptors / Indicators: Burning, Aching Pain Intervention(s): Monitored during session, RN gave pain meds during session    Home Living Family/patient expects to be discharged to:: Private residence Living Arrangements: Children Available Help at Discharge: Family Type of Home: Apartment Home Access: Stairs to enter Entrance Stairs-Rails: None Entrance Stairs-Number of Steps: 1   Home Layout: One level Home Equipment: Agricultural consultant (2 wheels);Rollator (4 wheels);BSC/3in1;Shower seat - built in      Prior Function            PT Goals (current goals can now be found in the care plan section) Acute Rehab PT Goals Patient Stated Goal: back to golf PT Goal Formulation: With patient Time For Goal Achievement: 11/29/22 Potential to Achieve Goals: Good Progress towards PT goals: Progressing toward goals (slower this afternoon)    Frequency    7X/week      PT Plan Current plan remains appropriate    Co-evaluation              AM-PAC PT "6 Clicks" Mobility   Outcome Measure  Help needed turning from your back to your side while in a flat bed without using bedrails?: None Help needed moving from lying on your back to sitting on the side of a flat bed without using bedrails?: None Help needed moving to and from a bed to a chair (including a wheelchair)?: A Little Help needed standing up from a chair using your arms (e.g., wheelchair or bedside chair)?: A Little Help needed to walk in hospital room?: A Little Help needed climbing 3-5 steps with a railing? : A Little 6 Click Score: 20    End of Session Equipment Utilized During Treatment: Gait belt Activity Tolerance: Patient limited by pain (and malaise) Patient left: in bed;with call bell/phone within reach;with family/visitor  present Nurse Communication: Mobility status PT Visit Diagnosis: Other abnormalities of gait and mobility (R26.89);Pain Pain - Right/Left: Right Pain - part of body: Knee     Time: 1355-1444 PT Time Calculation (min) (ACUTE ONLY): 49 min  Charges:  $Gait Training: 23-37 mins $Therapeutic Activity: 8-22 mins                     Van Clines, PT  Acute Rehabilitation Services Office [phone] Secure Chat welcomed    Levi Aland 11/22/2022, 3:23 PM

## 2022-11-23 ENCOUNTER — Encounter (HOSPITAL_COMMUNITY): Payer: Self-pay | Admitting: Orthopaedic Surgery

## 2022-11-23 NOTE — Progress Notes (Signed)
Physical Therapy Treatment Patient Details Name: Brian Gallegos MRN: [id number] DOB: [birth year] Today's Date: 11/23/2022   History of Present Illness Admitted for RTKA, WBAT;  has a past medical history of Arthritis, Cancer (HCC), CHF (congestive heart failure) (HCC), Chronic lower back pain, Coronary artery disease, Early cataracts, bilateral, Family history of adverse reaction to anesthesia, Hypercholesterolemia, Hypertension, Migraine, Restless leg syndrome, and Wears glasses.    PT Comments    Patient is agreeable to PT. He reports pain control has improved since yesterday. Advanced gait training into the hallway with rolling walker with cues for improved gait kinematics. Patient has upper body fatigue while walking with rolling walker and needs several standing rest breaks with only supervision required. Supportive family planning to assist as needed at discharge. Mobility is adequate for discharge home with family support when medically ready. Recommend to continue PT to maximize independence and decrease caregiver burden.    Recommendations for follow up therapy are one component of a multi-disciplinary discharge planning process, led by the attending physician.  Recommendations may be updated based on patient status, additional functional criteria and insurance authorization.  Follow Up Recommendations       Assistance Recommended at Discharge PRN  Patient can return home with the following Help with stairs or ramp for entrance;Assist for transportation   Equipment Recommendations  None recommended by PT    Recommendations for Other Services       Precautions / Restrictions Precautions Precautions: Knee Precaution Booklet Issued: Yes (comment) Restrictions Weight Bearing Restrictions: Yes RLE Weight Bearing: Weight bearing as tolerated     Mobility  Bed Mobility Overal bed mobility: Needs Assistance Bed Mobility: Supine to Sit, Sit to Supine     Supine to sit:  Supervision Sit to supine: Min assist   General bed mobility comments: intermittent physical assistance for RLE support. tips provided to increase independence    Transfers Overall transfer level: Needs assistance Equipment used: Rolling walker (2 wheels) Transfers: Sit to/from Stand Sit to Stand: Supervision           General transfer comment: cues for hand placement    Ambulation/Gait Ambulation/Gait assistance: Supervision Gait Distance (Feet): 120 Feet Assistive device: Rolling walker (2 wheels) Gait Pattern/deviations: Step-to pattern, Decreased step length - right, Decreased stance time - right, Decreased dorsiflexion - right, Knee flexed in stance - right Gait velocity: decreased     General Gait Details: cues for right heel contact and decreased knee flexion. several standing rest breaks required due to upper extremity fatigue with activity using rolling walker. no physical assistance required for ambulation. knee pain much improved from yesterday   Stairs             Wheelchair Mobility    Modified Rankin (Stroke Patients Only)       Balance                                            Cognition Arousal/Alertness: Awake/alert Behavior During Therapy: WFL for tasks assessed/performed Overall Cognitive Status: Within Functional Limits for tasks assessed                                          Exercises Total Joint Exercises Goniometric ROM: 60 degrees right knee flexion, limited by pain Other Exercises  Other Exercises: patient too fatigued to attempt exercises after ambulation. reinforced HEP with handout in the room    General Comments        Pertinent Vitals/Pain Pain Assessment Pain Assessment: 0-10 Pain Score: 4  Pain Location: R knee Pain Descriptors / Indicators: Sore Pain Intervention(s): Limited activity within patient's tolerance, Monitored during session, Repositioned (encouraged polar care)     Home Living                          Prior Function            PT Goals (current goals can now be found in the care plan section) Acute Rehab PT Goals Patient Stated Goal: back to golf PT Goal Formulation: With patient Time For Goal Achievement: 11/29/22 Potential to Achieve Goals: Good Progress towards PT goals: Progressing toward goals    Frequency    7X/week      PT Plan Current plan remains appropriate    Co-evaluation              AM-PAC PT "6 Clicks" Mobility   Outcome Measure  Help needed turning from your back to your side while in a flat bed without using bedrails?: None Help needed moving from lying on your back to sitting on the side of a flat bed without using bedrails?: None Help needed moving to and from a bed to a chair (including a wheelchair)?: A Little Help needed standing up from a chair using your arms (e.g., wheelchair or bedside chair)?: A Little Help needed to walk in hospital room?: A Little Help needed climbing 3-5 steps with a railing? : A Little 6 Click Score: 20    End of Session Equipment Utilized During Treatment: Gait belt Activity Tolerance: Patient tolerated treatment well Patient left: in bed;with call bell/phone within reach;with family/visitor present (polar care re-applied R knee) Nurse Communication: Mobility status PT Visit Diagnosis: Other abnormalities of gait and mobility (R26.89);Pain Pain - Right/Left: Right Pain - part of body: Knee     Time: 1610-9604 PT Time Calculation (min) (ACUTE ONLY): 27 min  Charges:  $Gait Training: 23-37 mins                    Donna Bernard, PT, MPT    Ina Homes 11/23/2022, 1:08 PM

## 2022-11-23 NOTE — Progress Notes (Signed)
Patient ID: Brian Gallegos, male   DOB: 1946-06-06, 77 y.o.   MRN: [id number] The patient is awake and alert.  His pain is well-controlled.  His vital signs are stable.  I did look at his right knee and the dressing is clean and dry.  I did change regardless.  He has not had a bowel movement but he understands it does not does not keep him in the hospital and that mobility and over-the-counter stool softeners will still certainly help and he is fine with being discharged home this afternoon.

## 2022-11-23 NOTE — Discharge Summary (Signed)
Patient ID: Brian Gallegos MRN: [id number] DOB/AGE: 1946/05/25 77 y.o.  Admit date: 11/21/2022 Discharge date: 11/23/2022  Admission Diagnoses:  Principal Problem:   Status post total right knee replacement Active Problems:   Unilateral primary osteoarthritis, right knee   Discharge Diagnoses:  Same  Past Medical History:  Diagnosis Date   Arthritis    "all over"   Cancer [facility])    Skin Cancer removed off back   CHF (congestive heart failure) (HCC)    Chronic lower back pain    Coronary artery disease    DES pRCA 10/12/15   Early cataracts, bilateral    Family history of adverse reaction to anesthesia    "son had ligament repair of elbow in 2016; woke up nauseated and disoriented"   Hypercholesterolemia    Hypertension    Migraine    Restless leg syndrome    Wears glasses     Surgeries: Procedure(s): RIGHT TOTAL KNEE ARTHROPLASTY on 11/21/2022   Consultants:   Discharged Condition: Improved  Hospital Course: Brian Gallegos is an 77 y.o. male who was admitted 11/21/2022 for operative treatment ofStatus post total right knee replacement. Patient has severe unremitting pain that affects sleep, daily activities, and work/hobbies. After pre-op clearance the patient was taken to the operating room on 11/21/2022 and underwent  Procedure(s): RIGHT TOTAL KNEE ARTHROPLASTY.    Patient was given perioperative antibiotics:  Anti-infectives (From admission, onward)    Start     Dose/Rate Route Frequency Ordered Stop   11/21/22 1900  ceFAZolin (ANCEF) IVPB 1 g/50 mL premix        1 g 100 mL/hr over 30 Minutes Intravenous Every 6 hours 11/21/22 1801 11/22/22 0152   11/21/22 1145  ceFAZolin (ANCEF) IVPB 2g/100 mL premix        2 g 200 mL/hr over 30 Minutes Intravenous On call to O.R. 11/21/22 1144 11/21/22 1536        Patient was given sequential compression devices, early ambulation, and chemoprophylaxis to prevent DVT.  Patient benefited maximally from hospital stay  and there were no complications.    Recent vital signs: Patient Vitals for the past 24 hrs:  BP Temp Temp src Pulse Resp SpO2  11/23/22 1342 (!) 119/59 99.1 F (37.3 C) Oral 85 18 98 %  11/23/22 0739 (!) 101/58 99.3 F (37.4 C) Oral (!) 101 17 (!) 88 %  11/23/22 0448 123/72 98.2 F (36.8 C) Oral (!) 105 18 90 %  11/22/22 2200 (!) 168/84 98.3 F (36.8 C) Oral 91 18 93 %     Recent laboratory studies:  Recent Labs    11/22/22 0337  WBC 12.5*  HGB 12.9*  HCT 39.8  PLT 188  NA 135  K 4.3  CL 102  CO2 25  BUN 16  CREATININE 1.05  GLUCOSE 139*  CALCIUM 8.8*     Discharge Medications:   Allergies as of 11/23/2022       Reactions   Dulcolax [bisacodyl] Other (See Comments)   Severe stomach cramps   Codeine Itching   Low amount is tolerated   Proscar [finasteride] Hives, Rash        Medication List     STOP taking these medications    aspirin EC 81 MG tablet Replaced by: aspirin 81 MG chewable tablet       TAKE these medications    Advil Dual Action 125-250 MG Tabs Generic drug: Ibuprofen-Acetaminophen Take 2 tablets by mouth every 8 (eight) hours as needed (pain).  aspirin 81 MG chewable tablet Chew 1 tablet (81 mg total) by mouth 2 (two) times daily. Replaces: aspirin EC 81 MG tablet   bisoprolol 5 MG tablet Commonly known as: ZEBETA Take 0.5 tablets (2.5 mg total) by mouth daily.   Cholecalciferol 25 MCG (1000 UT) tablet Take 1,000 Units by mouth in the morning.   diclofenac Sodium 1 % Gel Commonly known as: VOLTAREN Apply 1 Application topically as needed (pain).   DULoxetine 20 MG capsule Commonly known as: CYMBALTA TAKE 1 CAPSULE(20 MG) BY MOUTH DAILY   empagliflozin 10 MG Tabs tablet Commonly known as: Jardiance TAKE 1 TABLET(10 MG) BY MOUTH EVERY MORNING   Entresto 49-51 MG Generic drug: sacubitril-valsartan Take 1 tablet by mouth 2 (two) times daily.   furosemide 40 MG tablet Commonly known as: LASIX Take 40 mg by mouth  daily.   gabapentin 300 MG capsule Commonly known as: NEURONTIN TAKE 1 CAPSULE(300 MG) BY MOUTH AT BEDTIME   lubiprostone 8 MCG capsule Commonly known as: AMITIZA Take 8 mcg by mouth daily.   methocarbamol 750 MG tablet Commonly known as: ROBAXIN Take 750 mg by mouth at bedtime. What changed: Another medication with the same name was added. Make sure you understand how and when to take each.   methocarbamol 500 MG tablet Commonly known as: ROBAXIN Take 1 tablet (500 mg total) by mouth every 6 (six) hours as needed for muscle spasms. What changed: You were already taking a medication with the same name, and this prescription was added. Make sure you understand how and when to take each.   nitroGLYCERIN 0.4 MG SL tablet Commonly known as: Nitrostat Place 1 tablet (0.4 mg total) under the tongue every 5 (five) minutes as needed for chest pain. If you require more than two tablets five minutes apart go to the nearest ER via EMS.   oxyCODONE 5 MG immediate release tablet Commonly known as: Oxy IR/ROXICODONE Take 1-2 tablets (5-10 mg total) by mouth every 4 (four) hours as needed for moderate pain (pain score 4-6).   oxymetazoline 0.05 % nasal spray Commonly known as: AFRIN Place 1 spray into both nostrils 2 (two) times daily as needed for congestion.   pyridoxine 100 MG tablet Commonly known as: B-6 Take 100 mg by mouth in the morning.   rOPINIRole 2 MG tablet Commonly known as: REQUIP Take 4 mg by mouth See admin instructions. Take 4 mg in the afternoon and 4 mg at bedtime, may take a third 4 mg dose as needed for restless legs   rosuvastatin 40 MG tablet Commonly known as: CRESTOR Take 20 mg by mouth at bedtime.   spironolactone 25 MG tablet Commonly known as: ALDACTONE Take 1 tablet (25 mg total) by mouth daily.   tadalafil 20 MG tablet Commonly known as: CIALIS Take 1 tablet (20 mg total) by mouth daily as needed for erectile dysfunction. What changed: Another  medication with the same name was changed. Make sure you understand how and when to take each.   tadalafil 5 MG tablet Commonly known as: CIALIS Take 1 tablet (5 mg total) by mouth daily. What changed: when to take this   tamsulosin 0.4 MG Caps capsule Commonly known as: FLOMAX TAKE 1 CAPSULE(0.4 MG) BY MOUTH IN THE MORNING AND AT BEDTIME   traZODone 100 MG tablet Commonly known as: DESYREL Take 100 mg by mouth at bedtime.   vitamin C 250 MG tablet Commonly known as: ASCORBIC ACID Take 250 mg by mouth daily.  Durable Medical Equipment  (From admission, onward)           Start     Ordered   11/21/22 1802  DME 3 n 1  Once        11/21/22 1801   11/21/22 1802  DME Walker rolling  Once       Question Answer Comment  Walker: With 5 Inch Wheels   Patient needs a walker to treat with the following condition Status post total right knee replacement      11/21/22 1801            Diagnostic Studies: DG Knee Right Port  Result Date: 11/21/2022 CLINICAL DATA:  Postoperative. EXAM: PORTABLE RIGHT KNEE - 1-2 VIEW COMPARISON:  Right knee radiographs 08/24/2021 FINDINGS: Interval total right knee arthroplasty. No perihardware lucency is seen to indicate hardware failure or loosening. Expected postoperative changes including moderate joint fluid, intra-articular air, and anterior subcutaneous air. Anterior surgical skin staples. No acute fracture or dislocation. IMPRESSION: Interval total right knee arthroplasty without evidence of hardware failure. Electronically Signed   By: Neita Garnet M.D.   On: 11/21/2022 17:21    Disposition: Discharge disposition: 01-Home or Self Care          Follow-up Information     Kathryne Hitch, MD Follow up in 2 week(s).   Specialty: Orthopedic Surgery Contact information: [address] [place] [address] [phone]         [facility] Follow up.   Specialty: Home Health Services Why:  for home health services Contact information: [address] [place] [place] [address] [phone]                  Signed: Kathryne Hitch 11/23/2022, 3:23 PM

## 2022-11-24 ENCOUNTER — Telehealth: Payer: Self-pay | Admitting: *Deleted

## 2022-11-24 ENCOUNTER — Telehealth: Payer: Self-pay | Admitting: Orthopaedic Surgery

## 2022-11-24 ENCOUNTER — Other Ambulatory Visit: Payer: Self-pay

## 2022-11-24 ENCOUNTER — Observation Stay (HOSPITAL_COMMUNITY)
Admission: EM | Admit: 2022-11-24 | Discharge: 2022-11-26 | Disposition: A | Discharge status: Home Health | Payer: Medicare PPO | Attending: Family Medicine | Admitting: Family Medicine

## 2022-11-24 ENCOUNTER — Emergency Department (HOSPITAL_COMMUNITY): Payer: Medicare PPO

## 2022-11-24 ENCOUNTER — Encounter (HOSPITAL_COMMUNITY): Payer: Self-pay

## 2022-11-24 DIAGNOSIS — Z96651 Presence of right artificial knee joint: Secondary | ICD-10-CM | POA: Diagnosis not present

## 2022-11-24 DIAGNOSIS — I5042 Chronic combined systolic (congestive) and diastolic (congestive) heart failure: Secondary | ICD-10-CM | POA: Diagnosis not present

## 2022-11-24 DIAGNOSIS — I110 Hypertensive heart disease with heart failure: Secondary | ICD-10-CM | POA: Diagnosis not present

## 2022-11-24 DIAGNOSIS — Z85828 Personal history of other malignant neoplasm of skin: Secondary | ICD-10-CM | POA: Diagnosis not present

## 2022-11-24 DIAGNOSIS — I959 Hypotension, unspecified: Secondary | ICD-10-CM

## 2022-11-24 DIAGNOSIS — I2510 Atherosclerotic heart disease of native coronary artery without angina pectoris: Secondary | ICD-10-CM | POA: Insufficient documentation

## 2022-11-24 DIAGNOSIS — Z79899 Other long term (current) drug therapy: Secondary | ICD-10-CM | POA: Insufficient documentation

## 2022-11-24 DIAGNOSIS — R0902 Hypoxemia: Secondary | ICD-10-CM

## 2022-11-24 DIAGNOSIS — N179 Acute kidney failure, unspecified: Secondary | ICD-10-CM | POA: Insufficient documentation

## 2022-11-24 DIAGNOSIS — D62 Acute posthemorrhagic anemia: Principal | ICD-10-CM | POA: Insufficient documentation

## 2022-11-24 DIAGNOSIS — Z96653 Presence of artificial knee joint, bilateral: Secondary | ICD-10-CM | POA: Diagnosis not present

## 2022-11-24 DIAGNOSIS — Z955 Presence of coronary angioplasty implant and graft: Secondary | ICD-10-CM | POA: Insufficient documentation

## 2022-11-24 DIAGNOSIS — M545 Low back pain, unspecified: Secondary | ICD-10-CM | POA: Diagnosis not present

## 2022-11-24 DIAGNOSIS — Z471 Aftercare following joint replacement surgery: Secondary | ICD-10-CM | POA: Diagnosis not present

## 2022-11-24 DIAGNOSIS — E7800 Pure hypercholesterolemia, unspecified: Secondary | ICD-10-CM | POA: Diagnosis not present

## 2022-11-24 DIAGNOSIS — M1990 Unspecified osteoarthritis, unspecified site: Secondary | ICD-10-CM | POA: Diagnosis not present

## 2022-11-24 DIAGNOSIS — G8929 Other chronic pain: Secondary | ICD-10-CM | POA: Diagnosis not present

## 2022-11-24 DIAGNOSIS — D649 Anemia, unspecified: Secondary | ICD-10-CM | POA: Diagnosis present

## 2022-11-24 LAB — URINALYSIS, ROUTINE W REFLEX MICROSCOPIC
Bilirubin Urine: NEGATIVE
Glucose, UA: >=500 mg/dL — AB
Hgb urine dipstick: NEGATIVE
Ketones, ur: NEGATIVE mg/dL
Leukocytes,Ua: NEGATIVE
Nitrite: NEGATIVE
Protein, ur: NEGATIVE mg/dL
Specific Gravity, Urine: 1.012 (ref 1.005–1.030)
pH: 5 (ref 5.0–8.0)

## 2022-11-24 LAB — CBC WITH DIFFERENTIAL/PLATELET
Abs Immature Granulocytes: 0.03 10*3/uL (ref 0.00–0.07)
Basophils Absolute: 0 10*3/uL (ref 0.0–0.1)
Basophils Relative: 0 %
Eosinophils Absolute: 0.2 10*3/uL (ref 0.0–0.5)
Eosinophils Relative: 2 %
HCT: 29.9 % — ABNORMAL LOW (ref 39.0–52.0)
Hemoglobin: 9.7 g/dL — ABNORMAL LOW (ref 13.0–17.0)
Immature Granulocytes: 0 %
Lymphocytes Relative: 13 %
Lymphs Abs: 1.2 10*3/uL (ref 0.7–4.0)
MCH: 32 pg (ref 26.0–34.0)
MCHC: 32.4 g/dL (ref 30.0–36.0)
MCV: 98.7 fL (ref 80.0–100.0)
Monocytes Absolute: 1.3 10*3/uL — ABNORMAL HIGH (ref 0.1–1.0)
Monocytes Relative: 15 %
Neutro Abs: 6.4 10*3/uL (ref 1.7–7.7)
Neutrophils Relative %: 70 %
Platelets: 140 10*3/uL — ABNORMAL LOW (ref 150–400)
RBC: 3.03 MIL/uL — ABNORMAL LOW (ref 4.22–5.81)
RDW: 13.2 % (ref 11.5–15.5)
WBC: 9.1 10*3/uL (ref 4.0–10.5)
nRBC: 0 % (ref 0.0–0.2)

## 2022-11-24 LAB — COMPREHENSIVE METABOLIC PANEL
ALT: 14 U/L (ref 0–44)
AST: 25 U/L (ref 15–41)
Albumin: 2.7 g/dL — ABNORMAL LOW (ref 3.5–5.0)
Alkaline Phosphatase: 52 U/L (ref 38–126)
Anion gap: 8 (ref 5–15)
BUN: 33 mg/dL — ABNORMAL HIGH (ref 8–23)
CO2: 29 mmol/L (ref 22–32)
Calcium: 8.1 mg/dL — ABNORMAL LOW (ref 8.9–10.3)
Chloride: 95 mmol/L — ABNORMAL LOW (ref 98–111)
Creatinine, Ser: 2.11 mg/dL — ABNORMAL HIGH (ref 0.61–1.24)
GFR, Estimated: 32 mL/min — ABNORMAL LOW (ref >60)
Glucose, Bld: 121 mg/dL — ABNORMAL HIGH (ref 70–99)
Potassium: 3.6 mmol/L (ref 3.5–5.1)
Sodium: 132 mmol/L — ABNORMAL LOW (ref 135–145)
Total Bilirubin: 0.6 mg/dL (ref 0.3–1.2)
Total Protein: 5.7 g/dL — ABNORMAL LOW (ref 6.5–8.1)

## 2022-11-24 LAB — LACTIC ACID, PLASMA: Lactic Acid, Venous: 0.8 mmol/L (ref 0.5–1.9)

## 2022-11-24 LAB — TYPE AND SCREEN
ABO/RH(D): O POS
Antibody Screen: NEGATIVE

## 2022-11-24 LAB — MAGNESIUM: Magnesium: 2.3 mg/dL (ref 1.7–2.4)

## 2022-11-24 MED ORDER — ONDANSETRON HCL 4 MG/2ML IJ SOLN
4.0000 mg | Freq: Four times a day (QID) | INTRAMUSCULAR | Status: DC | PRN
  Administered 2022-11-25: 4 mg via INTRAVENOUS
  Filled 2022-11-24: qty 2

## 2022-11-24 MED ORDER — METHOCARBAMOL 500 MG PO TABS
500.0000 mg | ORAL_TABLET | Freq: Four times a day (QID) | ORAL | Status: DC | PRN
  Administered 2022-11-25 (×2): 500 mg via ORAL
  Filled 2022-11-24 (×2): qty 1

## 2022-11-24 MED ORDER — ACETAMINOPHEN 650 MG RE SUPP: 650.0000 mg | Freq: Four times a day (QID) | RECTAL | Status: DC | PRN

## 2022-11-24 MED ORDER — TRAZODONE HCL 50 MG PO TABS
100.0000 mg | ORAL_TABLET | Freq: Every day | ORAL | Status: DC
  Administered 2022-11-24 – 2022-11-25 (×2): 100 mg via ORAL
  Filled 2022-11-24 (×2): qty 2

## 2022-11-24 MED ORDER — OXYCODONE HCL 5 MG PO TABS
5.0000 mg | ORAL_TABLET | ORAL | Status: DC | PRN
  Administered 2022-11-24: 5 mg via ORAL
  Filled 2022-11-24: qty 1

## 2022-11-24 MED ORDER — ROPINIROLE HCL 1 MG PO TABS
4.0000 mg | ORAL_TABLET | ORAL | Status: DC
  Administered 2022-11-24 – 2022-11-26 (×4): 4 mg via ORAL
  Filled 2022-11-24 (×4): qty 4

## 2022-11-24 MED ORDER — TAMSULOSIN HCL 0.4 MG PO CAPS
0.4000 mg | ORAL_CAPSULE | Freq: Two times a day (BID) | ORAL | Status: DC
  Administered 2022-11-24 – 2022-11-26 (×4): 0.4 mg via ORAL
  Filled 2022-11-24 (×4): qty 1

## 2022-11-24 MED ORDER — ACETAMINOPHEN 325 MG PO TABS
650.0000 mg | ORAL_TABLET | Freq: Four times a day (QID) | ORAL | Status: DC | PRN
  Administered 2022-11-25 – 2022-11-26 (×4): 650 mg via ORAL
  Filled 2022-11-24 (×4): qty 2

## 2022-11-24 MED ORDER — DULOXETINE HCL 20 MG PO CPEP
20.0000 mg | ORAL_CAPSULE | Freq: Every day | ORAL | Status: DC
  Administered 2022-11-24 – 2022-11-26 (×3): 20 mg via ORAL
  Filled 2022-11-24 (×3): qty 1

## 2022-11-24 MED ORDER — GABAPENTIN 300 MG PO CAPS
300.0000 mg | ORAL_CAPSULE | Freq: Every day | ORAL | Status: DC
  Administered 2022-11-24 – 2022-11-25 (×2): 300 mg via ORAL
  Filled 2022-11-24 (×2): qty 1

## 2022-11-24 MED ORDER — ROSUVASTATIN CALCIUM 20 MG PO TABS
20.0000 mg | ORAL_TABLET | Freq: Every day | ORAL | Status: DC
  Administered 2022-11-24 – 2022-11-25 (×2): 20 mg via ORAL
  Filled 2022-11-24 (×2): qty 1

## 2022-11-24 MED ORDER — LACTATED RINGERS IV BOLUS
500.0000 mL | Freq: Once | INTRAVENOUS | Status: AC
  Administered 2022-11-24: 500 mL via INTRAVENOUS

## 2022-11-24 MED ORDER — VITAMIN B-6 100 MG PO TABS
100.0000 mg | ORAL_TABLET | Freq: Every morning | ORAL | Status: DC
  Administered 2022-11-25 – 2022-11-26 (×2): 100 mg via ORAL
  Filled 2022-11-24 (×2): qty 1

## 2022-11-24 MED ORDER — ONDANSETRON HCL 4 MG PO TABS
4.0000 mg | ORAL_TABLET | Freq: Four times a day (QID) | ORAL | Status: DC | PRN
  Administered 2022-11-25: 4 mg via ORAL
  Filled 2022-11-24: qty 1

## 2022-11-24 MED ORDER — LACTATED RINGERS IV SOLN: Freq: Once | INTRAVENOUS | Status: AC

## 2022-11-24 NOTE — ED Notes (Signed)
ED TO INPATIENT HANDOFF REPORT  ED Nurse Name and Phone #: Alycia Rossetti [phone]   S Name/Age/Gender Brian Gallegos 77 y.o. male Room/Bed: 024C/024C  Code Status   Code Status: Full Code  Home/SNF/Other Home Patient oriented to: self, place, time, and situation Is this baseline? Yes   Triage Complete: Triage complete  Chief Complaint AKI (acute kidney injury) (HCC) [N17.9]  Triage Note Pt was receiving PT post total knee replacement PT called when they arrived for low BP.  On ems arrival they report pt being orthostatic 74/40 standing 2L placed for low O2 90% on their arrival. Pt has no complaints   Currently 115/54 currently  CBG 158 98% 2L 500 NS given         Allergies Allergies  Allergen Reactions   Dulcolax [Bisacodyl] Other (See Comments)    Severe stomach cramps   Codeine Itching    Low amount is tolerated   Proscar [Finasteride] Hives and Rash    Level of Care/Admitting Diagnosis ED Disposition     ED Disposition  Admit   Condition  --   Comment  Hospital Area: [facility] [[id number]]  Level of Care: Med-Surg [16]  May place patient in observation at [facility] or [facility] if equivalent level of care is available:: No  Covid Evaluation: Asymptomatic - no recent exposure (last 10 days) testing not required  Diagnosis: AKI (acute kidney injury) [facility]) [[id number]]  Admitting Physician: Hillary Bow [phone]  Attending Physician: Hillary Bow [[id number]]          B Medical/Surgery History Past Medical History:  Diagnosis Date   Arthritis    "all over"   Cancer (HCC)    Skin Cancer removed off back   CHF (congestive heart failure) (HCC)    Chronic lower back pain    Coronary artery disease    DES pRCA 10/12/15   Early cataracts, bilateral    Family history of adverse reaction to anesthesia    "son had ligament repair of elbow in 2016; woke up nauseated and disoriented"   Hypercholesterolemia    Hypertension    Migraine     Restless leg syndrome    Wears glasses    Past Surgical History:  Procedure Laterality Date   ANTERIOR CERVICAL DECOMP/DISCECTOMY FUSION  ~ 2005   ANTERIOR CERVICAL DISCECTOMY     BACK SURGERY     CARDIAC CATHETERIZATION N/A 10/12/2015   Procedure: Left Heart Cath and Coronary Angiography;  Surgeon: Yates Decamp, MD;  Location: [facility] INVASIVE CV LAB;  Service: Cardiovascular;  Laterality: N/A;   CARDIAC CATHETERIZATION  10/12/2015   Procedure: Coronary Stent Intervention;  Surgeon: Yates Decamp, MD;  Location: [facility] INVASIVE CV LAB;  Service: Cardiovascular;;   COLONOSCOPY     CORONARY ANGIOPLASTY  10/12/2015   DES RCA   CYSTOSCOPY WITH INSERTION OF UROLIFT N/A 05/02/2021   Procedure: CYSTOSCOPY WITH INSERTION OF UROLIFT;  Surgeon: Malen Gauze, MD;  Location: [facility] ORS;  Service: Urology;  Laterality: N/A;   EYE SURGERY Left    cataract   FRACTURE SURGERY     nose repaired from accident   GLAUCOMA SURGERY Left 03/04/2015   JOINT REPLACEMENT     KNEE ARTHROSCOPY Bilateral    LEFT HEART CATH AND CORONARY ANGIOGRAPHY N/A 01/17/2022   Procedure: LEFT HEART CATH AND CORONARY ANGIOGRAPHY;  Surgeon: Yates Decamp, MD;  Location: [facility] INVASIVE CV LAB;  Service: Cardiovascular;  Laterality: N/A;   LUMBAR DISC SURGERY  ~ 1986-02/08/2007 X  6   MAXIMUM ACCESS (MAS)POSTERIOR LUMBAR INTERBODY FUSION (PLIF) 3 LEVEL  02/08/2007   w/rods and screws   RIGHT HEART CATH N/A 02/21/2022   Procedure: RIGHT HEART CATH;  Surgeon: Laurey Morale, MD;  Location: [facility] INVASIVE CV LAB;  Service: Cardiovascular;  Laterality: N/A;   SPINAL CORD STIMULATOR INSERTION N/A 05/08/2014   Procedure: LUMBAR SPINAL CORD STIMULATOR IMPLANT ;  Surgeon: Gwynne Edinger, MD;  Location: [facility] NEURO ORS;  Service: Neurosurgery;  Laterality: N/A;   TOE FUSION Right 06/02/2014   "outside part of big toe"   TOTAL KNEE ARTHROPLASTY Left ~ 2011   TOTAL KNEE ARTHROPLASTY Right 11/21/2022   Procedure: RIGHT TOTAL KNEE ARTHROPLASTY;  Surgeon:  Kathryne Hitch, MD;  Location: [facility] OR;  Service: Orthopedics;  Laterality: Right;     A IV Location/Drains/Wounds Patient Lines/Drains/Airways Status     Active Line/Drains/Airways     Name Placement date Placement time Site Days   Peripheral IV 11/24/22 18 G Left Antecubital 11/24/22  1600  Antecubital  less than 1            Intake/Output Last 24 hours No intake or output data in the 24 hours ending 11/24/22 2036  Labs/Imaging Results for orders placed or performed during the hospital encounter of 11/24/22 (from the past 48 hour(s))  CBC with Differential     Status: Abnormal   Collection Time: 11/24/22  5:25 PM  Result Value Ref Range   WBC 9.1 4.0 - 10.5 K/uL   RBC 3.03 (L) 4.22 - 5.81 MIL/uL   Hemoglobin 9.7 (L) 13.0 - 17.0 g/dL   HCT 29.5 (L) 28.4 - 13.2 %   MCV 98.7 80.0 - 100.0 fL   MCH 32.0 26.0 - 34.0 pg   MCHC 32.4 30.0 - 36.0 g/dL   RDW 44.0 10.2 - 72.5 %   Platelets 140 (L) 150 - 400 K/uL   nRBC 0.0 0.0 - 0.2 %   Neutrophils Relative % 70 %   Neutro Abs 6.4 1.7 - 7.7 K/uL   Lymphocytes Relative 13 %   Lymphs Abs 1.2 0.7 - 4.0 K/uL   Monocytes Relative 15 %   Monocytes Absolute 1.3 (H) 0.1 - 1.0 K/uL   Eosinophils Relative 2 %   Eosinophils Absolute 0.2 0.0 - 0.5 K/uL   Basophils Relative 0 %   Basophils Absolute 0.0 0.0 - 0.1 K/uL   Immature Granulocytes 0 %   Abs Immature Granulocytes 0.03 0.00 - 0.07 K/uL    Comment: Performed at [facility], [address] [place], [place] [address]  Comprehensive metabolic panel     Status: Abnormal   Collection Time: 11/24/22  5:25 PM  Result Value Ref Range   Sodium 132 (L) 135 - 145 mmol/L   Potassium 3.6 3.5 - 5.1 mmol/L   Chloride 95 (L) 98 - 111 mmol/L   CO2 29 22 - 32 mmol/L   Glucose, Bld 121 (H) 70 - 99 mg/dL    Comment: Glucose reference range applies only to samples taken after fasting for at least 8 hours.   BUN 33 (H) 8 - 23 mg/dL   Creatinine, Ser 0.34 (H) 0.61 - 1.24 mg/dL     Comment: DELTA CHECK NOTED   Calcium 8.1 (L) 8.9 - 10.3 mg/dL   Total Protein 5.7 (L) 6.5 - 8.1 g/dL   Albumin 2.7 (L) 3.5 - 5.0 g/dL   AST 25 15 - 41 U/L   ALT 14 0 - 44  U/L   Alkaline Phosphatase 52 38 - 126 U/L   Total Bilirubin 0.6 0.3 - 1.2 mg/dL   GFR, Estimated 32 (L) >60 mL/min    Comment: (NOTE) Calculated using the CKD-EPI Creatinine Equation (2021)    Anion gap 8 5 - 15    Comment: Performed at [facility], [address] [place], [place] [address]  Magnesium     Status: None   Collection Time: 11/24/22  5:25 PM  Result Value Ref Range   Magnesium 2.3 1.7 - 2.4 mg/dL    Comment: Performed at [facility], [address], [place], [place] [address]  Lactic acid, plasma     Status: None   Collection Time: 11/24/22  5:26 PM  Result Value Ref Range   Lactic Acid, Venous 0.8 0.5 - 1.9 mmol/L    Comment: Performed at [facility], [address] [place], [place] [address]   DG CHEST PORT 1 VIEW  Result Date: 11/24/2022 CLINICAL DATA:  Hypotension EXAM: PORTABLE CHEST 1 VIEW COMPARISON:  02/12/2022 FINDINGS: Transverse diameter of heart is increased. There is poor inspiration. New patchy infiltrate is seen in right lower lung field. Costophrenic angles are clear. There is no pneumothorax. There is previous surgical fusion in lower cervical spine. Neurostimulator leads are noted in thoracic region. IMPRESSION: Cardiomegaly.  There are no signs of alveolar pulmonary edema. New patchy infiltrates are seen in right lower lung field suggesting atelectasis/pneumonia. Electronically Signed   By: Ernie Avena M.D.   On: 11/24/2022 18:00    Pending Labs Unresulted Labs (From admission, onward)     Start     Ordered   11/25/22 0500  CBC  Tomorrow morning,   R        11/24/22 2022   11/25/22 0500  Basic metabolic panel  Tomorrow morning,   R        11/24/22 2022   11/24/22 2032  Type and screen [facility]  Once,   R       Comments: [facility]    11/24/22 2031   11/24/22 1725  Urinalysis, Routine w reflex microscopic -Urine, Clean Catch  Once,   URGENT       Question:  Specimen Source  Answer:  Urine, Clean Catch   11/24/22 1725            Vitals/Pain Today's Vitals   11/24/22 1845 11/24/22 1900 11/24/22 1945 11/24/22 2030  BP: (!) 114/53 (!) 124/54 (!) 114/56 (!) 95/48  Pulse: 70 81 90 84  Resp: 14 17 18  (!) 23  Temp:      TempSrc:      SpO2: 100% 98% 91% 93%  Weight:      Height:        Isolation Precautions No active isolations  Medications Medications  rosuvastatin (CRESTOR) tablet 20 mg (has no administration in time range)  rOPINIRole (REQUIP) tablet 4 mg (has no administration in time range)  DULoxetine (CYMBALTA) DR capsule 20 mg (has no administration in time range)  gabapentin (NEURONTIN) capsule 300 mg (has no administration in time range)  oxyCODONE (Oxy IR/ROXICODONE) immediate release tablet 5-10 mg (has no administration in time range)  pyridOXINE (VITAMIN B6) tablet 100 mg (has no administration in time range)  traZODone (DESYREL) tablet 100 mg (has no administration in time range)  tamsulosin (FLOMAX) capsule 0.4 mg (has no administration in time range)  methocarbamol (ROBAXIN) tablet 500 mg (has no administration  in time range)  lactated ringers infusion (has no administration in time range)  acetaminophen (TYLENOL) tablet 650 mg (has no administration in time range)    Or  acetaminophen (TYLENOL) suppository 650 mg (has no administration in time range)  ondansetron (ZOFRAN) tablet 4 mg (has no administration in time range)    Or  ondansetron (ZOFRAN) injection 4 mg (has no administration in time range)  lactated ringers bolus 500 mL (500 mLs Intravenous Bolus 11/24/22 1804)    Mobility walks with device     Focused Assessments    R Recommendations: See Admitting Provider Note  Report given to:   Additional Notes: Very pleasant man, here post knee  surgery with hypotension.

## 2022-11-24 NOTE — ED Notes (Addendum)
Pt placed back on 2L while sleeping dropped to 87 and 88%when aroused saturations come back up to 90-94% on RA

## 2022-11-24 NOTE — H&P (Signed)
History and Physical    Patient: Brian Gallegos:[id number] DOB: 1945-08-16 DOA: 11/24/2022 DOS: the patient was seen and examined on 11/24/2022 PCP: Sigmund Hazel, MD  Patient coming from: Home  Chief Complaint:  Chief Complaint  Patient presents with   Hypotension   HPI: [name] is a 77 y.o. male with medical history significant of CHF, CAD, hypertension, hyperlipidemia.  Pt presents to ED for HYPOtension.  Pt underwent R TKA on 5/21. HGB 12.9 on 5/22 (not really changed since pre-op). Discharged 5/23.  Today working with PT, they checked BP and noted he was hypotensive to the 70s.    He is felt somewhat fatigued but is otherwise been asymptomatic. No dizziness, syncope, chest pain, difficulty breathing. Some mild cough and some low-grade fever yesterday to about 100 even, no fever today. No vomiting, he did have some diarrhea today after having bowel regimen.   States his blood pressure is usually in the 130 systolic. Has not had any new lower extremity edema nor orthopnea.  His pain in his right knee is much improved, he has not needed any pain medications today. He had some swelling around his knee that has not acutely worsened.   Denies melena, denies hematochezia.   Review of Systems: As mentioned in the history of present illness. All other systems reviewed and are negative. Past Medical History:  Diagnosis Date   Arthritis    "all over"   Cancer [facility])    Skin Cancer removed off back   CHF (congestive heart failure) (HCC)    Chronic lower back pain    Coronary artery disease    DES pRCA 10/12/15   Early cataracts, bilateral    Family history of adverse reaction to anesthesia    "son had ligament repair of elbow in 2016; woke up nauseated and disoriented"   Hypercholesterolemia    Hypertension    Migraine    Restless leg syndrome    Wears glasses    Past Surgical History:  Procedure Laterality Date   ANTERIOR CERVICAL DECOMP/DISCECTOMY FUSION   ~ 2005   ANTERIOR CERVICAL DISCECTOMY     BACK SURGERY     CARDIAC CATHETERIZATION N/A 10/12/2015   Procedure: Left Heart Cath and Coronary Angiography;  Surgeon: Yates Decamp, MD;  Location: [facility] INVASIVE CV LAB;  Service: Cardiovascular;  Laterality: N/A;   CARDIAC CATHETERIZATION  10/12/2015   Procedure: Coronary Stent Intervention;  Surgeon: Yates Decamp, MD;  [facility] INVAS[facility] CV LAB;  Service: Cardiovascular;;   COLONOSCOPY     CORONARY ANGIOPLASTY  10/12/2015   DES RCA   CYSTOSCOPY WITH INSERTION OF UROLIFT N/A 05/02/2021   Procedure: CYSTOSCOPY WITH INSERTION OF UROLIFT;  Surgeon: Malen Gauze, MD;  Location: [facility] ORS;  Service: Urology;  Laterality: N/A;   EYE SURGERY Left    cataract   FRACTURE SURGERY     nose repaired from accident   GLAUCOMA SURGERY Left 03/04/2015   JOINT REPLACEMENT     KNEE ARTHROSCOPY Bilateral    LEFT HEART CATH AND CORONARY ANGIOGRAPHY N/A 01/17/2022   Procedure: LEFT HEART CATH AND CORONARY ANGIOGRAPHY;  Surgeon: Yates Decamp, MD;  Location: [facility] INVASIVE CV LAB;  Service: Cardiovascular;  Laterality: N/A;   LUMBAR DISC SURGERY  ~ 1986-02/08/2007 X 6   MAXIMUM ACCESS (MAS)POSTERIOR LUMBAR INTERBODY FUSION (PLIF) 3 LEVEL  02/08/2007   w/rods and screws   RIGHT HEART CATH N/A 02/21/2022   Procedure: RIGHT HEART CATH;  Surgeon: Laurey Morale, MD;  Location:  [facility] INVASIVE CV LAB;  Service: Cardiovascular;  Laterality: N/A;   SPINAL CORD STIMULATOR INSERTION N/A 05/08/2014   Procedure: LUMBAR SPINAL CORD STIMULATOR IMPLANT ;  Surgeon: Gwynne Edinger, MD;  Location: [facility] NEURO ORS;  Service: Neurosurgery;  Laterality: N/A;   TOE FUSION Right 06/02/2014   "outside part of big toe"   TOTAL KNEE ARTHROPLASTY Left ~ 2011   TOTAL KNEE ARTHROPLASTY Right 11/21/2022   Procedure: RIGHT TOTAL KNEE ARTHROPLASTY;  Surgeon: Kathryne Hitch, MD;  Location: [facility] OR;  Service: Orthopedics;  Laterality: Right;   Social History:  reports that he has never smoked. He has  never used smokeless tobacco. He reports current alcohol use of about 4.0 - 5.0 standard drinks of alcohol per week. He reports that he does not use drugs.  Allergies  Allergen Reactions   Dulcolax [Bisacodyl] Other (See Comments)    Severe stomach cramps   Codeine Itching    Low amount is tolerated   Proscar [Finasteride] Hives and Rash    Family History  Problem Relation Age of Onset   Aneurysm Mother    Heart attack Father    Hypertension Father    CAD Father    Cancer Other     Prior to Admission medications   Medication Sig Start Date End Date Taking? Authorizing Provider  aspirin 81 MG chewable tablet Chew 1 tablet (81 mg total) by mouth 2 (two) times daily. 11/22/22   Kathryne Hitch, MD  bisoprolol (ZEBETA) 5 MG tablet Take 0.5 tablets (2.5 mg total) by mouth daily. 03/21/22   Laurey Morale, MD  Cholecalciferol 25 MCG (1000 UT) tablet Take 1,000 Units by mouth in the morning.    [provider]  diclofenac Sodium (VOLTAREN) 1 % GEL Apply 1 Application topically as needed (pain).    [provider]  DULoxetine (CYMBALTA) 20 MG capsule TAKE 1 CAPSULE(20 MG) BY MOUTH DAILY 12/03/21   Kerrin Champagne, MD  empagliflozin (JARDIANCE) 10 MG TABS tablet TAKE 1 TABLET(10 MG) BY MOUTH EVERY MORNING 11/07/22   Laurey Morale, MD  furosemide (LASIX) 40 MG tablet Take 40 mg by mouth daily.    [provider]  gabapentin (NEURONTIN) 300 MG capsule TAKE 1 CAPSULE(300 MG) BY MOUTH AT BEDTIME 10/31/22   Kathryne Hitch, MD  Ibuprofen-Acetaminophen (ADVIL DUAL ACTION) 125-250 MG TABS Take 2 tablets by mouth every 8 (eight) hours as needed (pain).    [provider]  lubiprostone (AMITIZA) 8 MCG capsule Take 8 mcg by mouth daily. 06/21/20   [provider]  methocarbamol (ROBAXIN) 500 MG tablet Take 1 tablet (500 mg total) by mouth every 6 (six) hours as needed for muscle spasms. 11/22/22   Kathryne Hitch, MD  methocarbamol  (ROBAXIN) 750 MG tablet Take 750 mg by mouth at bedtime.    [provider]  oxyCODONE (OXY IR/ROXICODONE) 5 MG immediate release tablet Take 1-2 tablets (5-10 mg total) by mouth every 4 (four) hours as needed for moderate pain (pain score 4-6). 11/22/22   Kathryne Hitch, MD  oxymetazoline (AFRIN) 0.05 % nasal spray Place 1 spray into both nostrils 2 (two) times daily as needed for congestion.    [provider]  pyridoxine (B-6) 100 MG tablet Take 100 mg by mouth in the morning.    [provider]  rOPINIRole (REQUIP) 2 MG tablet Take 4 mg by mouth See admin instructions. Take 4 mg in the afternoon and 4 mg at bedtime, may  take a third 4 mg dose as needed for restless legs    [provider]  rosuvastatin (CRESTOR) 40 MG tablet Take 20 mg by mouth at bedtime.    [provider]  sacubitril-valsartan (ENTRESTO) 49-51 MG Take 1 tablet by mouth 2 (two) times daily. 06/20/22   Laurey Morale, MD  spironolactone (ALDACTONE) 25 MG tablet Take 1 tablet (25 mg total) by mouth daily. 04/11/22   Laurey Morale, MD  tadalafil (CIALIS) 20 MG tablet Take 1 tablet (20 mg total) by mouth daily as needed for erectile dysfunction. 08/04/22   McKenzie, Mardene Celeste, MD  tadalafil (CIALIS) 5 MG tablet Take 1 tablet (5 mg total) by mouth daily. Patient taking differently: Take 5 mg by mouth at bedtime. 08/04/22   McKenzie, Mardene Celeste, MD  tamsulosin (FLOMAX) 0.4 MG CAPS capsule TAKE 1 CAPSULE(0.4 MG) BY MOUTH IN THE MORNING AND AT BEDTIME 04/03/22   McKenzie, Mardene Celeste, MD  traZODone (DESYREL) 100 MG tablet Take 100 mg by mouth at bedtime.    [provider]  vitamin C (ASCORBIC ACID) 250 MG tablet Take 250 mg by mouth daily.    [provider]    Physical Exam: Vitals:   11/24/22 1830 11/24/22 1845 11/24/22 1900 11/24/22 1945  BP: (!) 120/47 (!) 114/53 (!) 124/54 (!) 114/56  Pulse: 69 70 81 90  Resp: 16 14 17 18   Temp:      TempSrc:      SpO2:  100% 100% 98% 91%  Weight:      Height:       Constitutional: NAD, calm, comfortable Respiratory: clear to auscultation bilaterally, no wheezing, no crackles. Normal respiratory effort. No accessory muscle use.  Cardiovascular: Regular rate and rhythm, no murmurs / rubs / gallops. No extremity edema. 2+ pedal pulses. No carotid bruits.  Abdomen: no tenderness, no masses palpated. No hepatosplenomegaly. Bowel sounds positive.  Musculoskeletal: Patient has some mild erythema and swelling around the right knee.  No significant tenderness.  He has good range of motion, palpable DP and PT pulse bilaterally.  He has no fluctuance, crepitus, or induration.  Neurologic: CN 2-12 grossly intact. Sensation intact, DTR normal. Strength 5/5 in all 4.  Psychiatric: Normal judgment and insight. Alert and oriented x 3. Normal mood.   Data Reviewed:     Labs on Admission: I have personally reviewed following labs and imaging studies  CBC: Recent Labs  Lab 11/22/22 0337 11/24/22 1725  WBC 12.5* 9.1  NEUTROABS  --  6.4  HGB 12.9* 9.7*  HCT 39.8 29.9*  MCV 96.8 98.7  PLT 188 140*   Basic Metabolic Panel: Recent Labs  Lab 11/22/22 0337 11/24/22 1725  NA 135 132*  K 4.3 3.6  CL 102 95*  CO2 25 29  GLUCOSE 139* 121*  BUN 16 33*  CREATININE 1.05 2.11*  CALCIUM 8.8* 8.1*  MG  --  2.3   GFR: Estimated Creatinine Clearance: 32.8 mL/min (A) (by C-G formula based on SCr of 2.11 mg/dL (H)). Liver Function Tests: Recent Labs  Lab 11/24/22 1725  AST 25  ALT 14  ALKPHOS 52  BILITOT 0.6  PROT 5.7*  ALBUMIN 2.7*   BNP (last 3 results) Recent Labs    12/20/21 1355 01/02/22 1103  PROBNP 2,381* 1,455*    Urine analysis:    Component Value Date/Time   COLORURINE YELLOW 06/06/2020 1403   APPEARANCEUR Clear 08/04/2022 0948   LABSPEC 1.009 06/06/2020 1403   PHURINE 7.0 06/06/2020 1403  GLUCOSEU 2+ (A) 08/04/2022 0948   HGBUR MODERATE (A) 06/06/2020 1403   BILIRUBINUR Negative  08/04/2022 0948   KETONESUR NEGATIVE 06/06/2020 1403   PROTEINUR Negative 08/04/2022 0948   PROTEINUR 30 (A) 06/06/2020 1403   UROBILINOGEN 0.2 04/21/2010 1203   NITRITE Negative 08/04/2022 0948   NITRITE NEGATIVE 06/06/2020 1403   LEUKOCYTESUR Negative 08/04/2022 0948   LEUKOCYTESUR SMALL (A) 06/06/2020 1403    Radiological Exams on Admission: DG CHEST PORT 1 VIEW  Result Date: 11/24/2022 CLINICAL DATA:  Hypotension EXAM: PORTABLE CHEST 1 VIEW COMPARISON:  02/12/2022 FINDINGS: Transverse diameter of heart is increased. There is poor inspiration. New patchy infiltrate is seen in right lower lung field. Costophrenic angles are clear. There is no pneumothorax. There is previous surgical fusion in lower cervical spine. Neurostimulator leads are noted in thoracic region. IMPRESSION: Cardiomegaly.  There are no signs of alveolar pulmonary edema. New patchy infiltrates are seen in right lower lung field suggesting atelectasis/pneumonia. Electronically Signed   By: Ernie Avena M.D.   On: 11/24/2022 18:00      Assessment and Plan: * AKI (acute kidney injury) (HCC) Suspect pre-renal in setting of documented hypotension + 3gm HGB drop in past 2 days. Hold home BP meds IVF Strict intake and output Repeat BMP in AM  Anemia Acute anemia with 3 gm HGB drop in past 2 days. ? Bleeding into joint / surgical site. EDP calling ortho surg to see how they want to proceed SCDs only for DVT PPx for the moment Hold ASA IVF Type and screen Repeat CBC in AM  Status post total right knee replacement ? Hemorrhage from knee causing 3gm HGB drop since 5/22? EDP calling ortho surgery to see if they want any specific imaging.  Chronic combined systolic and diastolic heart failure (HCC) Pt suspected to be acutely HYPOvolemic with hypotension, and 3gm HGB drop in past 2 days. Hold home BP meds including entresto and BB Hold diuretics Hold PDE 5      Advance Care Planning:   Code Status: Full  Code  Consults: EDP calling ortho surg  Family Communication: Family at bedside  Severity of Illness: The appropriate patient status for this patient is OBSERVATION. Observation status is judged to be reasonable and necessary in order to provide the required intensity of service to ensure the patient's safety. The patient's presenting symptoms, physical exam findings, and initial radiographic and laboratory data in the context of their medical condition is felt to place them at decreased risk for further clinical deterioration. Furthermore, it is anticipated that the patient will be medically stable for discharge from the hospital within 2 midnights of admission.   Author: Hillary Bow., DO 11/24/2022 8:31 PM  For on call review [email or web address].

## 2022-11-24 NOTE — Assessment & Plan Note (Signed)
Concern for possible hemorrhage related to recent right knee replacement.  Orthopedic surgery consulted without concern for serious hemorrhage.  Evidence of oozing from incision site.  No further recommendations at this time.  PT/OT consulted for recommendations for health PT.

## 2022-11-24 NOTE — Telephone Encounter (Signed)
Attempted D/C call to patient. No answer and left VM. 

## 2022-11-24 NOTE — ED Provider Notes (Signed)
[place] EMERGENCY DEPARTMENT AT [facility] Provider Note   CSN: [id number] Arrival date & time: 11/24/22  1600     History  Chief Complaint  Patient presents with   Hypotension    Brian Gallegos is a 77 y.o. male.  HPI 77 year old male with history of CHF, CAD, hypertension, hyperlipidemia, migraine presenting for hypertension.  Of note, patient was discharged yesterday after right total knee replacement.  Patient was in physical therapy today.  They checked his blood pressure and noted that he was hypotensive.  He is felt somewhat fatigued but is otherwise been asymptomatic.  No dizziness, syncope, chest pain, difficulty breathing.  Some mild cough and some low-grade fever yesterday to about 100 even, no fever today.  No vomiting, he did have some diarrhea today after having bowel regimen.  He did eat much in the hospital but has been eating okay at home.  Normal urine output, no dysuria or hematuria.  States his blood pressure is usually in the 130 systolic.  Has not had any new lower extremity edema orthopnea.  His pain in his right knee is much improved, he has not needed any pain medications today.  He had some swelling around his knee that has not acutely worsened.     Home Medications Prior to Admission medications   Medication Sig Start Date End Date Taking? Authorizing Provider  aspirin 81 MG chewable tablet Chew 1 tablet (81 mg total) by mouth 2 (two) times daily. 11/22/22  Yes Kathryne Hitch, MD  bisoprolol (ZEBETA) 5 MG tablet Take 0.5 tablets (2.5 mg total) by mouth daily. 03/21/22  Yes Laurey Morale, MD  Cholecalciferol 25 MCG (1000 UT) tablet Take 1,000 Units by mouth in the morning.   Yes [provider]  diclofenac Sodium (VOLTAREN) 1 % GEL Apply 1 Application topically as needed (pain).   Yes [provider]  DULoxetine (CYMBALTA) 20 MG capsule TAKE 1 CAPSULE(20 MG) BY MOUTH DAILY Patient taking differently: Take 20 mg by  mouth daily. 12/03/21  Yes Kerrin Champagne, MD  empagliflozin (JARDIANCE) 10 MG TABS tablet TAKE 1 TABLET(10 MG) BY MOUTH EVERY MORNING 11/07/22  Yes Laurey Morale, MD  furosemide (LASIX) 40 MG tablet Take 40 mg by mouth daily.   Yes [provider]  gabapentin (NEURONTIN) 300 MG capsule TAKE 1 CAPSULE(300 MG) BY MOUTH AT BEDTIME 10/31/22  Yes Kathryne Hitch, MD  Ibuprofen-Acetaminophen (ADVIL DUAL ACTION) 125-250 MG TABS Take 2 tablets by mouth every 8 (eight) hours as needed (pain).   Yes [provider]  lubiprostone (AMITIZA) 8 MCG capsule Take 8 mcg by mouth 2 (two) times daily with a meal. 06/21/20  Yes [provider]  methocarbamol (ROBAXIN) 500 MG tablet Take 1 tablet (500 mg total) by mouth every 6 (six) hours as needed for muscle spasms. 11/22/22  Yes Kathryne Hitch, MD  oxyCODONE (OXY IR/ROXICODONE) 5 MG immediate release tablet Take 1-2 tablets (5-10 mg total) by mouth every 4 (four) hours as needed for moderate pain (pain score 4-6). 11/22/22  Yes Kathryne Hitch, MD  oxymetazoline (AFRIN) 0.05 % nasal spray Place 1 spray into both nostrils 2 (two) times daily as needed for congestion.   Yes [provider]  pyridoxine (B-6) 100 MG tablet Take 100 mg by mouth in the morning.   Yes [provider]  rOPINIRole (REQUIP) 2 MG tablet Take 4 mg by mouth See admin instructions. Take 4 mg in the afternoon and  4 mg at bedtime, may take a third 4 mg dose as needed for restless legs   Yes [provider]  rosuvastatin (CRESTOR) 40 MG tablet Take 20 mg by mouth at bedtime.   Yes [provider]  sacubitril-valsartan (ENTRESTO) 49-51 MG Take 1 tablet by mouth 2 (two) times daily. 06/20/22  Yes Laurey Morale, MD  spironolactone (ALDACTONE) 25 MG tablet Take 1 tablet (25 mg total) by mouth daily. 04/11/22  Yes Laurey Morale, MD  tadalafil (CIALIS) 20 MG tablet Take 1 tablet (20 mg total) by mouth daily as needed  for erectile dysfunction. 08/04/22  Yes McKenzie, Mardene Celeste, MD  tadalafil (CIALIS) 5 MG tablet Take 1 tablet (5 mg total) by mouth daily. Patient taking differently: Take 5 mg by mouth at bedtime. 08/04/22  Yes McKenzie, Mardene Celeste, MD  tamsulosin (FLOMAX) 0.4 MG CAPS capsule TAKE 1 CAPSULE(0.4 MG) BY MOUTH IN THE MORNING AND AT BEDTIME Patient taking differently: Take 0.4 mg by mouth in the morning. 04/03/22  Yes McKenzie, Mardene Celeste, MD  traZODone (DESYREL) 100 MG tablet Take 100 mg by mouth at bedtime.   Yes [provider]  vitamin C (ASCORBIC ACID) 250 MG tablet Take 250 mg by mouth daily.   Yes [provider]      Allergies    Dulcolax [bisacodyl], Codeine, and Proscar [finasteride]    Review of Systems   Review of Systems  Constitutional:  Positive for fatigue.  All other systems reviewed and are negative.   Physical Exam Updated Vital Signs BP (!) 156/61 (BP Location: Right Arm)   Pulse 91   Temp 100 F (37.8 C) (Oral)   Resp 18   Ht 5\' 9"  (1.753 m)   Wt 88.4 kg   SpO2 93%   BMI 28.78 kg/m  Physical Exam Vitals and nursing note reviewed.  Constitutional:      General: He is not in acute distress.    Appearance: He is well-developed.  HENT:     Head: Normocephalic and atraumatic.     Nose: Nose normal.     Mouth/Throat:     Mouth: Mucous membranes are moist.     Pharynx: Oropharynx is clear.  Eyes:     Extraocular Movements: Extraocular movements intact.     Conjunctiva/sclera: Conjunctivae normal.     Pupils: Pupils are equal, round, and reactive to light.  Cardiovascular:     Rate and Rhythm: Normal rate and regular rhythm.     Heart sounds: No murmur heard. Pulmonary:     Effort: Pulmonary effort is normal. No respiratory distress.     Breath sounds: Normal breath sounds.  Abdominal:     Palpations: Abdomen is soft.     Tenderness: There is no abdominal tenderness. There is no guarding or rebound.  Musculoskeletal:        General: No  swelling.     Cervical back: Normal range of motion and neck supple. No rigidity.     Right lower leg: No edema.     Left lower leg: No edema.     Comments: Patient has some mild erythema and swelling around the right knee.  No significant tenderness.  He has good range of motion, palpable DP and PT pulse bilaterally.  He has no fluctuance, crepitus, or induration.  Skin:    General: Skin is warm and dry.     Capillary Refill: Capillary refill takes less than 2 seconds.  Neurological:     Mental Status:  He is alert.  Psychiatric:        Mood and Affect: Mood normal.     ED Results / Procedures / Treatments   Labs (all labs ordered are listed, but only abnormal results are displayed) Labs Reviewed  CBC WITH DIFFERENTIAL/PLATELET - Abnormal; Notable for the following components:      Result Value   RBC 3.03 (*)    Hemoglobin 9.7 (*)    HCT 29.9 (*)    Platelets 140 (*)    Monocytes Absolute 1.3 (*)    All other components within normal limits  COMPREHENSIVE METABOLIC PANEL - Abnormal; Notable for the following components:   Sodium 132 (*)    Chloride 95 (*)    Glucose, Bld 121 (*)    BUN 33 (*)    Creatinine, Ser 2.11 (*)    Calcium 8.1 (*)    Total Protein 5.7 (*)    Albumin 2.7 (*)    GFR, Estimated 32 (*)    All other components within normal limits  URINALYSIS, ROUTINE W REFLEX MICROSCOPIC - Abnormal; Notable for the following components:   Glucose, UA >=500 (*)    Bacteria, UA RARE (*)    All other components within normal limits  MAGNESIUM  LACTIC ACID, PLASMA  CBC  BASIC METABOLIC PANEL  TYPE AND SCREEN    EKG EKG Interpretation  Date/Time:  Friday Nov 24 2022 16:12:25 EDT Ventricular Rate:  68 PR Interval:  184 QRS Duration: 117 QT Interval:  404 QTC Calculation: 430 R Axis:   26 Text Interpretation: Sinus rhythm Nonspecific intraventricular conduction delay No acute changes No significant change since last tracing Confirmed by Derwood Kaplan [phone])  on 11/24/2022 6:00:13 PM  Radiology DG CHEST PORT 1 VIEW  Result Date: 11/24/2022 CLINICAL DATA:  Hypotension EXAM: PORTABLE CHEST 1 VIEW COMPARISON:  02/12/2022 FINDINGS: Transverse diameter of heart is increased. There is poor inspiration. New patchy infiltrate is seen in right lower lung field. Costophrenic angles are clear. There is no pneumothorax. There is previous surgical fusion in lower cervical spine. Neurostimulator leads are noted in thoracic region. IMPRESSION: Cardiomegaly.  There are no signs of alveolar pulmonary edema. New patchy infiltrates are seen in right lower lung field suggesting atelectasis/pneumonia. Electronically Signed   By: Ernie Avena M.D.   On: 11/24/2022 18:00    Procedures Procedures    Medications Ordered in ED Medications  rosuvastatin (CRESTOR) tablet 20 mg (20 mg Oral Given 11/24/22 2256)  rOPINIRole (REQUIP) tablet 4 mg (4 mg Oral Given 11/24/22 2258)  DULoxetine (CYMBALTA) DR capsule 20 mg (20 mg Oral Given 11/24/22 2258)  gabapentin (NEURONTIN) capsule 300 mg (300 mg Oral Given 11/24/22 2255)  oxyCODONE (Oxy IR/ROXICODONE) immediate release tablet 5-10 mg (5 mg Oral Given 11/24/22 2259)  pyridOXINE (VITAMIN B6) tablet 100 mg (has no administration in time range)  traZODone (DESYREL) tablet 100 mg (100 mg Oral Given 11/24/22 2257)  tamsulosin (FLOMAX) capsule 0.4 mg (0.4 mg Oral Given 11/24/22 2257)  methocarbamol (ROBAXIN) tablet 500 mg (has no administration in time range)  acetaminophen (TYLENOL) tablet 650 mg (has no administration in time range)    Or  acetaminophen (TYLENOL) suppository 650 mg (has no administration in time range)  ondansetron (ZOFRAN) tablet 4 mg (has no administration in time range)    Or  ondansetron (ZOFRAN) injection 4 mg (has no administration in time range)  lactated ringers bolus 500 mL (500 mLs Intravenous Bolus 11/24/22 1804)  lactated ringers infusion ( Intravenous New Bag/Given  11/24/22 2047)    ED Course/  Medical Decision Making/ A&P Clinical Course as of 11/24/22 2341  Fri Nov 24, 2022  1823 EKG normal sinus rhythm, borderline prolonged QRS likely due to nonspecific intraventricular delay, no signs of acute ischemia. [JD]  1824 Chest x-ray reviewed, he has new right lower lobe opacity concerning for pneumonia.  Mild cardiomegaly without pulmonary edema.  No pneumothorax. [JD]  2037 Ortho Willia Craze [JD]    Clinical Course User Index [JD] Fulton Reek, MD                             Medical Decision Making Amount and/or Complexity of Data Reviewed Labs: ordered. Radiology: ordered.  Risk Decision regarding hospitalization.   77 year old male presented for hypotension at home when standing.  On arrival here he does have borderline low blood pressure.  He has had some fatigue but otherwise feels well.  Recently discharged for right knee replacement.  He had some low-grade fever yesterday the day before, I suspect this could be related to recent procedure.  Obtain screening lab work.  Does not appear septic at this time.  He has some mild erythema around his right knee, I suspect this is normal postoperative swelling especially given his pain is improving does not need any pain medication today.  Low concern for postoperative infection at this time.  Lab work reviewed, he has hemoglobin dropped from 12.9-9.7.  No signs of GI bleeding.  Could be postoperative.  CMP sodium 132, AKI.  Lactic acid is normal.  After fluids, his blood pressure is normalized.  He feels well.  I reviewed his chest x-ray, he has questionable right lower lobe pneumonia but no cough or fever.  Will hold on antibiotics at this time.  Concern for AKI, I suspect his AKI and hypotension may be related to hypovolemia.  I think he needs admission for monitoring and management of his AKI.  Discussed patient with the hospitalist.  They requested I reach out to orthopedic surgery given hemoglobin drop due to concern for  postoperative bleeding.  I discussed patient with Willia Craze with orthopedics, they feel this is likely related to intraoperative blood loss.  They do not recommend any imaging or urgent intervention at this time.  Patient admitted to medicine for further management.        Final Clinical Impression(s) / ED Diagnoses Final diagnoses:  None    Rx / DC Orders ED Discharge Orders     None         Fulton Reek, MD 11/24/22 0981    Derwood Kaplan, MD 11/25/22 [phone]

## 2022-11-24 NOTE — Assessment & Plan Note (Signed)
Baseline hemoglobin of 12-13.  Hemoglobin of 12.92 days prior to admission.  On day of admission, hemoglobin was found to be 9.7.  Related to recent right knee surgery.  Hemoglobin down to 8.9 this morning.  Orthopedic surgery consulted and evaluated with evidence of losing blood but without concern for active bleeding related to blood vessel injury.  Orthopedic surgery recommendation for no further management and observation. -CBC in a.m.

## 2022-11-24 NOTE — Telephone Encounter (Signed)
HHPT called stating that pt's BP has been low at 108/42, they have not been doing any exercises, just been talking for an hour and a half. Oxygen at room air, resting is between 88-92%. He is fatigued no other symptoms going on . This is new for him. I asked if she contacted his primary care doctor that manages his BP meds and she stated no, he was just discharged by Dr. Magnus Ivan yesterday and that's why she is calling us. Advised since this is new for him and we don't manage his BP meds  and this is not an orthopedic issue then he would need to go to ER for eval on low bp and low oxygen. FYI.

## 2022-11-24 NOTE — ED Triage Notes (Signed)
Pt was receiving PT post total knee replacement PT called when they arrived for low BP.  On ems arrival they report pt being orthostatic 74/40 standing 2L placed for low O2 90% on their arrival. Pt has no complaints   Currently 115/54 currently  CBG 158 98% 2L 500 NS given

## 2022-11-24 NOTE — Assessment & Plan Note (Signed)
Suspect pre-renal in setting of documented hypotension + 3gm HGB drop in past 2 days. Hold home BP meds IVF Strict intake and output Repeat BMP in AM

## 2022-11-24 NOTE — Assessment & Plan Note (Addendum)
Pt suspected to be acutely HYPOvolemic with hypotension, and 3gm HGB drop in past 2 days. Hold home BP meds including entresto and BB Hold diuretics Hold PDE 5

## 2022-11-25 DIAGNOSIS — D62 Acute posthemorrhagic anemia: Secondary | ICD-10-CM | POA: Diagnosis not present

## 2022-11-25 DIAGNOSIS — Z96651 Presence of right artificial knee joint: Secondary | ICD-10-CM | POA: Diagnosis not present

## 2022-11-25 DIAGNOSIS — N179 Acute kidney failure, unspecified: Secondary | ICD-10-CM | POA: Diagnosis not present

## 2022-11-25 DIAGNOSIS — I5042 Chronic combined systolic (congestive) and diastolic (congestive) heart failure: Secondary | ICD-10-CM | POA: Diagnosis not present

## 2022-11-25 LAB — BASIC METABOLIC PANEL
Anion gap: 10 (ref 5–15)
BUN: 30 mg/dL — ABNORMAL HIGH (ref 8–23)
CO2: 25 mmol/L (ref 22–32)
Calcium: 8 mg/dL — ABNORMAL LOW (ref 8.9–10.3)
Chloride: 98 mmol/L (ref 98–111)
Creatinine, Ser: 1.57 mg/dL — ABNORMAL HIGH (ref 0.61–1.24)
GFR, Estimated: 45 mL/min — ABNORMAL LOW (ref >60)
Glucose, Bld: 121 mg/dL — ABNORMAL HIGH (ref 70–99)
Potassium: 4.1 mmol/L (ref 3.5–5.1)
Sodium: 133 mmol/L — ABNORMAL LOW (ref 135–145)

## 2022-11-25 LAB — CBC
HCT: 27.1 % — ABNORMAL LOW (ref 39.0–52.0)
Hemoglobin: 8.9 g/dL — ABNORMAL LOW (ref 13.0–17.0)
MCH: 31.6 pg (ref 26.0–34.0)
MCHC: 32.8 g/dL (ref 30.0–36.0)
MCV: 96.1 fL (ref 80.0–100.0)
Platelets: 140 10*3/uL — ABNORMAL LOW (ref 150–400)
RBC: 2.82 MIL/uL — ABNORMAL LOW (ref 4.22–5.81)
RDW: 13.1 % (ref 11.5–15.5)
WBC: 9.7 10*3/uL (ref 4.0–10.5)
nRBC: 0 % (ref 0.0–0.2)

## 2022-11-25 MED ORDER — SODIUM CHLORIDE 0.9 % IV SOLN: INTRAVENOUS | Status: DC

## 2022-11-25 NOTE — Progress Notes (Signed)
PROGRESS NOTE    Brian Gallegos  ZOX:[id number] DOB: [birth year] DOA: 11/24/2022 PCP: Sigmund Hazel, MD   Brief Narrative: Brian Gallegos is a 77 y.o. male with a history of CAD heart failure, hypertension, hyperlipidemia.  Patient presented secondary to hypotension while work with physical therapy was found to have associated acute anemia.  No evidence of GI hemorrhage but there was evidence of loosening from right knee incision site.  Orthopedic surgery consulted with recommendation for no further intervention.   Assessment and Plan: * AKI (acute kidney injury) (HCC) Admitted to hypotension, in setting of acute anemia.  Baseline.  Creatinine of 1.  Creatinine 2.11 on admission.  Patient started on IV fluids with improvement of creatinine to 1.57 this morning. -Will hold IV fluids secondary to normotensive blood pressure and history of systolic heart -Repeat BMP in a.m.  Anemia Baseline hemoglobin of 12-13.  Hemoglobin of 12.92 days prior to admission.  On day of admission, hemoglobin was found to be 9.7.  Related to recent right knee surgery.  Hemoglobin down to 8.9 this morning.  Orthopedic surgery consulted and evaluated with evidence of losing blood but without concern for active bleeding related to blood vessel injury.  Orthopedic surgery recommendation for no further management and observation. -CBC in a.m.  Status post total right knee replacement Concern for possible hemorrhage related to recent right knee replacement.  Orthopedic surgery consulted without concern for serious hemorrhage.  Evidence of oozing from incision site.  No further recommendations at this time.  PT/OT consulted for recommendations for health PT only.  Chronic combined systolic and diastolic heart failure (HCC) Patient appears to be without evidence of volume overload.  Patient is on bisoprolol, Entresto, Jardiance, Lasix as an outpatient which were held on admission admission secondary to  hypotension.    DVT prophylaxis: SCDs Code Status:   Code Status: Full Code Family Communication: None at bedside Disposition Plan: Discharge home likely in 1 to 2 days pending stable hemoglobin and creatinine   Consultants:  Orthopedic surgery  Procedures:  None  Antimicrobials: None   Subjective: Patient reports some right knee pain. No other concerns. No bleeding noted.  Objective: BP (!) 111/53 (BP Location: Right Arm)   Pulse 65   Temp 98.5 F (36.9 C) (Oral)   Resp 19   Ht 5\' 9"  (1.753 m)   Wt 88.4 kg   SpO2 92%   BMI 28.78 kg/m   Examination:  General exam: Appears calm and comfortable Respiratory system: Clear to auscultation. Respiratory effort normal. Cardiovascular system: S1 & S2 heard, RRR. Gastrointestinal system: Abdomen is nondistended, soft and nontender. Normal bowel sounds heard. Central nervous system: Alert and oriented. No focal neurological deficits. Musculoskeletal: No edema. No calf tenderness. Right knee with clean dressing anteriorly and mild erythema noted. Right knee also with swelling. Skin: No cyanosis. No rashes Psychiatry: Judgement and insight appear normal. Mood & affect appropriate.    Data Reviewed: I have personally reviewed following labs and imaging studies  CBC Lab Results  Component Value Date   WBC 9.7 11/25/2022   RBC 2.82 (L) 11/25/2022   HGB 8.9 (L) 11/25/2022   HCT 27.1 (L) 11/25/2022   MCV 96.1 11/25/2022   MCH 31.6 11/25/2022   PLT 140 (L) 11/25/2022   MCHC 32.8 11/25/2022   RDW 13.1 11/25/2022   LYMPHSABS 1.2 11/24/2022   MONOABS 1.3 (H) 11/24/2022   EOSABS 0.2 11/24/2022   BASOSABS 0.0 11/24/2022     Last metabolic panel Lab  Results  Component Value Date   NA 133 (L) 11/25/2022   K 4.1 11/25/2022   CL 98 11/25/2022   CO2 25 11/25/2022   BUN 30 (H) 11/25/2022   CREATININE 1.57 (H) 11/25/2022   GLUCOSE 121 (H) 11/25/2022   GFRNONAA 45 (L) 11/25/2022   GFRAA 102 01/06/2020   CALCIUM 8.0 (L)  11/25/2022   PROT 5.7 (L) 11/24/2022   ALBUMIN 2.7 (L) 11/24/2022   BILITOT 0.6 11/24/2022   ALKPHOS 52 11/24/2022   AST 25 11/24/2022   ALT 14 11/24/2022   ANIONGAP 10 11/25/2022    GFR: Estimated Creatinine Clearance: 44 mL/min (A) (by C-G formula based on SCr of 1.57 mg/dL (H)).  No results found for this or any previous visit (from the past 240 hour(s)).    Radiology Studies: DG CHEST PORT 1 VIEW  Result Date: 11/24/2022 CLINICAL DATA:  Hypotension EXAM: PORTABLE CHEST 1 VIEW COMPARISON:  02/12/2022 FINDINGS: Transverse diameter of heart is increased. There is poor inspiration. New patchy infiltrate is seen in right lower lung field. Costophrenic angles are clear. There is no pneumothorax. There is previous surgical fusion in lower cervical spine. Neurostimulator leads are noted in thoracic region. IMPRESSION: Cardiomegaly.  There are no signs of alveolar pulmonary edema. New patchy infiltrates are seen in right lower lung field suggesting atelectasis/pneumonia. Electronically Signed   By: Ernie Avena M.D.   On: 11/24/2022 18:00      LOS: 0 days    Jacquelin Hawking, MD [facility] 11/25/2022, 10:05 AM   If 7PM-7AM, please contact night-coverage [email or web address]

## 2022-11-25 NOTE — Hospital Course (Signed)
Brian Gallegos is a 77 y.o. male with a history of CAD heart failure, hypertension, hyperlipidemia.  Patient presented secondary to hypotension while work with physical therapy was found to have associated acute anemia.  No evidence of GI hemorrhage but there was evidence of loosening from right knee incision site.  Orthopedic surgery consulted with recommendation for no further intervention.

## 2022-11-25 NOTE — Evaluation (Signed)
Occupational Therapy Evaluation Patient Details Name: Brian Gallegos MRN: [id number] DOB: January 11, 1946 Today's Date: 11/25/2022   History of Present Illness Patient is a 77 y.o. male who underwent right TKA with Dr. Magnus Ivan on 11/21/2022 and is admitted to medicine for AKI and hypotension.  PMH of hypertension, CAD, CHF   Clinical Impression   Pt currently min assist to min guard for transfers and LB selfcare sit to stand.  BP stable with readings of 110/52 in supine with HOB elevated 39 degrees, 89/56 sitting EOB, 102/63 standing, and 113/48 post session in the recliner.  Oxygen sats at 95% on room air with HR in the mid 70s.  He is living at his son's house in a lower level apartment.  Feel he will benefit from acute care OT at this time for continued endurance building and progression toward more independence with basic selfcare tasks. Anticipate no post acute OT needs or DME.       Recommendations for follow up therapy are one component of a multi-disciplinary discharge planning process, led by the attending physician.  Recommendations may be updated based on patient status, additional functional criteria and insurance authorization.   Assistance Recommended at Discharge PRN  Patient can return home with the following A little help with bathing/dressing/bathroom;Assistance with cooking/housework;Assist for transportation;Help with stairs or ramp for entrance    Functional Status Assessment  Patient has had a recent decline in their functional status and demonstrates the ability to make significant improvements in function in a reasonable and predictable amount of time.  Equipment Recommendations  None recommended by OT       Precautions / Restrictions Precautions Precautions: Knee Restrictions Weight Bearing Restrictions: No RLE Weight Bearing: Weight bearing as tolerated      Mobility Bed Mobility Overal bed mobility: Needs Assistance Bed Mobility: Supine to Sit     Supine  to sit: Min assist     General bed mobility comments: Min assist needed to advance the RLE to the EOB    Transfers Overall transfer level: Needs assistance Equipment used: Rolling walker (2 wheels) Transfers: Sit to/from Stand, Bed to chair/wheelchair/BSC Sit to Stand: Min guard     Step pivot transfers: Min guard            Balance Overall balance assessment: Needs assistance   Sitting balance-Leahy Scale: Good     Standing balance support: During functional activity Standing balance-Leahy Scale: Fair Standing balance comment: Pt can stand for grooming tasks without UE support but needs assist from RW during mobility.                           ADL either performed or assessed with clinical judgement   ADL Overall ADL's : Needs assistance/impaired Eating/Feeding: Independent;Sitting   Grooming: Wash/dry hands;Wash/dry face;Oral care;Supervision/safety;Standing   Upper Body Bathing: Set up;Sitting Upper Body Bathing Details (indicate cue type and reason): simulated Lower Body Bathing: Minimal assistance;Sit to/from stand Lower Body Bathing Details (indicate cue type and reason): simulated Upper Body Dressing : Set up;Sitting   Lower Body Dressing: Moderate assistance;Sit to/from stand Lower Body Dressing Details (indicate cue type and reason): simulated without use of AE Toilet Transfer: Ambulation;Min guard;BSC/3in1 Statistician Details (indicate cue type and reason): simulated Toileting- Clothing Manipulation and Hygiene: Min guard;Sit to/from stand Toileting - Clothing Manipulation Details (indicate cue type and reason): simulated     Functional mobility during ADLs: Minimal assistance;Min guard;Rolling walker (2 wheels) General ADL Comments: Pt with  increased knee swelling in the right knee.  Ice provided at end of session.  Pt with difficulty reaching the RLE for doffing and donning sock.  Per his report he has had to have assist with this or at  times he will use a reacher he has at home.  BP in supine with HOB elevated 39 degrees 110/52, sitting 89/56, standing 102/63, and at end of session in recliner 113/48  HR stable in the 70s with O2 at 95% on room air.     Vision Baseline Vision/History: 1 Wears glasses Ability to See in Adequate Light: 0 Adequate Patient Visual Report: No change from baseline Vision Assessment?: No apparent visual deficits     Perception  WFL   Praxis  [facility]    Pertinent Vitals/Pain Pain Assessment Pain Assessment: 0-10 Pain Score: 5  Pain Location: R knee Pain Descriptors / Indicators: Sore Pain Intervention(s): Limited activity within patient's tolerance, Monitored during session, Repositioned        Extremity/Trunk Assessment Upper Extremity Assessment Upper Extremity Assessment: Overall WFL for tasks assessed   Lower Extremity Assessment Lower Extremity Assessment: Defer to PT evaluation   Cervical / Trunk Assessment Cervical / Trunk Assessment: Normal   Communication Communication Communication: (P) No difficulties   Cognition Arousal/Alertness: Awake/alert Behavior During Therapy: WFL for tasks assessed/performed Overall Cognitive Status: Within Functional Limits for tasks assessed                                                  Home Living Family/patient expects to be discharged to:: (P) Private residence Living Arrangements: (P) Children Available Help at Discharge: (P) Family Type of Home: (P) Apartment Home Access: (P) Stairs to enter Entrance Stairs-Number of Steps: (P) 1 Entrance Stairs-Rails: (P) None Home Layout: (P) One level     Bathroom Shower/Tub: (P) Walk-in shower     Bathroom Accessibility: (P) Yes   Home Equipment: (P) Rolling Walker (2 wheels);Rollator (4 wheels);Shower seat - built in;Toilet riser          Prior Functioning/Environment Prior Level of Function : (P) Independent/Modified Independent                         OT Problem List: Decreased strength;Decreased knowledge of use of DME or AE;Decreased activity tolerance;Impaired balance (sitting and/or standing);Pain      OT Treatment/Interventions: Self-care/ADL training;Patient/family education;Balance training;Therapeutic activities;DME and/or AE instruction;Cognitive remediation/compensation;Therapeutic exercise    OT Goals(Current goals can be found in the care plan section) Acute Rehab OT Goals Patient Stated Goal: Pt wants to get back home and feel better. OT Goal Formulation: With patient Time For Goal Achievement: 12/09/22 Potential to Achieve Goals: Good  OT Frequency: Min 2X/week       AM-PAC OT "6 Clicks" Daily Activity     Outcome Measure Help from another person eating meals?: None Help from another person taking care of personal grooming?: A Little Help from another person toileting, which includes using toliet, bedpan, or urinal?: A Little Help from another person bathing (including washing, rinsing, drying)?: A Little Help from another person to put on and taking off regular upper body clothing?: A Little Help from another person to put on and taking off regular lower body clothing?: A Lot 6 Click Score: 18   End of Session Equipment Utilized During Treatment: Rolling walker (  2 wheels) Nurse Communication: Mobility status  Activity Tolerance: Patient limited by fatigue Patient left: in chair;with call bell/phone within reach  OT Visit Diagnosis: Unsteadiness on feet (R26.81);Other abnormalities of gait and mobility (R26.89);Muscle weakness (generalized) (M62.81);Pain Pain - Right/Left: Left Pain - part of body: Knee                Time: 4098-1191 OT Time Calculation (min): 50 min Charges:  OT Evaluation $OT Eval Moderate Complexity: 1 Mod OT Treatments $Self Care/Home Management : 23-37 mins Perrin Maltese, OTR/L Acute Rehabilitation Services  Office [phone] 11/25/2022

## 2022-11-25 NOTE — Progress Notes (Signed)
New Admission Note:    Arrival Method: ED stretcher Mental Orientation: a/o x4 Telemetry: n/a Assessment:completed Skin: intact IV: left AC Pain: 5 Tubes: n/a Safety Measures: non-skid socks Admission: completed Orientation: Patient has been oriented to the room, unit and staff.  Family: sister and daughter Belongings: upper dentures, watch, cell phone, glasses  Orders have been reviewed and implemented. Will continue to monitor the patient. Call light has been placed within reach and bed alarm has been activated.   Fabian Sharp BSN, RN-BC Phone number: [phone]

## 2022-11-25 NOTE — Progress Notes (Signed)
Patient ID: Brian Gallegos, male   DOB: [birth year], 77 y.o.   MRN: [id number] I just came by to check on the patient since he was readmitted with dehydration and AKI.  There was concern about his drop in hemoglobin.  It is 8.9 today.  His creatinine is down to 1.57 from 2.11 yesterday.  His vital signs are entirely normal.  He is not tachycardic and he is not hypotensive.  I examined the right operative knee and the swelling is appropriate and normal status post a right total knee arthroplasty.  He has normal motor and sensory exam in his right foot and his right foot as well perfused and his calf is soft.  He did get up to brush his teeth and he has normal urine output.  He does feel fatigued.  From my standpoint, he does not need any further imaging studies of his right knee and I am not concerned about active bleeding in terms of any type of blood vessel injury.  There is certainly some oozing of blood likely from the bone from knee replacement surgery but that is normal.  He does not have a significant drop in his hemoglobin from yesterday to today and some of that is delusional.  Hopefully continue to mobilize and is numbers continue to improve so he could potentially be discharged home in the next day or so.

## 2022-11-25 NOTE — Progress Notes (Signed)
Orthopedic Surgery Progress Note   Assessment: Patient is a 77 y.o. male who underwent right TKA with Dr. Magnus Ivan on 11/21/2022 and is admitted to medicine for AKI   Plan: -No acute operative intervention -No further imaging needed at this time -Recommend daily CBCs to trend -Transfuse for hemoglobin of 7 or less -Weight bearing status: as tolerated -PT evaluate and treat -Pain control -Dispo: per primary  ___________________________________________________________________________  Subjective: Patient presented to the hospital last night with hypotension and was found to have an AKI. He was admitted to the medicine service. He states his knee is doing well. He said he was not taking many pain medications. Has not noticed any drainage from the knee. Denies paresthesias and numbness. Has not had any lightheadedness, dizziness, nausea, fevers/chills, fatigue, headaches, weakness.    Physical Exam:  General: no acute distress, appears stated age Neurologic: alert, answering questions appropriately, following commands Respiratory: unlabored breathing on room air, symmetric chest rise Psychiatric: appropriate affect, normal cadence to speech  MSK:   -Right lower extremity  Aquacel dressing over the knee c/d/i Able to tolerate knee range of motion from 0-50 without any significant pain Expected swelling for POD#4 around the knee seen EHL/TA/GSC intact Plantarflexes and dorsiflexes toes Sensation intact to light touch in sural, saphenous, tibial, deep peroneal, and superficial peroneal nerve distributions Foot warm and well perfused, palpable DP pulse   Patient name: Brian Gallegos Patient MRN: [id number] Date: 11/25/22

## 2022-11-25 NOTE — Plan of Care (Signed)

## 2022-11-25 NOTE — Evaluation (Signed)
Physical Therapy Evaluation  Patient Details Name: Brian Gallegos MRN: [id number] DOB: Mar 05, 1946 Today's Date: 11/25/2022  History of Present Illness  Patient is a 77 y.o. male who underwent right TKA with Dr. Magnus Ivan on 11/21/2022, discharged 5/23 and is admitted to medicine 5/24 for AKI and hypotension.  PMH of hypertension, CAD, CHF   Clinical Impression  Pt admitted with above diagnosis. Pt currently with functional limitations due to the deficits listed below (see PT Problem List). At the time of PT eval pt was fatigued but willing to work with therapy. Overall good rehab effort with therapeutic exercise and gait training. Pt fatigued quickly with gait and reports mild lightheadedness. Upon return to the room BP 137/56, HR 72 bpm, and O2 98% on RA. Pt reports he was sent back to the hospital at home health PT's first visit so he has not had much PT/activity since discharge. Acutely, pt will benefit from acute skilled PT to increase their independence and safety with mobility to allow discharge.          Recommendations for follow up therapy are one component of a multi-disciplinary discharge planning process, led by the attending physician.  Recommendations may be updated based on patient status, additional functional criteria and insurance authorization.  Follow Up Recommendations       Assistance Recommended at Discharge PRN  Patient can return home with the following  Help with stairs or ramp for entrance;Assist for transportation    Equipment Recommendations None recommended by PT  Recommendations for Other Services       Functional Status Assessment Patient has had a recent decline in their functional status and demonstrates the ability to make significant improvements in function in a reasonable and predictable amount of time.     Precautions / Restrictions Precautions Precautions: Knee Precaution Comments: Pt was educated that NO pillow/roll/ice pack is to be placed  under the knee, only the ankle. Restrictions Weight Bearing Restrictions: No RLE Weight Bearing: Weight bearing as tolerated      Mobility  Bed Mobility Overal bed mobility: Needs Assistance Bed Mobility: Supine to Sit     Supine to sit: Supervision Sit to supine: Supervision   General bed mobility comments: Increased time and effort but pt was able to transition to/from EOB without assistance.    Transfers Overall transfer level: Needs assistance Equipment used: Rolling walker (2 wheels) Transfers: Sit to/from Stand Sit to Stand: Min guard           General transfer comment: Pt demonstrated proper hand placement on seated surface for safety when powering up to full stand, however requires cues to reach back for bed to prepare for stand>sit.    Ambulation/Gait Ambulation/Gait assistance: Min guard Gait Distance (Feet): 100 Feet Assistive device: Rolling walker (2 wheels) Gait Pattern/deviations: Step-to pattern, Decreased step length - right, Decreased stance time - right, Decreased dorsiflexion - right, Knee flexed in stance - right Gait velocity: decreased Gait velocity interpretation: <1.31 ft/sec, indicative of household ambulator   General Gait Details: VC's for increased heel strike, step-through pattern, and improved posture throughout. Pt taking standing rest breaks and reports fatigue, mild lightheadedness. Distance limited by symptoms.  Stairs            Wheelchair Mobility    Modified Rankin (Stroke Patients Only)       Balance Overall balance assessment: Needs assistance   Sitting balance-Leahy Scale: Good     Standing balance support: During functional activity, Reliant on assistive device for balance  Standing balance-Leahy Scale: Poor                               Pertinent Vitals/Pain Pain Assessment Pain Assessment: Faces Faces Pain Scale: Hurts little more Pain Location: R knee Pain Descriptors / Indicators: Sore,  Operative site guarding Pain Intervention(s): Limited activity within patient's tolerance, Monitored during session, Repositioned    Home Living Family/patient expects to be discharged to:: Private residence Living Arrangements: Children Available Help at Discharge: Family Type of Home: Apartment Home Access: Stairs to enter Entrance Stairs-Rails: None Entrance Stairs-Number of Steps: 1   Home Layout: One level Home Equipment: Agricultural consultant (2 wheels);Rollator (4 wheels);BSC/3in1;Shower seat - built in      Prior Function Prior Level of Function : Independent/Modified Independent             Mobility Comments: Utilizing RW at home       Hand Dominance        Extremity/Trunk Assessment   Upper Extremity Assessment Upper Extremity Assessment: Overall WFL for tasks assessed    Lower Extremity Assessment Lower Extremity Assessment: RLE deficits/detail RLE Deficits / Details: Decreased strength and AROM consistent with recent TKA. R knee appears red around dressing - incision not visualized.    Cervical / Trunk Assessment Cervical / Trunk Assessment: Normal  Communication   Communication: No difficulties  Cognition Arousal/Alertness: Awake/alert Behavior During Therapy: WFL for tasks assessed/performed Overall Cognitive Status: Within Functional Limits for tasks assessed                                          General Comments      Exercises Total Joint Exercises Ankle Circles/Pumps: AROM, Both, 10 reps Quad Sets: AROM, Right, 10 reps Short Arc Quad: AAROM, Right, 10 reps Heel Slides: AAROM, Right, 10 reps Hip ABduction/ADduction: AAROM, Right, 10 reps Straight Leg Raises: AAROM, Right, 10 reps Knee Flexion:  (with LLE applying overpressure into incr flexion for AAROM)   Assessment/Plan    PT Assessment Patient needs continued PT services  PT Problem List Decreased strength;Decreased range of motion;Decreased activity  tolerance;Decreased balance;Decreased mobility;Decreased knowledge of use of DME;Decreased knowledge of precautions;Pain       PT Treatment Interventions DME instruction;Gait training;Stair training;Functional mobility training;Therapeutic activities;Therapeutic exercise;Balance training;Patient/family education    PT Goals (Current goals can be found in the Care Plan section)  Acute Rehab PT Goals Patient Stated Goal: back to golf PT Goal Formulation: With patient Time For Goal Achievement: 12/02/22 Potential to Achieve Goals: Good    Frequency 7X/week     Co-evaluation               AM-PAC PT "6 Clicks" Mobility  Outcome Measure Help needed turning from your back to your side while in a flat bed without using bedrails?: None Help needed moving from lying on your back to sitting on the side of a flat bed without using bedrails?: A Little Help needed moving to and from a bed to a chair (including a wheelchair)?: A Little Help needed standing up from a chair using your arms (e.g., wheelchair or bedside chair)?: A Little Help needed to walk in hospital room?: A Little Help needed climbing 3-5 steps with a railing? : A Little 6 Click Score: 19    End of Session Equipment Utilized During Treatment: Gait belt Activity  Tolerance: Patient tolerated treatment well Patient left: in bed;with call bell/phone within reach;with family/visitor present (Ice reapplied) Nurse Communication: Mobility status PT Visit Diagnosis: Other abnormalities of gait and mobility (R26.89);Pain Pain - Right/Left: Right Pain - part of body: Knee    Time: 8119-1478 PT Time Calculation (min) (ACUTE ONLY): 37 min   Charges:     PT Treatments $Gait Training: 8-22 mins $Therapeutic Exercise: 8-22 mins        Conni Slipper, PT, DPT Acute Rehabilitation Services Secure Chat Preferred Office: [phone]   Marylynn Pearson 11/25/2022, 1:03 PM

## 2022-11-26 DIAGNOSIS — I5042 Chronic combined systolic (congestive) and diastolic (congestive) heart failure: Secondary | ICD-10-CM | POA: Diagnosis not present

## 2022-11-26 DIAGNOSIS — D62 Acute posthemorrhagic anemia: Secondary | ICD-10-CM | POA: Diagnosis not present

## 2022-11-26 DIAGNOSIS — Z96651 Presence of right artificial knee joint: Secondary | ICD-10-CM | POA: Diagnosis not present

## 2022-11-26 DIAGNOSIS — N179 Acute kidney failure, unspecified: Secondary | ICD-10-CM | POA: Diagnosis not present

## 2022-11-26 LAB — BASIC METABOLIC PANEL
Anion gap: 8 (ref 5–15)
BUN: 20 mg/dL (ref 8–23)
CO2: 27 mmol/L (ref 22–32)
Calcium: 8 mg/dL — ABNORMAL LOW (ref 8.9–10.3)
Chloride: 98 mmol/L (ref 98–111)
Creatinine, Ser: 0.94 mg/dL (ref 0.61–1.24)
GFR, Estimated: >60 mL/min (ref >60)
Glucose, Bld: 111 mg/dL — ABNORMAL HIGH (ref 70–99)
Potassium: 3.7 mmol/L (ref 3.5–5.1)
Sodium: 133 mmol/L — ABNORMAL LOW (ref 135–145)

## 2022-11-26 LAB — CBC
HCT: 27.5 % — ABNORMAL LOW (ref 39.0–52.0)
Hemoglobin: 9.2 g/dL — ABNORMAL LOW (ref 13.0–17.0)
MCH: 31.7 pg (ref 26.0–34.0)
MCHC: 33.5 g/dL (ref 30.0–36.0)
MCV: 94.8 fL (ref 80.0–100.0)
Platelets: 180 10*3/uL (ref 150–400)
RBC: 2.9 MIL/uL — ABNORMAL LOW (ref 4.22–5.81)
RDW: 12.7 % (ref 11.5–15.5)
WBC: 6.9 10*3/uL (ref 4.0–10.5)
nRBC: 0 % (ref 0.0–0.2)

## 2022-11-26 NOTE — Plan of Care (Signed)
  Problem: Education: Goal: Individualized Educational Video(s) Outcome: Progressing   Problem: Activity: Goal: Ability to avoid complications of mobility impairment will improve Outcome: Progressing Goal: Range of joint motion will improve Outcome: Progressing   Problem: Clinical Measurements: Goal: Postoperative complications will be avoided or minimized Outcome: Progressing   Problem: Pain Management: Goal: Pain level will decrease with appropriate interventions Outcome: Progressing   Problem: Skin Integrity: Goal: Will show signs of wound healing Outcome: Progressing   

## 2022-11-26 NOTE — Discharge Summary (Signed)
Physician Discharge Summary   Patient: Brian Gallegos MRN: [id number] DOB: 05/31/1946  Admit date:     11/24/2022  Discharge date: 11/26/22  Discharge Physician: Jacquelin Hawking, MD   PCP: Sigmund Hazel, MD   Recommendations at discharge:  Hospital follow-up with PCP Orthopedic surgery follow-up for right knee postop care  Discharge Diagnoses: Active Problems:   Acute postoperative anemia due to expected blood loss   Chronic combined systolic and diastolic heart failure (HCC)   Status post total right knee replacement  Principal Problem (Resolved):   AKI (acute kidney injury) [facility])  Hospital Course: Brian Gallegos is a 77 y.o. male with a history of CAD heart failure, hypertension, hyperlipidemia.  Patient presented secondary to hypotension while work with physical therapy was found to have associated acute anemia.  No evidence of GI hemorrhage but there was evidence of loosening from right knee incision site.  Orthopedic surgery consulted with recommendation for no further intervention.  Hemoglobin stable and AKI resolved prior to discharge.  Assessment and Plan: * AKI (acute kidney injury) (HCC)-resolved as of 11/26/2022 Admitted to hypotension, in setting of acute anemia.  Baseline.  Creatinine of 1.  Creatinine 2.11 on admission.  Patient started on IV fluids with improvement of creatinine to 0.94 prior to discharge.  Acute postoperative anemia due to expected blood loss Baseline hemoglobin of 12-13.  Hemoglobin of 12.9 two days prior to admission.  On day of admission, hemoglobin was found to be 9.7.  Related to recent right knee surgery.  Hemoglobin down to 8.9 this morning.  Orthopedic surgery consulted and evaluated with evidence of losing blood but without concern for active bleeding related to blood vessel injury.  Orthopedic surgery recommendation for no further management and observation.  Hemoglobin stable prior to discharge with hemoglobin 9.2 on discharge date.  Status  post total right knee replacement Concern for possible hemorrhage related to recent right knee replacement.  Orthopedic surgery consulted without concern for serious hemorrhage.  Evidence of oozing from incision site.  No further recommendations at this time.  PT/OT consulted for recommendations for health PT.  Chronic combined systolic and diastolic heart failure (HCC) Patient appears to be without evidence of volume overload.  Patient is on bisoprolol, Entresto, Jardiance, Lasix as an outpatient which were held on admission admission secondary to hypotension.  Resume regimen on discharge.    Consultants: Orthopedic surgery Procedures performed: None Disposition: Home health Diet recommendation: Cardiac diet  DISCHARGE MEDICATION: Allergies as of 11/26/2022       Reactions   Dulcolax [bisacodyl] Other (See Comments)   Severe stomach cramps   Codeine Itching   Low amount is tolerated   Proscar [finasteride] Hives, Rash        Medication List     TAKE these medications    Advil Dual Action 125-250 MG Tabs Generic drug: Ibuprofen-Acetaminophen Take 2 tablets by mouth every 8 (eight) hours as needed (pain).   aspirin 81 MG chewable tablet Chew 1 tablet (81 mg total) by mouth 2 (two) times daily.   bisoprolol 5 MG tablet Commonly known as: ZEBETA Take 0.5 tablets (2.5 mg total) by mouth daily.   Cholecalciferol 25 MCG (1000 UT) tablet Take 1,000 Units by mouth in the morning.   diclofenac Sodium 1 % Gel Commonly known as: VOLTAREN Apply 1 Application topically as needed (pain).   DULoxetine 20 MG capsule Commonly known as: CYMBALTA TAKE 1 CAPSULE(20 MG) BY MOUTH DAILY What changed: See the new instructions.   empagliflozin 10  MG Tabs tablet Commonly known as: Jardiance TAKE 1 TABLET(10 MG) BY MOUTH EVERY MORNING   Entresto 49-51 MG Generic drug: sacubitril-valsartan Take 1 tablet by mouth 2 (two) times daily.   furosemide 40 MG tablet Commonly known as:  LASIX Take 40 mg by mouth daily.   gabapentin 300 MG capsule Commonly known as: NEURONTIN TAKE 1 CAPSULE(300 MG) BY MOUTH AT BEDTIME   lubiprostone 8 MCG capsule Commonly known as: AMITIZA Take 8 mcg by mouth 2 (two) times daily with a meal.   methocarbamol 500 MG tablet Commonly known as: ROBAXIN Take 1 tablet (500 mg total) by mouth every 6 (six) hours as needed for muscle spasms.   oxyCODONE 5 MG immediate release tablet Commonly known as: Oxy IR/ROXICODONE Take 1-2 tablets (5-10 mg total) by mouth every 4 (four) hours as needed for moderate pain (pain score 4-6).   oxymetazoline 0.05 % nasal spray Commonly known as: AFRIN Place 1 spray into both nostrils 2 (two) times daily as needed for congestion.   pyridoxine 100 MG tablet Commonly known as: B-6 Take 100 mg by mouth in the morning.   rOPINIRole 2 MG tablet Commonly known as: REQUIP Take 4 mg by mouth See admin instructions. Take 4 mg in the afternoon and 4 mg at bedtime, may take a third 4 mg dose as needed for restless legs   rosuvastatin 40 MG tablet Commonly known as: CRESTOR Take 20 mg by mouth at bedtime.   spironolactone 25 MG tablet Commonly known as: ALDACTONE Take 1 tablet (25 mg total) by mouth daily.   tadalafil 20 MG tablet Commonly known as: CIALIS Take 1 tablet (20 mg total) by mouth daily as needed for erectile dysfunction. What changed: Another medication with the same name was changed. Make sure you understand how and when to take each.   tadalafil 5 MG tablet Commonly known as: CIALIS Take 1 tablet (5 mg total) by mouth daily. What changed: when to take this   tamsulosin 0.4 MG Caps capsule Commonly known as: FLOMAX TAKE 1 CAPSULE(0.4 MG) BY MOUTH IN THE MORNING AND AT BEDTIME What changed: See the new instructions.   traZODone 100 MG tablet Commonly known as: DESYREL Take 100 mg by mouth at bedtime.   vitamin C 250 MG tablet Commonly known as: ASCORBIC ACID Take 250 mg by mouth  daily.        Follow-up Information     [facility] up.   Specialty: Home Health Services Why: SOmeone from [facility] will contact you to arrange start date to resume your Home Therapy. Contact information: [address] [place] [address] [phone]         Sigmund Hazel, MD. Schedule an appointment as soon as possible for a visit in 1 week(s).   Specialty: Family Medicine Why: For hospital follow-up Contact information: [address] [place] [address] [phone]                Discharge Exam: BP (!) 157/66 (BP Location: Right Arm)   Pulse 81   Temp 98.4 F (36.9 C) (Oral)   Resp 19   Ht 5\' 9"  (1.753 m)   Wt 88.4 kg   SpO2 94%   BMI 28.78 kg/m   General exam: Appears calm and comfortable Respiratory system: Clear to auscultation. Respiratory effort normal. Cardiovascular system: S1 & S2 heard, RRR. Gastrointestinal system: Abdomen is nondistended, soft and nontender. No organomegaly or masses felt. Normal bowel sounds  heard. Central nervous system: Alert and oriented. No focal neurological deficits. Musculoskeletal: Right knee and lower leg are swollen Psychiatry: Judgement and insight appear normal. Mood & affect appropriate.   Condition at discharge: stable  The results of significant diagnostics from this hospitalization (including imaging, microbiology, ancillary and laboratory) are listed below for reference.   Imaging Studies: DG CHEST PORT 1 VIEW  Result Date: 11/24/2022 CLINICAL DATA:  Hypotension EXAM: PORTABLE CHEST 1 VIEW COMPARISON:  02/12/2022 FINDINGS: Transverse diameter of heart is increased. There is poor inspiration. New patchy infiltrate is seen in right lower lung field. Costophrenic angles are clear. There is no pneumothorax. There is previous surgical fusion in lower cervical spine. Neurostimulator leads are noted in thoracic region. IMPRESSION: Cardiomegaly.  There are no signs  of alveolar pulmonary edema. New patchy infiltrates are seen in right lower lung field suggesting atelectasis/pneumonia. Electronically Signed   By: Ernie Avena M.D.   On: 11/24/2022 18:00   DG Knee Right Port  Result Date: 11/21/2022 CLINICAL DATA:  Postoperative. EXAM: PORTABLE RIGHT KNEE - 1-2 VIEW COMPARISON:  Right knee radiographs 08/24/2021 FINDINGS: Interval total right knee arthroplasty. No perihardware lucency is seen to indicate hardware failure or loosening. Expected postoperative changes including moderate joint fluid, intra-articular air, and anterior subcutaneous air. Anterior surgical skin staples. No acute fracture or dislocation. IMPRESSION: Interval total right knee arthroplasty without evidence of hardware failure. Electronically Signed   By: Neita Garnet M.D.   On: 11/21/2022 17:21    Microbiology: Results for orders placed or performed during the hospital encounter of 11/15/22  Surgical pcr screen     Status: Abnormal   Collection Time: 11/15/22  8:24 AM   Specimen: Nasal Mucosa; Nasal Swab  Result Value Ref Range Status   MRSA, PCR NEGATIVE NEGATIVE Final   Staphylococcus aureus POSITIVE (A) NEGATIVE Final    Comment: (NOTE) The Xpert SA Assay (FDA approved for NASAL specimens in patients 48 years of age and older), is one component of a comprehensive surveillance program. It is not intended to diagnose infection nor to guide or monitor treatment. Performed at [facility], [address], [place], [place] [address]     Labs: CBC: Recent Labs  Lab 11/22/22 [id number] 11/24/22 1725 11/25/22 0218 11/26/22 0353  WBC 12.5* 9.1 9.7 6.9  NEUTROABS  --  6.4  --   --   HGB 12.9* 9.7* 8.9* 9.2*  HCT 39.8 29.9* 27.1* 27.5*  MCV 96.8 98.7 96.1 94.8  PLT 188 140* 140* 180   Basic Metabolic Panel: Recent Labs  Lab 11/22/22 0337 11/24/22 1725 11/25/22 0218 11/26/22 0353  NA 135 132* 133* 133*  K 4.3 3.6 4.1 3.7  CL 102 95* 98 98  CO2 25 29 25 27    GLUCOSE 139* 121* 121* 111*  BUN 16 33* 30* 20  CREATININE 1.05 2.11* 1.57* 0.94  CALCIUM 8.8* 8.1* 8.0* 8.0*  MG  --  2.3  --   --    Liver Function Tests: Recent Labs  Lab 11/24/22 1725  AST 25  ALT 14  ALKPHOS 52  BILITOT 0.6  PROT 5.7*  ALBUMIN 2.7*    Discharge time spent: 35 minutes.  Signed: Jacquelin Hawking, MD [facility] 11/26/2022

## 2022-11-26 NOTE — Progress Notes (Signed)
Orthopedic Surgery Progress Note   Assessment: Patient is a 77 y.o. male who underwent right TKA with Dr. Magnus Ivan on 11/21/2022 and is admitted to medicine for AKI   Plan: -No acute operative intervention -No further imaging needed at this time -Hemoglobin has stabilized, can discontinue monitoring -Weight bearing status: as tolerated -PT evaluate and treat -Pain control -Dispo: per primary -Already has follow up arranged with Dr. Magnus Ivan ___________________________________________________________________________  Subjective: No acute events overnight. Pain adequately controlled. Has only been taking tylenol for pain relief. Feels he is able to move his knee better this morning when compared to yesterday. Denies paresthesias and numbness.    Physical Exam:  General: no acute distress, appears stated age Neurologic: alert, answering questions appropriately, following commands Respiratory: unlabored breathing on room air, symmetric chest rise Psychiatric: appropriate affect, normal cadence to speech  MSK:   -Right lower extremity  Aquacel dressing over the knee c/d/i Expected swelling for POD#5 around the knee seen EHL/TA/GSC intact Plantarflexes and dorsiflexes toes Sensation intact to light touch in sural, saphenous, tibial, deep peroneal, and superficial peroneal nerve distributions Foot warm and well perfused, palpable DP pulse   Patient name: Brian Gallegos Patient MRN: [id number] Date: 11/26/22

## 2022-11-26 NOTE — TOC Transition Note (Signed)
Transition of Care [facility]) - CM/SW Discharge Note   Patient Details  Name: Brian Gallegos MRN: [id number] Date of Birth: Feb 23, 1946  Transition of Care [facility]) CM/SW Contact:  Gordy Clement, RN Phone Number: 11/26/2022, 12:23 PM   Clinical Narrative:    Patient will DC to home with [facility]  PT  No DME has been recommended. Family to transport  No additional TOC needs       Barriers to Discharge: Continued Medical Work up   Patient Goals and CMS Choice      Discharge Placement                         Discharge Plan and Services Additional resources added to the After Visit Summary for     Discharge Planning Services: CM Consult Post Acute Care Choice: Resumption of Svcs/PTA Provider            DME Agency: [facility], [place]       HH Arranged: PT [facility] Agency: [facility] Date [facility] Agency Contacted: 11/25/22 Time HH Agency Contacted: 1532 Representative spoke with at [facility] Agency: Laurelyn Sickle (updated her with pts adm)  Social Determinants of Health (SDOH) Interventions SDOH Screenings   Tobacco Use: Low Risk  (11/24/2022)     Readmission Risk Interventions     No data to display

## 2022-11-26 NOTE — Progress Notes (Signed)
DISCHARGE NOTE HOME Brian Gallegos to be discharged Home per MD order. Discussed prescriptions and follow up appointments with the patient. Prescriptions given to patient; medication list explained in detail. Patient verbalized understanding.  Skin clean, dry and intact without evidence of skin break down, no evidence of skin tears noted. IV catheter discontinued intact. Site without signs and symptoms of complications. Dressing and pressure applied. Pt denies pain at the site currently. No complaints noted.  Patient free of lines, drains, and wounds.   An After Visit Summary (AVS) was printed and given to the patient. Patient escorted via wheelchair, and discharged home via private auto. Pt. Picked up by Alberteen Sam, daughter.  Margarita Grizzle, RN

## 2022-11-26 NOTE — Discharge Instructions (Signed)
Brian Gallegos,  You were in the hospital with dehydration and low hemoglobin. Please continue to stay hydrated within the recommendations of your cardiologist. Please continue your medications, however if you have decreased fluid intake, you may need to hold your diuretics (Lasix and Spironolactone). Please follow-up with your PCP and surgeon.

## 2022-11-28 ENCOUNTER — Telehealth: Payer: Self-pay | Admitting: *Deleted

## 2022-11-28 DIAGNOSIS — G8929 Other chronic pain: Secondary | ICD-10-CM | POA: Diagnosis not present

## 2022-11-28 DIAGNOSIS — M545 Low back pain, unspecified: Secondary | ICD-10-CM | POA: Diagnosis not present

## 2022-11-28 DIAGNOSIS — I110 Hypertensive heart disease with heart failure: Secondary | ICD-10-CM | POA: Diagnosis not present

## 2022-11-28 DIAGNOSIS — Z96651 Presence of right artificial knee joint: Secondary | ICD-10-CM | POA: Diagnosis not present

## 2022-11-28 DIAGNOSIS — Z471 Aftercare following joint replacement surgery: Secondary | ICD-10-CM | POA: Diagnosis not present

## 2022-11-28 DIAGNOSIS — I5042 Chronic combined systolic (congestive) and diastolic (congestive) heart failure: Secondary | ICD-10-CM | POA: Diagnosis not present

## 2022-11-28 DIAGNOSIS — I2510 Atherosclerotic heart disease of native coronary artery without angina pectoris: Secondary | ICD-10-CM | POA: Diagnosis not present

## 2022-11-28 DIAGNOSIS — M1990 Unspecified osteoarthritis, unspecified site: Secondary | ICD-10-CM | POA: Diagnosis not present

## 2022-11-28 DIAGNOSIS — E7800 Pure hypercholesterolemia, unspecified: Secondary | ICD-10-CM | POA: Diagnosis not present

## 2022-11-28 NOTE — Telephone Encounter (Signed)
Spoke with patient today as well as treating HHPT. She mentioned that he did not have compression socks, so recommended getting some of these. He is no longer constipated, but having some diarrhea (I think he over-corrected). I encourage fluids and eating small meals. He states he has not been taking his pain medication, so encouraged this b/c he is not doing well at this point with extension/flexion (HH reports -10/49 degrees). I told him this would have to improve. He states he doesn't feel very good and is running a temperature of 99.9. He took Tylenol and I encouraged him to hydrate. BP has been all over the place with higher readings in the morning. I'll check on him again in the morning and continue to try and make sure he is recovering from hospitalization over the weekend. Thanks.

## 2022-11-29 DIAGNOSIS — M545 Low back pain, unspecified: Secondary | ICD-10-CM | POA: Diagnosis not present

## 2022-11-29 DIAGNOSIS — M1990 Unspecified osteoarthritis, unspecified site: Secondary | ICD-10-CM | POA: Diagnosis not present

## 2022-11-29 DIAGNOSIS — I2510 Atherosclerotic heart disease of native coronary artery without angina pectoris: Secondary | ICD-10-CM | POA: Diagnosis not present

## 2022-11-29 DIAGNOSIS — E7800 Pure hypercholesterolemia, unspecified: Secondary | ICD-10-CM | POA: Diagnosis not present

## 2022-11-29 DIAGNOSIS — G8929 Other chronic pain: Secondary | ICD-10-CM | POA: Diagnosis not present

## 2022-11-29 DIAGNOSIS — I5042 Chronic combined systolic (congestive) and diastolic (congestive) heart failure: Secondary | ICD-10-CM | POA: Diagnosis not present

## 2022-11-29 DIAGNOSIS — Z471 Aftercare following joint replacement surgery: Secondary | ICD-10-CM | POA: Diagnosis not present

## 2022-11-29 DIAGNOSIS — I110 Hypertensive heart disease with heart failure: Secondary | ICD-10-CM | POA: Diagnosis not present

## 2022-11-29 DIAGNOSIS — Z96651 Presence of right artificial knee joint: Secondary | ICD-10-CM | POA: Diagnosis not present

## 2022-11-30 ENCOUNTER — Telehealth: Payer: Self-pay | Admitting: *Deleted

## 2022-11-30 ENCOUNTER — Other Ambulatory Visit: Payer: Self-pay | Admitting: Orthopaedic Surgery

## 2022-11-30 MED ORDER — OXYCODONE HCL 5 MG PO TABS: 5.0000 mg | ORAL_TABLET | ORAL | 0 refills | Status: DC | PRN

## 2022-11-30 NOTE — Telephone Encounter (Signed)
Patient feeling better overall. Still running low grade fever up to 100 occasionally. Tylenol works to decrease quickly. Doesn't feel bad he states. Would like a refill of pain medication prior to the weekend. Pharmacy on chart.

## 2022-12-01 DIAGNOSIS — M1990 Unspecified osteoarthritis, unspecified site: Secondary | ICD-10-CM | POA: Diagnosis not present

## 2022-12-01 DIAGNOSIS — E7800 Pure hypercholesterolemia, unspecified: Secondary | ICD-10-CM | POA: Diagnosis not present

## 2022-12-01 DIAGNOSIS — G8929 Other chronic pain: Secondary | ICD-10-CM | POA: Diagnosis not present

## 2022-12-01 DIAGNOSIS — I5042 Chronic combined systolic (congestive) and diastolic (congestive) heart failure: Secondary | ICD-10-CM | POA: Diagnosis not present

## 2022-12-01 DIAGNOSIS — M545 Low back pain, unspecified: Secondary | ICD-10-CM | POA: Diagnosis not present

## 2022-12-01 DIAGNOSIS — Z471 Aftercare following joint replacement surgery: Secondary | ICD-10-CM | POA: Diagnosis not present

## 2022-12-01 DIAGNOSIS — Z96651 Presence of right artificial knee joint: Secondary | ICD-10-CM | POA: Diagnosis not present

## 2022-12-01 DIAGNOSIS — I110 Hypertensive heart disease with heart failure: Secondary | ICD-10-CM | POA: Diagnosis not present

## 2022-12-01 DIAGNOSIS — I2510 Atherosclerotic heart disease of native coronary artery without angina pectoris: Secondary | ICD-10-CM | POA: Diagnosis not present

## 2022-12-04 ENCOUNTER — Encounter: Payer: Medicare PPO | Admitting: Orthopaedic Surgery

## 2022-12-04 DIAGNOSIS — I2510 Atherosclerotic heart disease of native coronary artery without angina pectoris: Secondary | ICD-10-CM | POA: Diagnosis not present

## 2022-12-04 DIAGNOSIS — M545 Low back pain, unspecified: Secondary | ICD-10-CM | POA: Diagnosis not present

## 2022-12-04 DIAGNOSIS — Z96651 Presence of right artificial knee joint: Secondary | ICD-10-CM | POA: Diagnosis not present

## 2022-12-04 DIAGNOSIS — M1990 Unspecified osteoarthritis, unspecified site: Secondary | ICD-10-CM | POA: Diagnosis not present

## 2022-12-04 DIAGNOSIS — E7800 Pure hypercholesterolemia, unspecified: Secondary | ICD-10-CM | POA: Diagnosis not present

## 2022-12-04 DIAGNOSIS — Z471 Aftercare following joint replacement surgery: Secondary | ICD-10-CM | POA: Diagnosis not present

## 2022-12-04 DIAGNOSIS — I5042 Chronic combined systolic (congestive) and diastolic (congestive) heart failure: Secondary | ICD-10-CM | POA: Diagnosis not present

## 2022-12-04 DIAGNOSIS — I110 Hypertensive heart disease with heart failure: Secondary | ICD-10-CM | POA: Diagnosis not present

## 2022-12-04 DIAGNOSIS — G8929 Other chronic pain: Secondary | ICD-10-CM | POA: Diagnosis not present

## 2022-12-05 DIAGNOSIS — M545 Low back pain, unspecified: Secondary | ICD-10-CM | POA: Diagnosis not present

## 2022-12-05 DIAGNOSIS — E7800 Pure hypercholesterolemia, unspecified: Secondary | ICD-10-CM | POA: Diagnosis not present

## 2022-12-05 DIAGNOSIS — I110 Hypertensive heart disease with heart failure: Secondary | ICD-10-CM | POA: Diagnosis not present

## 2022-12-05 DIAGNOSIS — G8929 Other chronic pain: Secondary | ICD-10-CM | POA: Diagnosis not present

## 2022-12-05 DIAGNOSIS — Z96651 Presence of right artificial knee joint: Secondary | ICD-10-CM | POA: Diagnosis not present

## 2022-12-05 DIAGNOSIS — I5042 Chronic combined systolic (congestive) and diastolic (congestive) heart failure: Secondary | ICD-10-CM | POA: Diagnosis not present

## 2022-12-05 DIAGNOSIS — I2510 Atherosclerotic heart disease of native coronary artery without angina pectoris: Secondary | ICD-10-CM | POA: Diagnosis not present

## 2022-12-05 DIAGNOSIS — M1990 Unspecified osteoarthritis, unspecified site: Secondary | ICD-10-CM | POA: Diagnosis not present

## 2022-12-05 DIAGNOSIS — Z471 Aftercare following joint replacement surgery: Secondary | ICD-10-CM | POA: Diagnosis not present

## 2022-12-06 ENCOUNTER — Encounter: Payer: Self-pay | Admitting: Orthopaedic Surgery

## 2022-12-06 ENCOUNTER — Other Ambulatory Visit: Payer: Self-pay

## 2022-12-06 ENCOUNTER — Ambulatory Visit (INDEPENDENT_AMBULATORY_CARE_PROVIDER_SITE_OTHER): Payer: Medicare PPO | Admitting: Orthopaedic Surgery

## 2022-12-06 ENCOUNTER — Telehealth: Payer: Self-pay | Admitting: *Deleted

## 2022-12-06 DIAGNOSIS — Z96651 Presence of right artificial knee joint: Secondary | ICD-10-CM | POA: Diagnosis not present

## 2022-12-06 NOTE — Telephone Encounter (Signed)
14 day in office appt today with Dr. Magnus Ivan. Met with patient. A CBC and BMET were drawn in office today. Contacted Dr. Rondel Baton office at [facility] to notify that these were drawn. Can fax when results are available in EPIC.

## 2022-12-06 NOTE — Progress Notes (Signed)
The patient is here today for his first postoperative visit status post a right total knee arthroplasty.  He did develop dehydration after surgery and was readmitted.  His creatinine was high but has come back down to normal prior to his discharge and he was doing well.  He did have some anemia as well.  He is reporting better range of motion and strength.  He said that physical therapy was able to flex him to 83 degrees yesterday.  He is ambulate with a cane.  His calf is soft.  His knee incision looks good with staples removed and Steri-Strips applied.  He lacks full extension of that right knee by about 3 to 5 degrees and I can flex him to about 80 degrees.  It is incidental that he proceed with outpatient physical therapy for aggressive motion of that knee.  We will see him back in 4 weeks for repeat exam.  We will draw CBC and BMET today to see what his hemoglobin and hematocrit look like as well as his creatinine.  If there are issues we will give him a call about those.

## 2022-12-07 ENCOUNTER — Telehealth: Payer: Self-pay | Admitting: *Deleted

## 2022-12-07 DIAGNOSIS — M1711 Unilateral primary osteoarthritis, right knee: Secondary | ICD-10-CM | POA: Diagnosis not present

## 2022-12-07 LAB — CBC WITH DIFFERENTIAL/PLATELET
Absolute Monocytes: 770 cells/uL (ref 200–950)
Basophils Absolute: 76 cells/uL (ref 0–200)
Basophils Relative: 0.8 %
Eosinophils Absolute: 228 cells/uL (ref 15–500)
Eosinophils Relative: 2.4 %
HCT: 33 % — ABNORMAL LOW (ref 38.5–50.0)
Hemoglobin: 10.9 g/dL — ABNORMAL LOW (ref 13.2–17.1)
Lymphs Abs: 1872 cells/uL (ref 850–3900)
MCH: 31.5 pg (ref 27.0–33.0)
MCHC: 33 g/dL (ref 32.0–36.0)
MCV: 95.4 fL (ref 80.0–100.0)
MPV: 10.7 fL (ref 7.5–12.5)
Monocytes Relative: 8.1 %
Neutro Abs: 6555 cells/uL (ref 1500–7800)
Neutrophils Relative %: 69 %
Platelets: 529 10*3/uL — ABNORMAL HIGH (ref 140–400)
RBC: 3.46 10*6/uL — ABNORMAL LOW (ref 4.20–5.80)
RDW: 12.5 % (ref 11.0–15.0)
Total Lymphocyte: 19.7 %
WBC: 9.5 10*3/uL (ref 3.8–10.8)

## 2022-12-07 LAB — BASIC METABOLIC PANEL
BUN: 16 mg/dL (ref 7–25)
CO2: 29 mmol/L (ref 20–32)
Calcium: 9 mg/dL (ref 8.6–10.3)
Chloride: 99 mmol/L (ref 98–110)
Creat: 1.06 mg/dL (ref 0.70–1.28)
Glucose, Bld: 98 mg/dL (ref 65–99)
Potassium: 5.1 mmol/L (ref 3.5–5.3)
Sodium: 136 mmol/L (ref 135–146)

## 2022-12-07 NOTE — Telephone Encounter (Signed)
Labs drawn in office on 12/06/22 faxed to Dr. Wynonia Lawman office with [facility].

## 2022-12-11 DIAGNOSIS — M1711 Unilateral primary osteoarthritis, right knee: Secondary | ICD-10-CM | POA: Diagnosis not present

## 2022-12-12 ENCOUNTER — Telehealth: Payer: Self-pay | Admitting: *Deleted

## 2022-12-12 NOTE — Telephone Encounter (Signed)
Ortho bundle call completed. 

## 2022-12-13 DIAGNOSIS — M1711 Unilateral primary osteoarthritis, right knee: Secondary | ICD-10-CM | POA: Diagnosis not present

## 2022-12-14 ENCOUNTER — Other Ambulatory Visit: Payer: Self-pay | Admitting: Orthopaedic Surgery

## 2022-12-14 ENCOUNTER — Telehealth: Payer: Self-pay | Admitting: *Deleted

## 2022-12-14 MED ORDER — OXYCODONE HCL 5 MG PO TABS: 5.0000 mg | ORAL_TABLET | Freq: Two times a day (BID) | ORAL | 0 refills | Status: DC | PRN

## 2022-12-14 NOTE — Telephone Encounter (Signed)
Patient requesting refill of pain medication. States just using for nighttime. Thank you.

## 2022-12-14 NOTE — Telephone Encounter (Signed)
Call to patient and updated. 

## 2022-12-15 ENCOUNTER — Other Ambulatory Visit: Payer: Self-pay | Admitting: Orthopaedic Surgery

## 2022-12-15 DIAGNOSIS — M1711 Unilateral primary osteoarthritis, right knee: Secondary | ICD-10-CM | POA: Diagnosis not present

## 2022-12-19 DIAGNOSIS — M1711 Unilateral primary osteoarthritis, right knee: Secondary | ICD-10-CM | POA: Diagnosis not present

## 2022-12-22 DIAGNOSIS — M1711 Unilateral primary osteoarthritis, right knee: Secondary | ICD-10-CM | POA: Diagnosis not present

## 2022-12-27 ENCOUNTER — Ambulatory Visit (INDEPENDENT_AMBULATORY_CARE_PROVIDER_SITE_OTHER): Payer: Medicare PPO | Admitting: Physician Assistant

## 2022-12-27 ENCOUNTER — Telehealth: Payer: Self-pay | Admitting: *Deleted

## 2022-12-27 ENCOUNTER — Encounter: Payer: Self-pay | Admitting: Physician Assistant

## 2022-12-27 DIAGNOSIS — M1711 Unilateral primary osteoarthritis, right knee: Secondary | ICD-10-CM | POA: Diagnosis not present

## 2022-12-27 DIAGNOSIS — Z96651 Presence of right artificial knee joint: Secondary | ICD-10-CM

## 2022-12-27 NOTE — Telephone Encounter (Signed)
Ortho bundle 30 day in office meeting completed. 

## 2022-12-27 NOTE — Progress Notes (Signed)
HPI: Mr. Nunzio Cobbs returns today status post right total knee arthroplasty 11/21/2022.  He states that he has 5-6 out of 10 pain at worst.  Takes months. Robaxin at night.  Otherwise not taking any pain medications.  He is on chronic aspirin 81 mg daily which she continues to take.  He continues to go to physical therapy and has an appointment later today.  Physical exam: General well-developed well-nourished male who ambulates without any assistive device. Right knee: Surgical incisions well-healed no signs of infection.  He lacks few degrees in full extension and flexes to approximately 105 degrees.  Calf supple nontender.  Dorsiflexion plantarflexion right ankle intact.  Impression: Status post right total knee arthroplasty 11/21/2022  Plan: Plan he will keep working on range of motion strengthening the knee.  Continue work on scar tissue mobilization.  Follow-up with [place] in 4 weeks sooner if there is any questions concerns

## 2022-12-28 DIAGNOSIS — K5909 Other constipation: Secondary | ICD-10-CM | POA: Diagnosis not present

## 2022-12-29 DIAGNOSIS — M1711 Unilateral primary osteoarthritis, right knee: Secondary | ICD-10-CM | POA: Diagnosis not present

## 2023-01-01 DIAGNOSIS — M1711 Unilateral primary osteoarthritis, right knee: Secondary | ICD-10-CM | POA: Diagnosis not present

## 2023-01-02 ENCOUNTER — Encounter: Payer: Self-pay | Admitting: Physician Assistant

## 2023-01-02 ENCOUNTER — Ambulatory Visit (INDEPENDENT_AMBULATORY_CARE_PROVIDER_SITE_OTHER): Payer: Medicare PPO | Admitting: Physician Assistant

## 2023-01-02 DIAGNOSIS — Z96651 Presence of right artificial knee joint: Secondary | ICD-10-CM

## 2023-01-02 MED ORDER — OXYCODONE HCL 5 MG PO TABS: 5.0000 mg | ORAL_TABLET | Freq: Two times a day (BID) | ORAL | 0 refills | Status: DC | PRN

## 2023-01-02 MED ORDER — DOXYCYCLINE HYCLATE 100 MG PO TABS: 100.0000 mg | ORAL_TABLET | Freq: Two times a day (BID) | ORAL | 0 refills | Status: DC

## 2023-01-02 NOTE — Progress Notes (Signed)
HPI: [name] returns today due to some redness distal end of his right total knee incision.  He states this just began the other day.  Said no fevers chills.  He is status post right total knee arthroplasty 11/21/2022.  Review of systems see HPI otherwise negative  Physical exam: Right knee distal incision there is slight erythema.  There is slight spit stitch in this region.  Remainder of the wound is without any signs of infection.  There is no dehiscence.  Spit stitch is removed no purulence encountered.  There was some slight serosanguineous fluid that was expressed.  Full extension full flexion of the right knee.  Calf supple nontender.  He is ambulating without any assistive device.   Impression: Status post right total knee arthroplasty  Plan: Will have him wash the wound with an antibacterial soap daily and then apply a small amount of mupirocin.  He is placed on doxycycline 100 mg twice daily for 14 days.  Will see him back in 2 weeks sooner if there is any questions or concerns.  Band-Aid was placed over the distal incision with small amount of mupirocin

## 2023-01-03 DIAGNOSIS — M1711 Unilateral primary osteoarthritis, right knee: Secondary | ICD-10-CM | POA: Diagnosis not present

## 2023-01-08 ENCOUNTER — Other Ambulatory Visit: Payer: Self-pay | Admitting: Physician Assistant

## 2023-01-08 ENCOUNTER — Telehealth: Payer: Self-pay | Admitting: *Deleted

## 2023-01-08 MED ORDER — SULFAMETHOXAZOLE-TRIMETHOPRIM 400-80 MG PO TABS: 1.0000 | ORAL_TABLET | Freq: Two times a day (BID) | ORAL | 0 refills | Status: DC

## 2023-01-08 NOTE — Telephone Encounter (Signed)
Patient left VM today stating he took antibiotic last week you prescribed (Doxycycline) and it made him feel "horrible". He stopped taking after 3 days. Asked if there was another antibiotic that could be prescribed?

## 2023-01-08 NOTE — Telephone Encounter (Signed)
Patient called and made aware.

## 2023-01-09 DIAGNOSIS — M1711 Unilateral primary osteoarthritis, right knee: Secondary | ICD-10-CM | POA: Diagnosis not present

## 2023-01-10 DIAGNOSIS — M1711 Unilateral primary osteoarthritis, right knee: Secondary | ICD-10-CM | POA: Diagnosis not present

## 2023-01-12 ENCOUNTER — Other Ambulatory Visit: Payer: Self-pay | Admitting: Orthopaedic Surgery

## 2023-01-13 ENCOUNTER — Other Ambulatory Visit: Payer: Self-pay | Admitting: Urology

## 2023-01-15 ENCOUNTER — Ambulatory Visit (INDEPENDENT_AMBULATORY_CARE_PROVIDER_SITE_OTHER): Payer: Medicare PPO | Admitting: Physician Assistant

## 2023-01-15 ENCOUNTER — Encounter: Payer: Self-pay | Admitting: Physician Assistant

## 2023-01-15 DIAGNOSIS — Z96651 Presence of right artificial knee joint: Secondary | ICD-10-CM

## 2023-01-15 NOTE — Progress Notes (Signed)
HPI: [name] returns today status post right total knee arthroplasty.  He states overall he is doing well he only has pain at night ranks his pain to be 6 out of 10 at worst.  He has had no fevers or chills.  He takes occasional oxycodone for pain at night.  He is working with therapy on range of motion strengthening.  Physical exam: General well-developed well-nourished male ambulates without any assistive devices nonantalgic gait. Right knee full extension flexion to approximately 115 degrees.  No instability valgus varus stressing.  Surgical incisions healing well no signs of infection.  Impression: Status post right total knee arthroplasty 11/21/2022  Plan: Continue work on range of motion and strengthening.  Will see him back at 6 months postop at time of pain and AP pelvis and lateral view of his right knee.  Questions were encouraged and answered at length.  Follow-up sooner if there is any questions or concerns.

## 2023-01-17 DIAGNOSIS — D225 Melanocytic nevi of trunk: Secondary | ICD-10-CM | POA: Diagnosis not present

## 2023-01-17 DIAGNOSIS — Z08 Encounter for follow-up examination after completed treatment for malignant neoplasm: Secondary | ICD-10-CM | POA: Diagnosis not present

## 2023-01-17 DIAGNOSIS — M1711 Unilateral primary osteoarthritis, right knee: Secondary | ICD-10-CM | POA: Diagnosis not present

## 2023-01-17 DIAGNOSIS — Z1283 Encounter for screening for malignant neoplasm of skin: Secondary | ICD-10-CM | POA: Diagnosis not present

## 2023-01-17 DIAGNOSIS — Z86006 Personal history of melanoma in-situ: Secondary | ICD-10-CM | POA: Diagnosis not present

## 2023-01-24 ENCOUNTER — Encounter: Payer: Medicare PPO | Admitting: Orthopaedic Surgery

## 2023-01-24 DIAGNOSIS — M1711 Unilateral primary osteoarthritis, right knee: Secondary | ICD-10-CM | POA: Diagnosis not present

## 2023-01-26 ENCOUNTER — Telehealth: Payer: Self-pay | Admitting: *Deleted

## 2023-01-26 ENCOUNTER — Other Ambulatory Visit: Payer: Self-pay | Admitting: Orthopaedic Surgery

## 2023-01-26 ENCOUNTER — Other Ambulatory Visit (HOSPITAL_COMMUNITY): Payer: Self-pay

## 2023-01-26 DIAGNOSIS — I255 Ischemic cardiomyopathy: Secondary | ICD-10-CM

## 2023-01-26 DIAGNOSIS — M1711 Unilateral primary osteoarthritis, right knee: Secondary | ICD-10-CM | POA: Diagnosis not present

## 2023-01-26 DIAGNOSIS — I119 Hypertensive heart disease without heart failure: Secondary | ICD-10-CM

## 2023-01-26 MED ORDER — SPIRONOLACTONE 25 MG PO TABS
25.0000 mg | ORAL_TABLET | Freq: Every day | ORAL | 2 refills | Status: DC
Start: 2023-01-26 — End: 2023-09-05

## 2023-01-26 MED ORDER — OXYCODONE HCL 5 MG PO TABS: 5.0000 mg | ORAL_TABLET | Freq: Every day | ORAL | 0 refills | Status: DC | PRN

## 2023-01-26 NOTE — Telephone Encounter (Signed)
Patient made aware.

## 2023-01-26 NOTE — Telephone Encounter (Signed)
Patient called asking about sleeping. States he does well during the day and does take a pain pill at night and goes to sleep, but wakes up with pain and can't get back to sleep. He takes Trazadone as well as a muscle relaxer and nothing seems to work over a couple of hours. He asked if there was anything else to do. I made him aware this is a very common complaint and this should improve with time. I recommended when he wakes up something herbal, like a sleepy time tea and he is going to try. Any other recommendations? He would like a refill of pain medication if he could get. States he takes only at night if he needs this. Thanks.

## 2023-01-31 DIAGNOSIS — M1711 Unilateral primary osteoarthritis, right knee: Secondary | ICD-10-CM | POA: Diagnosis not present

## 2023-02-12 ENCOUNTER — Other Ambulatory Visit: Payer: Self-pay | Admitting: Urology

## 2023-02-14 NOTE — Telephone Encounter (Signed)
done

## 2023-02-15 ENCOUNTER — Other Ambulatory Visit: Payer: Self-pay | Admitting: Orthopaedic Surgery

## 2023-02-20 ENCOUNTER — Telehealth: Payer: Self-pay | Admitting: *Deleted

## 2023-02-20 NOTE — Telephone Encounter (Signed)
Patient called stating he was doing well from S/P Right total knee arthroplasty on 11/21/22 with Dr. Magnus Ivan. He has been seen by Dr. Otelia Sergeant in the past for disc disease. S/P Lumbar fusion L2-L5 as well as remote ACDF surgery. He states he has had a previous injection per Dr. Alvester Morin, but it's been a while. Wanted to know if he could be seen to see about another one since back pain is "flaring up". He is traveling in early September and wanted to see about getting it done ASAP. I spoke with Dr. Alvester Morin and we discussed putting on your schedule. Thank you.

## 2023-02-21 ENCOUNTER — Ambulatory Visit: Payer: Medicare PPO | Admitting: Urology

## 2023-02-21 VITALS — BP 160/79 | HR 61

## 2023-02-21 DIAGNOSIS — N5201 Erectile dysfunction due to arterial insufficiency: Secondary | ICD-10-CM | POA: Diagnosis not present

## 2023-02-21 MED ORDER — TADALAFIL 5 MG PO TABS: 5.0000 mg | ORAL_TABLET | Freq: Every evening | ORAL | 11 refills | Status: DC

## 2023-02-21 MED ORDER — TADALAFIL 20 MG PO TABS: 20.0000 mg | ORAL_TABLET | Freq: Every day | ORAL | 0 refills | Status: DC | PRN

## 2023-02-21 NOTE — Telephone Encounter (Signed)
Spoke with patient and scheduled OV for 02/22/23.

## 2023-02-21 NOTE — Progress Notes (Signed)
02/21/2023 2:26 PM   Brian Gallegos [birth year] [id number]  Referring provider: Sigmund Hazel, MD [address] [place],  [place] [address]  Followup erectile dysfunction   HPI: [name] is a 77yo here for followup for erectile dysfunction. 77yo has tried and failed sildenafil. He tried tadalafil 20mg  prn with fair results. IPSS 12 QOl 3. He has mild urinary frequency and nocturia 1x. No other complaints today   PMH: Past Medical History:  Diagnosis Date   Arthritis    "all over"   Cancer [facility])    Skin Cancer removed off back   CHF (congestive heart failure) (HCC)    Chronic lower back pain    Coronary artery disease    DES pRCA 10/12/15   Early cataracts, bilateral    Family history of adverse reaction to anesthesia    "son had ligament repair of elbow in 2016; woke up nauseated and disoriented"   Hypercholesterolemia    Hypertension    Migraine    Restless leg syndrome    Wears glasses     Surgical History: Past Surgical History:  Procedure Laterality Date   ANTERIOR CERVICAL DECOMP/DISCECTOMY FUSION  ~ 2005   ANTERIOR CERVICAL DISCECTOMY     BACK SURGERY     CARDIAC CATHETERIZATION N/A 10/12/2015   Procedure: Left Heart Cath and Coronary Angiography;  Surgeon: Yates Decamp, MD;  Location: [facility] INVASIVE CV LAB;  Service: Cardiovascular;  Laterality: N/A;   CARDIAC CATHETERIZATION  10/12/2015   Procedure: Coronary Stent Intervention;  Surgeon: Yates Decamp, MD;  Location: [facility] INVASIVE CV LAB;  Service: Cardiovascular;;   COLONOSCOPY     CORONARY ANGIOPLASTY  10/12/2015   DES RCA   CYSTOSCOPY WITH INSERTION OF UROLIFT N/A 05/02/2021   Procedure: CYSTOSCOPY WITH INSERTION OF UROLIFT;  Surgeon: Malen Gauze, MD;  Location: [facility];  Service: Urology;  Laterality: N/A;   EYE SURGERY Left    cataract   FRACTURE SURGERY     nose repaired from accident   GLAUCOMA SURGERY Left 03/04/2015   JOINT REPLACEMENT     KNEE ARTHROSCOPY Bilateral    LEFT HEART CATH AND  CORONARY ANGIOGRAPHY N/A 01/17/2022   Procedure: LEFT HEART CATH AND CORONARY ANGIOGRAPHY;  Surgeon: Yates Decamp, MD;  Location: [facility] INVASIVE CV LAB;  Service: Cardiovascular;  Laterality: N/A;   LUMBAR DISC SURGERY  ~ 1986-02/08/2007 X 6   MAXIMUM ACCESS (MAS)POSTERIOR LUMBAR INTERBODY FUSION (PLIF) 3 LEVEL  02/08/2007   w/rods and screws   RIGHT HEART CATH N/A 02/21/2022   Procedure: RIGHT HEART CATH;  Surgeon: Laurey Morale, MD;  Location: [facility] INVASIVE CV LAB;  Service: Cardiovascular;  Laterality: N/A;   SPINAL CORD STIMULATOR INSERTION N/A 05/08/2014   Procedure: LUMBAR SPINAL CORD STIMULATOR IMPLANT ;  Surgeon: Gwynne Edinger, MD;  Location: [facility] NEURO ORS;  Service: Neurosurgery;  Laterality: N/A;   TOE FUSION Right 06/02/2014   "outside part of big toe"   TOTAL KNEE ARTHROPLASTY Left ~ 2011   TOTAL KNEE ARTHROPLASTY Right 11/21/2022   Procedure: RIGHT TOTAL KNEE ARTHROPLASTY;  Surgeon: Kathryne Hitch, MD;  Location: [facility] OR;  Service: Orthopedics;  Laterality: Right;    Home Medications:  Allergies as of 02/21/2023       Reactions   Dulcolax [bisacodyl] Other (See Comments)   Severe stomach cramps   Codeine Itching   Low amount is tolerated   Proscar [finasteride] Hives, Rash        Medication List  Accurate as of February 21, 2023  2:26 PM. If you have any questions, ask your nurse or doctor.          Advil Dual Action 125-250 MG Tabs Generic drug: Ibuprofen-Acetaminophen Take 2 tablets by mouth every 8 (eight) hours as needed (pain).   aspirin 81 MG chewable tablet CHEW AND SWALLOW 1 TABLET(81 MG) BY MOUTH TWICE DAILY   bisoprolol 5 MG tablet Commonly known as: ZEBETA Take 0.5 tablets (2.5 mg total) by mouth daily.   Cholecalciferol 25 MCG (1000 UT) tablet Take 1,000 Units by mouth in the morning.   diclofenac Sodium 1 % Gel Commonly known as: VOLTAREN Apply 1 Application topically as needed (pain).   DULoxetine 20 MG capsule Commonly known as:  CYMBALTA TAKE 1 CAPSULE(20 MG) BY MOUTH DAILY What changed: See the new instructions.   empagliflozin 10 MG Tabs tablet Commonly known as: Jardiance TAKE 1 TABLET(10 MG) BY MOUTH EVERY MORNING   Entresto 49-51 MG Generic drug: sacubitril-valsartan Take 1 tablet by mouth 2 (two) times daily.   furosemide 40 MG tablet Commonly known as: LASIX Take 40 mg by mouth daily.   gabapentin 300 MG capsule Commonly known as: NEURONTIN TAKE 1 CAPSULE(300 MG) BY MOUTH AT BEDTIME   lubiprostone 8 MCG capsule Commonly known as: AMITIZA Take 8 mcg by mouth 2 (two) times daily with a meal.   methocarbamol 500 MG tablet Commonly known as: ROBAXIN TAKE 1 TABLET(500 MG) BY MOUTH EVERY 6 HOURS AS NEEDED FOR MUSCLE SPASMS   oxyCODONE 5 MG immediate release tablet Commonly known as: Oxy IR/ROXICODONE Take 1-2 tablets (5-10 mg total) by mouth daily as needed for moderate pain (pain score 4-6).   oxymetazoline 0.05 % nasal spray Commonly known as: AFRIN Place 1 spray into both nostrils 2 (two) times daily as needed for congestion.   pyridoxine 100 MG tablet Commonly known as: B-6 Take 100 mg by mouth in the morning.   rOPINIRole 2 MG tablet Commonly known as: REQUIP Take 4 mg by mouth See admin instructions. Take 4 mg in the afternoon and 4 mg at bedtime, may take a third 4 mg dose as needed for restless legs   rosuvastatin 40 MG tablet Commonly known as: CRESTOR Take 20 mg by mouth at bedtime.   spironolactone 25 MG tablet Commonly known as: ALDACTONE Take 1 tablet (25 mg total) by mouth daily.   sulfamethoxazole-trimethoprim 400-80 MG tablet Commonly known as: BACTRIM Take 1 tablet by mouth 2 (two) times daily.   tadalafil 5 MG tablet Commonly known as: CIALIS Take 1 tablet (5 mg total) by mouth daily. What changed: when to take this   tadalafil 20 MG tablet Commonly known as: CIALIS TAKE 1 TABLET BY MOUTH ONCE DAILY AS NEEDED FOR ERECTILE DYSFUNCTION What changed: Another  medication with the same name was changed. Make sure you understand how and when to take each.   tamsulosin 0.4 MG Caps capsule Commonly known as: FLOMAX TAKE 1 CAPSULE(0.4 MG) BY MOUTH IN THE MORNING AND AT BEDTIME What changed: See the new instructions.   traZODone 100 MG tablet Commonly known as: DESYREL Take 100 mg by mouth at bedtime.   vitamin C 250 MG tablet Commonly known as: ASCORBIC ACID Take 250 mg by mouth daily.        Allergies:  Allergies  Allergen Reactions   Dulcolax [Bisacodyl] Other (See Comments)    Severe stomach cramps   Codeine Itching    Low amount is tolerated   Proscar [  Finasteride] Hives and Rash    Family History: Family History  Problem Relation Age of Onset   Aneurysm Mother    Heart attack Father    Hypertension Father    CAD Father    Cancer Other     Social History:  reports that he has never smoked. He has never used smokeless tobacco. He reports current alcohol use of about 4.0 - 5.0 standard drinks of alcohol per week. He reports that he does not use drugs.  ROS: All other review of systems were reviewed and are negative except what is noted above in HPI  Physical Exam: BP (!) 160/79   Pulse 61   Constitutional:  Alert and oriented, No acute distress. HEENT: [place] AT, moist mucus membranes.  Trachea midline, no masses. Cardiovascular: No clubbing, cyanosis, or edema. Respiratory: Normal respiratory effort, no increased work of breathing. GI: Abdomen is soft, nontender, nondistended, no abdominal masses GU: No CVA tenderness.  Lymph: No cervical or inguinal lymphadenopathy. Skin: No rashes, bruises or suspicious lesions. Neurologic: Grossly intact, no focal deficits, moving all 4 extremities. Psychiatric: Normal mood and affect.  Laboratory Data: Lab Results  Component Value Date   WBC 9.5 12/06/2022   HGB 10.9 (L) 12/06/2022   HCT 33.0 (L) 12/06/2022   MCV 95.4 12/06/2022   PLT 529 (H) 12/06/2022    Lab Results   Component Value Date   CREATININE 1.06 12/06/2022    No results found for: "PSA"  Lab Results  Component Value Date   TESTOSTERONE 611 06/15/2020    Lab Results  Component Value Date   HGBA1C  04/20/2010    5.6 (NOTE)                                                                       According to the ADA Clinical Practice Recommendations for 2011, when HbA1c is used as a screening test:   >=6.5%   Diagnostic of Diabetes Mellitus           (if abnormal result  is confirmed)  5.7-6.4%   Increased risk of developing Diabetes Mellitus  References:Diagnosis and Classification of Diabetes Mellitus,Diabetes Care,2011,34(Suppl 1):S62-S69 and Standards of Medical Care in         Diabetes - 2011,Diabetes Care,2011,34  (Suppl 1):S11-S61.    Urinalysis    Component Value Date/Time   COLORURINE YELLOW 11/24/2022 2033   APPEARANCEUR CLEAR 11/24/2022 2033   APPEARANCEUR Clear 08/04/2022 0948   LABSPEC 1.012 11/24/2022 2033   PHURINE 5.0 11/24/2022 2033   GLUCOSEU >=500 (A) 11/24/2022 2033   HGBUR NEGATIVE 11/24/2022 2033   BILIRUBINUR NEGATIVE 11/24/2022 2033   BILIRUBINUR Negative 08/04/2022 0948   KETONESUR NEGATIVE 11/24/2022 2033   PROTEINUR NEGATIVE 11/24/2022 2033   UROBILINOGEN 0.2 04/21/2010 1203   NITRITE NEGATIVE 11/24/2022 2033   LEUKOCYTESUR NEGATIVE 11/24/2022 2033    Lab Results  Component Value Date   LABMICR Comment 08/04/2022   WBCUA 11-30 (A) 01/28/2021   LABEPIT 0-10 01/28/2021   MUCUS Present 01/28/2021   BACTERIA RARE (A) 11/24/2022    Pertinent Imaging:  No results found for this or any previous visit.  No results found for this or any previous visit.  No results found for this or any previous  visit.  No results found for this or any previous visit.  No results found for this or any previous visit.  No valid procedures specified. No results found for this or any previous visit.  No results found for this or any previous visit.   Assessment  & Plan:    1. Erectile dysfunction due to arterial insufficiency -We discussed trimix, vacuum erection device, and IPP. The patient is interested in a vacuum erection device and continue tadalafil prn   No follow-ups on file.  Wilkie Aye, MD  [facility] [place]

## 2023-02-22 ENCOUNTER — Ambulatory Visit: Payer: Medicare PPO | Admitting: Physical Medicine and Rehabilitation

## 2023-02-22 ENCOUNTER — Encounter: Payer: Self-pay | Admitting: Physical Medicine and Rehabilitation

## 2023-02-22 DIAGNOSIS — G8929 Other chronic pain: Secondary | ICD-10-CM

## 2023-02-22 DIAGNOSIS — M961 Postlaminectomy syndrome, not elsewhere classified: Secondary | ICD-10-CM | POA: Diagnosis not present

## 2023-02-22 DIAGNOSIS — G894 Chronic pain syndrome: Secondary | ICD-10-CM

## 2023-02-22 DIAGNOSIS — M545 Low back pain, unspecified: Secondary | ICD-10-CM | POA: Diagnosis not present

## 2023-02-22 NOTE — Progress Notes (Signed)
Functional Pain Scale - descriptive words and definitions  Moderate (4)   Constantly aware of pain, can complete ADLs with modification/sleep marginally affected at times/passive distraction is of no use, but active distraction gives some relief. Moderate range order  Average Pain 5   +Driver, -BT, -Dye Allergies.  Lower back pain all the way across

## 2023-02-22 NOTE — Progress Notes (Signed)
Brian Gallegos - 77 y.o. male MRN [id number]  Date of birth: 1945/10/17  Office Visit Note: Visit Date: 02/22/2023 PCP: Sigmund Hazel, MD Referred by: Sigmund Hazel, MD  Subjective: Chief Complaint  Patient presents with   Lower Back - Pain   HPI: Brian Gallegos is a 77 y.o. male who comes in today as a self referral for evaluation of chronic, worsening and severe bilateral lower back pain. Patient is well known to [place], last seen in our office in 2015. Pain ongoing for several years, worsens with standing and activity. He describes pain as pulling and tight sensation, currently rates as 5 out of 10. Some relief of pain with home exercise regimen, rest and use of medications. CT of lumbar spine from 2015 exhibits solid fusion L2-L3 and L3-L4. Marked right sided foraminal stenosis at L4-L5. Prior lumbar fusion with Dr. Vira Browns in 2008. Patient has undergone multiple interventional spine procedures in our office including lumbar radiofrequency ablation, lumbar epidural steroid injections and spinal cord stimulator trial. Significant relief of pain with spinal cord stimulator trial, proceeded to have implant with Dr. Odette Fraction with [facility] in 2015. Reports good relief of pain over the years with stimulator, to his knowledge he has not had device reprogrammed since implant. He is planning on going out of town for long trip first of October. Patient currently managed from orthopedic standpoint by Dr. Doneen Poisson. Patient denies focal weakness, numbness and tingling. No recent trauma or falls.      Review of Systems  Musculoskeletal:  Positive for back pain.  Neurological:  Negative for tingling, sensory change, focal weakness and weakness.  All other systems reviewed and are negative.  Otherwise per HPI.  Assessment & Plan: Visit Diagnoses:    ICD-10-CM   1. Chronic bilateral low back pain without sciatica  M54.50    G89.29     2. 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 Chronic pain syndrome  G89.4        Plan: Findings:  Chronic, worsening and severe bilateral lower back pain. Patient continues to have severe pain despite good conservative therapies such as home exercise regimen, rest and use of medications. Patients clinical presentation and exam are complex, his symptoms do correlate with facet mediated pain. Next step is to arrange for patient to have spinal cord stimulator re-programmed to better cover bilateral lower back. I also discussed advanced imaging and will place order for new CT of lumbar spine, he is not able to have MRI imaging as SCS device is not compatible. I will contact Enrique Sack with [employer] regarding re-programming. Patient did inquire about spine injection today, however I explained to him that we would need new advanced imaging before proceeding with any type of interventional procedure as his last CT of lumbar spine was from 2015. I am hopeful re-programming of SCS will help him greatly. We should be able to get him in for re-programming before he leaves for trip. Patient has no questions at this time. No red flag symptoms noted upon exam today.     Meds & Orders: No orders of the defined types were placed in this encounter.  No orders of the defined types were placed in this encounter.   Follow-up: Return for re-programming of SCS.   Procedures: No procedures performed      Clinical History: CLINICAL DATA:  Low back pain with sciatica.  Left-sided pain.   EXAM: CT LUMBAR SPINE WITHOUT  CONTRAST   TECHNIQUE: Multidetector CT imaging of the lumbar spine was performed without intravenous contrast administration. Multiplanar CT image reconstructions were also generated.   COMPARISON:  Lumbar MRI 07/04/2013   FINDINGS: Pedicle screw and interbody fusion L2-3, L3-4 and L4-5. Spinal cord stimulator is noted extending above the field of view.   Normal alignment.  Negative for fracture or mass  lesion.   T12-L1:  Mild disc and facet degeneration with mild spinal stenosis   L1-2: Moderate facet hypertrophy. Disc bulging and mild spinal stenosis.   L2-3: Pedicle screw and interbody fusion. Solid bony fusion. No significant spinal stenosis   L3-4: Pedicle screw and interbody fusion. Solid fusion. Considerable streak artifact through the canal without significant spinal stenosis   L4-5: Pedicle screw and interbody fusion with posterior laminectomy. Possible early bony fusion however pseudarthrosis not excluded at this level. Marked right foraminal encroachment due to spurring. Moderate left foraminal encroachment due to spurring   L5-S1: Disc degeneration and spondylosis. Bilateral facet hypertrophy. Marked foraminal encroachment bilaterally with impingement of the L5 nerve root bilaterally.   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   Solid fusion L2-3 and L3-4. Possible pseudarthrosis versus early fusion at L4-5.   Marked right foraminal encroachment at L4-5 with moderate left foraminal encroachment L4-5   Marked foraminal encroachment bilaterally L5-S1.     Electronically Signed   By: Marlan Palau M.D.   On: 05/11/2014 13:05   He reports that he has never smoked. He has never used smokeless tobacco. No results for input(s): "HGBA1C", "LABURIC" in the last 8760 hours.  Objective:  VS:  HT:    WT:   BMI:     BP:   HR: bpm  TEMP: ( )  RESP:  Physical Exam Vitals and nursing note reviewed.  HENT:     Head: Normocephalic and atraumatic.     Right Ear: External ear normal.     Left Ear: External ear normal.     Nose: Nose normal.     Mouth/Throat:     Mouth: Mucous membranes are moist.  Eyes:     Extraocular Movements: Extraocular movements intact.  Cardiovascular:     Rate and Rhythm: Normal rate.     Pulses: Normal pulses.  Pulmonary:     Effort: Pulmonary effort is normal.  Abdominal:     General: Abdomen is flat. There is no  distension.  Musculoskeletal:        General: Tenderness present.     Cervical back: Normal range of motion.     Comments: Patient rises from seated position to standing without difficulty. Pain noted with facet loading and lumbar extension. 5/5 strength noted with bilateral hip flexion, knee flexion/extension, ankle dorsiflexion/plantarflexion and EHL. No clonus noted bilaterally. No pain upon palpation of greater trochanters. No pain with internal/external rotation of bilateral hips. Sensation intact bilaterally. Negative slump test bilaterally. Ambulates without aid, gait steady.     Skin:    General: Skin is warm and dry.     Capillary Refill: Capillary refill takes less than 2 seconds.  Neurological:     General: No focal deficit present.     Mental Status: He is alert and oriented to person, place, and time.  Psychiatric:        Mood and Affect: Mood normal.        Behavior: Behavior normal.     Ortho Exam  Imaging: No results found.  Past Medical/Family/Surgical/Social History: Medications & Allergies reviewed per EMR, new  medications updated. Patient Active Problem List   Diagnosis Date Noted   Acute postoperative anemia due to expected blood loss 11/24/2022   Status post total right knee replacement 11/21/2022   Chronic combined systolic and diastolic heart failure (HCC) 02/08/2022   Coronary artery disease    Erectile dysfunction due to arterial insufficiency 07/30/2020   Benign prostatic hyperplasia with urinary obstruction 06/14/2020   Urinary retention 06/14/2020   Post PTCA 10/12/2015   Abnormal cardiovascular stress test 10/11/2015   Atypical chest pain 10/11/2015   Past Medical History:  Diagnosis Date   Arthritis    "all over"   Cancer [facility])    Skin Cancer removed off back   CHF (congestive heart failure) (HCC)    Chronic lower back pain    Coronary artery disease    DES pRCA 10/12/15   Early cataracts, bilateral    Family history of adverse reaction to  anesthesia    "son had ligament repair of elbow in 2016; woke up nauseated and disoriented"   Hypercholesterolemia    Hypertension    Migraine    Restless leg syndrome    Wears glasses    Family History  Problem Relation Age of Onset   Aneurysm Mother    Heart attack Father    Hypertension Father    CAD Father    Cancer Other    Past Surgical History:  Procedure Laterality Date   ANTERIOR CERVICAL DECOMP/DISCECTOMY FUSION  ~ 2005   ANTERIOR CERVICAL DISCECTOMY     BACK SURGERY     CARDIAC CATHETERIZATION N/A 10/12/2015   Procedure: Left Heart Cath and Coronary Angiography;  Surgeon: Yates Decamp, MD;  Location[facility] INVASIVE CV LAB;  Service: Cardiovascular;  Laterality: N/A;   CARDIAC CATHETERIZATION  10/12/2015   Procedure: Coronary Stent Intervention;  Surgeon: Yates Decamp, MD;  Location: [facility] INVASIVE CV LAB;  Service: Cardiovascular;;   COLONOSCOPY     CORONARY ANGIOPLASTY  10/12/2015   DES RCA   CYSTOSCOPY WITH INSERTION OF UROLIFT N/A 05/02/2021   Procedure: CYSTOSCOPY WITH INSERTION OF UROLIFT;  Surgeon: Malen Gauze, MD;  Location: [facility]S;  Service: Urology;  Laterality: N/A;   EYE SURGERY Left    cataract   FRACTURE SURGERY     nose repaired from accident   GLAUCOMA SURGERY Left 03/04/2015   JOINT REPLACEMENT     KNEE ARTHROSCOPY Bilateral    LEFT HEART CATH AND CORONARY ANGIOGRAPHY N/A 01/17/2022   Procedure: LEFT HEART CATH AND CORONARY ANGIOGRAPHY;  Surgeon: Yates Decamp, MD;  Location: [facility] INVASIVE CV LAB;  Service: Cardiovascular;  Laterality: N/A;   LUMBAR DISC SURGERY  ~ 1986-02/08/2007 X 6   MAXIMUM ACCESS (MAS)POSTERIOR LUMBAR INTERBODY FUSION (PLIF) 3 LEVEL  02/08/2007   w/rods and screws   RIGHT HEART CATH N/A 02/21/2022   Procedure: RIGHT HEART CATH;  Surgeon: Laurey Morale, MD;  Location: [facility] INVASIVE CV LAB;  Service: Cardiovascular;  Laterality: N/A;   SPINAL CORD STIMULATOR INSERTION N/A 05/08/2014   Procedure: LUMBAR SPINAL CORD STIMULATOR IMPLANT ;   Surgeon: Gwynne Edinger, MD;  Location: [facility] NEURO ORS;  Service: Neurosurgery;  Laterality: N/A;   TOE FUSION Right 06/02/2014   "outside part of big toe"   TOTAL KNEE ARTHROPLASTY Left ~ 2011   TOTAL KNEE ARTHROPLASTY Right 11/21/2022   Procedure: RIGHT TOTAL KNEE ARTHROPLASTY;  Surgeon: Kathryne Hitch, MD;  Location: [facility] OR;  Service: Orthopedics;  Laterality: Right;   Social History   Occupational History  Not on file  Tobacco Use   Smoking status: Never   Smokeless tobacco: Never  Vaping Use   Vaping status: Never Used  Substance and Sexual Activity   Alcohol use: Yes    Alcohol/week: 4.0 - 5.0 standard drinks of alcohol    Types: 4 - 5 Glasses of wine per week   Drug use: No   Sexual activity: Not Currently

## 2023-02-23 ENCOUNTER — Encounter: Payer: Self-pay | Admitting: Urology

## 2023-02-23 ENCOUNTER — Other Ambulatory Visit: Payer: Self-pay | Admitting: Orthopaedic Surgery

## 2023-02-27 ENCOUNTER — Encounter: Payer: Self-pay | Admitting: Urology

## 2023-02-27 NOTE — Patient Instructions (Signed)

## 2023-03-01 ENCOUNTER — Ambulatory Visit
Admission: RE | Admit: 2023-03-01 | Discharge: 2023-03-01 | Disposition: A | Discharge status: Home / Self Care | Payer: Medicare PPO | Source: Ambulatory Visit | Attending: Physical Medicine and Rehabilitation | Admitting: Physical Medicine and Rehabilitation

## 2023-03-01 DIAGNOSIS — M961 Postlaminectomy syndrome, not elsewhere classified: Secondary | ICD-10-CM

## 2023-03-01 DIAGNOSIS — G894 Chronic pain syndrome: Secondary | ICD-10-CM

## 2023-03-01 DIAGNOSIS — M545 Low back pain, unspecified: Secondary | ICD-10-CM

## 2023-03-01 DIAGNOSIS — M48062 Spinal stenosis, lumbar region with neurogenic claudication: Secondary | ICD-10-CM | POA: Diagnosis not present

## 2023-03-13 ENCOUNTER — Other Ambulatory Visit: Payer: Self-pay | Admitting: Orthopaedic Surgery

## 2023-03-13 ENCOUNTER — Other Ambulatory Visit: Payer: Self-pay | Admitting: Medical Genetics

## 2023-03-13 DIAGNOSIS — Z006 Encounter for examination for normal comparison and control in clinical research program: Secondary | ICD-10-CM

## 2023-03-21 ENCOUNTER — Other Ambulatory Visit (HOSPITAL_COMMUNITY): Payer: Self-pay

## 2023-03-21 MED ORDER — BISOPROLOL FUMARATE 5 MG PO TABS: 2.5000 mg | ORAL_TABLET | Freq: Every day | ORAL | 3 refills | Status: DC

## 2023-03-26 ENCOUNTER — Telehealth: Payer: Self-pay | Admitting: *Deleted

## 2023-03-26 ENCOUNTER — Other Ambulatory Visit: Payer: Self-pay | Admitting: Orthopaedic Surgery

## 2023-03-26 MED ORDER — OXYCODONE HCL 5 MG PO TABS: 5.0000 mg | ORAL_TABLET | Freq: Every day | ORAL | 0 refills | Status: DC | PRN

## 2023-03-26 NOTE — Telephone Encounter (Signed)
Message to patient updating medication has been refilled by MD.

## 2023-03-26 NOTE — Telephone Encounter (Signed)
Patient called and left VM asking for refill of pain medication to have b/c he is leaving in a week to go on a 10 day cruise and was going to be doing a lot of walking. R-TKA 11/21/22.

## 2023-04-17 ENCOUNTER — Other Ambulatory Visit: Payer: Self-pay | Admitting: Orthopaedic Surgery

## 2023-05-01 ENCOUNTER — Encounter: Payer: Self-pay | Admitting: Urology

## 2023-05-04 ENCOUNTER — Telehealth (HOSPITAL_COMMUNITY): Payer: Self-pay

## 2023-05-04 NOTE — Telephone Encounter (Signed)
Patient called stating that this morning he has had a headache along with some dizziness, the headache stopped after taking some pain medication however the dizziness is still there a little bit. He reports he did check ital signs are within normal range Bp: 140s

## 2023-05-05 DIAGNOSIS — H8111 Benign paroxysmal vertigo, right ear: Secondary | ICD-10-CM | POA: Diagnosis not present

## 2023-05-05 DIAGNOSIS — J019 Acute sinusitis, unspecified: Secondary | ICD-10-CM | POA: Diagnosis not present

## 2023-05-05 NOTE — Telephone Encounter (Signed)
Would call back to see if symptoms have resolved.

## 2023-05-11 NOTE — Telephone Encounter (Signed)
Spoke w/pt, he states he went to urgent care and was diagnosed with vertigo and sinusitis, was prescribed meds including prednisone and feels much better now.

## 2023-05-15 ENCOUNTER — Other Ambulatory Visit: Payer: Self-pay | Admitting: Urology

## 2023-05-15 ENCOUNTER — Encounter: Payer: Self-pay | Admitting: Urology

## 2023-05-15 NOTE — Telephone Encounter (Signed)
Pt states tamsulosin refill keeps getting denied.  Ok to send in rx?  Please advise.

## 2023-05-16 ENCOUNTER — Other Ambulatory Visit (INDEPENDENT_AMBULATORY_CARE_PROVIDER_SITE_OTHER): Payer: Self-pay

## 2023-05-16 ENCOUNTER — Encounter: Payer: Self-pay | Admitting: Orthopaedic Surgery

## 2023-05-16 ENCOUNTER — Ambulatory Visit: Payer: Medicare PPO | Admitting: Orthopaedic Surgery

## 2023-05-16 DIAGNOSIS — Z96651 Presence of right artificial knee joint: Secondary | ICD-10-CM

## 2023-05-16 NOTE — Progress Notes (Signed)
The patient is now 6 months status post right total knee arthroplasty.  He is a very active 77 year old gentleman.  He says the knee is doing great.  He has a history of a previous left knee replacement done sometime ago.  He says good range of motion and strength at night from time to time but it does not slow him down.   On exam both knees have full flexion and full extension.  Both knees are ligamentously stable.  His left operative knee just has some slight swelling comparing to the right side but this is normal and should get less as time goes by.  He has excellent strength in both knees.  An AP and lateral of the right knee also shows the left knee on the AP view standing.  Both knees look good in terms of the replacements.  There is no complicating features of the right knee only lateral view.  At this point follow-up for his knee can be as needed.  If he does develop any issues at all he knows to come see [place].  All questions and concerns were answered and addressed.

## 2023-05-18 ENCOUNTER — Other Ambulatory Visit: Payer: Self-pay

## 2023-05-18 MED ORDER — TAMSULOSIN HCL 0.4 MG PO CAPS: 0.4000 mg | ORAL_CAPSULE | Freq: Every morning | ORAL | 11 refills | Status: DC

## 2023-06-18 ENCOUNTER — Other Ambulatory Visit: Payer: Self-pay | Admitting: Physician Assistant

## 2023-06-19 ENCOUNTER — Other Ambulatory Visit (HOSPITAL_COMMUNITY): Payer: Self-pay

## 2023-06-19 DIAGNOSIS — I5041 Acute combined systolic (congestive) and diastolic (congestive) heart failure: Secondary | ICD-10-CM

## 2023-06-19 DIAGNOSIS — I5042 Chronic combined systolic (congestive) and diastolic (congestive) heart failure: Secondary | ICD-10-CM

## 2023-06-19 MED ORDER — EMPAGLIFLOZIN 10 MG PO TABS: ORAL_TABLET | ORAL | 0 refills | Status: DC

## 2023-06-20 ENCOUNTER — Other Ambulatory Visit: Payer: Self-pay | Admitting: Physician Assistant

## 2023-07-18 ENCOUNTER — Other Ambulatory Visit: Payer: Self-pay | Admitting: Orthopaedic Surgery

## 2023-07-18 ENCOUNTER — Other Ambulatory Visit: Payer: Self-pay | Admitting: Urology

## 2023-07-18 DIAGNOSIS — Z85820 Personal history of malignant melanoma of skin: Secondary | ICD-10-CM | POA: Diagnosis not present

## 2023-07-18 DIAGNOSIS — D225 Melanocytic nevi of trunk: Secondary | ICD-10-CM | POA: Diagnosis not present

## 2023-07-18 DIAGNOSIS — L820 Inflamed seborrheic keratosis: Secondary | ICD-10-CM | POA: Diagnosis not present

## 2023-07-18 DIAGNOSIS — Z08 Encounter for follow-up examination after completed treatment for malignant neoplasm: Secondary | ICD-10-CM | POA: Diagnosis not present

## 2023-07-18 DIAGNOSIS — Z1283 Encounter for screening for malignant neoplasm of skin: Secondary | ICD-10-CM | POA: Diagnosis not present

## 2023-07-25 ENCOUNTER — Telehealth: Payer: Self-pay

## 2023-07-25 NOTE — Telephone Encounter (Signed)
PA started Key: [id number]

## 2023-08-13 ENCOUNTER — Other Ambulatory Visit (HOSPITAL_COMMUNITY): Payer: Self-pay

## 2023-08-13 DIAGNOSIS — I5042 Chronic combined systolic (congestive) and diastolic (congestive) heart failure: Secondary | ICD-10-CM

## 2023-08-13 MED ORDER — SACUBITRIL-VALSARTAN 49-51 MG PO TABS: 1.0000 | ORAL_TABLET | Freq: Two times a day (BID) | ORAL | 0 refills | Status: DC

## 2023-08-15 ENCOUNTER — Other Ambulatory Visit: Payer: Self-pay | Admitting: Cardiology

## 2023-08-16 ENCOUNTER — Other Ambulatory Visit: Payer: Self-pay | Admitting: Orthopaedic Surgery

## 2023-08-22 ENCOUNTER — Ambulatory Visit: Payer: Medicare PPO | Admitting: Urology

## 2023-08-29 ENCOUNTER — Other Ambulatory Visit (HOSPITAL_COMMUNITY): Payer: Self-pay | Admitting: Cardiology

## 2023-08-29 ENCOUNTER — Ambulatory Visit: Payer: Medicare PPO | Admitting: Urology

## 2023-08-29 VITALS — BP 114/67 | HR 75

## 2023-08-29 DIAGNOSIS — R339 Retention of urine, unspecified: Secondary | ICD-10-CM

## 2023-08-29 DIAGNOSIS — N138 Other obstructive and reflux uropathy: Secondary | ICD-10-CM

## 2023-08-29 DIAGNOSIS — N529 Male erectile dysfunction, unspecified: Secondary | ICD-10-CM

## 2023-08-29 DIAGNOSIS — N5201 Erectile dysfunction due to arterial insufficiency: Secondary | ICD-10-CM

## 2023-08-29 MED ORDER — ALPROSTADIL (VASODILATOR) 1000 MCG UR PLLT: 1000.0000 ug | PELLET | URETHRAL | 12 refills | Status: DC | PRN

## 2023-08-29 MED ORDER — FUROSEMIDE 40 MG PO TABS: 40.0000 mg | ORAL_TABLET | Freq: Every day | ORAL | 0 refills | Status: DC

## 2023-08-29 NOTE — Progress Notes (Signed)
 08/29/2023 10:02 AM   Brian Gallegos 10-29-1945 [id number]  Referring provider: Sigmund Hazel, MD [address] [place],  [place] [address]  Erectile dysfunction   HPI: Brian Gallegos is a 77yo here for followup for erectile dysfunction. He is currently on tadalafil 5mg  daily and 20mg  prn. He cannot maintain his erection. He has tried trimix which gave a painful erection.    PMH: Past Medical History:  Diagnosis Date   Arthritis    "all over"   Cancer [facility])    Skin Cancer removed off back   CHF (congestive heart failure) (HCC)    Chronic lower back pain    Coronary artery disease    DES pRCA 10/12/15   Early cataracts, bilateral    Family history of adverse reaction to anesthesia    "son had ligament repair of elbow in 2016; woke up nauseated and disoriented"   Hypercholesterolemia    Hypertension    Migraine    Restless leg syndrome    Wears glasses     Surgical History: Past Surgical History:  Procedure Laterality Date   ANTERIOR CERVICAL DECOMP/DISCECTOMY FUSION  ~ 2005   ANTERIOR CERVICAL DISCECTOMY     BACK SURGERY     CARDIAC CATHETERIZATION N/A 10/12/2015   Procedure: Left Heart Cath and Coronary Angiography;  Surgeon: Yates Decamp, MD;  Location: [facility] INVASIVE CV LAB;  Service: Cardiovascular;  Laterality: N/A;   CARDIAC CATHETERIZATION  10/12/2015   Procedure: Coronary Stent Intervention;  Surgeon: Yates Decamp, MD;  [facility] INVASIVE CV LAB;  Service: Cardiovascular;;   COLONOSCOPY     CORONARY ANGIOPLASTY  10/12/2015   DES RCA   CYSTOSCOPY WITH INSERTION OF UROLIFT N/A 05/02/2021   Procedure: CYSTOSCOPY WITH INSERTION OF UROLIFT;  Surgeon: Malen Gauze, MD;  Location: [facility] ORS;  Service: Urology;  Laterality: N/A;   EYE SURGERY Left    cataract   FRACTURE SURGERY     nose repaired from accident   GLAUCOMA SURGERY Left 03/04/2015   JOINT REPLACEMENT     KNEE ARTHROSCOPY Bilateral    LEFT HEART CATH AND CORONARY ANGIOGRAPHY N/A 01/17/2022    Procedure: LEFT HEART CATH AND CORONARY ANGIOGRAPHY;  Surgeon: Yates Decamp, MD;  Location: [facility] INVASIVE CV LAB;  Service: Cardiovascular;  Laterality: N/A;   LUMBAR DISC SURGERY  ~ 1986-02/08/2007 X 6   MAXIMUM ACCESS (MAS)POSTERIOR LUMBAR INTERBODY FUSION (PLIF) 3 LEVEL  02/08/2007   w/rods and screws   RIGHT HEART CATH N/A 02/21/2022   Procedure: RIGHT HEART CATH;  Surgeon: Laurey Morale, MD;  Location: [facility] INVASIVE CV LAB;  Service: Cardiovascular;  Laterality: N/A;   SPINAL CORD STIMULATOR INSERTION N/A 05/08/2014   Procedure: LUMBAR SPINAL CORD STIMULATOR IMPLANT ;  Surgeon: Gwynne Edinger, MD;  Location: [facility] NEURO ORS;  Service: Neurosurgery;  Laterality: N/A;   TOE FUSION Right 06/02/2014   "outside part of big toe"   TOTAL KNEE ARTHROPLASTY Left ~ 2011   TOTAL KNEE ARTHROPLASTY Right 11/21/2022   Procedure: RIGHT TOTAL KNEE ARTHROPLASTY;  Surgeon: Kathryne Hitch, MD;  Location: [facility] OR;  Service: Orthopedics;  Laterality: Right;    Home Medications:  Allergies as of 08/29/2023       Reactions   Dulcolax [bisacodyl] Other (See Comments)   Severe stomach cramps   Codeine Itching   Low amount is tolerated   Proscar [finasteride] Hives, Rash        Medication List        Accurate as of  August 29, 2023 10:02 AM. If you have any questions, ask your nurse or doctor.          Advil Dual Action 125-250 MG Tabs Generic drug: Ibuprofen-Acetaminophen Take 2 tablets by mouth every 8 (eight) hours as needed (pain).   aspirin 81 MG chewable tablet CHEW AND SWALLOW 1 TABLET(81 MG) BY MOUTH TWICE DAILY   bisoprolol 5 MG tablet Commonly known as: ZEBETA Take 0.5 tablets (2.5 mg total) by mouth daily.   Cholecalciferol 25 MCG (1000 UT) tablet Take 1,000 Units by mouth in the morning.   diclofenac Sodium 1 % Gel Commonly known as: VOLTAREN Apply 1 Application topically as needed (pain).   DULoxetine 20 MG capsule Commonly known as: CYMBALTA TAKE 1 CAPSULE(20 MG) BY  MOUTH DAILY What changed: See the new instructions.   empagliflozin 10 MG Tabs tablet Commonly known as: Jardiance TAKE 1 TABLET(10 MG) BY MOUTH EVERY MORNING   furosemide 40 MG tablet Commonly known as: LASIX Take 40 mg by mouth daily.   gabapentin 300 MG capsule Commonly known as: NEURONTIN TAKE 1 CAPSULE(300 MG) BY MOUTH AT BEDTIME   lubiprostone 8 MCG capsule Commonly known as: AMITIZA Take 8 mcg by mouth 2 (two) times daily with a meal.   methocarbamol 500 MG tablet Commonly known as: ROBAXIN TAKE 1 TABLET(500 MG) BY MOUTH EVERY 6 HOURS AS NEEDED FOR MUSCLE SPASMS   oxyCODONE 5 MG immediate release tablet Commonly known as: Oxy IR/ROXICODONE Take 1-2 tablets (5-10 mg total) by mouth daily as needed for moderate pain (pain score 4-6).   oxymetazoline 0.05 % nasal spray Commonly known as: AFRIN Place 1 spray into both nostrils 2 (two) times daily as needed for congestion.   pyridoxine 100 MG tablet Commonly known as: B-6 Take 100 mg by mouth in the morning.   rOPINIRole 2 MG tablet Commonly known as: REQUIP Take 4 mg by mouth See admin instructions. Take 4 mg in the afternoon and 4 mg at bedtime, may take a third 4 mg dose as needed for restless legs   rosuvastatin 40 MG tablet Commonly known as: CRESTOR Take 20 mg by mouth at bedtime.   sacubitril-valsartan 49-51 MG Commonly known as: Entresto Take 1 tablet by mouth 2 (two) times daily.   spironolactone 25 MG tablet Commonly known as: ALDACTONE Take 1 tablet (25 mg total) by mouth daily.   sulfamethoxazole-trimethoprim 400-80 MG tablet Commonly known as: BACTRIM Take 1 tablet by mouth 2 (two) times daily.   tadalafil 5 MG tablet Commonly known as: CIALIS Take 1 tablet (5 mg total) by mouth at bedtime.   tadalafil 20 MG tablet Commonly known as: CIALIS TAKE 1 TABLET BY MOUTH ONCE DAILY AS NEEDED FOR ERECTILE DYSFUNCTION   tamsulosin 0.4 MG Caps capsule Commonly known as: FLOMAX Take 1 capsule (0.4  mg total) by mouth in the morning.   traZODone 100 MG tablet Commonly known as: DESYREL Take 100 mg by mouth at bedtime.   vitamin C 250 MG tablet Commonly known as: ASCORBIC ACID Take 250 mg by mouth daily.        Allergies:  Allergies  Allergen Reactions   Dulcolax [Bisacodyl] Other (See Comments)    Severe stomach cramps   Codeine Itching    Low amount is tolerated   Proscar [Finasteride] Hives and Rash    Family History: Family History  Problem Relation Age of Onset   Aneurysm Mother    Heart attack Father    Hypertension Father  CAD Father    Cancer Other     Social History:  reports that he has never smoked. He has never used smokeless tobacco. He reports current alcohol use of about 4.0 - 5.0 standard drinks of alcohol per week. He reports that he does not use drugs.  ROS: All other review of systems were reviewed and are negative except what is noted above in HPI  Physical Exam: BP 114/67   Pulse 75   Constitutional:  Alert and oriented, No acute distress. HEENT: [place] AT, moist mucus membranes.  Trachea midline, no masses. Cardiovascular: No clubbing, cyanosis, or edema. Respiratory: Normal respiratory effort, no increased work of breathing. GI: Abdomen is soft, nontender, nondistended, no abdominal masses GU: No CVA tenderness.  Lymph: No cervical or inguinal lymphadenopathy. Skin: No rashes, bruises or suspicious lesions. Neurologic: Grossly intact, no focal deficits, moving all 4 extremities. Psychiatric: Normal mood and affect.  Laboratory Data: Lab Results  Component Value Date   WBC 9.5 12/06/2022   HGB 10.9 (L) 12/06/2022   HCT 33.0 (L) 12/06/2022   MCV 95.4 12/06/2022   PLT 529 (H) 12/06/2022    Lab Results  Component Value Date   CREATININE 1.06 12/06/2022    No results found for: "PSA"  Lab Results  Component Value Date   TESTOSTERONE 611 06/15/2020    Lab Results  Component Value Date   HGBA1C  04/20/2010    5.6 (NOTE)                                                                        According to the ADA Clinical Practice Recommendations for 2011, when HbA1c is used as a screening test:   >=6.5%   Diagnostic of Diabetes Mellitus           (if abnormal result  is confirmed)  5.7-6.4%   Increased risk of developing Diabetes Mellitus  References:Diagnosis and Classification of Diabetes Mellitus,Diabetes Care,2011,34(Suppl 1):S62-S69 and Standards of Medical Care in         Diabetes - 2011,Diabetes Care,2011,34  (Suppl 1):S11-S61.    Urinalysis    Component Value Date/Time   COLORURINE YELLOW 11/24/2022 2033   APPEARANCEUR CLEAR 11/24/2022 2033   APPEARANCEUR Clear 08/04/2022 0948   LABSPEC 1.012 11/24/2022 2033   PHURINE 5.0 11/24/2022 2033   GLUCOSEU >=500 (A) 11/24/2022 2033   HGBUR NEGATIVE 11/24/2022 2033   BILIRUBINUR NEGATIVE 11/24/2022 2033   BILIRUBINUR Negative 08/04/2022 0948   KETONESUR NEGATIVE 11/24/2022 2033   PROTEINUR NEGATIVE 11/24/2022 2033   UROBILINOGEN 0.2 04/21/2010 1203   NITRITE NEGATIVE 11/24/2022 2033   LEUKOCYTESUR NEGATIVE 11/24/2022 2033    Lab Results  Component Value Date   LABMICR Comment 08/04/2022   WBCUA 11-30 (A) 01/28/2021   LABEPIT 0-10 01/28/2021   MUCUS Present 01/28/2021   BACTERIA RARE (A) 11/24/2022    Pertinent Imaging:  No results found for this or any previous visit.  No results found for this or any previous visit.  No results found for this or any previous visit.  No results found for this or any previous visit.  No results found for this or any previous visit.  No results found for this or any previous visit.  No results found for  this or any previous visit.  No results found for this or any previous visit.   Assessment & Plan:    1. Erectile dysfunction, unspecified erectile dysfunction type (Primary) We will trial Muse prn     No follow-ups on file.  Wilkie Aye, MD  [facility] [place]

## 2023-09-04 ENCOUNTER — Encounter: Payer: Self-pay | Admitting: Urology

## 2023-09-04 NOTE — Patient Instructions (Signed)

## 2023-09-05 ENCOUNTER — Other Ambulatory Visit (HOSPITAL_COMMUNITY): Payer: Self-pay | Admitting: Cardiology

## 2023-09-05 DIAGNOSIS — I255 Ischemic cardiomyopathy: Secondary | ICD-10-CM

## 2023-09-05 DIAGNOSIS — I119 Hypertensive heart disease without heart failure: Secondary | ICD-10-CM

## 2023-09-12 ENCOUNTER — Other Ambulatory Visit: Payer: Self-pay | Admitting: Orthopaedic Surgery

## 2023-09-12 ENCOUNTER — Other Ambulatory Visit (HOSPITAL_COMMUNITY): Payer: Self-pay | Admitting: Cardiology

## 2023-09-12 ENCOUNTER — Other Ambulatory Visit: Payer: Self-pay | Admitting: Urology

## 2023-09-12 DIAGNOSIS — I5042 Chronic combined systolic (congestive) and diastolic (congestive) heart failure: Secondary | ICD-10-CM

## 2023-09-20 ENCOUNTER — Other Ambulatory Visit (HOSPITAL_COMMUNITY): Payer: Self-pay | Admitting: Cardiology

## 2023-09-21 ENCOUNTER — Other Ambulatory Visit (HOSPITAL_COMMUNITY): Payer: Self-pay

## 2023-09-21 DIAGNOSIS — I5042 Chronic combined systolic (congestive) and diastolic (congestive) heart failure: Secondary | ICD-10-CM

## 2023-09-21 MED ORDER — EMPAGLIFLOZIN 10 MG PO TABS: ORAL_TABLET | ORAL | 1 refills | Status: DC

## 2023-09-26 ENCOUNTER — Encounter (HOSPITAL_COMMUNITY): Payer: Medicare PPO

## 2023-09-26 DIAGNOSIS — M79642 Pain in left hand: Secondary | ICD-10-CM | POA: Diagnosis not present

## 2023-09-26 DIAGNOSIS — Z6830 Body mass index (BMI) 30.0-30.9, adult: Secondary | ICD-10-CM | POA: Diagnosis not present

## 2023-09-26 DIAGNOSIS — L989 Disorder of the skin and subcutaneous tissue, unspecified: Secondary | ICD-10-CM | POA: Diagnosis not present

## 2023-09-26 DIAGNOSIS — I5042 Chronic combined systolic (congestive) and diastolic (congestive) heart failure: Secondary | ICD-10-CM | POA: Diagnosis not present

## 2023-09-26 DIAGNOSIS — E66811 Obesity, class 1: Secondary | ICD-10-CM | POA: Diagnosis not present

## 2023-09-26 DIAGNOSIS — M79641 Pain in right hand: Secondary | ICD-10-CM | POA: Diagnosis not present

## 2023-09-26 DIAGNOSIS — R7303 Prediabetes: Secondary | ICD-10-CM | POA: Diagnosis not present

## 2023-10-10 DIAGNOSIS — Z0000 Encounter for general adult medical examination without abnormal findings: Secondary | ICD-10-CM | POA: Diagnosis not present

## 2023-10-10 DIAGNOSIS — Z1159 Encounter for screening for other viral diseases: Secondary | ICD-10-CM | POA: Diagnosis not present

## 2023-10-10 DIAGNOSIS — Z6830 Body mass index (BMI) 30.0-30.9, adult: Secondary | ICD-10-CM | POA: Diagnosis not present

## 2023-10-15 ENCOUNTER — Encounter (HOSPITAL_COMMUNITY)

## 2023-10-19 ENCOUNTER — Other Ambulatory Visit: Payer: Self-pay | Admitting: Urology

## 2023-11-02 ENCOUNTER — Encounter (HOSPITAL_COMMUNITY)

## 2023-11-14 ENCOUNTER — Other Ambulatory Visit (HOSPITAL_COMMUNITY): Payer: Self-pay

## 2023-11-14 DIAGNOSIS — I5042 Chronic combined systolic (congestive) and diastolic (congestive) heart failure: Secondary | ICD-10-CM

## 2023-11-14 MED ORDER — EMPAGLIFLOZIN 10 MG PO TABS: ORAL_TABLET | ORAL | 11 refills | Status: AC

## 2023-11-21 ENCOUNTER — Other Ambulatory Visit: Payer: Self-pay | Admitting: Urology

## 2023-11-21 ENCOUNTER — Other Ambulatory Visit (HOSPITAL_COMMUNITY): Payer: Self-pay | Admitting: Cardiology

## 2023-11-21 DIAGNOSIS — I119 Hypertensive heart disease without heart failure: Secondary | ICD-10-CM

## 2023-11-21 DIAGNOSIS — I255 Ischemic cardiomyopathy: Secondary | ICD-10-CM

## 2023-11-23 ENCOUNTER — Other Ambulatory Visit (HOSPITAL_COMMUNITY): Payer: Self-pay | Admitting: Cardiology

## 2023-11-27 ENCOUNTER — Encounter (HOSPITAL_COMMUNITY): Payer: Self-pay

## 2023-11-27 ENCOUNTER — Ambulatory Visit (HOSPITAL_COMMUNITY): Payer: Self-pay | Admitting: Cardiology

## 2023-11-27 ENCOUNTER — Ambulatory Visit (HOSPITAL_COMMUNITY)
Admission: RE | Admit: 2023-11-27 | Discharge: 2023-11-27 | Disposition: A | Discharge status: Home / Self Care | Source: Ambulatory Visit | Attending: Cardiology | Admitting: Cardiology

## 2023-11-27 ENCOUNTER — Telehealth (HOSPITAL_COMMUNITY): Payer: Self-pay

## 2023-11-27 VITALS — BP 170/88 | HR 55 | Wt 204.0 lb

## 2023-11-27 DIAGNOSIS — Z7722 Contact with and (suspected) exposure to environmental tobacco smoke (acute) (chronic): Secondary | ICD-10-CM | POA: Insufficient documentation

## 2023-11-27 DIAGNOSIS — Z7982 Long term (current) use of aspirin: Secondary | ICD-10-CM | POA: Diagnosis not present

## 2023-11-27 DIAGNOSIS — M549 Dorsalgia, unspecified: Secondary | ICD-10-CM | POA: Diagnosis not present

## 2023-11-27 DIAGNOSIS — Z7984 Long term (current) use of oral hypoglycemic drugs: Secondary | ICD-10-CM | POA: Insufficient documentation

## 2023-11-27 DIAGNOSIS — I10 Essential (primary) hypertension: Secondary | ICD-10-CM

## 2023-11-27 DIAGNOSIS — I255 Ischemic cardiomyopathy: Secondary | ICD-10-CM | POA: Insufficient documentation

## 2023-11-27 DIAGNOSIS — Z955 Presence of coronary angioplasty implant and graft: Secondary | ICD-10-CM | POA: Diagnosis not present

## 2023-11-27 DIAGNOSIS — Z79899 Other long term (current) drug therapy: Secondary | ICD-10-CM | POA: Diagnosis not present

## 2023-11-27 DIAGNOSIS — I2510 Atherosclerotic heart disease of native coronary artery without angina pectoris: Secondary | ICD-10-CM | POA: Diagnosis not present

## 2023-11-27 DIAGNOSIS — I5022 Chronic systolic (congestive) heart failure: Secondary | ICD-10-CM | POA: Diagnosis not present

## 2023-11-27 DIAGNOSIS — I110 Hypertensive heart disease with heart failure: Secondary | ICD-10-CM | POA: Insufficient documentation

## 2023-11-27 LAB — BASIC METABOLIC PANEL WITH GFR
Anion gap: 11 (ref 5–15)
BUN: 14 mg/dL (ref 8–23)
CO2: 31 mmol/L (ref 22–32)
Calcium: 9.3 mg/dL (ref 8.9–10.3)
Chloride: 101 mmol/L (ref 98–111)
Creatinine, Ser: 1.07 mg/dL (ref 0.61–1.24)
GFR, Estimated: >60 mL/min (ref >60)
Glucose, Bld: 98 mg/dL (ref 70–99)
Potassium: 3.9 mmol/L (ref 3.5–5.1)
Sodium: 143 mmol/L (ref 135–145)

## 2023-11-27 LAB — LIPID PANEL
Cholesterol: 140 mg/dL (ref 0–200)
HDL: 57 mg/dL (ref >40)
LDL Cholesterol: 68 mg/dL (ref 0–99)
Total CHOL/HDL Ratio: 2.5 ratio
Triglycerides: 77 mg/dL (ref <150)
VLDL: 15 mg/dL (ref 0–40)

## 2023-11-27 LAB — BRAIN NATRIURETIC PEPTIDE: B Natriuretic Peptide: 268.3 pg/mL — ABNORMAL HIGH (ref 0.0–100.0)

## 2023-11-27 NOTE — Telephone Encounter (Signed)
 Called to confirm/remind patient of their appointment at the [facility] on 11/27/23.   Appointment:   [] Confirmed  [x] Left mess   [] No answer/No voice mail  [] VM Full/unable to leave message  [] Phone not in service  Patient reminded to bring in all medications and/or complete list.

## 2023-11-27 NOTE — Patient Instructions (Addendum)
 Thank you for coming in today  If you had labs drawn today, any labs that are abnormal the clinic will call you No news is good news  Medications: Take additional Lasix  20 mg in afternoon for 2 days then aa needed for shortness of breath  Follow up appointments:  Your physician recommends that you schedule a follow-up appointment in:  2 months With Dr. Mitzie Anda with echocardiogram    Your physician has requested that you have an echocardiogram. Echocardiography is a painless test that uses sound waves to create images of your heart. It provides your doctor with information about the size and shape of your heart and how well your heart's chambers and valves are working. This procedure takes approximately one hour. There are no restrictions for this procedure.    Do the following things EVERYDAY: Weigh yourself in the morning before breakfast. Write it down and keep it in a log. Take your medicines as prescribed Eat low salt foods--Limit salt (sodium) to 2000 mg per day.  Stay as active as you can everyday Limit all fluids for the day to less than 2 liters   At the [facility], you and your health needs are our priority. As part of our continuing mission to provide you with exceptional heart care, we have created designated Provider Care Teams. These Care Teams include your primary Cardiologist (physician) and Advanced Practice Providers (APPs- Physician Assistants and Nurse Practitioners) who all work together to provide you with the care you need, when you need it.   You may see any of the following providers on your designated Care Team at your next follow up: Dr Jules Oar Dr Peder Bourdon Dr. Mimi Alt, NP Ruddy Corral, [place] [facility] [place], [place] Dennise Fitz, NP Luster Salters, PharmD   Please be sure to bring in all your medications bottles to every appointment.    Thank you for choosing [facility]  If you have any questions or concerns before your next appointment please send us  a message through [place] or call our office at [phone].    TO LEAVE A MESSAGE FOR THE NURSE SELECT OPTION 2, PLEASE LEAVE A MESSAGE INCLUDING: YOUR NAME DATE OF BIRTH CALL BACK NUMBER REASON FOR CALL**this is important as we prioritize the call backs  YOU WILL RECEIVE A CALL BACK THE SAME DAY AS LONG AS YOU CALL BEFORE 4:00 PM

## 2023-11-27 NOTE — Progress Notes (Signed)
 ADV[facility]ED [facility] CLINIC NOTE PCP: Perley Bradley, MD HF Cardiology: Dr. Mitzie Anda  Reason for Visit: Heart Failure Follow-up HPI: Brian Gallegos is a 78 y.o. with history of HTN, hyperlipidemia, and CAD was referred by Dr. Albert Huff for evaluation of CHF.  Patient had a cath in 4/17 with DES to 90% proximal RCA stenosis.  Echoes after that time showed mildly decreased systolic function in the 45-50% range.  He was generally asymptomatic, however, until around 6/23.  In 6/23, he developed progressive dyspnea and peripheral edema.  Echo in 6/23 showed EF 40-45%, mild LVH, grade 2 diastolic dysfunction, RV normal, severe LAE, mild-moderate MR.  He was started on Bumex  which gave him headaches, this was switched to Lasix .  Peripheral edema improved but he has continued to feel short of breath with exertion.  No definite trigger for symptoms, no episode of prolonged chest pain or severe viral-type illness.  He had coronary angiography in 7/23, showing patent RCA stent and nonobstructive mild CAD. He drank heavily in the past but not in the last 5 years (3-4 glasses wine/week now). He has a family history of CAD (father at around 80) but not cardiomyopathy or sudden cardiac death.  He was noted to be volume overloaded and Lasix  was increased.   RHC in 8/23 showed normal filling pressures on higher Lasix  dose. CPX in 8/23 showed low normal functional capacity, no more than mild functional limitation due to heart failure.  PFTs were within normal limits.   S/P R TKR in 5/24.  He returns today for heart failure follow up. Overall feeling ok, back pain is biggest concern, has appointment with ortho on Friday. NYHA II, staying active, plays a round of golf twice a week. Reports dyspnea with exertion. Denies chest pain, fatigue, near-syncope, orthopnea, palpitations, dizziness, and abnormal bleeding. Able to perform ADLs. Appetite okay. Does not take weight or BP at home. Compliant with all medications, only taking PM  Lasix  20 mg about 30% of the time.   PMH: 1. HTN 2. Hyperlipidemia 3. CAD: LHC in 4/17 with 90% pRCA treated with DES.  - LHC (7/23): Patent RCA stent, mild nonobstructive disease.  4. Chronic HF with mid range EF: ?Ischemic cardiomyopathy.  - Echo (3/18): EF 50%.  - Echo (7/21): EF 45-50%, septal HK, moderate MR - Echo (6/23): EF 40-45%, mild LVH, grade 2 diastolic dysfunction, RV normal, severe LAE, mild-moderate MR.  - RHC (8/23): mean RA 1, PA 33/13 mean 22, mean PCWP 11, CI 4.08 - CPX (8/23): peak VO2 18.9, VE/VCO2 slope 34, RER 1.22, normal PFTs => low normal functional status, no more than mild HF limitation.   SH: Married x 2, now widower.  Has 2 children.  Lives in [place].  Designer, television/film set.  Drinks 3-4 glasses wine/week. Nonsmoker though exposed to 2nd hand smoke.   FH: Father with MI in his 27s, mother with brain aneurysm in her 103s.   Current Outpatient Medications  Medication Sig Dispense Refill   aspirin  81 MG chewable tablet CHEW AND SWALLOW 1 TABLET(81 MG) BY MOUTH TWICE DAILY 30 tablet 0   bisoprolol  (ZEBETA ) 5 MG tablet Take 0.5 tablets (2.5 mg total) by mouth daily. 45 tablet 3   Cholecalciferol  25 MCG (1000 UT) tablet Take 1,000 Units by mouth in the morning.     DULoxetine  (CYMBALTA ) 20 MG capsule TAKE 1 CAPSULE(20 MG) BY MOUTH DAILY 30 capsule 3   empagliflozin  (JARDIANCE ) 10 MG TABS tablet TAKE 1 TABLET(10 MG) BY MOUTH EVERY  MORNING 30 tablet 11   furosemide  (LASIX ) 40 MG tablet TAKE 1 TABLET(40 MG) BY MOUTH DAILY 60 tablet 0   gabapentin  (NEURONTIN ) 300 MG capsule TAKE 1 CAPSULE(300 MG) BY MOUTH AT BEDTIME 60 capsule 1   Ibuprofen-Acetaminophen  (ADVIL DUAL ACTION) 125-250 MG TABS Take 2 tablets by mouth every 8 (eight) hours as needed (pain).     methocarbamol  (ROBAXIN ) 500 MG tablet TAKE 1 TABLET(500 MG) BY MOUTH EVERY 6 HOURS AS NEEDED FOR MUSCLE SPASMS 30 tablet 0   oxymetazoline (AFRIN) 0.05 % nasal spray Place 1 spray into both nostrils 2 (two) times  daily as needed for congestion.     pyridoxine  (B-6) 100 MG tablet Take 100 mg by mouth in the morning.     rOPINIRole  (REQUIP ) 2 MG tablet Take 4 mg by mouth See admin instructions. Take 4 mg in the afternoon and 4 mg at bedtime, may take a third 4 mg dose as needed for restless legs     rosuvastatin  (CRESTOR ) 40 MG tablet Take 20 mg by mouth at bedtime.     sacubitril -valsartan  (ENTRESTO ) 49-51 MG TAKE 1 TABLET BY MOUTH TWICE DAILY 60 tablet 3   spironolactone  (ALDACTONE ) 25 MG tablet TAKE 1 TABLET(25 MG) BY MOUTH DAILY 30 tablet 1   tadalafil  (CIALIS ) 20 MG tablet TAKE 1 TABLET BY MOUTH ONCE DAILY AS NEEDED FOR ERECTILE DYSFUNCTION 10 tablet 0   tadalafil  (CIALIS ) 5 MG tablet Take 1 tablet (5 mg total) by mouth at bedtime. 30 tablet 11   tamsulosin  (FLOMAX ) 0.4 MG CAPS capsule Take 1 capsule (0.4 mg total) by mouth in the morning. 30 capsule 11   traZODone  (DESYREL ) 100 MG tablet Take 100 mg by mouth at bedtime.     vitamin C  (ASCORBIC ACID ) 250 MG tablet Take 250 mg by mouth daily.     diclofenac  Sodium (VOLTAREN ) 1 % GEL Apply 1 Application topically as needed (pain). (Patient not taking: Reported on 11/27/2023)     No current facility-administered medications for this encounter.   Wt Readings from Last 3 Encounters:  11/27/23 92.5 kg (204 lb)  11/24/22 88.4 kg (194 lb 14.2 oz)  11/21/22 88.5 kg (195 lb)   BP (!) 170/88   Pulse (!) 55   Wt 92.5 kg (204 lb)   SpO2 98%   BMI 30.13 kg/m   Physical Exam General: Well appearing. No distress on RA. Walked into clinic Cardiac: JVP ~8cm. S1 and S2 present. No murmurs or rub. Extremities: Warm and dry.  No peripheral edema.  Neuro: Alert and oriented x3. Affect pleasant. Moves all extremities without difficulty.  ECG (personally reviewed): SB 58 bpm with incomplete LBBB. Poor quality study.  Assessment/Plan: 1. Chronic HF with mid range EF:  Most recent echo in 6/23 showed EF 40-45%, mild LVH, grade 2 diastolic dysfunction, RV normal,  severe LAE, mild-moderate MR.  He has a history of CAD with PCI to proximal RCA in 2017, but feel that CAD/ischemic cardiomyopathy is not a definite explanation for his cardiomyopathy/CHF. LHC in 7/23 showed patent RCA stent and nonobstructive mild CAD.  RHC in 8/23 after augmented diuresis showed normal filling pressures and cardiac output.  CPX in 8/23 showed only mild functional limitation from CHF. - NYHA class II. Mildly volume up on exam.  BMET/BNP today. - Unable to obtain cardiac MRI due to spinal stimulator.  - Continue Entresto  49/51 mg bid. Increase at next f/u if BP remains elevated after back pain resolves.  - Continue Lasix  40 mg  daily, take additional Lasix  20 mg qPM x2 days, then PRN for SOB - Continue bisoprolol  5 mg daily.  - Continue Jardiance  10 mg daily.  - Continue spironolactone  25 mg daily.  - Needs to update echo, will schedule today.  2. CAD: 2017 PCI to RCA.  Cath 7/23 with patent RCA stent and nonobstructive mild disease.  No chest pain.  - Continue ASA 81 + Crestor  20 mg daily. Check lipids today  3. H/o Fall: No further issues. He is on tamsulosin  + tadalafil  nightly, ?Orthostasis. Discussed changing positions slowly, especially at night.  4. HTN - BP elevated, however reports that he is in a significant amount of back pain at current and his BP is not usually high.  Follow up in 3 month with Dr. Mitzie Anda + echo   [place] Veverly Larimer, NP 11/27/2023

## 2023-11-30 ENCOUNTER — Ambulatory Visit: Admitting: Physician Assistant

## 2023-11-30 ENCOUNTER — Other Ambulatory Visit: Payer: Self-pay

## 2023-11-30 ENCOUNTER — Encounter: Payer: Self-pay | Admitting: Physician Assistant

## 2023-11-30 DIAGNOSIS — G8929 Other chronic pain: Secondary | ICD-10-CM | POA: Diagnosis not present

## 2023-11-30 DIAGNOSIS — M545 Low back pain, unspecified: Secondary | ICD-10-CM

## 2023-11-30 NOTE — Progress Notes (Signed)
 Office Visit Note   Patient: Brian Gallegos           Date of Birth: 11/14/1945           MRN: [id number] Visit Date: 11/30/2023              Requested by: Perley Bradley, MD [address] [place],  [place] [address] PCP: Perley Bradley, MD   Assessment & Plan: Visit Diagnoses: Low back pain  Plan: Questionable difficult to interpret the x-ray because of loops of bowel but may be some calcification.  I do not think anything he is having today is secondary to his back issues.  He has no radicular findings she says the pain is different and it is colicky.  I have told him I think it is imperative that he is seen by his primary care or urgent care if nothing else the ER today.  2 of eliminate any other cause for this.  He is requesting pain medication again because I do not have a firm orthopedic diagnosis I have told him he needs to take this up with his primary care urgent care  Follow-Up Instructions: No follow-ups on file.   Orders:  No orders of the defined types were placed in this encounter.  No orders of the defined types were placed in this encounter.     Procedures: No procedures performed   Clinical Data: No additional findings.   Subjective: No chief complaint on file.   HPI patient is a 78 year old gentleman who comes in today with left flank pain.  He said this began on Sunday.  Began as a sharp pain under his rib cage on his back.  He feels like stabbing with knife being twisted in his left lower flank.  He is only sleeping 1 to 2 hours a night.  No history of renal calculi.  No history of hematuria.  No fever or chills he says this pain is different than his back pain and in a different location  Review of Systems  All other systems reviewed and are negative.   Objective: Vital Signs: There were no vitals taken for this visit.  Physical Exam Constitutional:      Appearance: Normal appearance.  Pulmonary:     Effort: Pulmonary effort is normal.   Neurological:     General: No focal deficit present.     Mental Status: He is alert and oriented to person, place, and time.  Psychiatric:        Mood and Affect: Mood normal.        Behavior: Behavior normal.   Ortho Exam Examination he has no tenderness over his lower back no redness no step-offs no radicular findings.  He has focal tenderness with palpation over his left flank.  He is neurovascularly intact good strength sensation is at his baseline Specialty Comments:  CLINICAL DATA:  Low back pain with sciatica.  Left-sided pain.   EXAM: CT LUMBAR SPINE WITHOUT CONTRAST   TECHNIQUE: Multidetector CT imaging of the lumbar spine was performed without intravenous contrast administration. Multiplanar CT image reconstructions were also generated.   COMPARISON:  Lumbar MRI 07/04/2013   FINDINGS: Pedicle screw and interbody fusion L2-3, L3-4 and L4-5. Spinal cord stimulator is noted extending above the field of view.   Normal alignment.  Negative for fracture or mass lesion.   T12-L1:  Mild disc and facet degeneration with mild spinal stenosis   L1-2: Moderate facet hypertrophy. Disc bulging and mild spinal stenosis.  L2-3: Pedicle screw and interbody fusion. Solid bony fusion. No significant spinal stenosis   L3-4: Pedicle screw and interbody fusion. Solid fusion. Considerable streak artifact through the canal without significant spinal stenosis   L4-5: Pedicle screw and interbody fusion with posterior laminectomy. Possible early bony fusion however pseudarthrosis not excluded at this level. Marked right foraminal encroachment due to spurring. Moderate left foraminal encroachment due to spurring   L5-S1: Disc degeneration and spondylosis. Bilateral facet hypertrophy. Marked foraminal encroachment bilaterally with impingement of the L5 nerve root bilaterally.   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   Solid fusion L2-3 and L3-4. Possible pseudarthrosis  versus early fusion at L4-5.   Marked right foraminal encroachment at L4-5 with moderate left foraminal encroachment L4-5   Marked foraminal encroachment bilaterally L5-S1.     Electronically Signed   By: Anastasio Balsam M.D.   On: 05/11/2014 13:05  Imaging: No results found.   PMFS History: Patient Active Problem List   Diagnosis Date Noted   Acute postoperative anemia due to expected blood loss 11/24/2022   Status post total right knee replacement 11/21/2022   Chronic combined systolic and diastolic heart failure (HCC) 02/08/2022   Coronary artery disease    Erectile dysfunction due to arterial insufficiency 07/30/2020   Benign prostatic hyperplasia with urinary obstruction 06/14/2020   Urinary retention 06/14/2020   Post PTCA 10/12/2015   Abnormal cardiovascular stress test 10/11/2015   Atypical chest pain 10/11/2015   Past Medical History:  Diagnosis Date   Arthritis    "all over"   Cancer [facility])    Skin Cancer removed off back   CHF (congestive heart failure) (HCC)    Chronic lower back pain    Coronary artery disease    DES pRCA 10/12/15   Early cataracts, bilateral    Family history of adverse reaction to anesthesia    "son had ligament repair of elbow in 2016; woke up nauseated and disoriented"   Hypercholesterolemia    Hypertension    Migraine    Restless leg syndrome    Wears glasses     Family History  Problem Relation Age of Onset   Aneurysm Mother    Heart attack Father    Hypertension Father    CAD Father    Cancer Other     Past Surgical History:  Procedure Laterality Date   ANTERIOR CERVICAL DECOMP/DISCECTOMY FUSION  ~ 2005   ANTERIOR CERVICAL DISCECTOMY     BACK SURGERY     CARDIAC CATHETERIZATION N/A 10/12/2015   Procedure: Left Heart Cath and Coronary Angiography;  Surgeon: Knox Perl, MD;  Location: [facility] INVASIVE CV LAB;  Service: Cardiovascular;  Laterality: N/A;   CARDIAC CATHETERIZATION  10/12/2015   Procedure: Coronary Stent  Intervention;  Surgeon: Knox Perl, MD;  Location: [facility] INVASIVE CV LAB;  Service: Cardiovascular;;   COLONOSCOPY     CORONARY ANGIOPLASTY  10/12/2015   DES RCA   CYSTOSCOPY WITH INSERTION OF UROLIFT N/A 05/02/2021   Procedure: CYSTOSCOPY WITH INSERTION OF UROLIFT;  Surgeon: Marco Severs, MD;  Location: [facility] OR[facility];  Service: Urology;  Laterality: N/A;   EYE SURGERY Left    cataract   FRACTURE SURGERY     nose repaired from accident   GLAUCOMA SURGERY Left 03/04/2015   JOINT REPLACEMENT     KNEE ARTHROSCOPY Bilateral    LEFT HEART CATH AND CORONARY ANGIOGRAPHY N/A 01/17/2022   Procedure: LEFT HEART CATH AND CORONARY ANGIOGRAPHY;  Surgeon: Knox Perl, MD;  Location: [facility]  INVASIVE CV LAB;  Service: Cardiovascular;  Laterality: N/A;   LUMBAR DISC SURGERY  ~ 1986-02/08/2007 X 6   MAXIMUM ACCESS (MAS)POSTERIOR LUMBAR INTERBODY FUSION (PLIF) 3 LEVEL  02/08/2007   w/rods and screws   RIGHT HEART CATH N/A 02/21/2022   Procedure: RIGHT HEART CATH;  Surgeon: Darlis Eisenmenger, MD;  Location: [facility] INVASIVE CV LAB;  Service: Cardiovascular;  Laterality: N/A;   SPINAL CORD STIMULATOR INSERTION N/A 05/08/2014   Procedure: LUMBAR SPINAL CORD STIMULATOR IMPLANT ;  Surgeon: Darryl Endow, MD;  Location: [facility] NEURO ORS;  Service: Neurosurgery;  Laterality: N/A;   TOE FUSION Right 06/02/2014   "outside part of big toe"   TOTAL KNEE ARTHROPLASTY Left ~ 2011   TOTAL KNEE ARTHROPLASTY Right 11/21/2022   Procedure: RIGHT TOTAL KNEE ARTHROPLASTY;  Surgeon: Arnie Lao, MD;  Location: [facility] OR;  Service: Orthopedics;  Laterality: Right;   Social History   Occupational History   Not on file  Tobacco Use   Smoking status: Never   Smokeless tobacco: Never  Vaping Use   Vaping status: Never Used  Substance and Sexual Activity   Alcohol use: Yes    Alcohol/week: 4.0 - 5.0 standard drinks of alcohol    Types: 4 - 5 Glasses of wine per week   Drug use: No   Sexual activity: Not Currently

## 2023-12-03 ENCOUNTER — Telehealth: Payer: Self-pay

## 2023-12-03 ENCOUNTER — Encounter: Payer: Self-pay | Admitting: Urology

## 2023-12-03 ENCOUNTER — Ambulatory Visit: Admitting: Urology

## 2023-12-03 VITALS — BP 155/68 | HR 64

## 2023-12-03 DIAGNOSIS — L570 Actinic keratosis: Secondary | ICD-10-CM | POA: Insufficient documentation

## 2023-12-03 DIAGNOSIS — R109 Unspecified abdominal pain: Secondary | ICD-10-CM | POA: Diagnosis not present

## 2023-12-03 DIAGNOSIS — F32A Depression, unspecified: Secondary | ICD-10-CM | POA: Insufficient documentation

## 2023-12-03 DIAGNOSIS — N401 Enlarged prostate with lower urinary tract symptoms: Secondary | ICD-10-CM | POA: Diagnosis not present

## 2023-12-03 DIAGNOSIS — F411 Generalized anxiety disorder: Secondary | ICD-10-CM | POA: Insufficient documentation

## 2023-12-03 DIAGNOSIS — N529 Male erectile dysfunction, unspecified: Secondary | ICD-10-CM | POA: Insufficient documentation

## 2023-12-03 DIAGNOSIS — E7800 Pure hypercholesterolemia, unspecified: Secondary | ICD-10-CM | POA: Insufficient documentation

## 2023-12-03 DIAGNOSIS — N138 Other obstructive and reflux uropathy: Secondary | ICD-10-CM

## 2023-12-03 DIAGNOSIS — I700 Atherosclerosis of aorta: Secondary | ICD-10-CM | POA: Insufficient documentation

## 2023-12-03 DIAGNOSIS — H25811 Combined forms of age-related cataract, right eye: Secondary | ICD-10-CM | POA: Insufficient documentation

## 2023-12-03 DIAGNOSIS — M4802 Spinal stenosis, cervical region: Secondary | ICD-10-CM | POA: Insufficient documentation

## 2023-12-03 DIAGNOSIS — H259 Unspecified age-related cataract: Secondary | ICD-10-CM | POA: Insufficient documentation

## 2023-12-03 DIAGNOSIS — G4730 Sleep apnea, unspecified: Secondary | ICD-10-CM | POA: Insufficient documentation

## 2023-12-03 DIAGNOSIS — E66811 Obesity, class 1: Secondary | ICD-10-CM | POA: Insufficient documentation

## 2023-12-03 DIAGNOSIS — E785 Hyperlipidemia, unspecified: Secondary | ICD-10-CM | POA: Insufficient documentation

## 2023-12-03 DIAGNOSIS — R0683 Snoring: Secondary | ICD-10-CM | POA: Insufficient documentation

## 2023-12-03 DIAGNOSIS — H40013 Open angle with borderline findings, low risk, bilateral: Secondary | ICD-10-CM | POA: Insufficient documentation

## 2023-12-03 DIAGNOSIS — I10 Essential (primary) hypertension: Secondary | ICD-10-CM | POA: Insufficient documentation

## 2023-12-03 DIAGNOSIS — R7303 Prediabetes: Secondary | ICD-10-CM | POA: Insufficient documentation

## 2023-12-03 DIAGNOSIS — R519 Headache, unspecified: Secondary | ICD-10-CM | POA: Insufficient documentation

## 2023-12-03 DIAGNOSIS — I119 Hypertensive heart disease without heart failure: Secondary | ICD-10-CM | POA: Insufficient documentation

## 2023-12-03 DIAGNOSIS — M4726 Other spondylosis with radiculopathy, lumbar region: Secondary | ICD-10-CM | POA: Insufficient documentation

## 2023-12-03 DIAGNOSIS — H26492 Other secondary cataract, left eye: Secondary | ICD-10-CM | POA: Insufficient documentation

## 2023-12-03 DIAGNOSIS — G4700 Insomnia, unspecified: Secondary | ICD-10-CM | POA: Insufficient documentation

## 2023-12-03 DIAGNOSIS — G2581 Restless legs syndrome: Secondary | ICD-10-CM | POA: Insufficient documentation

## 2023-12-03 DIAGNOSIS — K5901 Slow transit constipation: Secondary | ICD-10-CM | POA: Insufficient documentation

## 2023-12-03 HISTORY — DX: Spinal stenosis, cervical region: M48.02

## 2023-12-03 LAB — URINALYSIS, ROUTINE W REFLEX MICROSCOPIC
Bilirubin, UA: NEGATIVE
Ketones, UA: NEGATIVE
Leukocytes,UA: NEGATIVE
Nitrite, UA: NEGATIVE
RBC, UA: NEGATIVE
Specific Gravity, UA: 1.02 (ref 1.005–1.030)
Urobilinogen, Ur: 0.2 mg/dL (ref 0.2–1.0)
pH, UA: 5.5 (ref 5.0–7.5)

## 2023-12-03 NOTE — Progress Notes (Signed)
 Name: Brian Gallegos DOB: 31-Aug-1945 MRN: [id number]  History of Present Illness: Brian Gallegos is a 78 y.o. male who presents today for follow up visit at [facility]. GU History includes: 1. BPH with LUTS (frequency, nocturia). - 05/02/2021: Urolift procedure by Dr. Claretta Croft. - Taking Flomax  (Tamsulosin ) 0.4 mg daily. - No PSA results found per chart review. 2. Kidney stones. 3. Erectile dysfunction. - Failed Sildenafil.  - Failed Trimix for intracorporeal injections (caused painful erection).   At last visit with Dr. Claretta Croft on 08/29/2023: Poor response to Tadalafil  5 mg daily and 20 mg PRN. "He cannot maintain his erection." The plan was "We will trial Muse  100mcg prn".    Today: He reports concern for possible kidney stone. Denies prior history of nephrolithiasis. States that about 8 days ago he started having a sharp stabbing pain in his left flank area "right under my left rib cage".   He reports that he was seen at an urgent care on Friday 11/30/2023 where he was told he may have a stone or UTI and was prescribed Hydrocodone , a steroid, and Cipro  500 mg BID. After starting those medications he developed increased frequency and reports that after about 6 hours pain started to improve. He was later informed that his urine culture came back negative.  Today he states the pain is better but still present (3/10). Denies abdominal pain, fevers, nausea, or vomiting. He denies dysuria, gross hematuria, hesitancy, straining to void, or sensations of incomplete emptying. Denies any acute cardiac symptoms - no sweating, dizziness, shortness of breath, weakness. Reports possible transient increase in blood pressure which he attributes to the pain itself. Denies any associated pain with breathing.   He plays golf twice per week and reports that he had played golf prior to the pain starting, however he states this pain doesn't feel like a pulled muscle.   He had a lumbar  spine x-ray on 11/30/2023 - no nephrolithiasis appreciated on that study per urology provider review of those images today.    Medications: Current Outpatient Medications  Medication Sig Dispense Refill   aspirin  81 MG chewable tablet CHEW AND SWALLOW 1 TABLET(81 MG) BY MOUTH TWICE DAILY 30 tablet 0   bisoprolol  (ZEBETA ) 5 MG tablet Take 0.5 tablets (2.5 mg total) by mouth daily. 45 tablet 3   Cholecalciferol  25 MCG (1000 UT) tablet Take 1,000 Units by mouth in the morning.     ciprofloxacin  (CIPRO ) 500 MG tablet Take 500 mg by mouth 2 (two) times daily.     diclofenac  Sodium (VOLTAREN ) 1 % GEL Apply 1 Application topically as needed (pain).     DULoxetine  (CYMBALTA ) 20 MG capsule TAKE 1 CAPSULE(20 MG) BY MOUTH DAILY 30 capsule 3   empagliflozin  (JARDIANCE ) 10 MG TABS tablet TAKE 1 TABLET(10 MG) BY MOUTH EVERY MORNING 30 tablet 11   furosemide  (LASIX ) 40 MG tablet TAKE 1 TABLET(40 MG) BY MOUTH DAILY 60 tablet 0   gabapentin  (NEURONTIN ) 300 MG capsule TAKE 1 CAPSULE(300 MG) BY MOUTH AT BEDTIME 60 capsule 1   HYDROcodone -acetaminophen  (NORCO/VICODIN) 5-325 MG tablet Take 1 tablet by mouth daily as needed for moderate pain (pain score 4-6).     Ibuprofen-Acetaminophen  (ADVIL DUAL ACTION) 125-250 MG TABS Take 2 tablets by mouth every 8 (eight) hours as needed (pain).     methocarbamol  (ROBAXIN ) 500 MG tablet TAKE 1 TABLET(500 MG) BY MOUTH EVERY 6 HOURS AS NEEDED FOR MUSCLE SPASMS 30 tablet 0   oxymetazoline (AFRIN) 0.05 % nasal  spray Place 1 spray into both nostrils 2 (two) times daily as needed for congestion.     predniSONE (STERAPRED UNI-PAK 21 TAB) 10 MG (21) TBPK tablet Take 10 mg by mouth as directed.     pyridoxine  (B-6) 100 MG tablet Take 100 mg by mouth in the morning.     rOPINIRole  (REQUIP ) 2 MG tablet Take 4 mg by mouth See admin instructions. Take 4 mg in the afternoon and 4 mg at bedtime, may take a third 4 mg dose as needed for restless legs     rosuvastatin  (CRESTOR ) 40 MG tablet  Take 20 mg by mouth at bedtime.     sacubitril -valsartan  (ENTRESTO ) 49-51 MG TAKE 1 TABLET BY MOUTH TWICE DAILY 60 tablet 3   spironolactone  (ALDACTONE ) 25 MG tablet TAKE 1 TABLET(25 MG) BY MOUTH DAILY 30 tablet 1   tadalafil  (CIALIS ) 20 MG tablet TAKE 1 TABLET BY MOUTH ONCE DAILY AS NEEDED FOR ERECTILE DYSFUNCTION 10 tablet 0   tadalafil  (CIALIS ) 5 MG tablet Take 1 tablet (5 mg total) by mouth at bedtime. 30 tablet 11   tamsulosin  (FLOMAX ) 0.4 MG CAPS capsule Take 1 capsule (0.4 mg total) by mouth in the morning. 30 capsule 11   traZODone  (DESYREL ) 100 MG tablet Take 100 mg by mouth at bedtime.     vitamin C  (ASCORBIC ACID ) 250 MG tablet Take 250 mg by mouth daily.     No current facility-administered medications for this visit.    Allergies: Allergies  Allergen Reactions   Dulcolax [Bisacodyl] Other (See Comments)    Severe stomach cramps   Codeine Itching    Low amount is tolerated   Proscar [Finasteride] Hives and Rash    Past Medical History:  Diagnosis Date   Arthritis    "all over"   Cancer [facility])    Skin Cancer removed off back   CHF (congestive heart failure) (HCC)    Chronic lower back pain    Coronary artery disease    DES pRCA 10/12/15   Early cataracts, bilateral    Family history of adverse reaction to anesthesia    "son had ligament repair of elbow in 2016; woke up nauseated and disoriented"   Hypercholesterolemia    Hypertension    Migraine    Restless leg syndrome    Spinal stenosis in cervical region 12/03/2023   Wears glasses    Past Surgical History:  Procedure Laterality Date   ANTERIOR CERVICAL DECOMP/DISCECTOMY FUSION  ~ 2005   ANTERIOR CERVICAL DISCECTOMY     BACK SURGERY     CARDIAC CATHETERIZATION N/A 10/12/2015   Procedure: Left Heart Cath and Coronary Angiography;  Surgeon: Knox Perl, MD;  Location: [facility] INVASIVE CV LAB;  Service: Cardiovascular;  Laterality: N/A;   CARDIAC CATHETERIZATION  10/12/2015   Procedure: Coronary Stent Intervention;   Surgeon: Knox Perl, MD;  Location: [facility] INVASIVE CV LAB;  Service: Cardiovascular;;   COLONOSCOPY     CORONARY ANGIOPLASTY  10/12/2015   DES RCA   CYSTOSCOPY WITH INSERTION OF UROLIFT N/A 05/02/2021   Procedure: CYSTOSCOPY WITH INSERTION OF UROLIFT;  Surgeon: Marco Severs, MD;  Location: [facility];  Service: Urology;  Laterality: N/A;   EYE SURGERY Left    cataract   FRACTURE SURGERY     nose repaired from accident   GLAUCOMA SURGERY Left 03/04/2015   JOINT REPLACEMENT     KNEE ARTHROSCOPY Bilateral    LEFT HEART CATH AND CORONARY ANGIOGRAPHY N/A 01/17/2022   Procedure: LEFT HEART CATH  AND CORONARY ANGIOGRAPHY;  Surgeon: Knox Perl, MD;  Location: [facility] INVASIVE CV LAB;  Service: Cardiovascular;  Laterality: N/A;   LUMBAR DISC SURGERY  ~ 1986-02/08/2007 X 6   MAXIMUM ACCESS (MAS)POSTERIOR LUMBAR INTERBODY FUSION (PLIF) 3 LEVEL  02/08/2007   w/rods and screws   RIGHT HEART CATH N/A 02/21/2022   Procedure: RIGHT HEART CATH;  Surgeon: Darlis Eisenmenger, MD;  Location: [facility] INVASIVE CV LAB;  Service: Cardiovascular;  Laterality: N/A;   SPINAL CORD STIMULATOR INSERTION N/A 05/08/2014   Procedure: LUMBAR SPINAL CORD STIMULATOR IMPLANT ;  Surgeon: Darryl Endow, MD;  Location: [facility] NEURO ORS;  Service: Neurosurgery;  Laterality: N/A;   TOE FUSION Right 06/02/2014   "outside part of big toe"   TOTAL KNEE ARTHROPLASTY Left ~ 2011   TOTAL KNEE ARTHROPLASTY Right 11/21/2022   Procedure: RIGHT TOTAL KNEE ARTHROPLASTY;  Surgeon: Arnie Lao, MD;  Location: [facility] OR;  Service: Orthopedics;  Laterality: Right;   Family History  Problem Relation Age of Onset   Aneurysm Mother    Heart attack Father    Hypertension Father    CAD Father    Cancer Other    Social History   Socioeconomic History   Marital status: Widowed    Spouse name: Not on file   Number of children: 2   Years of education: Not on file   Highest education level: Not on file  Occupational History   Not on file  Tobacco Use    Smoking status: Never   Smokeless tobacco: Never  Vaping Use   Vaping status: Never Used  Substance and Sexual Activity   Alcohol use: Yes    Alcohol/week: 4.0 - 5.0 standard drinks of alcohol    Types: 4 - 5 Glasses of wine per week   Drug use: No   Sexual activity: Not Currently  Other Topics Concern   Not on file  Social History Narrative   Not on file   Social Drivers of Health   Financial Resource Strain: Not on file  Food Insecurity: Not on file  Transportation Needs: Not on file  Physical Activity: Not on file  Stress: Not on file  Social Connections: Not on file  Intimate Partner Violence: Not on file    SUBJECTIVE  Review of Systems Constitutional: Patient denies any unintentional weight loss or change in strength lntegumentary: Patient denies any rashes or pruritus Cardiovascular: Patient denies chest pain or syncope Respiratory: Patient denies shortness of breath Gastrointestinal: As per HPI Musculoskeletal: As per HPI Neurologic: Patient denies convulsions or seizures Allergic/Immunologic: Patient denies recent allergic reaction(s) Hematologic/Lymphatic: Patient denies bleeding tendencies Endocrine: Patient denies heat/cold intolerance  GU: As per HPI.  OBJECTIVE Vitals:   12/03/23 1127  BP: (!) 155/68  Pulse: 64   There is no height or weight on file to calculate BMI.  Physical Examination Constitutional: No obvious distress; patient is non-toxic appearing  Cardiovascular: No visible lower extremity edema.  Respiratory: The patient does not have audible wheezing/stridor; respirations do not appear labored  Gastrointestinal: Abdomen non-distended Musculoskeletal: Normal ROM of UEs  Skin: No obvious rashes/open sores  Neurologic: CN 2-12 grossly intact Psychiatric: Answered questions appropriately with normal affect  Hematologic/Lymphatic/Immunologic: No obvious bruises or sites of spontaneous bleeding  UA: glucosuria (secondary to Jardiance   use), otherwise unremarkable  PVR: 31 ml  ASSESSMENT Left flank pain - Plan: Urinalysis, Routine w reflex microscopic, BLADDER SCAN AMB NON-IMAGING, CT RENAL STONE STUDY  Benign prostatic hyperplasia with urinary obstruction  Abdominal  pain, unspecified abdominal location - Plan: CT RENAL STONE STUDY  We discussed possible etiologies for his left flank / rib pain such as GU stone, muscle strain, pinched nerve, etc. Suspect musculoskeletal etiology based on history however we agreed to obtain CT stone for further evaluation. Will notify him of recommendations based on results. Will plan for follow up as previously scheduled with Dr. Claretta Croft on 02/29/2024 or sooner if needed. Pt verbalized understanding and agreement. All questions were answered.  PLAN Advised the following: CT stone. Return in 3 months (on 02/29/2024) for as previously scheduled with Dr. Claretta Croft.  Orders Placed This Encounter  Procedures   CT RENAL STONE STUDY    Standing Status:   Future    Expiration Date:   12/02/2024    Preferred imaging location?:   [facility]   Urinalysis, Routine w reflex microscopic   BLADDER SCAN AMB NON-IMAGING   Total time spent caring for the patient today was over 30 minutes. This includes time spent on the date of the visit reviewing the patient's chart before the visit, time spent during the visit, and time spent after the visit on documentation. Over 50% of that time was spent in face-to-face time with this patient for direct counseling. E&M based on time and complexity of medical decision making.  It has been explained that the patient is to follow regularly with their PCP in addition to all other providers involved in their care and to follow instructions provided by these respective offices. Patient advised to contact urology clinic if any urologic-pertaining questions, concerns, new symptoms or problems arise in the interim period.  There are no Patient Instructions on file  for this visit.  Electronically signed by:  Lauretta Ponto, MSN, FNP-C, CUNP 12/03/2023 12:11 PM

## 2023-12-03 NOTE — Telephone Encounter (Signed)
 Tried return call to patient about voiced message regarding Glucose in UA.

## 2023-12-05 ENCOUNTER — Ambulatory Visit (HOSPITAL_BASED_OUTPATIENT_CLINIC_OR_DEPARTMENT_OTHER)
Admission: RE | Admit: 2023-12-05 | Discharge: 2023-12-05 | Disposition: A | Discharge status: Home / Self Care | Source: Ambulatory Visit | Attending: Urology | Admitting: Urology

## 2023-12-05 DIAGNOSIS — R109 Unspecified abdominal pain: Secondary | ICD-10-CM | POA: Diagnosis not present

## 2023-12-05 DIAGNOSIS — N400 Enlarged prostate without lower urinary tract symptoms: Secondary | ICD-10-CM | POA: Diagnosis not present

## 2023-12-05 DIAGNOSIS — I714 Abdominal aortic aneurysm, without rupture, unspecified: Secondary | ICD-10-CM | POA: Diagnosis not present

## 2023-12-05 DIAGNOSIS — Q632 Ectopic kidney: Secondary | ICD-10-CM | POA: Diagnosis not present

## 2023-12-06 ENCOUNTER — Ambulatory Visit: Payer: Self-pay | Admitting: Urology

## 2023-12-08 ENCOUNTER — Encounter (HOSPITAL_COMMUNITY): Payer: Self-pay | Admitting: Cardiology

## 2023-12-11 ENCOUNTER — Telehealth: Payer: Self-pay | Admitting: Urology

## 2023-12-11 ENCOUNTER — Telehealth: Payer: Self-pay

## 2023-12-11 DIAGNOSIS — R109 Unspecified abdominal pain: Secondary | ICD-10-CM | POA: Diagnosis not present

## 2023-12-11 DIAGNOSIS — Z6830 Body mass index (BMI) 30.0-30.9, adult: Secondary | ICD-10-CM | POA: Diagnosis not present

## 2023-12-11 MED ORDER — CYCLOBENZAPRINE HCL 5 MG PO TABS: 5.0000 mg | ORAL_TABLET | Freq: Three times a day (TID) | ORAL | 0 refills | Status: DC | PRN

## 2023-12-11 NOTE — Telephone Encounter (Signed)
-----   Message from Johnie Nailer sent at 12/11/2023  9:26 AM EDT ----- Regarding: RE: follow up question Please send cyclobenzapine 5mg  TID PRN #30 with no refills ----- Message ----- From: Audra Blend, CMA Sent: 12/06/2023   4:34 PM EDT To: Marco Severs, MD Subject: follow up question                             Please see below and advised.  "I would like to talk to the doctor about this. I am happy nothing showed on the scan but I still have pain at the left kidney area. I can't lie in bed to sleep because of the pain. For about 2 weeks now I have slept in my recliner. I woukd like to talk to Dr Claretta Croft for suggestions on what to do next. "  Thank you   Hinda Lucy

## 2023-12-11 NOTE — Telephone Encounter (Signed)
 Wants a call from the manager. He does not think he is getting the care he should.

## 2023-12-11 NOTE — Telephone Encounter (Signed)
 Patient is made aware via  detailed voiced message "Please send cyclobenzapine 5mg  TID PRN #30 with no refills"

## 2023-12-18 ENCOUNTER — Other Ambulatory Visit: Payer: Self-pay | Admitting: Urology

## 2024-01-15 NOTE — Telephone Encounter (Signed)
 Left message for patient to return call to office.

## 2024-01-16 ENCOUNTER — Ambulatory Visit (HOSPITAL_BASED_OUTPATIENT_CLINIC_OR_DEPARTMENT_OTHER)
Admission: RE | Admit: 2024-01-16 | Discharge: 2024-01-16 | Disposition: A | Discharge status: Home / Self Care | Source: Ambulatory Visit | Attending: Cardiology | Admitting: Cardiology

## 2024-01-16 ENCOUNTER — Encounter (HOSPITAL_COMMUNITY): Payer: Self-pay | Admitting: Cardiology

## 2024-01-16 ENCOUNTER — Ambulatory Visit (HOSPITAL_COMMUNITY)
Admission: RE | Admit: 2024-01-16 | Discharge: 2024-01-16 | Disposition: A | Discharge status: Home / Self Care | Payer: Self-pay | Source: Ambulatory Visit | Attending: Cardiology | Admitting: Cardiology

## 2024-01-16 VITALS — BP 142/62 | HR 72 | Ht 69.0 in | Wt 199.2 lb

## 2024-01-16 DIAGNOSIS — Z7984 Long term (current) use of oral hypoglycemic drugs: Secondary | ICD-10-CM | POA: Insufficient documentation

## 2024-01-16 DIAGNOSIS — Z1283 Encounter for screening for malignant neoplasm of skin: Secondary | ICD-10-CM | POA: Diagnosis not present

## 2024-01-16 DIAGNOSIS — I110 Hypertensive heart disease with heart failure: Secondary | ICD-10-CM | POA: Insufficient documentation

## 2024-01-16 DIAGNOSIS — L570 Actinic keratosis: Secondary | ICD-10-CM | POA: Diagnosis not present

## 2024-01-16 DIAGNOSIS — I2510 Atherosclerotic heart disease of native coronary artery without angina pectoris: Secondary | ICD-10-CM | POA: Insufficient documentation

## 2024-01-16 DIAGNOSIS — E785 Hyperlipidemia, unspecified: Secondary | ICD-10-CM | POA: Diagnosis not present

## 2024-01-16 DIAGNOSIS — Z955 Presence of coronary angioplasty implant and graft: Secondary | ICD-10-CM | POA: Insufficient documentation

## 2024-01-16 DIAGNOSIS — I5042 Chronic combined systolic (congestive) and diastolic (congestive) heart failure: Secondary | ICD-10-CM

## 2024-01-16 DIAGNOSIS — D225 Melanocytic nevi of trunk: Secondary | ICD-10-CM | POA: Diagnosis not present

## 2024-01-16 DIAGNOSIS — Z08 Encounter for follow-up examination after completed treatment for malignant neoplasm: Secondary | ICD-10-CM | POA: Diagnosis not present

## 2024-01-16 DIAGNOSIS — X32XXXD Exposure to sunlight, subsequent encounter: Secondary | ICD-10-CM | POA: Diagnosis not present

## 2024-01-16 DIAGNOSIS — I5022 Chronic systolic (congestive) heart failure: Secondary | ICD-10-CM | POA: Insufficient documentation

## 2024-01-16 DIAGNOSIS — Z006 Encounter for examination for normal comparison and control in clinical research program: Secondary | ICD-10-CM

## 2024-01-16 DIAGNOSIS — Z85820 Personal history of malignant melanoma of skin: Secondary | ICD-10-CM | POA: Diagnosis not present

## 2024-01-16 LAB — ECHOCARDIOGRAM COMPLETE
Area-P 1/2: 3.42 cm2
Calc EF: 65.8 %
S' Lateral: 4.1 cm
Single Plane A2C EF: 69.9 %
Single Plane A4C EF: 60 %

## 2024-01-16 MED ORDER — FUROSEMIDE 40 MG PO TABS: 20.0000 mg | ORAL_TABLET | Freq: Every day | ORAL | 0 refills | Status: DC

## 2024-01-16 MED ORDER — SACUBITRIL-VALSARTAN 97-103 MG PO TABS: 1.0000 | ORAL_TABLET | Freq: Two times a day (BID) | ORAL | 2 refills | Status: DC

## 2024-01-16 MED ORDER — ROSUVASTATIN CALCIUM 40 MG PO TABS
40.0000 mg | ORAL_TABLET | Freq: Every day | ORAL | 4 refills | Status: AC
Start: 2024-01-16 — End: ?

## 2024-01-16 NOTE — Patient Instructions (Signed)
 Good to see you today!  INCREASE Entresto  to 97/103 mg Twice daily  DECREASE lasix  to 20 mg  daily  INCREASE Crestor  to 40 mg  daily  Lab work in 10 days as scheduled and also in 2 months  Your physician recommends that you schedule a follow-up appointment in: 6 months(January) Call office  in November to schedule an appointment  If you have any questions or concerns before your next appointment please send us  a message through [place] or call our office at [phone].    TO LEAVE A MESSAGE FOR THE NURSE SELECT OPTION 2, PLEASE LEAVE A MESSAGE INCLUDING: YOUR NAME DATE OF BIRTH CALL BACK NUMBER REASON FOR CALL**this is important as we prioritize the call backs  YOU WILL RECEIVE A CALL BACK THE SAME DAY AS LONG AS YOU CALL BEFORE 4:00 PM At the [facility], you and your health needs are our priority. As part of our continuing mission to provide you with exceptional heart care, we have created designated Provider Care Teams. These Care Teams include your primary Cardiologist (physician) and Advanced Practice Providers (APPs- Physician Assistants and Nurse Practitioners) who all work together to provide you with the care you need, when you need it.   You may see any of the following providers on your designated Care Team at your next follow up: Dr Toribio Fuel Dr Ezra Shuck Dr. Ria Commander Dr. Morene Brownie Amy Lenetta, NP Caffie Shed, [place] [facility] [place], [place] Beckey Coe, NP [place] Lee, NP Ellouise Class, NP Tinnie Redman, PharmD Jaun Bash, PharmD   Please be sure to bring in all your medications bottles to every appointment.    Thank you for choosing [facility]

## 2024-01-16 NOTE — Research (Signed)
 SITE: [id number]     Subject # [id number]   Subprotocol: A  Inclusion Criteria  Patients who meet all of the following criteria are eligible for enrollment as study participants:  Yes No  Age > 78 years old X   Eligible to wear Holter Study X    Exclusion Criteria  Patients who meet any of these criteria are not eligible for enrollment as study participants: Yes No  1. Receiving any mechanical (respiratory or circulatory) or renal support therapy at Screening or during Visit #1.  X  2.  Any other conditions that in the opinion of the investigators are likely to prevent compliance with the study protocol or pose a safety concern if the subject participates in the study.  X  3. Poor tolerance, namely susceptible to severe skin allergies from ECG adhesive patch application.  X   Protocol: REV H    60 minute start window         Cor device must be applied, and the study initiated, no later than 60 minutes of completing the Echocardiogram                             HH:MM  Echo completion time  14:31  2.   Cor Study start time  14:57   30-Minute execution window  Once Cor Monitoring begins, 3 QT Med ECGs and the 15-minute rest period must be completed within a 30 minute window     HH:MM  3. QT Med ECG Completion time    RMA-C  4. Start of 15-Min sitting rest period     RMA-C  5. End of 15-Min rest period     RMA-C  6. Time of device removal     RMA-C   *Continue to use the Mobile App Event feature to log the Rest period windows and follow instructions on the EF-ACT Clinical Trial  Patient Instruction Card.  Describe any anomalies in Protocol execution in the Protocol Deviation Log    Residential Zip code [phone] (First 3 digits ONLY)                                           [facility] Informed Consent   Subject Name: Brian Gallegos  Subject met inclusion and exclusion criteria.  The informed consent form, study requirements and expectations were reviewed with the subject. Subject had  opportunity to read consent and questions and concerns were addressed prior to the signing of the consent form.  The subject verbalized understanding of the trial requirements.  The subject agreed to participate in the [facility] EF ACT trial and signed the informed consent at 14:56 on 16-Jan-2024.  The informed consent was obtained prior to performance of any protocol-specific procedures for the subject.  A copy of the signed informed consent was given to the subject and a copy was placed in the subject's medical record.   [name]          Current Outpatient Medications:    aspirin  81 MG chewable tablet, CHEW AND SWALLOW 1 TABLET(81 MG) BY MOUTH TWICE DAILY, Disp: 30 tablet, Rfl: 0   bisoprolol  (ZEBETA ) 5 MG tablet, Take 0.5 tablets (2.5 mg total) by mouth daily., Disp: 45 tablet, Rfl: 3   Cholecalciferol  25 MCG (1000 UT) tablet, Take 1,000 Units by mouth in the morning., Disp: ,  Rfl:    ciprofloxacin  (CIPRO ) 500 MG tablet, Take 500 mg by mouth 2 (two) times daily., Disp: , Rfl:    diclofenac  Sodium (VOLTAREN ) 1 % GEL, Apply 1 Application topically as needed (pain)., Disp: , Rfl:    DULoxetine  (CYMBALTA ) 20 MG capsule, TAKE 1 CAPSULE(20 MG) BY MOUTH DAILY, Disp: 30 capsule, Rfl: 3   empagliflozin  (JARDIANCE ) 10 MG TABS tablet, TAKE 1 TABLET(10 MG) BY MOUTH EVERY MORNING, Disp: 30 tablet, Rfl: 11   furosemide  (LASIX ) 40 MG tablet, Take 0.5 tablets (20 mg total) by mouth daily., Disp: 60 tablet, Rfl: 0   gabapentin  (NEURONTIN ) 300 MG capsule, TAKE 1 CAPSULE(300 MG) BY MOUTH AT BEDTIME, Disp: 60 capsule, Rfl: 1   Ibuprofen-Acetaminophen  (ADVIL DUAL ACTION) 125-250 MG TABS, Take 2 tablets by mouth every 8 (eight) hours as needed (pain)., Disp: , Rfl:    methocarbamol  (ROBAXIN ) 500 MG tablet, TAKE 1 TABLET(500 MG) BY MOUTH EVERY 6 HOURS AS NEEDED FOR MUSCLE SPASMS, Disp: 30 tablet, Rfl: 0   oxymetazoline (AFRIN) 0.05 % nasal spray, Place 1 spray into both nostrils 2 (two) times daily as needed  for congestion., Disp: , Rfl:    pyridoxine  (B-6) 100 MG tablet, Take 100 mg by mouth in the morning., Disp: , Rfl:    rOPINIRole  (REQUIP ) 2 MG tablet, Take 4 mg by mouth See admin instructions. Take 4 mg in the afternoon and 4 mg at bedtime, may take a third 4 mg dose as needed for restless legs, Disp: , Rfl:    rosuvastatin  (CRESTOR ) 40 MG tablet, Take 1 tablet (40 mg total) by mouth at bedtime., Disp: 60 tablet, Rfl: 4   sacubitril -valsartan  (ENTRESTO ) 97-103 MG, Take 1 tablet by mouth 2 (two) times daily., Disp: 60 tablet, Rfl: 2   spironolactone  (ALDACTONE ) 25 MG tablet, TAKE 1 TABLET(25 MG) BY MOUTH DAILY, Disp: 30 tablet, Rfl: 1   tadalafil  (CIALIS ) 20 MG tablet, TAKE 1 TABLET BY MOUTH ONCE DAILY AS NEEDED FOR ERECTILE DYSFUNCTION, Disp: 10 tablet, Rfl: 0   tadalafil  (CIALIS ) 5 MG tablet, Take 1 tablet (5 mg total) by mouth at bedtime., Disp: 30 tablet, Rfl: 11   tamsulosin  (FLOMAX ) 0.4 MG CAPS capsule, Take 1 capsule (0.4 mg total) by mouth in the morning., Disp: 30 capsule, Rfl: 11   traZODone  (DESYREL ) 100 MG tablet, Take 100 mg by mouth at bedtime., Disp: , Rfl:    vitamin C  (ASCORBIC ACID ) 250 MG tablet, Take 250 mg by mouth daily., Disp: , Rfl:

## 2024-01-17 NOTE — Progress Notes (Signed)
 [facility] CLINIC NOTE PCP: Cleotilde Planas, MD HF Cardiology: Dr. Rolan  Reason for Visit: Heart Failure Follow-up  HPI: Brian Gallegos is a 78 y.o. with history of HTN, hyperlipidemia, and CAD was referred by Dr. Michele for evaluation of CHF.  Patient had a cath in 4/17 with DES to 90% proximal RCA stenosis.  Echoes after that time showed mildly decreased systolic function in the 45-50% range.  He was generally asymptomatic, however, until around 6/23.  In 6/23, he developed progressive dyspnea and peripheral edema.  Echo in 6/23 showed EF 40-45%, mild LVH, grade 2 diastolic dysfunction, RV normal, severe LAE, mild-moderate MR.  He was started on Bumex  which gave him headaches, this was switched to Lasix .  Peripheral edema improved but he has continued to feel short of breath with exertion.  No definite trigger for symptoms, no episode of prolonged chest pain or severe viral-type illness.  He had coronary angiography in 7/23, showing patent RCA stent and nonobstructive mild CAD. He drank heavily in the past but not in the last 5 years (3-4 glasses wine/week now). He has a family history of CAD (father at around 17) but not cardiomyopathy or sudden cardiac death.  He was noted to be volume overloaded and Lasix  was increased.   RHC in 8/23 showed normal filling pressures on higher Lasix  dose. CPX in 8/23 showed low normal functional capacity, no more than mild functional limitation due to heart failure.  PFTs were within normal limits.   S/P R TKR in 5/24.  Echo was done today and reviewed, EF 60-65%, mild LVH, normal RV size and systolic function, RV normal.   He returns today for heart failure follow up. Weight down 5 lbs. BP has been running high and is still mildly elevated today.  No dyspnea walking on flat ground or up a flight of stairs.  No chest pain.  Plays golf.  No orthostatic symptoms unless he plays golf in the heat.   ECG (personally reviewed): NSR, nonspecific T wave changes.    Labs (5/25): LDL 68, BNP 268, K 3.9, creatinine 1.07  PMH: 1. HTN 2. Hyperlipidemia 3. CAD: LHC in 4/17 with 90% pRCA treated with DES.  - LHC (7/23): Patent RCA stent, mild nonobstructive disease.  4. Chronic HF with mid range EF: ?Ischemic cardiomyopathy.  - Echo (3/18): EF 50%.  - Echo (7/21): EF 45-50%, septal HK, moderate MR - Echo (6/23): EF 40-45%, mild LVH, grade 2 diastolic dysfunction, RV normal, severe LAE, mild-moderate MR.  - RHC (8/23): mean RA 1, PA 33/13 mean 22, mean PCWP 11, CI 4.08 - CPX (8/23): peak VO2 18.9, VE/VCO2 slope 34, RER 1.22, normal PFTs => low normal functional status, no more than mild HF limitation.  - Echo (7/25): EF 60-65%, mild LVH, normal RV size and systolic function, RV normal.   SH: Married x 2, now widower.  Has 2 children.  Lives in [place].  Designer, television/film set.  Drinks 3-4 glasses wine/week. Nonsmoker though exposed to 2nd hand smoke.   FH: Father with MI in his 24s, mother with brain aneurysm in her 1s.   ROS: All systems reviewed and negative except as per HPI.   Current Outpatient Medications  Medication Sig Dispense Refill   aspirin  81 MG chewable tablet CHEW AND SWALLOW 1 TABLET(81 MG) BY MOUTH TWICE DAILY 30 tablet 0   bisoprolol  (ZEBETA ) 5 MG tablet Take 0.5 tablets (2.5 mg total) by mouth daily. 45 tablet 3   Cholecalciferol  25  MCG (1000 UT) tablet Take 1,000 Units by mouth in the morning.     ciprofloxacin  (CIPRO ) 500 MG tablet Take 500 mg by mouth 2 (two) times daily.     diclofenac  Sodium (VOLTAREN ) 1 % GEL Apply 1 Application topically as needed (pain).     DULoxetine  (CYMBALTA ) 20 MG capsule TAKE 1 CAPSULE(20 MG) BY MOUTH DAILY 30 capsule 3   empagliflozin  (JARDIANCE ) 10 MG TABS tablet TAKE 1 TABLET(10 MG) BY MOUTH EVERY MORNING 30 tablet 11   gabapentin  (NEURONTIN ) 300 MG capsule TAKE 1 CAPSULE(300 MG) BY MOUTH AT BEDTIME 60 capsule 1   Ibuprofen-Acetaminophen  (ADVIL DUAL ACTION) 125-250 MG TABS Take 2 tablets by mouth  every 8 (eight) hours as needed (pain).     methocarbamol  (ROBAXIN ) 500 MG tablet TAKE 1 TABLET(500 MG) BY MOUTH EVERY 6 HOURS AS NEEDED FOR MUSCLE SPASMS 30 tablet 0   oxymetazoline (AFRIN) 0.05 % nasal spray Place 1 spray into both nostrils 2 (two) times daily as needed for congestion.     pyridoxine  (B-6) 100 MG tablet Take 100 mg by mouth in the morning.     rOPINIRole  (REQUIP ) 2 MG tablet Take 4 mg by mouth See admin instructions. Take 4 mg in the afternoon and 4 mg at bedtime, may take a third 4 mg dose as needed for restless legs     sacubitril -valsartan  (ENTRESTO ) 97-103 MG Take 1 tablet by mouth 2 (two) times daily. 60 tablet 2   spironolactone  (ALDACTONE ) 25 MG tablet TAKE 1 TABLET(25 MG) BY MOUTH DAILY 30 tablet 1   tadalafil  (CIALIS ) 20 MG tablet TAKE 1 TABLET BY MOUTH ONCE DAILY AS NEEDED FOR ERECTILE DYSFUNCTION 10 tablet 0   tadalafil  (CIALIS ) 5 MG tablet Take 1 tablet (5 mg total) by mouth at bedtime. 30 tablet 11   tamsulosin  (FLOMAX ) 0.4 MG CAPS capsule Take 1 capsule (0.4 mg total) by mouth in the morning. 30 capsule 11   traZODone  (DESYREL ) 100 MG tablet Take 100 mg by mouth at bedtime.     vitamin C  (ASCORBIC ACID ) 250 MG tablet Take 250 mg by mouth daily.     furosemide  (LASIX ) 40 MG tablet Take 0.5 tablets (20 mg total) by mouth daily. 60 tablet 0   rosuvastatin  (CRESTOR ) 40 MG tablet Take 1 tablet (40 mg total) by mouth at bedtime. 60 tablet 4   No current facility-administered medications for this encounter.   Wt Readings from Last 3 Encounters:  01/16/24 90.4 kg (199 lb 3.2 oz)  11/27/23 92.5 kg (204 lb)  11/24/22 88.4 kg (194 lb 14.2 oz)   BP (!) 142/62   Pulse 72   Ht 5' 9 (1.753 m)   Wt 90.4 kg (199 lb 3.2 oz)   SpO2 97%   BMI 29.42 kg/m  General: NAD Neck: No JVD, no thyromegaly or thyroid  nodule.  Lungs: Clear to auscultation bilaterally with normal respiratory effort. CV: Nondisplaced PMI.  Heart regular S1/S2, no S3/S4, no murmur.  No peripheral  edema.  No carotid bruit.  Normal pedal pulses.  Abdomen: Soft, nontender, no hepatosplenomegaly, no distention.  Skin: Intact without lesions or rashes.  Neurologic: Alert and oriented x 3.  Psych: Normal affect. Extremities: No clubbing or cyanosis.  HEENT: Normal.   Assessment/Plan: 1. Chronic HF with mid range EF:  Most recent echo in 6/23 showed EF 40-45%, mild LVH, grade 2 diastolic dysfunction, RV normal, severe LAE, mild-moderate MR.  He has a history of CAD with PCI to proximal RCA in 2017,  but feel that CAD/ischemic cardiomyopathy is not a definite explanation for his cardiomyopathy/CHF. LHC in 7/23 showed patent RCA stent and nonobstructive mild CAD.  RHC in 8/23 after augmented diuresis showed normal filling pressures and cardiac output.  CPX in 8/23 showed only mild functional limitation from CHF.  Echo today showed EF up to 60-65%, mild LVH, normal RV size and systolic function, RV normal. NYHA class I, not volume overloaded on exam.  BP still high.  - Increase Entresto  to 97/103 bid with BMET today and again in 10 days.  - Decrease Lasix  to 20 mg daily.  - Continue bisoprolol  2.5 mg daily.  - Continue Jardiance  10 mg daily.  - Continue spironolactone  25 mg daily.  2. CAD: 2017 PCI to RCA.  Cath 7/23 with patent RCA stent and nonobstructive mild disease.  No chest pain.  - Continue ASA 81  - Increase Crestor  to 40 mg daily for goal LDL < 55.  Lipids/LFTs in 2 months.  3. HTN: BP still elevated, increasing Entresto  as above.   Follow up in 6 months with APP.   I spent 32 minutes reviewing records, interviewing/examining patient, and managing orders.   Ezra Shuck, MD 01/17/2024

## 2024-01-25 ENCOUNTER — Other Ambulatory Visit (HOSPITAL_COMMUNITY): Payer: Self-pay | Admitting: Cardiology

## 2024-01-25 DIAGNOSIS — I119 Hypertensive heart disease without heart failure: Secondary | ICD-10-CM

## 2024-01-25 DIAGNOSIS — I255 Ischemic cardiomyopathy: Secondary | ICD-10-CM

## 2024-01-29 ENCOUNTER — Other Ambulatory Visit (HOSPITAL_COMMUNITY)

## 2024-01-30 ENCOUNTER — Ambulatory Visit (HOSPITAL_COMMUNITY)
Admission: RE | Admit: 2024-01-30 | Discharge: 2024-01-30 | Disposition: A | Discharge status: Home / Self Care | Source: Ambulatory Visit | Attending: Cardiology | Admitting: Cardiology

## 2024-01-30 ENCOUNTER — Encounter (HOSPITAL_COMMUNITY): Payer: Self-pay

## 2024-01-30 DIAGNOSIS — I5042 Chronic combined systolic (congestive) and diastolic (congestive) heart failure: Secondary | ICD-10-CM | POA: Diagnosis not present

## 2024-02-28 ENCOUNTER — Other Ambulatory Visit: Payer: Self-pay | Admitting: Urology

## 2024-02-29 ENCOUNTER — Ambulatory Visit: Payer: Medicare PPO | Admitting: Urology

## 2024-02-29 ENCOUNTER — Encounter: Payer: Self-pay | Admitting: Urology

## 2024-02-29 VITALS — BP 105/52 | HR 60

## 2024-02-29 DIAGNOSIS — N138 Other obstructive and reflux uropathy: Secondary | ICD-10-CM

## 2024-02-29 DIAGNOSIS — N401 Enlarged prostate with lower urinary tract symptoms: Secondary | ICD-10-CM

## 2024-02-29 DIAGNOSIS — N5201 Erectile dysfunction due to arterial insufficiency: Secondary | ICD-10-CM | POA: Diagnosis not present

## 2024-02-29 DIAGNOSIS — R339 Retention of urine, unspecified: Secondary | ICD-10-CM

## 2024-02-29 LAB — URINALYSIS, ROUTINE W REFLEX MICROSCOPIC
Bilirubin, UA: NEGATIVE
Ketones, UA: NEGATIVE
Leukocytes,UA: NEGATIVE
Nitrite, UA: NEGATIVE
Protein,UA: NEGATIVE
RBC, UA: NEGATIVE
Specific Gravity, UA: 1.015 (ref 1.005–1.030)
Urobilinogen, Ur: 0.2 mg/dL (ref 0.2–1.0)
pH, UA: 6 (ref 5.0–7.5)

## 2024-02-29 MED ORDER — TAMSULOSIN HCL 0.4 MG PO CAPS: 0.4000 mg | ORAL_CAPSULE | Freq: Every morning | ORAL | 3 refills | Status: AC

## 2024-02-29 NOTE — Progress Notes (Signed)
 02/29/2024 10:39 AM   [name] 09-02-1945 [id number]  Referring provider: Cleotilde Planas, MD [address] [place],  [place] [address]  Followup BPh and erectile dysfunction   HPI: Brian Gallegos is a 77yo here for followup for BPH and erectile dysfunction. IPSS 4 QOL 0 after urolift and currently on flomax . [place] stream strong. No straining to urinate. Nocturia 2x. He continues to have issues getting an erection. He is not interested in further therapy    PMH: Past Medical History:  Diagnosis Date   Arthritis    all over   Cancer [facility])    Skin Cancer removed off back   CHF (congestive heart failure) (HCC)    Chronic lower back pain    Coronary artery disease    DES pRCA 10/12/15   Early cataracts, bilateral    Family history of adverse reaction to anesthesia    son had ligament repair of elbow in 2016; woke up nauseated and disoriented   Hypercholesterolemia    Hypertension    Migraine    Restless leg syndrome    Spinal stenosis in cervical region 12/03/2023   Wears glasses     Surgical History: Past Surgical History:  Procedure Laterality Date   ANTERIOR CERVICAL DECOMP/DISCECTOMY FUSION  ~ 2005   ANTERIOR CERVICAL DISCECTOMY     BACK SURGERY     CARDIAC CATHETERIZATION N/A 10/12/2015   Procedure: Left Heart Cath and Coronary Angiography;  Surgeon: Gordy Bergamo, MD;  Location: [facility] INVASIVE CV LAB;  Service: Cardiovascular;  Laterality: N/A;   CARDIAC CATHETERIZATION  10/12/2015   Procedure: Coronary Stent Intervention;  Surgeon: Gordy Bergamo, MD;  Location: [facility] INVASIVE CV LAB;  Service: Cardiovascular;;   COLONOSCOPY     CORONARY ANGIOPLASTY  10/12/2015   DES RCA   CYSTOSCOPY WITH INSERTION OF UROLIFT N/A 05/02/2021   Procedure: CYSTOSCOPY WITH INSERTION OF UROLIFT;  Surgeon: Sherrilee Belvie CROME, MD;  Location: [facility] ORS;  Service: Urology;  Laterality: N/A;   EYE SURGERY Left    cataract   FRACTURE SURGERY     nose repaired from accident   GLAUCOMA SURGERY  Left 03/04/2015   JOINT REPLACEMENT     KNEE ARTHROSCOPY Bilateral    LEFT HEART CATH AND CORONARY ANGIOGRAPHY N/A 01/17/2022   Procedure: LEFT HEART CATH AND CORONARY ANGIOGRAPHY;  Surgeon: Bergamo Gordy, MD;  Location: [facility] INVASIVE CV LAB;  Service: Cardiovascular;  Laterality: N/A;   LUMBAR DISC SURGERY  ~ 1986-02/08/2007 X 6   MAXIMUM ACCESS (MAS)POSTERIOR LUMBAR INTERBODY FUSION (PLIF) 3 LEVEL  02/08/2007   w/rods and screws   RIGHT HEART CATH N/A 02/21/2022   Procedure: RIGHT HEART CATH;  Surgeon: Rolan Ezra RAMAN, MD;  Location: [facility] INVASIVE CV LAB;  Service: Cardiovascular;  Laterality: N/A;   SPINAL CORD STIMULATOR INSERTION N/A 05/08/2014   Procedure: LUMBAR SPINAL CORD STIMULATOR IMPLANT ;  Surgeon: Deward JAYSON Fabian, MD;  Location: [facility] NEURO ORS;  Service: Neurosurgery;  Laterality: N/A;   TOE FUSION Right 06/02/2014   outside part of big toe   TOTAL KNEE ARTHROPLASTY Left ~ 2011   TOTAL KNEE ARTHROPLASTY Right 11/21/2022   Procedure: RIGHT TOTAL KNEE ARTHROPLASTY;  Surgeon: Vernetta Lonni GRADE, MD;  Location: [facility] OR;  Service: Orthopedics;  Laterality: Right;    Home Medications:  Allergies as of 02/29/2024       Reactions   Dulcolax [bisacodyl] Other (See Comments)   Severe stomach cramps   Codeine Itching   Low amount is tolerated  Proscar [finasteride] Hives, Rash        Medication List        Accurate as of February 29, 2024 10:39 AM. If you have any questions, ask your nurse or doctor.          Advil Dual Action 125-250 MG Tabs Generic drug: Ibuprofen-Acetaminophen  Take 2 tablets by mouth every 8 (eight) hours as needed (pain).   aspirin  81 MG chewable tablet CHEW AND SWALLOW 1 TABLET(81 MG) BY MOUTH TWICE DAILY   bisoprolol  5 MG tablet Commonly known as: ZEBETA  Take 0.5 tablets (2.5 mg total) by mouth daily.   Cholecalciferol  25 MCG (1000 UT) tablet Take 1,000 Units by mouth in the morning.   ciprofloxacin  500 MG tablet Commonly known as: CIPRO  Take  500 mg by mouth 2 (two) times daily.   diclofenac  Sodium 1 % Gel Commonly known as: VOLTAREN  Apply 1 Application topically as needed (pain).   DULoxetine  20 MG capsule Commonly known as: CYMBALTA  TAKE 1 CAPSULE(20 MG) BY MOUTH DAILY   empagliflozin  10 MG Tabs tablet Commonly known as: Jardiance  TAKE 1 TABLET(10 MG) BY MOUTH EVERY MORNING   furosemide  40 MG tablet Commonly known as: LASIX  Take 0.5 tablets (20 mg total) by mouth daily.   gabapentin  300 MG capsule Commonly known as: NEURONTIN  TAKE 1 CAPSULE(300 MG) BY MOUTH AT BEDTIME   methocarbamol  500 MG tablet Commonly known as: ROBAXIN  TAKE 1 TABLET(500 MG) BY MOUTH EVERY 6 HOURS AS NEEDED FOR MUSCLE SPASMS   oxymetazoline 0.05 % nasal spray Commonly known as: AFRIN Place 1 spray into both nostrils 2 (two) times daily as needed for congestion.   pyridoxine  100 MG tablet Commonly known as: B-6 Take 100 mg by mouth in the morning.   rOPINIRole  2 MG tablet Commonly known as: REQUIP  Take 4 mg by mouth See admin instructions. Take 4 mg in the afternoon and 4 mg at bedtime, may take a third 4 mg dose as needed for restless legs   rosuvastatin  40 MG tablet Commonly known as: CRESTOR  Take 1 tablet (40 mg total) by mouth at bedtime.   sacubitril -valsartan  97-103 MG Commonly known as: ENTRESTO  Take 1 tablet by mouth 2 (two) times daily.   spironolactone  25 MG tablet Commonly known as: ALDACTONE  TAKE 1 TABLET(25 MG) BY MOUTH DAILY   tadalafil  5 MG tablet Commonly known as: CIALIS  Take 1 tablet (5 mg total) by mouth at bedtime.   tadalafil  20 MG tablet Commonly known as: CIALIS  TAKE 1 TABLET BY MOUTH ONCE DAILY AS NEEDED FOR ERECTILE DYSFUNCTION   tamsulosin  0.4 MG Caps capsule Commonly known as: FLOMAX  Take 1 capsule (0.4 mg total) by mouth in the morning.   traZODone  100 MG tablet Commonly known as: DESYREL  Take 100 mg by mouth at bedtime.   vitamin C  250 MG tablet Commonly known as: ASCORBIC ACID  Take 250  mg by mouth daily.        Allergies:  Allergies  Allergen Reactions   Dulcolax [Bisacodyl] Other (See Comments)    Severe stomach cramps   Codeine Itching    Low amount is tolerated   Proscar [Finasteride] Hives and Rash    Family History: Family History  Problem Relation Age of Onset   Aneurysm Mother    Heart attack Father    Hypertension Father    CAD Father    Cancer Other     Social History:  reports that he has never smoked. He has never used smokeless tobacco. He reports current alcohol use  of about 4.0 - 5.0 standard drinks of alcohol per week. He reports that he does not use drugs.  ROS: All other review of systems were reviewed and are negative except what is noted above in HPI  Physical Exam: BP (!) 105/52   Pulse 60   Constitutional:  Alert and oriented, No acute distress. HEENT: [place] AT, moist mucus membranes.  Trachea midline, no masses. Cardiovascular: No clubbing, cyanosis, or edema. Respiratory: Normal respiratory effort, no increased work of breathing. GI: Abdomen is soft, nontender, nondistended, no abdominal masses GU: No CVA tenderness.  Lymph: No cervical or inguinal lymphadenopathy. Skin: No rashes, bruises or suspicious lesions. Neurologic: Grossly intact, no focal deficits, moving all 4 extremities. Psychiatric: Normal mood and affect.  Laboratory Data: Lab Results  Component Value Date   WBC 9.5 12/06/2022   HGB 10.9 (L) 12/06/2022   HCT 33.0 (L) 12/06/2022   MCV 95.4 12/06/2022   PLT 529 (H) 12/06/2022    Lab Results  Component Value Date   CREATININE 1.07 11/27/2023    No results found for: PSA  Lab Results  Component Value Date   TESTOSTERONE 611 06/15/2020    Lab Results  Component Value Date   HGBA1C  04/20/2010    5.6 (NOTE)                                                                       According to the ADA Clinical Practice Recommendations for 2011, when HbA1c is used as a screening test:   >=6.5%    Diagnostic of Diabetes Mellitus           (if abnormal result  is confirmed)  5.7-6.4%   Increased risk of developing Diabetes Mellitus  References:Diagnosis and Classification of Diabetes Mellitus,Diabetes Care,2011,34(Suppl 1):S62-S69 and Standards of Medical Care in         Diabetes - 2011,Diabetes Care,2011,34  (Suppl 1):S11-S61.    Urinalysis    Component Value Date/Time   COLORURINE YELLOW 11/24/2022 2033   APPEARANCEUR Clear 12/03/2023 1126   LABSPEC 1.012 11/24/2022 2033   PHURINE 5.0 11/24/2022 2033   GLUCOSEU 3+ (A) 12/03/2023 1126   HGBUR NEGATIVE 11/24/2022 2033   BILIRUBINUR Negative 12/03/2023 1126   KETONESUR NEGATIVE 11/24/2022 2033   PROTEINUR Trace 12/03/2023 1126   PROTEINUR NEGATIVE 11/24/2022 2033   UROBILINOGEN 0.2 04/21/2010 1203   NITRITE Negative 12/03/2023 1126   NITRITE NEGATIVE 11/24/2022 2033   LEUKOCYTESUR Negative 12/03/2023 1126   LEUKOCYTESUR NEGATIVE 11/24/2022 2033    Lab Results  Component Value Date   LABMICR Comment 12/03/2023   WBCUA 11-30 (A) 01/28/2021   LABEPIT 0-10 01/28/2021   MUCUS Present 01/28/2021   BACTERIA RARE (A) 11/24/2022    Pertinent Imaging:  No results found for this or any previous visit.  No results found for this or any previous visit.  No results found for this or any previous visit.  No results found for this or any previous visit.  No results found for this or any previous visit.  No results found for this or any previous visit.  No results found for this or any previous visit.  Results for orders placed during the hospital encounter of 12/05/23  CT RENAL STONE STUDY  Narrative EXAMINATION:  CT RENAL STONE STUDY  CLINICAL INDICATION: Male, 78 years old. Abdominal/flank pain, stone suspected  TECHNIQUE: Helical CT scan examination of the abdomen and pelvis is performed from the domes of the diaphragm to the pubic symphysis. Limited evaluation of the solid organs due to lack of intravenous  contrast. Unless otherwise specified, incidental thyroid , adrenal, renal lesions do not require dedicated imaging follow up. Additionally, any mentioned pulmonary nodules do not require dedicated imaging follow-up based on the Fleischner guidelines unless otherwise specified. Coronary calcifications are not identified unless otherwise specified.  COMPARISON:  FINDINGS:  There are minimal atelectatic changes of the lung bases. The heart is borderline enlarged. There are coronary calcifications. The liver appears normal. The gallbladder is normal. The spleen is normal. The pancreas is normal. The adrenals are normal. The right kidney is normal. 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 There is a dorsal column stimulator partially imaged. The abdominal aorta is borderline aneurysmal at the level of the right renal artery. Scattered calcified atherosclerotic changes are present. The bladder is normal. The prostate is mildly enlarged with postoperative changes. Large and small bowel loops are otherwise within normal limits. No free fluid or pathologic lymphadenopathy by size criteria. There are degenerative changes of the spine and bony pelvis. There is diffuse osseous demineralization. Lumbar fusion noted.  IMPRESSION:  No acute findings in the abdomen or pelvis. No renal calculi or hydronephrosis.  Various findings as above.  DOSE REDUCTION: This exam was performed according to our departmental dose-optimization program which includes automated exposure control, adjustment of the mA and/or kV according to patient size and/or use of iterative reconstruction technique.  Electronically signed by: [place] Engel MD 12/05/2023 06:36 PM EDT RP Workstation: [id number]   Assessment & Plan:    1. Benign prostatic hyperplasia with urinary obstruction (Primary) -continue flomax  0.4mg  daily - Urinalysis, Routine w reflex microscopic  2. Urinary  retention resolved  3. Erectile dysfunction due to arterial insufficiency -patient defers therapy therapy at this timer   No follow-ups on file.  Belvie Clara, MD  [facility] [place]

## 2024-02-29 NOTE — Patient Instructions (Signed)

## 2024-03-12 ENCOUNTER — Other Ambulatory Visit: Payer: Self-pay | Admitting: Urology

## 2024-03-19 ENCOUNTER — Ambulatory Visit (HOSPITAL_COMMUNITY)
Admission: RE | Admit: 2024-03-19 | Discharge: 2024-03-19 | Disposition: A | Discharge status: Home / Self Care | Source: Ambulatory Visit | Attending: Internal Medicine | Admitting: Internal Medicine

## 2024-03-19 ENCOUNTER — Ambulatory Visit (HOSPITAL_COMMUNITY): Payer: Self-pay | Admitting: Cardiology

## 2024-03-19 DIAGNOSIS — I5042 Chronic combined systolic (congestive) and diastolic (congestive) heart failure: Secondary | ICD-10-CM | POA: Insufficient documentation

## 2024-03-19 LAB — BASIC METABOLIC PANEL WITH GFR
Anion gap: 11 (ref 5–15)
BUN: 13 mg/dL (ref 8–23)
CO2: 27 mmol/L (ref 22–32)
Calcium: 9.4 mg/dL (ref 8.9–10.3)
Chloride: 103 mmol/L (ref 98–111)
Creatinine, Ser: 1.04 mg/dL (ref 0.61–1.24)
GFR, Estimated: >60 mL/min (ref >60)
Glucose, Bld: 106 mg/dL — ABNORMAL HIGH (ref 70–99)
Potassium: 4.3 mmol/L (ref 3.5–5.1)
Sodium: 141 mmol/L (ref 135–145)

## 2024-03-19 LAB — LIPID PANEL
Cholesterol: 161 mg/dL (ref 0–200)
HDL: 61 mg/dL (ref >40)
LDL Cholesterol: 86 mg/dL (ref 0–99)
Total CHOL/HDL Ratio: 2.6 ratio
Triglycerides: 72 mg/dL (ref <150)
VLDL: 14 mg/dL (ref 0–40)

## 2024-03-19 LAB — BRAIN NATRIURETIC PEPTIDE: B Natriuretic Peptide: 303.1 pg/mL — ABNORMAL HIGH (ref 0.0–100.0)

## 2024-03-21 MED ORDER — EZETIMIBE 10 MG PO TABS: 10.0000 mg | ORAL_TABLET | Freq: Every day | ORAL | 3 refills | Status: AC

## 2024-03-27 ENCOUNTER — Other Ambulatory Visit (HOSPITAL_COMMUNITY): Payer: Self-pay | Admitting: Cardiology

## 2024-03-27 DIAGNOSIS — I255 Ischemic cardiomyopathy: Secondary | ICD-10-CM

## 2024-03-27 DIAGNOSIS — I119 Hypertensive heart disease without heart failure: Secondary | ICD-10-CM

## 2024-03-28 DIAGNOSIS — Z1211 Encounter for screening for malignant neoplasm of colon: Secondary | ICD-10-CM | POA: Diagnosis not present

## 2024-03-28 DIAGNOSIS — I5042 Chronic combined systolic (congestive) and diastolic (congestive) heart failure: Secondary | ICD-10-CM | POA: Diagnosis not present

## 2024-03-28 DIAGNOSIS — I2510 Atherosclerotic heart disease of native coronary artery without angina pectoris: Secondary | ICD-10-CM | POA: Diagnosis not present

## 2024-04-01 ENCOUNTER — Other Ambulatory Visit: Payer: Self-pay | Admitting: Urology

## 2024-04-14 ENCOUNTER — Emergency Department (HOSPITAL_COMMUNITY)
Admission: EM | Admit: 2024-04-14 | Discharge: 2024-04-15 | Disposition: A | Discharge status: Home / Self Care | Attending: Emergency Medicine | Admitting: Emergency Medicine

## 2024-04-14 ENCOUNTER — Encounter (HOSPITAL_COMMUNITY): Payer: Self-pay | Admitting: Emergency Medicine

## 2024-04-14 ENCOUNTER — Emergency Department (HOSPITAL_COMMUNITY)

## 2024-04-14 ENCOUNTER — Other Ambulatory Visit: Payer: Self-pay

## 2024-04-14 DIAGNOSIS — R079 Chest pain, unspecified: Secondary | ICD-10-CM

## 2024-04-14 DIAGNOSIS — Z7982 Long term (current) use of aspirin: Secondary | ICD-10-CM | POA: Insufficient documentation

## 2024-04-14 DIAGNOSIS — I509 Heart failure, unspecified: Secondary | ICD-10-CM | POA: Diagnosis not present

## 2024-04-14 DIAGNOSIS — I110 Hypertensive heart disease with heart failure: Secondary | ICD-10-CM | POA: Diagnosis not present

## 2024-04-14 DIAGNOSIS — R059 Cough, unspecified: Secondary | ICD-10-CM | POA: Diagnosis not present

## 2024-04-14 DIAGNOSIS — R0789 Other chest pain: Secondary | ICD-10-CM | POA: Diagnosis not present

## 2024-04-14 DIAGNOSIS — Z7984 Long term (current) use of oral hypoglycemic drugs: Secondary | ICD-10-CM | POA: Diagnosis not present

## 2024-04-14 LAB — D-DIMER, QUANTITATIVE: D-Dimer, Quant: 0.35 ug{FEU}/mL (ref 0.00–0.50)

## 2024-04-14 LAB — TROPONIN I (HIGH SENSITIVITY): Troponin I (High Sensitivity): 23 ng/L — ABNORMAL HIGH (ref <18)

## 2024-04-14 LAB — CBC
HCT: 37.7 % — ABNORMAL LOW (ref 39.0–52.0)
Hemoglobin: 12.5 g/dL — ABNORMAL LOW (ref 13.0–17.0)
MCH: 32.1 pg (ref 26.0–34.0)
MCHC: 33.2 g/dL (ref 30.0–36.0)
MCV: 96.9 fL (ref 80.0–100.0)
Platelets: 201 K/uL (ref 150–400)
RBC: 3.89 MIL/uL — ABNORMAL LOW (ref 4.22–5.81)
RDW: 12.7 % (ref 11.5–15.5)
WBC: 6.1 K/uL (ref 4.0–10.5)
nRBC: 0 % (ref 0.0–0.2)

## 2024-04-14 LAB — BASIC METABOLIC PANEL WITH GFR
Anion gap: 9 (ref 5–15)
BUN: 18 mg/dL (ref 8–23)
CO2: 29 mmol/L (ref 22–32)
Calcium: 8.6 mg/dL — ABNORMAL LOW (ref 8.9–10.3)
Chloride: 100 mmol/L (ref 98–111)
Creatinine, Ser: 1.06 mg/dL (ref 0.61–1.24)
GFR, Estimated: >60 mL/min (ref >60)
Glucose, Bld: 107 mg/dL — ABNORMAL HIGH (ref 70–99)
Potassium: 3.6 mmol/L (ref 3.5–5.1)
Sodium: 138 mmol/L (ref 135–145)

## 2024-04-14 NOTE — ED Triage Notes (Signed)
 New onset chest pain, radiating to left arm. Pt took 324 aspirin  before EMS arrived. Upon arrival pain 4/10 resolved by nitroglycerin . Another nitroglycerin  was given en route to hospital.   Hx of stent and MI

## 2024-04-14 NOTE — ED Provider Notes (Signed)
 [place] EMERGENCY DEPARTMENT AT [facility] Provider Note   CSN: [id number] Arrival date & time: 04/14/24  2200     Patient presents with: Chest Pain   Brian Gallegos is a 78 y.o. male.   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 Pt admits to chest pain that began last night while at a concert, sharp in nature, left chest, with radiation to the left arm. States he took a tylenol  and it resolved. States he played golf today and started having the same chest pain several hours after finishing golf. Denies hx of heart burn/acid reflux. Denies abdominal pain or back pain. Denies fevers or chills. Admits to new intermittent cough.   The history is provided by the patient. No language interpreter was used.  Chest Pain Associated symptoms: cough   Associated symptoms: no abdominal pain, no back pain, no fever, no palpitations, no shortness of breath and no vomiting        Prior to Admission medications   Medication Sig Start Date End Date Taking? Authorizing Provider  aspirin  81 MG chewable tablet CHEW AND SWALLOW 1 TABLET(81 MG) BY MOUTH TWICE DAILY 12/18/22   Vernetta Lonni GRADE, MD  bisoprolol  (ZEBETA ) 5 MG tablet Take 0.5 tablets (2.5 mg total) by mouth daily. 03/21/23   Rolan Ezra RAMAN, MD  Cholecalciferol  25 MCG (1000 UT) tablet Take 1,000 Units by mouth in the morning.    [provider]  ciprofloxacin  (CIPRO ) 500 MG tablet Take 500 mg by mouth 2 (two) times daily. 11/30/23   [provider]  diclofenac  Sodium (VOLTAREN ) 1 % GEL Apply 1 Application topically as needed (pain).    [provider]  DULoxetine  (CYMBALTA ) 20 MG capsule TAKE 1 CAPSULE(20 MG) BY MOUTH DAILY 12/03/21   Nitka, James E, MD  empagliflozin  (JARDIANCE ) 10 MG TABS tablet TAKE 1 TABLET(10 MG) BY MOUTH EVERY MORNING 11/14/23   Rolan Ezra RAMAN, MD  ezetimibe   (ZETIA ) 10 MG tablet Take 1 tablet (10 mg total) by mouth daily. 03/21/24 06/19/24  Rolan Ezra RAMAN, MD  furosemide  (LASIX ) 40 MG tablet Take 0.5 tablets (20 mg total) by mouth daily. 01/16/24   Rolan Ezra RAMAN, MD  gabapentin  (NEURONTIN ) 300 MG capsule TAKE 1 CAPSULE(300 MG) BY MOUTH AT BEDTIME 06/19/23   Vernetta Lonni GRADE, MD  Ibuprofen-Acetaminophen  (ADVIL DUAL ACTION) 125-250 MG TABS Take 2 tablets by mouth every 8 (eight) hours as needed (pain).    [provider]  methocarbamol  (ROBAXIN ) 500 MG tablet TAKE 1 TABLET(500 MG) BY MOUTH EVERY 6 HOURS AS NEEDED FOR MUSCLE SPASMS 09/13/23   Vernetta Lonni GRADE, MD  oxymetazoline (AFRIN) 0.05 % nasal spray Place 1 spray into both nostrils 2 (two) times daily as needed for congestion.    [provider]  pyridoxine  (B-6) 100 MG tablet Take 100 mg by mouth in the morning.    [provider]  rOPINIRole  (REQUIP ) 2 MG tablet Take 4 mg by mouth See admin instructions. Take 4 mg in the afternoon and 4 mg at bedtime, may take a third 4 mg dose as needed for restless legs    [provider]  rosuvastatin  (CRESTOR ) 40 MG tablet Take 1 tablet (40 mg total) by mouth at bedtime. 01/16/24   Rolan Ezra RAMAN, MD  sacubitril -valsartan  (ENTRESTO ) 97-103 MG Take 1 tablet by mouth 2 (two) times daily. 01/16/24  Rolan Ezra RAMAN, MD  spironolactone  (ALDACTONE ) 25 MG tablet TAKE 1 TABLET(25 MG) BY MOUTH DAILY 03/28/24   Rolan Ezra RAMAN, MD  tadalafil  (CIALIS ) 20 MG tablet TAKE 1 TABLET BY MOUTH ONCE DAILY AS NEEDED FOR ERECTILE DYSFUNCTION 03/14/24   Sherrilee Belvie CROME, MD  tadalafil  (CIALIS ) 5 MG tablet TAKE 1 TABLET BY MOUTH AT BEDTIME 04/08/24   McKenzie, Belvie CROME, MD  tamsulosin  (FLOMAX ) 0.4 MG CAPS capsule Take 1 capsule (0.4 mg total) by mouth in the morning. 02/29/24   McKenzie, Belvie CROME, MD  traZODone  (DESYREL ) 100 MG tablet Take 100 mg by mouth at bedtime.    [provider]  vitamin C  (ASCORBIC ACID ) 250 MG  tablet Take 250 mg by mouth daily.    [provider]    Allergies: Dulcolax [bisacodyl], Codeine, and Proscar [finasteride]    Review of Systems  Constitutional:  Negative for chills and fever.  HENT:  Negative for ear pain and sore throat.   Eyes:  Negative for pain and visual disturbance.  Respiratory:  Positive for cough. Negative for shortness of breath.   Cardiovascular:  Positive for chest pain. Negative for palpitations.  Gastrointestinal:  Negative for abdominal pain and vomiting.  Genitourinary:  Negative for dysuria and hematuria.  Musculoskeletal:  Negative for arthralgias and back pain.  Skin:  Negative for color change and rash.  Neurological:  Negative for seizures and syncope.  All other systems reviewed and are negative.   Updated Vital Signs BP 132/67   Pulse 60   Temp 97.6 F (36.4 C) (Oral)   Resp 17   Ht 5' 9 (1.753 m)   Wt 91.6 kg   SpO2 96%   BMI 29.83 kg/m   Physical Exam Vitals and nursing note reviewed.  Constitutional:      General: He is not in acute distress.    Appearance: He is well-developed.  HENT:     Head: Normocephalic and atraumatic.  Eyes:     Conjunctiva/sclera: Conjunctivae normal.  Cardiovascular:     Rate and Rhythm: Normal rate and regular rhythm.     Heart sounds: No murmur heard. Pulmonary:     Effort: Pulmonary effort is normal. No respiratory distress.     Breath sounds: Normal breath sounds.  Abdominal:     Palpations: Abdomen is soft.     Tenderness: There is no abdominal tenderness.  Musculoskeletal:        General: No swelling.     Cervical back: Neck supple.  Skin:    General: Skin is warm and dry.     Capillary Refill: Capillary refill takes less than 2 seconds.  Neurological:     Mental Status: He is alert.  Psychiatric:        Mood and Affect: Mood normal.     (all labs ordered are listed, but only abnormal results are displayed) Labs Reviewed  CBC - Abnormal; Notable for the following  components:      Result Value   RBC 3.89 (*)    Hemoglobin 12.5 (*)    HCT 37.7 (*)    All other components within normal limits  BASIC METABOLIC PANEL WITH GFR  TROPONIN I (HIGH SENSITIVITY)    EKG: EKG Interpretation Date/Time:  Monday April 14 2024 22:07:44 EDT Ventricular Rate:  65 PR Interval:  190 QRS Duration:  122 QT Interval:  437 QTC Calculation: 455 R Axis:   16  Text Interpretation: Sinus rhythm Nonspecific intraventricular conduction delay Confirmed by Brian Hila (  [id number]) on 04/14/2024 10:25:56 PM  Radiology: DG Chest 2 View Result Date: 04/14/2024 CLINICAL DATA:  Chest pain EXAM: CHEST - 2 VIEW COMPARISON:  11/24/2022 FINDINGS: Heart and mediastinal contours are within normal limits. No focal opacities or effusions. No acute bony abnormality. IMPRESSION: No active cardiopulmonary disease. Electronically Signed   By: Franky Crease M.D.   On: 04/14/2024 22:39     Procedures   Medications Ordered in the ED - No data to display                                  Medical Decision Making Amount and/or Complexity of Data Reviewed Labs: ordered. Radiology: ordered.  Risk Prescription drug management.   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  Patient is alert and oriented x 3, no acute distress, afebrile, stable vital signs.  He has equal bilateral breath sounds with no adventitious lung sounds.  Chest pain is not reproducible.  No chest wall rashes.  The patient's chest pain is not suggestive of pulmonary embolus, cardiac ischemia, aortic dissection, pericarditis, myocarditis, pulmonary embolism, pneumothorax, pneumonia, Zoster, or esophageal perforation, or other serious etiology.  Historically not abrupt in onset, tearing or ripping, pulses symmetric. EKG nonspecific for ischemia/infarction. No dysrhythmias, brugada, WPW, prolonged QT noted.  Labs and cxr  pending. Pt signed out to oncoming ED provider.      Final diagnoses:  Chest pain, unspecified type    ED Discharge Orders     None          Brian Bernarda SQUIBB, DO 04/25/24 1353

## 2024-04-14 NOTE — ED Notes (Signed)
Returned from XR 

## 2024-04-15 LAB — TROPONIN I (HIGH SENSITIVITY): Troponin I (High Sensitivity): 22 ng/L — ABNORMAL HIGH (ref <18)

## 2024-04-15 MED ORDER — NITROGLYCERIN 0.4 MG SL SUBL: 0.4000 mg | SUBLINGUAL_TABLET | SUBLINGUAL | 0 refills | Status: AC | PRN

## 2024-04-15 NOTE — ED Provider Notes (Signed)
 Patient signed out to me by Dr. Elnor.  Patient with second troponin pending.  Patient with history of stent to the right coronary in 2017.  He had onset of pain at rest tonight that did radiate to the left arm.  Patient received nitroglycerin  and has been pain-free throughout the evaluation here in the emergency department.  Upon recheck he is resting comfortably with no complaints.  Vital signs are normal.  Second troponin flat.  Reviewing records reveals that he had a cardiac catheterization in 2023 which showed his stent was patent without any other significant disease.  At this point I feel that the patient is safe for discharge and follow-up with his cardiologist in the office.   Haze Lonni PARAS, MD 04/15/24 [phone]

## 2024-04-28 ENCOUNTER — Telehealth (HOSPITAL_COMMUNITY): Payer: Self-pay

## 2024-04-28 NOTE — Progress Notes (Incomplete)
 ADVANCED HF CLINIC NOTE PCP: Cleotilde Planas, MD HF Cardiology: Dr. Rolan  Reason for Visit: Heart Failure Follow-up  HPI: Brian Gallegos is a 78 y.o. with history of HTN, hyperlipidemia, and CAD.  Patient had a cath in 4/17 with DES to 90% proximal RCA stenosis.  Echoes after that time showed mildly decreased systolic function in the 45-50% range.  He was generally asymptomatic, however, until around 6/23.  In 6/23, he developed progressive dyspnea and peripheral edema.  Echo in 6/23: EF 40-45%, mild LVH, GIIDD, RV normal, severe LAE, mild-mod MR.  He was started on Bumex  which gave him headaches, this was switched to Lasix .  Peripheral edema improved but he has continued to feel short of breath with exertion.  No definite trigger for symptoms, no episode of prolonged chest pain or severe viral-type illness.  He had coronary angiography in 7/23, showing patent RCA stent and nonobstructive mild CAD. He drank heavily in the past but not in the last 5 years (3-4 glasses wine/week now). He has a family history of CAD (father at around 59) but not cardiomyopathy or sudden cardiac death.  He was noted to be volume overloaded and Lasix  was increased.   RHC in 8/23 showed normal filling pressures on higher Lasix  dose. CPX in 8/23 showed low normal functional capacity, no more than mild functional limitation due to heart failure.  PFTs were within normal limits.   S/P R TKR in 5/24.  Echo 01/25/24 EF 60-65%, mild LVH, normal RV size and systolic function, RV normal.   Seen in the ED earlier this month with chest pain. Work up was negative.   Today he returns for AHF follow up. Overall feeling ***. Denies palpitations, CP, dizziness, edema, or PND/Orthopnea. *** SOB. Appetite ok. No fever or chills. Weight at home *** pounds. Taking all medications. Denies ETOH, tobacco or drug use. Plays golf.   ECG (personally reviewed): NSR, nonspecific T wave changes.   PMH: 1. HTN 2. Hyperlipidemia 3. CAD:  LHC in 4/17 with 90% pRCA treated with DES.  - LHC (7/23): Patent RCA stent, mild nonobstructive disease.  4. Chronic HF with mid range EF: ?Ischemic cardiomyopathy.  - Echo (3/18): EF 50%.  - Echo (7/21): EF 45-50%, septal HK, moderate MR - Echo (6/23): EF 40-45%, mild LVH, grade 2 diastolic dysfunction, RV normal, severe LAE, mild-moderate MR.  - RHC (8/23): mean RA 1, PA 33/13 mean 22, mean PCWP 11, CI 4.08 - CPX (8/23): peak VO2 18.9, VE/VCO2 slope 34, RER 1.22, normal PFTs => low normal functional status, no more than mild HF limitation.  - Echo (7/25): EF 60-65%, mild LVH, normal RV size and systolic function, RV normal.   SH: Married x 2, now widower.  Has 2 children.  Lives in [place].  Designer, Television/film Set.  Drinks 3-4 glasses wine/week. Nonsmoker though exposed to 2nd hand smoke.   FH: Father with MI in his 45s, mother with brain aneurysm in her 63s.   ROS: All systems reviewed and negative except as per HPI.   Current Outpatient Medications  Medication Sig Dispense Refill   aspirin  81 MG chewable tablet CHEW AND SWALLOW 1 TABLET(81 MG) BY MOUTH TWICE DAILY 30 tablet 0   bisoprolol  (ZEBETA ) 5 MG tablet Take 0.5 tablets (2.5 mg total) by mouth daily. 45 tablet 3   Cholecalciferol  25 MCG (1000 UT) tablet Take 1,000 Units by mouth in the morning.     ciprofloxacin  (CIPRO ) 500 MG tablet Take 500 mg by  mouth 2 (two) times daily.     diclofenac  Sodium (VOLTAREN ) 1 % GEL Apply 1 Application topically as needed (pain).     DULoxetine  (CYMBALTA ) 20 MG capsule TAKE 1 CAPSULE(20 MG) BY MOUTH DAILY 30 capsule 3   empagliflozin  (JARDIANCE ) 10 MG TABS tablet TAKE 1 TABLET(10 MG) BY MOUTH EVERY MORNING 30 tablet 11   ezetimibe  (ZETIA ) 10 MG tablet Take 1 tablet (10 mg total) by mouth daily. 90 tablet 3   furosemide  (LASIX ) 40 MG tablet Take 0.5 tablets (20 mg total) by mouth daily. 60 tablet 0   gabapentin  (NEURONTIN ) 300 MG capsule TAKE 1 CAPSULE(300 MG) BY MOUTH AT BEDTIME 60 capsule 1    Ibuprofen-Acetaminophen  (ADVIL DUAL ACTION) 125-250 MG TABS Take 2 tablets by mouth every 8 (eight) hours as needed (pain).     methocarbamol  (ROBAXIN ) 500 MG tablet TAKE 1 TABLET(500 MG) BY MOUTH EVERY 6 HOURS AS NEEDED FOR MUSCLE SPASMS 30 tablet 0   nitroGLYCERIN  (NITROSTAT ) 0.4 MG SL tablet Place 1 tablet (0.4 mg total) under the tongue every 5 (five) minutes as needed for chest pain. 30 tablet 0   oxymetazoline (AFRIN) 0.05 % nasal spray Place 1 spray into both nostrils 2 (two) times daily as needed for congestion.     pyridoxine  (B-6) 100 MG tablet Take 100 mg by mouth in the morning.     rOPINIRole  (REQUIP ) 2 MG tablet Take 4 mg by mouth See admin instructions. Take 4 mg in the afternoon and 4 mg at bedtime, may take a third 4 mg dose as needed for restless legs     rosuvastatin  (CRESTOR ) 40 MG tablet Take 1 tablet (40 mg total) by mouth at bedtime. 60 tablet 4   sacubitril -valsartan  (ENTRESTO ) 97-103 MG Take 1 tablet by mouth 2 (two) times daily. 60 tablet 2   spironolactone  (ALDACTONE ) 25 MG tablet TAKE 1 TABLET(25 MG) BY MOUTH DAILY 90 tablet 1   tadalafil  (CIALIS ) 20 MG tablet TAKE 1 TABLET BY MOUTH ONCE DAILY AS NEEDED FOR ERECTILE DYSFUNCTION 10 tablet 0   tadalafil  (CIALIS ) 5 MG tablet TAKE 1 TABLET BY MOUTH AT BEDTIME 30 tablet 0   tamsulosin  (FLOMAX ) 0.4 MG CAPS capsule Take 1 capsule (0.4 mg total) by mouth in the morning. 90 capsule 3   traZODone  (DESYREL ) 100 MG tablet Take 100 mg by mouth at bedtime.     vitamin C  (ASCORBIC ACID ) 250 MG tablet Take 250 mg by mouth daily.     No current facility-administered medications for this visit.   Wt Readings from Last 3 Encounters:  04/14/24 91.6 kg (202 lb)  01/16/24 90.4 kg (199 lb 3.2 oz)  11/27/23 92.5 kg (204 lb)   There were no vitals taken for this visit. General:  *** appearing.  No respiratory difficulty Neck: JVD *** cm.  Cor: Regular rate & rhythm. No murmurs. Lungs: clear Extremities: no edema  Neuro: alert &  oriented x 3. Affect pleasant.   Assessment/Plan: 1. Chronic HF with mid range EF:  Most recent echo in 6/23 showed EF 40-45%, mild LVH, grade 2 diastolic dysfunction, RV normal, severe LAE, mild-moderate MR.  He has a history of CAD with PCI to proximal RCA in 2017, but feel that CAD/ischemic cardiomyopathy is not a definite explanation for his cardiomyopathy/CHF. LHC in 7/23 showed patent RCA stent and nonobstructive mild CAD.  RHC in 8/23 after augmented diuresis showed normal filling pressures and cardiac output.  CPX in 8/23 showed only mild functional limitation from CHF.  Echo today showed EF up to 60-65%, mild LVH, normal RV size and systolic function, RV normal.  - NYHA class I, not volume overloaded on exam.  BP still high. *** - Increase Entresto  to 97/103 bid with BMET today and again in 10 days.  - Decrease Lasix  to 20 mg daily.  - Continue bisoprolol  2.5 mg daily.  - Continue Jardiance  10 mg daily.  - Continue spironolactone  25 mg daily.   2. CAD: 2017 PCI to RCA.  Cath 7/23 with patent RCA stent and nonobstructive mild disease.  No chest pain.  - Continue ASA 81  - Continue Crestor  40 mg daily for goal LDL < 55.  LDL 86 03/19/24. Zetia  10 mg daily added. Repeat lipids today vs f/u ***.   3. HTN: BP still elevated, increasing Entresto  as above.   Follow up in 6 months with APP. ***   Beckey LITTIE Coe, NP 04/28/2024

## 2024-04-28 NOTE — Telephone Encounter (Signed)
 Called to confirm/remind patient of their appointment at the [facility] on 04/29/24 9:00.   Appointment:   [x] Confirmed  [] Left mess   [] No answer/No voice mail  [] VM Full/unable to leave message  [] Phone not in service  Patient reminded to bring all medications and/or complete list.  Confirmed patient has transportation. Gave directions, instructed to utilize valet parking.

## 2024-04-29 ENCOUNTER — Ambulatory Visit (HOSPITAL_COMMUNITY)
Admission: RE | Admit: 2024-04-29 | Discharge: 2024-04-29 | Disposition: A | Discharge status: Home / Self Care | Source: Ambulatory Visit | Attending: Internal Medicine | Admitting: Internal Medicine

## 2024-04-29 ENCOUNTER — Other Ambulatory Visit: Payer: Self-pay | Admitting: Medical Genetics

## 2024-04-29 ENCOUNTER — Ambulatory Visit (HOSPITAL_COMMUNITY): Payer: Self-pay | Admitting: Internal Medicine

## 2024-04-29 ENCOUNTER — Encounter (HOSPITAL_COMMUNITY): Payer: Self-pay

## 2024-04-29 VITALS — BP 152/80 | HR 73 | Ht 69.0 in | Wt 209.6 lb

## 2024-04-29 DIAGNOSIS — Z7722 Contact with and (suspected) exposure to environmental tobacco smoke (acute) (chronic): Secondary | ICD-10-CM | POA: Diagnosis not present

## 2024-04-29 DIAGNOSIS — I255 Ischemic cardiomyopathy: Secondary | ICD-10-CM | POA: Insufficient documentation

## 2024-04-29 DIAGNOSIS — I2510 Atherosclerotic heart disease of native coronary artery without angina pectoris: Secondary | ICD-10-CM | POA: Diagnosis not present

## 2024-04-29 DIAGNOSIS — Z79899 Other long term (current) drug therapy: Secondary | ICD-10-CM | POA: Diagnosis not present

## 2024-04-29 DIAGNOSIS — R079 Chest pain, unspecified: Secondary | ICD-10-CM | POA: Diagnosis not present

## 2024-04-29 DIAGNOSIS — E785 Hyperlipidemia, unspecified: Secondary | ICD-10-CM | POA: Diagnosis not present

## 2024-04-29 DIAGNOSIS — I25119 Atherosclerotic heart disease of native coronary artery with unspecified angina pectoris: Secondary | ICD-10-CM | POA: Insufficient documentation

## 2024-04-29 DIAGNOSIS — Z7984 Long term (current) use of oral hypoglycemic drugs: Secondary | ICD-10-CM | POA: Insufficient documentation

## 2024-04-29 DIAGNOSIS — I10 Essential (primary) hypertension: Secondary | ICD-10-CM

## 2024-04-29 DIAGNOSIS — Z8249 Family history of ischemic heart disease and other diseases of the circulatory system: Secondary | ICD-10-CM | POA: Diagnosis not present

## 2024-04-29 DIAGNOSIS — Z955 Presence of coronary angioplasty implant and graft: Secondary | ICD-10-CM | POA: Insufficient documentation

## 2024-04-29 DIAGNOSIS — Z7982 Long term (current) use of aspirin: Secondary | ICD-10-CM | POA: Diagnosis not present

## 2024-04-29 DIAGNOSIS — Z006 Encounter for examination for normal comparison and control in clinical research program: Secondary | ICD-10-CM

## 2024-04-29 DIAGNOSIS — I5022 Chronic systolic (congestive) heart failure: Secondary | ICD-10-CM | POA: Insufficient documentation

## 2024-04-29 DIAGNOSIS — I110 Hypertensive heart disease with heart failure: Secondary | ICD-10-CM | POA: Insufficient documentation

## 2024-04-29 DIAGNOSIS — I5020 Unspecified systolic (congestive) heart failure: Secondary | ICD-10-CM

## 2024-04-29 LAB — BASIC METABOLIC PANEL WITH GFR
Anion gap: 9 (ref 5–15)
BUN: 9 mg/dL (ref 8–23)
CO2: 27 mmol/L (ref 22–32)
Calcium: 8.8 mg/dL — ABNORMAL LOW (ref 8.9–10.3)
Chloride: 103 mmol/L (ref 98–111)
Creatinine, Ser: 0.94 mg/dL (ref 0.61–1.24)
GFR, Estimated: >60 mL/min (ref >60)
Glucose, Bld: 111 mg/dL — ABNORMAL HIGH (ref 70–99)
Potassium: 3.8 mmol/L (ref 3.5–5.1)
Sodium: 139 mmol/L (ref 135–145)

## 2024-04-29 LAB — LIPID PANEL
Cholesterol: 115 mg/dL (ref 0–200)
HDL: 61 mg/dL (ref >40)
LDL Cholesterol: 40 mg/dL (ref 0–99)
Total CHOL/HDL Ratio: 1.9 ratio
Triglycerides: 69 mg/dL (ref <150)
VLDL: 14 mg/dL (ref 0–40)

## 2024-04-29 LAB — BRAIN NATRIURETIC PEPTIDE: B Natriuretic Peptide: 192.5 pg/mL — ABNORMAL HIGH (ref 0.0–100.0)

## 2024-04-29 MED ORDER — BISOPROLOL FUMARATE 5 MG PO TABS: 5.0000 mg | ORAL_TABLET | Freq: Every day | ORAL | 3 refills | Status: AC

## 2024-04-29 NOTE — Addendum Note (Signed)
 Encounter addended by: Hayes Beckey CROME, NP on: 04/29/2024 9:38 AM  Actions taken: Clinical Note Signed

## 2024-04-29 NOTE — Patient Instructions (Addendum)
 INCREASE Bisoprolol  to 5 mg daily.  Labs done today, your results will be available in MyChart, we will contact you for abnormal readings.    Please report to Radiology at the [facility] Main Entrance 30 minutes early for your test.  [address] [place], [place] [address]    How to Prepare for Your Cardiac PET/CT Stress Test:  Nothing to eat or drink, except water , 3 hours prior to arrival time.  NO caffeine/decaffeinated products, or chocolate 12 hours prior to arrival. (Please note decaffeinated beverages (teas/coffees) still contain caffeine).  If you have caffeine within 12 hours prior, the test will need to be rescheduled.  Medication instructions: Do not take erectile dysfunction medications for 72 hours prior to test (sildenafil, tadalafil ) Do not take nitrates (isosorbide  mononitrate, Ranexa) the day before or day of test Do not take tamsulosin  the day before or morning of test Hold theophylline containing medications for 12 hours. Hold Dipyridamole 48 hours prior to the test.  Diabetic Preparation: If able to eat breakfast prior to 3 hour fasting, you may take all medications, including your insulin. Do not worry if you miss your breakfast dose of insulin - start at your next meal. If you do not eat prior to 3 hour fast-Hold all diabetes (oral and insulin) medications. Patients who wear a continuous glucose monitor MUST remove the device prior to scanning.  You may take your remaining medications with water .  NO perfume, cologne or lotion on chest or abdomen area. FEMALES - Please avoid wearing dresses to this appointment.  Total time is 1 to 2 hours; you may want to bring reading material for the waiting time.  IF YOU THINK YOU MAY BE PREGNANT, OR ARE NURSING PLEASE INFORM THE TECHNOLOGIST.  In preparation for your appointment, medication and supplies will be purchased.  Appointment availability is limited, so if you need to cancel or reschedule, please  call the Radiology Department Scheduler at [phone] 24 hours in advance to avoid a cancellation fee of $100.00  What to Expect When you Arrive:  Once you arrive and check in for your appointment, you will be taken to a preparation room within the Radiology Department.  A technologist or Nurse will obtain your medical history, verify that you are correctly prepped for the exam, and explain the procedure.  Afterwards, an IV will be started in your arm and electrodes will be placed on your skin for EKG monitoring during the stress portion of the exam. Then you will be escorted to the PET/CT scanner.  There, staff will get you positioned on the scanner and obtain a blood pressure and EKG.  During the exam, you will continue to be connected to the EKG and blood pressure machines.  A small, safe amount of a radioactive tracer will be injected in your IV to obtain a series of pictures of your heart along with an injection of a stress agent.    After your Exam:  It is recommended that you eat a meal and drink a caffeinated beverage to counter act any effects of the stress agent.  Drink plenty of fluids for the remainder of the day and urinate frequently for the first couple of hours after the exam.  Your doctor will inform you of your test results within 7-10 business days.  For more information and frequently asked questions, please visit our website: [email or web address]  For questions about your test or how to prepare for your test, please call: Cardiac Imaging Nurse Navigators  Office: [phone]  Your physician recommends that you schedule a follow-up appointment as scheduled.  If you have any questions or concerns before your next appointment please send us  a message through [place] or call our office at [phone].    TO LEAVE A MESSAGE FOR THE NURSE SELECT OPTION 2, PLEASE LEAVE A MESSAGE INCLUDING: YOUR NAME DATE OF BIRTH CALL BACK NUMBER REASON FOR CALL**this is  important as we prioritize the call backs  YOU WILL RECEIVE A CALL BACK THE SAME DAY AS LONG AS YOU CALL BEFORE 4:00 PM  At the [facility], you and your health needs are our priority. As part of our continuing mission to provide you with exceptional heart care, we have created designated Provider Care Teams. These Care Teams include your primary Cardiologist (physician) and Advanced Practice Providers (APPs- Physician Assistants and Nurse Practitioners) who all work together to provide you with the care you need, when you need it.   You may see any of the following providers on your designated Care Team at your next follow up: Dr Toribio Fuel Dr Ezra Shuck Dr. Morene Brownie Greig Mosses, NP Caffie Shed, [place] [facility] [place], [place] Beckey Coe, NP Jordan Lee, NP Ellouise Class, NP Tinnie Redman, PharmD Jaun Bash, PharmD   Please be sure to bring in all your medications bottles to every appointment.    Thank you for choosing [facility]

## 2024-05-01 ENCOUNTER — Telehealth (HOSPITAL_COMMUNITY): Payer: Self-pay

## 2024-05-01 NOTE — Telephone Encounter (Signed)
  ADVANCED HEART FAILURE CLINIC   Pre-operative Risk Assessment   HEARTCARE STAFF-IMPORTANT INSTRUCTIONS 1 Red and Blue Text will auto delete once note is signed or closed. 2 Press F2 to navigate through template.   3 On drop down lists, L click to select >> R click to activate next field 4 Reason for Visit format is IMPORTANT!!  See Directions on No. 2 below. 5 Please review chart to determine if there is already a clearance note open for this procedure!!  DO NOT duplicate if a note already exists!!    :1}      Request for Surgical Clearance    Procedure:  Cataract Extraction  Date of Surgery:  Clearance 05/26/24                                 Surgeon:  Dr. Corbin Socks Group or Practice Name:  [facility] Phone number:  [phone] Fax number:  [phone]   Type of Clearance Requested:   - Medical    Type of Anesthesia:  IV sedation   Additional requests/questions:  Please fax a copy of Clearance  to the surgeon's office.  Signed, Lisa CHRISTELLA Sergeant   05/01/2024, 10:01 AM   [facility] Manuelita Dutch, PA-C [facility] [address] [facility] [place] [address] [phone] (office) [phone] (fax)

## 2024-05-01 NOTE — Telephone Encounter (Signed)
 Faxed to [facility] [address] [employer]

## 2024-05-05 ENCOUNTER — Encounter: Payer: Self-pay | Admitting: Radiology

## 2024-05-05 ENCOUNTER — Other Ambulatory Visit (HOSPITAL_COMMUNITY): Payer: Self-pay

## 2024-05-05 DIAGNOSIS — I5022 Chronic systolic (congestive) heart failure: Secondary | ICD-10-CM

## 2024-05-05 MED ORDER — SACUBITRIL-VALSARTAN 97-103 MG PO TABS: 1.0000 | ORAL_TABLET | Freq: Two times a day (BID) | ORAL | 5 refills | Status: AC

## 2024-05-05 MED ORDER — SACUBITRIL-VALSARTAN 97-103 MG PO TABS: 1.0000 | ORAL_TABLET | Freq: Two times a day (BID) | ORAL | 2 refills | Status: DC

## 2024-05-08 ENCOUNTER — Other Ambulatory Visit: Payer: Self-pay | Admitting: Urology

## 2024-05-08 DIAGNOSIS — N401 Enlarged prostate with lower urinary tract symptoms: Secondary | ICD-10-CM

## 2024-05-13 ENCOUNTER — Telehealth: Payer: Self-pay

## 2024-05-13 MED ORDER — TADALAFIL 5 MG PO TABS: 5.0000 mg | ORAL_TABLET | Freq: Every day | ORAL | 0 refills | Status: DC

## 2024-05-13 NOTE — Telephone Encounter (Signed)
 Dx changed to appropriate code. Pa no longer needed

## 2024-05-13 NOTE — Telephone Encounter (Signed)
-----   Message from Nurse [facility] C sent at 05/13/2024 12:40 PM EST ----- Possible duplicate

## 2024-05-13 NOTE — Addendum Note (Signed)
 Addended by: GRETTA MASTERS R on: 05/13/2024 03:51 PM   Modules accepted: Orders

## 2024-05-20 ENCOUNTER — Other Ambulatory Visit (HOSPITAL_COMMUNITY)

## 2024-05-21 ENCOUNTER — Encounter (HOSPITAL_COMMUNITY): Payer: Self-pay

## 2024-05-26 LAB — GENECONNECT MOLECULAR SCREEN: Genetic Analysis Overall Interpretation: NEGATIVE

## 2024-05-30 ENCOUNTER — Other Ambulatory Visit: Payer: Self-pay | Admitting: Urology

## 2024-06-04 ENCOUNTER — Ambulatory Visit (HOSPITAL_COMMUNITY)

## 2024-06-19 ENCOUNTER — Encounter (HOSPITAL_COMMUNITY): Payer: Self-pay

## 2024-06-24 ENCOUNTER — Ambulatory Visit (HOSPITAL_COMMUNITY)
Admission: RE | Admit: 2024-06-24 | Discharge: 2024-06-24 | Disposition: A | Discharge status: Home / Self Care | Source: Ambulatory Visit | Attending: Internal Medicine | Admitting: Internal Medicine

## 2024-06-24 DIAGNOSIS — I5020 Unspecified systolic (congestive) heart failure: Secondary | ICD-10-CM | POA: Diagnosis present

## 2024-06-24 LAB — NM PET CT CARDIAC PERFUSION MULTI W/ABSOLUTE BLOODFLOW
LV dias vol: 199 mL (ref 62–150)
MBFR: 2.26
Nuc Rest EF: 38 %
Nuc Stress EF: 45 %
Peak HR: 81 {beats}/min
Rest HR: 59 {beats}/min
Rest MBF: 0.72 ml/g/min
Rest Nuclear Isotope Dose: 24.6 mCi
ST Depression (mm): 0 mm
Stress MBF: 1.63 ml/g/min
Stress Nuclear Isotope Dose: 24 mCi
TID: 1.01

## 2024-06-24 MED ORDER — REGADENOSON 0.4 MG/5ML IV SOLN
0.4000 mg | Freq: Once | INTRAVENOUS | Status: AC
  Administered 2024-06-24: 0.4 mg via INTRAVENOUS

## 2024-06-24 MED ORDER — RUBIDIUM RB82 GENERATOR (RUBYFILL)
24.6000 | PACK | Freq: Once | INTRAVENOUS | Status: AC
  Administered 2024-06-24: 24.4 via INTRAVENOUS

## 2024-06-24 MED ORDER — REGADENOSON 0.4 MG/5ML IV SOLN
INTRAVENOUS | Status: AC
  Filled 2024-06-24: qty 5

## 2024-06-24 MED ORDER — RUBIDIUM RB82 GENERATOR (RUBYFILL)
24.6000 | PACK | Freq: Once | INTRAVENOUS | Status: AC
  Administered 2024-06-24: 24.6 via INTRAVENOUS

## 2024-06-24 NOTE — Progress Notes (Signed)
 Lexi scan tolerated well.

## 2024-06-30 ENCOUNTER — Ambulatory Visit (HOSPITAL_COMMUNITY)
Admission: RE | Admit: 2024-06-30 | Discharge: 2024-06-30 | Disposition: A | Discharge status: Home / Self Care | Source: Ambulatory Visit | Attending: Physician Assistant | Admitting: Physician Assistant

## 2024-06-30 ENCOUNTER — Encounter (HOSPITAL_COMMUNITY): Payer: Self-pay

## 2024-06-30 VITALS — BP 124/80 | HR 73 | Wt 208.6 lb

## 2024-06-30 DIAGNOSIS — Z79899 Other long term (current) drug therapy: Secondary | ICD-10-CM | POA: Diagnosis not present

## 2024-06-30 DIAGNOSIS — Z01810 Encounter for preprocedural cardiovascular examination: Secondary | ICD-10-CM

## 2024-06-30 DIAGNOSIS — Z96651 Presence of right artificial knee joint: Secondary | ICD-10-CM | POA: Diagnosis not present

## 2024-06-30 DIAGNOSIS — I10 Essential (primary) hypertension: Secondary | ICD-10-CM

## 2024-06-30 DIAGNOSIS — Z7984 Long term (current) use of oral hypoglycemic drugs: Secondary | ICD-10-CM | POA: Diagnosis not present

## 2024-06-30 DIAGNOSIS — Z955 Presence of coronary angioplasty implant and graft: Secondary | ICD-10-CM | POA: Diagnosis not present

## 2024-06-30 DIAGNOSIS — I2510 Atherosclerotic heart disease of native coronary artery without angina pectoris: Secondary | ICD-10-CM

## 2024-06-30 DIAGNOSIS — I5022 Chronic systolic (congestive) heart failure: Secondary | ICD-10-CM | POA: Diagnosis not present

## 2024-06-30 DIAGNOSIS — E785 Hyperlipidemia, unspecified: Secondary | ICD-10-CM | POA: Insufficient documentation

## 2024-06-30 DIAGNOSIS — Z7982 Long term (current) use of aspirin: Secondary | ICD-10-CM | POA: Insufficient documentation

## 2024-06-30 DIAGNOSIS — I110 Hypertensive heart disease with heart failure: Secondary | ICD-10-CM | POA: Diagnosis not present

## 2024-06-30 DIAGNOSIS — I129 Hypertensive chronic kidney disease with stage 1 through stage 4 chronic kidney disease, or unspecified chronic kidney disease: Secondary | ICD-10-CM | POA: Diagnosis not present

## 2024-06-30 NOTE — Progress Notes (Addendum)
 "  ADVANCED HF CLINIC NOTE PCP: Cleotilde Planas, MD HF Cardiology: Dr. Rolan  Reason for Visit: Heart Failure Follow-up  HPI: Brian Gallegos is a 78 y.o. with history of HTN, hyperlipidemia, and CAD.  Patient had a cath in 4/17 with DES to 90% proximal RCA stenosis.  Echoes after that time showed mildly decreased systolic function in the 45-50% range.  He was generally asymptomatic, however, until around 6/23.  In 6/23, he developed progressive dyspnea and peripheral edema.  Echo in 6/23: EF 40-45%, mild LVH, GIIDD, RV normal, severe LAE, mild-mod MR.  He was started on Bumex  which gave him headaches, this was switched to Lasix .  Peripheral edema improved but he has continued to feel short of breath with exertion.  No definite trigger for symptoms, no episode of prolonged chest pain or severe viral-type illness.  He had coronary angiography in 7/23, showing patent RCA stent and nonobstructive mild CAD. He drank heavily in the past but not in the last 5 years (3-4 glasses wine/week now). He has a family history of CAD (father at around 59) but not cardiomyopathy or sudden cardiac death.  He was noted to be volume overloaded and Lasix  was increased.   RHC in 8/23 showed normal filling pressures on higher Lasix  dose. CPX in 8/23 showed low normal functional capacity, no more than mild functional limitation due to heart failure.  PFTs were within normal limits.   S/P R TKR in 5/24.  Echo 01/25/24 EF 60-65%, mild LVH, normal RV size and systolic function, RV normal.   Seen in the ED 10/25 with chest pain and high blood pressure. Work up was negative.   Seen in clinic for follow-up in 10/25. Noted intermittent chest pain in setting of elevated blood pressures. Bisoprolol  increased. Cardiac stress PET with no evidence of ischemia, rest EF 38%.   Here today for CHF follow-up. Has not had any recurrent chest pain. Home blood pressures have been under better control since last visit. Gets short of breath  walking up an incline but this has not new. More likely to be limited by back pain than nay cardiac symptoms. No change in functional capacity. Golfs twice a week and walks 6,000-9,000 steps those days. No orthopnea, PND or lower extremity edema. Has gained ~10 lb since the summer months which he attributes to caloric intake.  Taking all medications as prescribed. Drinks a glass of red wine a couple nights a week. No tobacco use.    PMH: 1. HTN 2. Hyperlipidemia 3. CAD: LHC in 4/17 with 90% pRCA treated with DES.  - LHC (7/23): Patent RCA stent, mild nonobstructive disease.  4. Chronic HF with mid range EF: ?Ischemic cardiomyopathy.  - Echo (3/18): EF 50%.  - Echo (7/21): EF 45-50%, septal HK, moderate MR - Echo (6/23): EF 40-45%, mild LVH, grade 2 diastolic dysfunction, RV normal, severe LAE, mild-moderate MR.  - RHC (8/23): mean RA 1, PA 33/13 mean 22, mean PCWP 11, CI 4.08 - CPX (8/23): peak VO2 18.9, VE/VCO2 slope 34, RER 1.22, normal PFTs => low normal functional status, no more than mild HF limitation.  - Echo (7/25): EF 60-65%, mild LVH, normal RV size and systolic function, RV normal.   SH: Married x 2, now widower.  Has 2 children.  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   ROS: All systems reviewed and negative except as per HPI.  Current Outpatient Medications  Medication Sig Dispense Refill   aspirin  81 MG chewable tablet CHEW AND SWALLOW 1 TABLET(81 MG) BY MOUTH TWICE DAILY 30 tablet 0   bisoprolol  (ZEBETA ) 5 MG tablet Take 1 tablet (5 mg total) by mouth daily. 90 tablet 3   Cholecalciferol  25 MCG (1000 UT) tablet Take 1,000 Units by mouth in the morning.     diclofenac  Sodium (VOLTAREN ) 1 % GEL Apply 1 Application topically as needed (pain).     DULoxetine  (CYMBALTA ) 20 MG capsule TAKE 1 CAPSULE(20 MG) BY MOUTH DAILY 30 capsule 3   empagliflozin  (JARDIANCE ) 10 MG TABS tablet TAKE 1 TABLET(10 MG)  BY MOUTH EVERY MORNING 30 tablet 11   ezetimibe  (ZETIA ) 10 MG tablet Take 1 tablet (10 mg total) by mouth daily. 90 tablet 3   furosemide  (LASIX ) 40 MG tablet Take 0.5 tablets (20 mg total) by mouth daily. 60 tablet 0   Ibuprofen-Acetaminophen  (ADVIL DUAL ACTION) 125-250 MG TABS Take 2 tablets by mouth every 8 (eight) hours as needed (pain).     nitroGLYCERIN  (NITROSTAT ) 0.4 MG SL tablet Place 1 tablet (0.4 mg total) under the tongue every 5 (five) minutes as needed for chest pain. 30 tablet 0   oxymetazoline (AFRIN) 0.05 % nasal spray Place 1 spray into both nostrils 2 (two) times daily as needed for congestion.     pyridoxine  (B-6) 100 MG tablet Take 100 mg by mouth in the morning.     rOPINIRole  (REQUIP ) 2 MG tablet Take 4 mg by mouth See admin instructions. Take 4 mg in the afternoon and 4 mg at bedtime, may take a third 4 mg dose as needed for restless legs     rosuvastatin  (CRESTOR ) 40 MG tablet Take 1 tablet (40 mg total) by mouth at bedtime. 60 tablet 4   sacubitril -valsartan  (ENTRESTO ) 97-103 MG Take 1 tablet by mouth 2 (two) times daily. 60 tablet 5   spironolactone  (ALDACTONE ) 25 MG tablet TAKE 1 TABLET(25 MG) BY MOUTH DAILY 90 tablet 1   tadalafil  (CIALIS ) 20 MG tablet TAKE 1 TABLET BY MOUTH ONCE DAILY AS NEEDED FOR ERECTILE DYSFUNCTION 10 tablet 0   tadalafil  (CIALIS ) 5 MG tablet Take 1 tablet (5 mg total) by mouth at bedtime. 30 tablet 0   tamsulosin  (FLOMAX ) 0.4 MG CAPS capsule Take 1 capsule (0.4 mg total) by mouth in the morning. 90 capsule 3   traZODone  (DESYREL ) 100 MG tablet Take 100 mg by mouth at bedtime.     vitamin C  (ASCORBIC ACID ) 250 MG tablet Take 250 mg by mouth daily.     No current facility-administered medications for this encounter.   Wt Readings from Last 3 Encounters:  06/30/24 94.6 kg (208 lb 9.6 oz)  04/29/24 95.1 kg (209 lb 9.6 oz)  04/14/24 91.6 kg (202 lb)   BP 124/80   Pulse 73   Wt 94.6 kg (208 lb 9.6 oz)   SpO2 95%   BMI 30.80 kg/m  General:   Well appearing. Ambulated into clinic. Neck: no JVD.  Cor: Regular rate & rhythm. No murmurs. Lungs: clear Abdomen: soft, nontender, nondistended. Extremities: no edema Neuro: alert & orientedx3. Affect pleasant   Assessment/Plan: 1. Chronic HF with mid range EF:  Most recent echo in 6/23 showed EF 40-45%, mild LVH, grade 2 diastolic dysfunction, RV normal, severe LAE, mild-moderate MR.  He has a history of CAD with PCI to proximal RCA in 2017, but feel that CAD/ischemic cardiomyopathy is not a definite explanation for his cardiomyopathy/CHF.  LHC in 7/23 showed patent RCA stent and nonobstructive mild CAD.  RHC in 8/23 after augmented diuresis showed normal filling pressures and cardiac output.  CPX in 8/23 showed only mild functional limitation from CHF.  Echo 7/25 EF up to 60-65%, mild LVH, normal RV size and systolic function, RV normal. PET stress 12/25: no ischemia but EF appeared lower at 38%. Repeat echo to reassess LV function. - NYHA class I-II. Not volume overloaded. Do not think weight gain is due to volume overload. - Continue lasix  20 mg daily - Continue Entresto  97/103 bid.   - Continue bisoprolol  5 mg daily.  - Continue Jardiance  10 mg daily.  - Continue spironolactone  25 mg daily.  - BMET and BNP from 10/25 were reviewed (stable) 2. CAD: 2017 PCI to RCA.  Cath 7/23 with patent RCA stent and nonobstructive mild disease.   - LHC 7/23 as above.  - PET stress 12/25 with no evidence of ischemia, rest EF 38%, stress EF 45% - No recurrent chest pain. Suspect episodes of chest pain a couple of months ago may have been 2/2 uncontrolled HTN - Continue ASA 81  - Continue Crestor  40 mg + Zetia  10 mg. LDL 40 in 10/25, at goal 3. HTN: - BP control improved - Meds as above 4. Preoperative risk assessment: -He wants to undergo cataract surgery and is asking if his heart is okay for surgery. His risk of peri-operative cardiac complications is moderately increased in view of his history.  There is 5% risk of cardiac of major cardiac event using RCRI risk calculator. I do not see any contraindication to proceeding with surgery.  Follow up 6 months with Dr. Rolan   [facility], PA-C 06/30/2024 "

## 2024-06-30 NOTE — Patient Instructions (Signed)
 Medication Changes:  No Changes In Medications at this time.   Testing/Procedures:  ECHOCARDIOGRAM AS SCHEDULED   Follow-Up in: 6 MONTHS WITH DR. ROLAN PLEASE CALL OUR OFFICE AROUND APRIL 2026 TO GET SCHEDULED FOR YOUR APPOINTMENT. PHONE NUMBER IS [phone] OPTION 2   At the [facility], you and your health needs are our priority. We have a designated team specialized in the treatment of Heart Failure. This Care Team includes your primary Heart Failure Specialized Cardiologist (physician), Advanced Practice Providers (APPs- Physician Assistants and Nurse Practitioners), and Pharmacist who all work together to provide you with the care you need, when you need it.   You may see any of the following providers on your designated Care Team at your next follow up:  Dr. Toribio Fuel Dr. Ezra Rolan Dr. Odis Brownie Greig Mosses, NP Caffie Shed, [place] [facility] [place], [place] Beckey Coe, NP Jordan Lee, NP Tinnie Redman, PharmD   Please be sure to bring in all your medications bottles to every appointment.   Need to Contact Us :  If you have any questions or concerns before your next appointment please send us  a message through [place] or call our office at [phone].    TO LEAVE A MESSAGE FOR THE NURSE SELECT OPTION 2, PLEASE LEAVE A MESSAGE INCLUDING: YOUR NAME DATE OF BIRTH CALL BACK NUMBER REASON FOR CALL**this is important as we prioritize the call backs  YOU WILL RECEIVE A CALL BACK THE SAME DAY AS LONG AS YOU CALL BEFORE 4:00 PM

## 2024-07-15 ENCOUNTER — Telehealth (HOSPITAL_BASED_OUTPATIENT_CLINIC_OR_DEPARTMENT_OTHER): Payer: Self-pay

## 2024-07-15 NOTE — Telephone Encounter (Signed)
"  ° °  Pre-operative Risk Assessment    Patient Name: Brian Gallegos  DOB: Jan 29, 1946 MRN: [id number]   Date of last office visit: 06/30/2024 with Manuelita Nat Dutch, PA-C Date of next office visit: None  Request for Surgical Clearance    Procedure:  Cataract extraction by PE, IOL- right  Date of Surgery:  Clearance TBD                                 Surgeon:  Dr. Mabel Hum Surgeon's Group or Practice Name:  [facility]  Phone number:  [phone]  Fax number:  [phone]   Type of Clearance Requested:   - Medical  - Pharmacy:  Hold Aspirin  -does not specify   Type of Anesthesia:  IV sedation   Additional requests/questions:  Patient has known CAD s/p PCI in 2017 and was experiencing CP in the fall and in October was originally denied [facility] due to need for cardiac stress testing prior to surgery. Patient states he has since had this stress test and follow up.  SignedPatrcia Hong L   07/15/2024, 11:06 AM   "

## 2024-07-15 NOTE — Telephone Encounter (Signed)
"  ° °  Patient Name: Brian Gallegos  DOB: 1945/08/17 MRN: [id number]  Primary Cardiologist: Ezra Shuck, MD  Chart reviewed as part of pre-operative protocol coverage. Cataract extractions are recognized in guidelines as low risk surgeries that do not typically require specific preoperative testing or holding of blood thinner therapy. Therefore, given past medical history and time since last visit, based on ACC/AHA guidelines, CHRISANGEL ESKENAZI would be at acceptable risk for the planned procedure without further cardiovascular testing.   I will route this recommendation to the requesting party via Epic fax function and remove from pre-op pool.  Please call with questions.  Saddie GORMAN Cleaves, NP 07/15/2024, 11:23 AM  "

## 2024-07-16 ENCOUNTER — Ambulatory Visit (HOSPITAL_COMMUNITY)
Admission: RE | Admit: 2024-07-16 | Discharge: 2024-07-16 | Disposition: A | Discharge status: Home / Self Care | Source: Ambulatory Visit | Attending: *Deleted | Admitting: *Deleted

## 2024-07-16 DIAGNOSIS — I77810 Thoracic aortic ectasia: Secondary | ICD-10-CM | POA: Diagnosis not present

## 2024-07-16 DIAGNOSIS — I080 Rheumatic disorders of both mitral and aortic valves: Secondary | ICD-10-CM | POA: Insufficient documentation

## 2024-07-16 DIAGNOSIS — I110 Hypertensive heart disease with heart failure: Secondary | ICD-10-CM | POA: Diagnosis not present

## 2024-07-16 DIAGNOSIS — I5022 Chronic systolic (congestive) heart failure: Secondary | ICD-10-CM | POA: Diagnosis present

## 2024-07-16 LAB — ECHOCARDIOGRAM COMPLETE
Area-P 1/2: 2.47 cm2
S' Lateral: 4.2 cm

## 2024-07-17 ENCOUNTER — Ambulatory Visit (HOSPITAL_COMMUNITY): Payer: Self-pay | Admitting: Physician Assistant

## 2024-07-18 ENCOUNTER — Other Ambulatory Visit: Payer: Self-pay | Admitting: Urology

## 2024-07-18 DIAGNOSIS — N401 Enlarged prostate with lower urinary tract symptoms: Secondary | ICD-10-CM

## 2024-07-24 LAB — COLOGUARD: COLOGUARD: POSITIVE — AB

## 2024-07-25 ENCOUNTER — Telehealth (HOSPITAL_COMMUNITY): Payer: Self-pay | Admitting: *Deleted

## 2024-07-25 NOTE — Telephone Encounter (Signed)
 Received fax from [facility], pt needs clearance for cataract extration under IV sedation with Dr Grissom.  Pre-op risk was discussed at last OV in Dec, ov note faxed back to [phone]

## 2024-08-06 NOTE — Telephone Encounter (Signed)
 Patient returned call Aware of results Letter sent

## 2024-08-08 ENCOUNTER — Other Ambulatory Visit (HOSPITAL_COMMUNITY): Payer: Self-pay | Admitting: Cardiology

## 2024-09-24 ENCOUNTER — Ambulatory Visit (HOSPITAL_COMMUNITY): Admitting: Cardiology

## 2025-03-06 ENCOUNTER — Ambulatory Visit: Admitting: Urology
